# Patient Record
Sex: Female | Born: 1945 | Race: White | Hispanic: No | Smoking: Former smoker
Health system: Southern US, Community
[De-identification: ages and names within clinical notes are randomized; demographics above are authoritative.]

## PROBLEM LIST (undated history)

## (undated) DIAGNOSIS — I6783 Posterior reversible encephalopathy syndrome: Secondary | ICD-10-CM

## (undated) DIAGNOSIS — J449 Chronic obstructive pulmonary disease, unspecified: Secondary | ICD-10-CM

## (undated) DIAGNOSIS — I4891 Unspecified atrial fibrillation: Secondary | ICD-10-CM

## (undated) DIAGNOSIS — I10 Essential (primary) hypertension: Secondary | ICD-10-CM

## (undated) DIAGNOSIS — I639 Cerebral infarction, unspecified: Secondary | ICD-10-CM

## (undated) DIAGNOSIS — C801 Malignant (primary) neoplasm, unspecified: Secondary | ICD-10-CM

## (undated) DIAGNOSIS — J45909 Unspecified asthma, uncomplicated: Secondary | ICD-10-CM

## (undated) DIAGNOSIS — I499 Cardiac arrhythmia, unspecified: Secondary | ICD-10-CM

## (undated) HISTORY — PX: TONSILLECTOMY: SUR1361

## (undated) HISTORY — PX: OTHER SURGICAL HISTORY: SHX169

---

## 2000-02-03 ENCOUNTER — Emergency Department (HOSPITAL_COMMUNITY): Admission: EM | Admit: 2000-02-03 | Discharge: 2000-02-03 | Payer: Self-pay | Admitting: Emergency Medicine

## 2000-02-03 ENCOUNTER — Encounter: Payer: Self-pay | Admitting: Emergency Medicine

## 2002-06-13 ENCOUNTER — Emergency Department (HOSPITAL_COMMUNITY): Admission: EM | Admit: 2002-06-13 | Discharge: 2002-06-14 | Payer: Self-pay | Admitting: Emergency Medicine

## 2002-06-14 ENCOUNTER — Encounter: Payer: Self-pay | Admitting: Emergency Medicine

## 2004-07-24 ENCOUNTER — Ambulatory Visit (HOSPITAL_COMMUNITY): Admission: RE | Admit: 2004-07-24 | Discharge: 2004-07-24 | Payer: Self-pay | Admitting: Gastroenterology

## 2006-06-18 ENCOUNTER — Other Ambulatory Visit: Admission: RE | Admit: 2006-06-18 | Discharge: 2006-06-18 | Payer: Self-pay | Admitting: Family Medicine

## 2007-07-08 ENCOUNTER — Other Ambulatory Visit: Admission: RE | Admit: 2007-07-08 | Discharge: 2007-07-08 | Payer: Self-pay | Admitting: Family Medicine

## 2010-05-30 ENCOUNTER — Emergency Department (HOSPITAL_BASED_OUTPATIENT_CLINIC_OR_DEPARTMENT_OTHER)
Admission: EM | Admit: 2010-05-30 | Discharge: 2010-05-30 | Disposition: A | Discharge status: Home / Self Care | Payer: BC Managed Care – PPO | Attending: Emergency Medicine | Admitting: Emergency Medicine

## 2010-05-30 ENCOUNTER — Emergency Department (INDEPENDENT_AMBULATORY_CARE_PROVIDER_SITE_OTHER): Payer: BC Managed Care – PPO

## 2010-05-30 DIAGNOSIS — R509 Fever, unspecified: Secondary | ICD-10-CM

## 2010-05-30 DIAGNOSIS — R0609 Other forms of dyspnea: Secondary | ICD-10-CM | POA: Insufficient documentation

## 2010-05-30 DIAGNOSIS — I10 Essential (primary) hypertension: Secondary | ICD-10-CM | POA: Insufficient documentation

## 2010-05-30 DIAGNOSIS — R0989 Other specified symptoms and signs involving the circulatory and respiratory systems: Secondary | ICD-10-CM | POA: Insufficient documentation

## 2010-05-30 DIAGNOSIS — Z79899 Other long term (current) drug therapy: Secondary | ICD-10-CM | POA: Insufficient documentation

## 2010-05-30 DIAGNOSIS — R0602 Shortness of breath: Secondary | ICD-10-CM

## 2010-05-30 DIAGNOSIS — J069 Acute upper respiratory infection, unspecified: Secondary | ICD-10-CM | POA: Insufficient documentation

## 2010-05-30 DIAGNOSIS — R059 Cough, unspecified: Secondary | ICD-10-CM

## 2010-05-30 DIAGNOSIS — J45901 Unspecified asthma with (acute) exacerbation: Secondary | ICD-10-CM | POA: Insufficient documentation

## 2010-05-30 DIAGNOSIS — I4891 Unspecified atrial fibrillation: Secondary | ICD-10-CM | POA: Insufficient documentation

## 2010-05-30 DIAGNOSIS — R05 Cough: Secondary | ICD-10-CM

## 2010-05-30 DIAGNOSIS — J40 Bronchitis, not specified as acute or chronic: Secondary | ICD-10-CM | POA: Insufficient documentation

## 2010-08-17 NOTE — Op Note (Signed)
NAMELAIKEN, SANDY              ACCOUNT NO.:  [id number]   MEDICAL RECORD NO.:  [id number]          PATIENT TYPE:  AMB   LOCATION:  ENDO                         FACILITY:  [facility]   PHYSICIAN:  John C. Madilyn Fireman, M.D.    DATE OF BIRTH:  1945/11/01   DATE OF PROCEDURE:  07/24/2004  DATE OF DISCHARGE:                                 OPERATIVE REPORT   INDICATIONS FOR PROCEDURE:  Average risk colon cancer screening.   PROCEDURE:  The patient was placed in the left lateral decubitus position  and placed on pulse monitor with continuous low-flow oxygen delivered by  nasal cannula. She was sedated with 62.5 mcg IV fentanyl and 7 milligrams IV  Versed. Olympus video colonoscope was inserted into the rectum and advanced  to cecum, confirmed by transillumination of McBurney's point and  visualization of ileocecal valve and appendiceal orifice. Prep was  excellent.  The cecum, ascending, transverse, descending, sigmoid and rectum  all appeared normal with no masses, polyps, diverticula or other mucosal  abnormalities. Scope was then withdrawn and the patient returned to the  recovery room in stable condition. She tolerated the procedure well. There  were no immediate complications.   IMPRESSION:  Normal colonoscopy.   PLAN:  Next colonoscopy within 10 years. Consider flexible sigmoidoscopy in  five years.      JCH/MEDQ  D:  07/24/2004  T:  07/24/2004  Job:  [id number]   cc:   Molly Maduro L. Foy [place], M.D.  [address] [place]  [place] [address]  Fax: [phone]

## 2013-06-02 ENCOUNTER — Other Ambulatory Visit: Payer: Self-pay | Admitting: Family Medicine

## 2013-06-02 DIAGNOSIS — J329 Chronic sinusitis, unspecified: Secondary | ICD-10-CM

## 2013-06-07 ENCOUNTER — Ambulatory Visit
Admission: RE | Admit: 2013-06-07 | Discharge: 2013-06-07 | Disposition: A | Discharge status: Home / Self Care | Payer: BC Managed Care – PPO | Source: Ambulatory Visit | Attending: Family Medicine | Admitting: Family Medicine

## 2013-06-07 DIAGNOSIS — J329 Chronic sinusitis, unspecified: Secondary | ICD-10-CM

## 2013-08-19 ENCOUNTER — Inpatient Hospital Stay (HOSPITAL_COMMUNITY)
Admission: EM | Admit: 2013-08-19 | Discharge: 2013-08-20 | DRG: 192 | Disposition: A | Discharge status: Home / Self Care | Payer: BC Managed Care – PPO | Attending: Internal Medicine | Admitting: Internal Medicine

## 2013-08-19 ENCOUNTER — Encounter (HOSPITAL_COMMUNITY): Payer: Self-pay | Admitting: Emergency Medicine

## 2013-08-19 ENCOUNTER — Emergency Department (HOSPITAL_COMMUNITY): Payer: BC Managed Care – PPO

## 2013-08-19 DIAGNOSIS — IMO0002 Reserved for concepts with insufficient information to code with codable children: Secondary | ICD-10-CM | POA: Diagnosis not present

## 2013-08-19 DIAGNOSIS — J441 Chronic obstructive pulmonary disease with (acute) exacerbation: Secondary | ICD-10-CM | POA: Diagnosis present

## 2013-08-19 DIAGNOSIS — J40 Bronchitis, not specified as acute or chronic: Secondary | ICD-10-CM

## 2013-08-19 DIAGNOSIS — Z87891 Personal history of nicotine dependence: Secondary | ICD-10-CM

## 2013-08-19 DIAGNOSIS — R9431 Abnormal electrocardiogram [ECG] [EKG]: Secondary | ICD-10-CM | POA: Diagnosis present

## 2013-08-19 DIAGNOSIS — R0602 Shortness of breath: Secondary | ICD-10-CM | POA: Diagnosis present

## 2013-08-19 DIAGNOSIS — R509 Fever, unspecified: Secondary | ICD-10-CM | POA: Diagnosis present

## 2013-08-19 DIAGNOSIS — Z79899 Other long term (current) drug therapy: Secondary | ICD-10-CM | POA: Diagnosis not present

## 2013-08-19 DIAGNOSIS — J45901 Unspecified asthma with (acute) exacerbation: Principal | ICD-10-CM | POA: Diagnosis present

## 2013-08-19 DIAGNOSIS — J449 Chronic obstructive pulmonary disease, unspecified: Secondary | ICD-10-CM | POA: Diagnosis present

## 2013-08-19 HISTORY — DX: Chronic obstructive pulmonary disease, unspecified: J44.9

## 2013-08-19 HISTORY — DX: Unspecified asthma, uncomplicated: J45.909

## 2013-08-19 HISTORY — DX: Cardiac arrhythmia, unspecified: I49.9

## 2013-08-19 LAB — COMPREHENSIVE METABOLIC PANEL
ALT: 28 U/L (ref 0–35)
AST: 27 U/L (ref 0–37)
Albumin: 4.4 g/dL (ref 3.5–5.2)
Alkaline Phosphatase: 69 U/L (ref 39–117)
BUN: 12 mg/dL (ref 6–23)
CO2: 25 mEq/L (ref 19–32)
Calcium: 9.9 mg/dL (ref 8.4–10.5)
Chloride: 98 mEq/L (ref 96–112)
Creatinine, Ser: 0.79 mg/dL (ref 0.50–1.10)
GFR calc Af Amer: >90 mL/min (ref >90)
GFR calc non Af Amer: 86 mL/min — ABNORMAL LOW (ref >90)
Glucose, Bld: 117 mg/dL — ABNORMAL HIGH (ref 70–99)
Potassium: 4.4 mEq/L (ref 3.7–5.3)
Sodium: 139 mEq/L (ref 137–147)
Total Bilirubin: 0.6 mg/dL (ref 0.3–1.2)
Total Protein: 7.4 g/dL (ref 6.0–8.3)

## 2013-08-19 LAB — CBC WITH DIFFERENTIAL/PLATELET
Basophils Absolute: 0 10*3/uL (ref 0.0–0.1)
Basophils Relative: 0 % (ref 0–1)
Eosinophils Absolute: 0 10*3/uL (ref 0.0–0.7)
Eosinophils Relative: 0 % (ref 0–5)
HCT: 40.2 % (ref 36.0–46.0)
Hemoglobin: 14.3 g/dL (ref 12.0–15.0)
Lymphocytes Relative: 5 % — ABNORMAL LOW (ref 12–46)
Lymphs Abs: 0.7 10*3/uL (ref 0.7–4.0)
MCH: 32.8 pg (ref 26.0–34.0)
MCHC: 35.6 g/dL (ref 30.0–36.0)
MCV: 92.2 fL (ref 78.0–100.0)
Monocytes Absolute: 0.6 10*3/uL (ref 0.1–1.0)
Monocytes Relative: 5 % (ref 3–12)
Neutro Abs: 11.9 10*3/uL — ABNORMAL HIGH (ref 1.7–7.7)
Neutrophils Relative %: 90 % — ABNORMAL HIGH (ref 43–77)
Platelets: 275 10*3/uL (ref 150–400)
RBC: 4.36 MIL/uL (ref 3.87–5.11)
RDW: 11.9 % (ref 11.5–15.5)
WBC: 13.3 10*3/uL — ABNORMAL HIGH (ref 4.0–10.5)

## 2013-08-19 LAB — TROPONIN I: Troponin I: <0.3 ng/mL (ref <0.30)

## 2013-08-19 LAB — D-DIMER, QUANTITATIVE (NOT AT ARMC): D-Dimer, Quant: <0.27 ug/mL-FEU (ref 0.00–0.48)

## 2013-08-19 LAB — I-STAT TROPONIN, ED: Troponin i, poc: 0 ng/mL (ref 0.00–0.08)

## 2013-08-19 LAB — PRO B NATRIURETIC PEPTIDE: Pro B Natriuretic peptide (BNP): 153.2 pg/mL — ABNORMAL HIGH (ref 0–125)

## 2013-08-19 MED ORDER — LOSARTAN POTASSIUM 50 MG PO TABS
50.0000 mg | ORAL_TABLET | Freq: Every day | ORAL | Status: DC
  Administered 2013-08-19 – 2013-08-20 (×2): 50 mg via ORAL
  Filled 2013-08-19 (×2): qty 1

## 2013-08-19 MED ORDER — ACETAMINOPHEN 325 MG PO TABS: 650.0000 mg | ORAL_TABLET | Freq: Four times a day (QID) | ORAL | Status: DC | PRN

## 2013-08-19 MED ORDER — ENOXAPARIN SODIUM 40 MG/0.4ML ~~LOC~~ SOLN
40.0000 mg | SUBCUTANEOUS | Status: DC
  Administered 2013-08-19: 40 mg via SUBCUTANEOUS
  Filled 2013-08-19 (×2): qty 0.4

## 2013-08-19 MED ORDER — ACETAMINOPHEN 650 MG RE SUPP: 650.0000 mg | Freq: Four times a day (QID) | RECTAL | Status: DC | PRN

## 2013-08-19 MED ORDER — ONDANSETRON HCL 4 MG/2ML IJ SOLN: 4.0000 mg | Freq: Four times a day (QID) | INTRAMUSCULAR | Status: DC | PRN

## 2013-08-19 MED ORDER — LOSARTAN POTASSIUM-HCTZ 50-12.5 MG PO TABS: 1.0000 | ORAL_TABLET | Freq: Every day | ORAL | Status: DC

## 2013-08-19 MED ORDER — HYDROCHLOROTHIAZIDE 12.5 MG PO CAPS
12.5000 mg | ORAL_CAPSULE | Freq: Every day | ORAL | Status: DC
Start: 2013-08-19 — End: 2013-08-20
  Administered 2013-08-19 – 2013-08-20 (×2): 12.5 mg via ORAL
  Filled 2013-08-19 (×2): qty 1

## 2013-08-19 MED ORDER — SODIUM CHLORIDE 0.9 % IV SOLN
INTRAVENOUS | Status: DC
  Administered 2013-08-19 – 2013-08-20 (×2): via INTRAVENOUS

## 2013-08-19 MED ORDER — OSELTAMIVIR PHOSPHATE 75 MG PO CAPS
75.0000 mg | ORAL_CAPSULE | Freq: Two times a day (BID) | ORAL | Status: DC
  Administered 2013-08-19 – 2013-08-20 (×2): 75 mg via ORAL
  Filled 2013-08-19 (×3): qty 1

## 2013-08-19 MED ORDER — PREDNISONE 50 MG PO TABS
60.0000 mg | ORAL_TABLET | Freq: Every day | ORAL | Status: DC
  Administered 2013-08-19: 60 mg via ORAL
  Filled 2013-08-19: qty 3
  Filled 2013-08-19: qty 1

## 2013-08-19 MED ORDER — ACETAMINOPHEN 325 MG PO TABS
650.0000 mg | ORAL_TABLET | Freq: Once | ORAL | Status: AC
  Administered 2013-08-19: 650 mg via ORAL
  Filled 2013-08-19: qty 2

## 2013-08-19 MED ORDER — SODIUM CHLORIDE 0.9 % IV BOLUS (SEPSIS)
500.0000 mL | Freq: Once | INTRAVENOUS | Status: AC
  Administered 2013-08-19: 500 mL via INTRAVENOUS

## 2013-08-19 MED ORDER — OXYCODONE HCL 5 MG PO TABS: 5.0000 mg | ORAL_TABLET | ORAL | Status: DC | PRN

## 2013-08-19 MED ORDER — ONDANSETRON HCL 4 MG PO TABS: 4.0000 mg | ORAL_TABLET | Freq: Four times a day (QID) | ORAL | Status: DC | PRN

## 2013-08-19 MED ORDER — ADULT MULTIVITAMIN W/MINERALS CH
1.0000 | ORAL_TABLET | Freq: Every day | ORAL | Status: DC
  Administered 2013-08-19 – 2013-08-20 (×2): 1 via ORAL
  Filled 2013-08-19 (×2): qty 1

## 2013-08-19 MED ORDER — OSELTAMIVIR PHOSPHATE 75 MG PO CAPS
75.0000 mg | ORAL_CAPSULE | Freq: Once | ORAL | Status: AC
  Administered 2013-08-19: 75 mg via ORAL
  Filled 2013-08-19: qty 1

## 2013-08-19 MED ORDER — SENNOSIDES-DOCUSATE SODIUM 8.6-50 MG PO TABS: 1.0000 | ORAL_TABLET | Freq: Every evening | ORAL | Status: DC | PRN

## 2013-08-19 MED ORDER — SODIUM CHLORIDE 0.9 % IJ SOLN: 3.0000 mL | Freq: Two times a day (BID) | INTRAMUSCULAR | Status: DC

## 2013-08-19 MED ORDER — LEVOFLOXACIN 500 MG PO TABS
500.0000 mg | ORAL_TABLET | Freq: Once | ORAL | Status: AC
  Administered 2013-08-19: 500 mg via ORAL
  Filled 2013-08-19: qty 1

## 2013-08-19 MED ORDER — MOMETASONE FURO-FORMOTEROL FUM 100-5 MCG/ACT IN AERO
2.0000 | INHALATION_SPRAY | Freq: Two times a day (BID) | RESPIRATORY_TRACT | Status: DC
  Administered 2013-08-19 – 2013-08-20 (×2): 2 via RESPIRATORY_TRACT
  Filled 2013-08-19: qty 8.8

## 2013-08-19 MED ORDER — ZOLPIDEM TARTRATE 5 MG PO TABS
5.0000 mg | ORAL_TABLET | Freq: Once | ORAL | Status: AC
  Administered 2013-08-19: 5 mg via ORAL
  Filled 2013-08-19: qty 1

## 2013-08-19 MED ORDER — ALBUTEROL SULFATE (2.5 MG/3ML) 0.083% IN NEBU
5.0000 mg | INHALATION_SOLUTION | Freq: Once | RESPIRATORY_TRACT | Status: AC
  Administered 2013-08-19: 5 mg via RESPIRATORY_TRACT
  Filled 2013-08-19: qty 6

## 2013-08-19 MED ORDER — PRAMIPEXOLE DIHYDROCHLORIDE 0.25 MG PO TABS
0.2500 mg | ORAL_TABLET | Freq: Every day | ORAL | Status: DC
  Administered 2013-08-19 – 2013-08-20 (×2): 0.25 mg via ORAL
  Filled 2013-08-19 (×2): qty 1

## 2013-08-19 MED ORDER — VITAMIN C 500 MG PO TABS
500.0000 mg | ORAL_TABLET | Freq: Every day | ORAL | Status: DC
  Administered 2013-08-19 – 2013-08-20 (×2): 500 mg via ORAL
  Filled 2013-08-19 (×2): qty 1

## 2013-08-19 MED ORDER — PREDNISONE 50 MG PO TABS
60.0000 mg | ORAL_TABLET | Freq: Every day | ORAL | Status: DC
  Administered 2013-08-20: 60 mg via ORAL
  Filled 2013-08-19 (×2): qty 1

## 2013-08-19 MED ORDER — DILTIAZEM HCL ER COATED BEADS 300 MG PO CP24
300.0000 mg | ORAL_CAPSULE | Freq: Every day | ORAL | Status: DC
  Administered 2013-08-19 – 2013-08-20 (×2): 300 mg via ORAL
  Filled 2013-08-19 (×2): qty 1

## 2013-08-19 NOTE — Progress Notes (Signed)
  CARE MANAGEMENT ED NOTE 08/19/2013  Patient:  Kim Blankenship, Kim Blankenship   Account Number:  [id number]  Date Initiated:  08/19/2013  Documentation initiated by:  Livia Snellen  Subjective/Objective Assessment:   Patient presents to Ed with shortness of breath, chest pain     Subjective/Objective Assessment Detail:   Patient with pmh xof COPD.     Action/Plan:   Action/Plan Detail:   Anticipated DC Date:       Status Recommendation to Physician:   Result of Recommendation:    Other ED [place]  Other  PCP issues    Choice offered to / List presented to:            Status of service:  Completed, signed off  ED Comments:   ED Comments Detail:  EDCM spoke to patient at bedside.  Patient confirms her pcp is Dr. Briscoe Deutscher of [place] in [place]. Patient reports she does not wear oxygen at home.  System updated.

## 2013-08-19 NOTE — ED Notes (Signed)
Secretary on 4E states nurse unavailable to take report at this time

## 2013-08-19 NOTE — H&P (Signed)
[facility]          History and Physical    PCP:   FRIED, Jaymes Graff, MD   Chief Complaint:  Shortness of breath, fever, chills, body aches  HPI: Patient is a 68 year old woman with past medical history only significant for mild COPD. She works at [employer]. Today while at work, she experienced onset of shortness of breath, cough with productive sputum, bodyaches, chills as well as a temperature of 100.6. Also had some fleeting chest pain that she associates with shortness of breath. Chest x-ray done emergency department was unremarkable. We have been asked to admit her for further evaluation and management.  Allergies:   Allergies  Allergen Reactions  . No Known Allergies       Past Medical History  Diagnosis Date  . Asthma   . COPD (chronic obstructive pulmonary disease)   . Irregular heart rate     Past Surgical History  Procedure Laterality Date  . Left foot reconstruction    . Tonsillectomy    . Cesarean section      Prior to Admission medications   Medication Sig Start Date End Date Taking? Authorizing Provider  diltiazem (CARDIZEM CD) 300 MG 24 hr capsule Take 300 mg by mouth daily.   Yes Historical Provider, MD  losartan-hydrochlorothiazide (HYZAAR) 50-12.5 MG per tablet Take 1 tablet by mouth daily.   Yes Historical Provider, MD  mometasone-formoterol (DULERA) 100-5 MCG/ACT AERO Inhale 2 puffs into the lungs 2 (two) times daily.   Yes Historical Provider, MD  Multiple Vitamin (MULTIVITAMIN) tablet Take 1 tablet by mouth daily.   Yes Historical Provider, MD  pramipexole (MIRAPEX) 0.25 MG tablet Take 0.25 mg by mouth daily with supper.   Yes Historical Provider, MD  vitamin C (ASCORBIC ACID) 500 MG tablet Take 500 mg by mouth daily.   Yes Historical Provider, MD    Social History:  reports that she has quit smoking. Her smoking use included Cigarettes. She smoked 0.00 packs per day. She has never used smokeless tobacco. She reports that she  does not drink alcohol or use illicit drugs.  History reviewed. No pertinent family history.  Review of Systems:  Constitutional: Denies appetite change and fatigue.  HEENT: Denies photophobia, eye pain, redness, hearing loss, ear pain, congestion, sore throat, rhinorrhea, sneezing, mouth sores, trouble swallowing, neck pain, neck stiffness and tinnitus.   Respiratory: Positive for SOB, DOE, cough, chest tightness,  and wheezing.   Cardiovascular: Denies chest pain, palpitations and leg swelling.  Gastrointestinal: Denies nausea, vomiting, abdominal pain, diarrhea, constipation, blood in stool and abdominal distention.  Genitourinary: Denies dysuria, urgency, frequency, hematuria, flank pain and difficulty urinating.  Endocrine: Denies: hot or cold intolerance, sweats, changes in hair or nails, polyuria, polydipsia. Musculoskeletal: Denies myalgias, back pain, joint swelling, arthralgias and gait problem.  Skin: Denies pallor, rash and wound.  Neurological: Denies dizziness, seizures, syncope, weakness, light-headedness, numbness and headaches.  Hematological: Denies adenopathy. Easy bruising, personal or family bleeding history  Psychiatric/Behavioral: Denies suicidal ideation, mood changes, confusion, nervousness, sleep disturbance and agitation   Physical Exam: Blood pressure 131/58, pulse 88, temperature 98.3 F (36.8 C), temperature source Oral, resp. rate 18, height _0  (1.651 m), weight 75 kg (165 lb 5.5 oz), SpO2 97.00%. General: Alert, awake, oriented x3. HEENT: Normocephalic, atraumatic, pupils equal round reactive to light, she is intact, moist membranes. Neck: Supple, no JVD, no lymphadenopathy, no bruits, no goiter.  Cardiovascular: Regular rate and rhythm, no murmurs, rubs or gallops auscultated. Lungs: Clear to auscultation bilaterally, no wheezing. Abdomen: Soft, nontender, nondistended, positive bowel sounds, no masses organomegaly noted. Extremities: No clubbing,  cyanosis or edema, positive pedal pulses. Neurologic: Grossly intact and nonfocal.  Labs on Admission:  Results for orders placed during the hospital encounter of 08/19/13 (from the past 48 hour(s))  CBC WITH DIFFERENTIAL     Status: Abnormal   Collection Time    08/19/13 12:25 PM      Result Value Ref Range   WBC 13.3 (*) 4.0 - 10.5 K/uL   RBC 4.36  3.87 - 5.11 MIL/uL   Hemoglobin 14.3  12.0 - 15.0 g/dL   HCT 40.2  36.0 - 46.0 %   MCV 92.2  78.0 - 100.0 fL   MCH 32.8  26.0 - 34.0 pg   MCHC 35.6  30.0 - 36.0 g/dL   RDW 11.9  11.5 - 15.5 %   Platelets 275  150 - 400 K/uL   Neutrophils Relative % 90 (*) 43 - 77 %   Neutro Abs 11.9 (*) 1.7 - 7.7 K/uL   Lymphocytes Relative 5 (*) 12 - 46 %   Lymphs Abs 0.7  0.7 - 4.0 K/uL   Monocytes Relative 5  3 - 12 %   Monocytes Absolute 0.6  0.1 - 1.0 K/uL   Eosinophils Relative 0  0 - 5 %   Eosinophils Absolute 0.0  0.0 - 0.7 K/uL   Basophils Relative 0  0 - 1 %   Basophils Absolute 0.0  0.0 - 0.1 K/uL  COMPREHENSIVE METABOLIC PANEL     Status: Abnormal   Collection Time    08/19/13 12:25 PM      Result Value Ref Range   Sodium 139  137 - 147 mEq/L   Potassium 4.4  3.7 - 5.3 mEq/L   Chloride 98  96 - 112 mEq/L   CO2 25  19 - 32 mEq/L   Glucose, Bld 117 (*) 70 - 99 mg/dL   BUN 12  6 - 23 mg/dL   Creatinine, Ser 0.79  0.50 - 1.10 mg/dL   Calcium 9.9  8.4 - 10.5 mg/dL   Total Protein 7.4  6.0 - 8.3 g/dL   Albumin 4.4  3.5 - 5.2 g/dL   AST 27  0 - 37 U/L   ALT 28  0 - 35 U/L   Alkaline Phosphatase 69  39 - 117 U/L   Total Bilirubin 0.6  0.3 - 1.2 mg/dL   GFR calc non Af Amer 86 (*) >90 mL/min   GFR calc Af Amer >90  >90 mL/min   Comment: (NOTE)     The eGFR has been calculated using the CKD EPI equation.     This calculation has not been validated in all clinical situations.     eGFR's persistently <90 mL/min signify possible Chronic Kidney     Disease.  D-DIMER, QUANTITATIVE     Status: None   Collection Time    08/19/13 12:25 PM       Result Value Ref Range   D-Dimer, Quant <0.27  0.00 - 0.48 ug/mL-FEU   Comment:            AT THE INHOUSE ESTABLISHED CUTOFF     VALUE OF 0.48 ug/mL FEU,     THIS ASSAY HAS BEEN DOCUMENTED     IN THE LITERATURE TO HAVE     A SENSITIVITY AND NEGATIVE  PREDICTIVE VALUE OF AT LEAST     98 TO 99%.  THE TEST RESULT     SHOULD BE CORRELATED WITH     AN ASSESSMENT OF THE CLINICAL     PROBABILITY OF DVT / VTE.  PRO B NATRIURETIC PEPTIDE     Status: Abnormal   Collection Time    08/19/13 12:25 PM      Result Value Ref Range   Pro B Natriuretic peptide (BNP) 153.2 (*) 0 - 125 pg/mL  I-STAT TROPOININ, ED     Status: None   Collection Time    08/19/13 12:35 PM      Result Value Ref Range   Troponin i, poc 0.00  0.00 - 0.08 ng/mL   Comment 3            Comment: Due to the release kinetics of cTnI,     a negative result within the first hours     of the onset of symptoms does not rule out     myocardial infarction with certainty.     If myocardial infarction is still suspected,     repeat the test at appropriate intervals.    Radiological Exams on Admission: Dg Chest 2 View  08/19/2013   CLINICAL DATA:  Chest pain and shortness of breath.  EXAM: CHEST  2 VIEW  COMPARISON:  02/27/2011  FINDINGS: The cardiac silhouette, mediastinal and hilar contours are within normal limits and stable. There is mild tortuosity of the thoracic aorta. Low lung volumes with vascular crowding and streaky basilar atelectasis. No infiltrates or effusions. The bony thorax is intact.  IMPRESSION: Slightly low lung volumes with vascular crowding and basilar atelectasis. No definite infiltrates.   Electronically Signed   By: Kalman Jewels M.D.   On: 08/19/2013 13:29    Assessment/Plan Principal Problem:   COPD exacerbation Active Problems:   COPD (chronic obstructive pulmonary disease)   Abnormal EKG   Fever, unspecified   COPD with acute exacerbation -Already markedly improved the treatment given in  the emergency department. -Will continue Dulera, nebs, oxygen support as needed, prednisone taper. -Chest x-ray is negative for pneumonia, was given a dose of Levaquin in the emergency department, do not plan on continuing antibiotics at this time. -Flu PCR ordered, will empirically treat with Tamiflu.  Abnormal EKG -ST depressions noted in the inferior lateral leads. -Patient only had fleeting chest pain that was present when she was extremely short of breath this afternoon. -Will rule out for acute coronary syndrome. -She has no risk factors for cardiac disease. -Will recheck EKG tomorrow morning, if abnormalities resolved with improvement in respiratory status and troponins are negative, would not recommend further cardiac followup at this point.  DVT prophylaxis -Lovenox  CODE STATUS -Full code   Time Spent on Admission: 70 minutes  [facility] Pager: [phone] 08/19/2013, 6:27 PM

## 2013-08-19 NOTE — ED Provider Notes (Signed)
CSN: [id number]     Arrival date & time 08/19/13  1141 History   First MD Initiated Contact with Patient 08/19/13 1158     Chief Complaint  Patient presents with  . Shortness of Breath  . Chest Pain  . Generalized Body Aches     (Consider location/radiation/quality/duration/timing/severity/associated sxs/prior Treatment) HPI 45 ;y.o. Female with history of copd complains of increasing dyspnea for several days.  She had sudden worsening today at work and felt myalgias and noted increased cough  She is unable to say if there is change is color of sputum.  She has felt like she had a fever today with chills.  SHe had an episode of thirty minutes of chest pain midsternum today.   Past Medical History  Diagnosis Date  . Asthma   . COPD (chronic obstructive pulmonary disease)   . Irregular heart rate    History reviewed. No pertinent past surgical history. History reviewed. No pertinent family history. History  Substance Use Topics  . Smoking status: Former Research scientist (life sciences)  . Smokeless tobacco: Not on file  . Alcohol Use: No   OB History   Grav Para Term Preterm Abortions TAB SAB Ect Mult Living                 Review of Systems  All other systems reviewed and are negative.     Allergies  No known allergies  Home Medications   Prior to Admission medications   Not on File   BP 130/75  Pulse 101  Temp(Src) 99.4 F (37.4 C) (Oral)  Resp 25  SpO2 91% Physical Exam  Nursing note and vitals reviewed. Constitutional: She is oriented to person, place, and time. She appears well-developed and well-nourished.  HENT:  Head: Normocephalic and atraumatic.  Right Ear: External ear normal.  Left Ear: External ear normal.  Eyes: Conjunctivae and EOM are normal. Pupils are equal, round, and reactive to light.  Neck: Normal range of motion. Neck supple.  Cardiovascular: Tachycardia present.   Pulmonary/Chest: Effort normal and breath sounds normal.  Abdominal: Soft. Bowel sounds are  normal.  Musculoskeletal: Normal range of motion.  Neurological: She is alert and oriented to person, place, and time. She has normal reflexes.  Skin: Skin is warm and dry.  Psychiatric: She has a normal mood and affect. Her behavior is normal. Judgment and thought content normal.    ED Course  Procedures (including critical care time) Labs Review Labs Reviewed  CBC WITH DIFFERENTIAL  COMPREHENSIVE METABOLIC PANEL  D-DIMER, QUANTITATIVE  PRO B NATRIURETIC PEPTIDE  I-STAT TROPOININ, ED    Imaging Review No results found.   EKG Interpretation   Date/Time:  Thursday Aug 19 2013 11:52:58 EDT Ventricular Rate:  100 PR Interval:  123 QRS Duration: 99 QT Interval:  344 QTC Calculation: 444 R Axis:   87 Text Interpretation:  Sinus tachycardia Borderline right axis deviation  Non-specific ST-t changes Inferolateral leads Confirmed by Joseline Mccampbell MD,  Syniyah Bourne ([id number]) on 08/19/2013 11:58:07 AM      MDM   Final diagnoses:  COPD exacerbation  Bronchitis  Abnormal EKG    Patient improved here after neb, steroids, levaquin but has developed temp to 100.9 and remains mildly tachycardiac.  Plan fluid bolus, continue nebs, and admission for further treatment.   Discussed with Dr. Jerilee Hoh and will obtain pcr for influenza and treat emprically with tamiflu.     Shaune Pollack, MD 08/20/13 [phone]

## 2013-08-19 NOTE — ED Notes (Signed)
Pt reports sob for past several days since change in inhalers. States she had chest pain at 0900 this am that resolved after an hour. Pt 88% on RA, RR 30. Pt also reports generalized body aches and feeling "feverish." lungs clear, reports cough for several months.

## 2013-08-20 DIAGNOSIS — J441 Chronic obstructive pulmonary disease with (acute) exacerbation: Secondary | ICD-10-CM | POA: Diagnosis not present

## 2013-08-20 DIAGNOSIS — IMO0002 Reserved for concepts with insufficient information to code with codable children: Secondary | ICD-10-CM | POA: Diagnosis not present

## 2013-08-20 DIAGNOSIS — J45901 Unspecified asthma with (acute) exacerbation: Secondary | ICD-10-CM | POA: Diagnosis not present

## 2013-08-20 DIAGNOSIS — I369 Nonrheumatic tricuspid valve disorder, unspecified: Secondary | ICD-10-CM

## 2013-08-20 DIAGNOSIS — R9431 Abnormal electrocardiogram [ECG] [EKG]: Secondary | ICD-10-CM | POA: Diagnosis not present

## 2013-08-20 DIAGNOSIS — R509 Fever, unspecified: Secondary | ICD-10-CM

## 2013-08-20 DIAGNOSIS — J40 Bronchitis, not specified as acute or chronic: Secondary | ICD-10-CM

## 2013-08-20 LAB — INFLUENZA PANEL BY PCR (TYPE A & B)
H1N1 flu by pcr: NOT DETECTED
Influenza A By PCR: NEGATIVE
Influenza B By PCR: NEGATIVE

## 2013-08-20 LAB — BASIC METABOLIC PANEL
BUN: 13 mg/dL (ref 6–23)
CO2: 25 mEq/L (ref 19–32)
Calcium: 9.4 mg/dL (ref 8.4–10.5)
Chloride: 99 mEq/L (ref 96–112)
Creatinine, Ser: 0.73 mg/dL (ref 0.50–1.10)
GFR calc Af Amer: >90 mL/min (ref >90)
GFR calc non Af Amer: 88 mL/min — ABNORMAL LOW (ref >90)
Glucose, Bld: 172 mg/dL — ABNORMAL HIGH (ref 70–99)
Potassium: 4 mEq/L (ref 3.7–5.3)
Sodium: 137 mEq/L (ref 137–147)

## 2013-08-20 LAB — TROPONIN I
Troponin I: <0.3 ng/mL (ref <0.30)
Troponin I: <0.3 ng/mL (ref <0.30)

## 2013-08-20 LAB — CBC
HCT: 37.1 % (ref 36.0–46.0)
Hemoglobin: 13 g/dL (ref 12.0–15.0)
MCH: 32.3 pg (ref 26.0–34.0)
MCHC: 35 g/dL (ref 30.0–36.0)
MCV: 92.3 fL (ref 78.0–100.0)
Platelets: 270 10*3/uL (ref 150–400)
RBC: 4.02 MIL/uL (ref 3.87–5.11)
RDW: 11.9 % (ref 11.5–15.5)
WBC: 17.6 10*3/uL — ABNORMAL HIGH (ref 4.0–10.5)

## 2013-08-20 MED ORDER — LEVOFLOXACIN 500 MG PO TABS
500.0000 mg | ORAL_TABLET | Freq: Every day | ORAL | Status: DC
Start: 2013-08-20 — End: 2013-08-20
  Administered 2013-08-20: 500 mg via ORAL
  Filled 2013-08-20: qty 1

## 2013-08-20 MED ORDER — GUAIFENESIN ER 600 MG PO TB12: 600.0000 mg | ORAL_TABLET | Freq: Two times a day (BID) | ORAL | Status: DC | PRN

## 2013-08-20 MED ORDER — PREDNISONE 20 MG PO TABS: 10.0000 mg | ORAL_TABLET | Freq: Every day | ORAL | Status: DC

## 2013-08-20 MED ORDER — LEVOFLOXACIN 500 MG PO TABS: 500.0000 mg | ORAL_TABLET | Freq: Every day | ORAL | Status: DC

## 2013-08-20 MED ORDER — ALBUTEROL SULFATE HFA 108 (90 BASE) MCG/ACT IN AERS: 2.0000 | INHALATION_SPRAY | Freq: Four times a day (QID) | RESPIRATORY_TRACT | Status: DC | PRN

## 2013-08-20 NOTE — Discharge Summary (Signed)
Physician Discharge Summary  Kim Blankenship JKD:[id number] DOB: 06-30-1945 DOA: 08/19/2013  PCP: Abigail Miyamoto, MD  Admit date: 08/19/2013 Discharge date: 08/20/2013  Time spent: 55minutes  Recommendations for Outpatient Follow-up:  1. PCP in 1 week  Discharge Diagnoses:  Principal Problem:   COPD exacerbation Active Problems:   COPD (chronic obstructive pulmonary disease)   Abnormal EKG   Fever, unspecified   Discharge Condition: improved  Diet recommendation: regular  Filed Weights   08/19/13 1711  Weight: 75 kg (165 lb 5.5 oz)    History of present illness:  Patient is a 68 year old woman with past medical history only significant for mild COPD. She works at [employer]. Today while at work, she experienced onset of shortness of breath, cough with productive sputum, bodyaches, chills as well as a temperature of 100.6. Also had some fleeting chest pain that she associates with shortness of breath. Chest x-ray done emergency department was unremarkable. We have been asked to admit her for further evaluation and management.    Hospital Course:  COPD with acute exacerbation  -Improving with prednisone, nebs, levaquin -quick prednisone taper at discharge and levaquin for 4 more days -CXR clear -continue dulera -d/w PCP regarding Spiriva  Abnormal EKG  -ST depressions noted in the inferior lateral leads yesterday which resolved -cardiac enzymes negative -2D ECHo done and normal with regards to EF and wall motion  Rest of chronic medical problems were stable  Procedures: Left ventricle: The cavity size was normal. Wall thickness was normal. Systolic function was normal. The estimated ejection fraction was in the range of 55% to 60%. Wall motion was normal; there were no regional wall motion abnormalities. Doppler parameters are consistent with abnormal left ventricular relaxation (grade 1 diastolic dysfunction).   Discharge Exam: Filed Vitals:   08/20/13 1306  BP:  148/73  Pulse: 81  Temp: 98.2 F (36.8 C)  Resp: 18    General: AAOx3 Cardiovascular: S1S2/RRR Respiratory: poor air movement B/l  Discharge Instructions You were cared for by a hospitalist during your hospital stay. If you have any questions about your discharge medications or the care you received while you were in the hospital after you are discharged, you can call the unit and asked to speak with the hospitalist on call if the hospitalist that took care of you is not available. Once you are discharged, your primary care physician will handle any further medical issues. Please note that NO REFILLS for any discharge medications will be authorized once you are discharged, as it is imperative that you return to your primary care physician (or establish a relationship with a primary care physician if you do not have one) for your aftercare needs so that they can reassess your need for medications and monitor your lab values.  Discharge Instructions   Diet - low sodium heart healthy    Complete by:  As directed      Increase activity slowly    Complete by:  As directed             Medication List         CARDIZEM CD 300 MG 24 hr capsule  Generic drug:  diltiazem  Take 300 mg by mouth daily.     guaiFENesin 600 MG 12 hr tablet  Commonly known as:  MUCINEX  Take 1 tablet (600 mg total) by mouth 2 (two) times daily as needed.     levofloxacin 500 MG tablet  Commonly known as:  LEVAQUIN  Take 1 tablet (500  mg total) by mouth daily. For 5days     losartan-hydrochlorothiazide 50-12.5 MG per tablet  Commonly known as:  HYZAAR  Take 1 tablet by mouth daily.     mometasone-formoterol 100-5 MCG/ACT Aero  Commonly known as:  DULERA  Inhale 2 puffs into the lungs 2 (two) times daily.     multivitamin tablet  Take 1 tablet by mouth daily.     pramipexole 0.25 MG tablet  Commonly known as:  MIRAPEX  Take 0.25 mg by mouth daily with supper.     predniSONE 20 MG tablet  Commonly  known as:  DELTASONE  Take 0.5-2 tablets (10-40 mg total) by mouth daily with breakfast. Take 40mg  for 2 days then 30mg  for 2 days then 20mg  for 2days then STOP     vitamin C 500 MG tablet  Commonly known as:  ASCORBIC ACID  Take 500 mg by mouth daily.       Allergies  Allergen Reactions  . No Known Allergies        Follow-up Information   Follow up with FRIED, ROBERT L, MD. Schedule an appointment as soon as possible for a visit in 1 week.   Specialty:  Family Medicine   Contact information:   [place] [address] [phone]        The results of significant diagnostics from this hospitalization (including imaging, microbiology, ancillary and laboratory) are listed below for reference.    Significant Diagnostic Studies: Dg Chest 2 View  08/19/2013   CLINICAL DATA:  Chest pain and shortness of breath.  EXAM: CHEST  2 VIEW  COMPARISON:  02/27/2011  FINDINGS: The cardiac silhouette, mediastinal and hilar contours are within normal limits and stable. There is mild tortuosity of the thoracic aorta. Low lung volumes with vascular crowding and streaky basilar atelectasis. No infiltrates or effusions. The bony thorax is intact.  IMPRESSION: Slightly low lung volumes with vascular crowding and basilar atelectasis. No definite infiltrates.   Electronically Signed   By: Kalman Jewels M.D.   On: 08/19/2013 13:29    Microbiology: No results found for this or any previous visit (from the past 240 hour(s)).   Labs: Basic Metabolic Panel:  Recent Labs Lab 08/19/13 1225 08/20/13 0104  NA 139 137  K 4.4 4.0  CL 98 99  CO2 25 25  GLUCOSE 117* 172*  BUN 12 13  CREATININE 0.79 0.73  CALCIUM 9.9 9.4   Liver Function Tests:  Recent Labs Lab 08/19/13 1225  AST 27  ALT 28  ALKPHOS 69  BILITOT 0.6  PROT 7.4  ALBUMIN 4.4   No results found for this basename: LIPASE, AMYLASE,  in the last 168 hours No results found for this basename: AMMONIA,  in the last 168  hours CBC:  Recent Labs Lab 08/19/13 1225 08/20/13 0104  WBC 13.3* 17.6*  NEUTROABS 11.9*  --   HGB 14.3 13.0  HCT 40.2 37.1  MCV 92.2 92.3  PLT 275 270   Cardiac Enzymes:  Recent Labs Lab 08/19/13 1904 08/20/13 0104 08/20/13 0750  TROPONINI <0.30 <0.30 <0.30   BNP: BNP (last 3 results)  Recent Labs  08/19/13 1225  PROBNP 153.2*   CBG: No results found for this basename: GLUCAP,  in the last 168 hours     Signed:  [facility] 08/20/2013, 3:39 PM

## 2013-08-20 NOTE — Progress Notes (Signed)
Patient oxygen saturation at rest 96%-RA with ambulation 93-94%-RA, post activity 94%-RA. Patient denies any distress.

## 2013-08-20 NOTE — Care Management Note (Signed)
    Page 1 of 1   08/20/2013     12:58:51 PM CARE MANAGEMENT NOTE 08/20/2013  Patient:  Kim Blankenship, Kim Blankenship   Account Number:  [id number]  Date Initiated:  08/20/2013  Documentation initiated by:  Dessa Phi  Subjective/Objective Assessment:   68 Y/O F ADMITTED W/SOB.COPD.     Action/Plan:   FROM HOME.HAS PCP,PHARMACY.   Anticipated DC Date:  08/23/2013   Anticipated DC Plan:  [place]  CM consult      Choice offered to / List presented to:             Status of service:  In process, will continue to follow Medicare Important Message given?   (If response is "NO", the following Medicare IM given date fields will be blank) Date Medicare IM given:   Date Additional Medicare IM given:    Discharge Disposition:    Per UR Regulation:  Reviewed for med. necessity/level of care/duration of stay  If discussed at [place] of Stay Meetings, dates discussed:    Comments:  08/20/13 [facility] RN,BSN [phone]

## 2013-08-20 NOTE — Progress Notes (Signed)
Patient discharged home, discharge instructions given and explained to patient and she verbalized understanding, denies any pain/distress. No wound noted, skin intact. Transported to the car by staff via wheelchair.

## 2013-08-20 NOTE — Progress Notes (Signed)
Echocardiogram 2D Echocardiogram has been performed.  Kim Blankenship Kim Blankenship 08/20/2013, 3:11 PM

## 2014-01-19 ENCOUNTER — Emergency Department (HOSPITAL_COMMUNITY)
Admission: EM | Admit: 2014-01-19 | Discharge: 2014-01-19 | Disposition: A | Discharge status: Home / Self Care | Payer: BC Managed Care – PPO | Attending: Emergency Medicine | Admitting: Emergency Medicine

## 2014-01-19 ENCOUNTER — Encounter (HOSPITAL_COMMUNITY): Payer: Self-pay | Admitting: Emergency Medicine

## 2014-01-19 ENCOUNTER — Emergency Department (HOSPITAL_COMMUNITY): Payer: BC Managed Care – PPO

## 2014-01-19 DIAGNOSIS — Z79899 Other long term (current) drug therapy: Secondary | ICD-10-CM | POA: Diagnosis not present

## 2014-01-19 DIAGNOSIS — Y9289 Other specified places as the place of occurrence of the external cause: Secondary | ICD-10-CM | POA: Diagnosis not present

## 2014-01-19 DIAGNOSIS — I4891 Unspecified atrial fibrillation: Secondary | ICD-10-CM | POA: Insufficient documentation

## 2014-01-19 DIAGNOSIS — J449 Chronic obstructive pulmonary disease, unspecified: Secondary | ICD-10-CM | POA: Insufficient documentation

## 2014-01-19 DIAGNOSIS — S8992XA Unspecified injury of left lower leg, initial encounter: Secondary | ICD-10-CM | POA: Diagnosis present

## 2014-01-19 DIAGNOSIS — Z87891 Personal history of nicotine dependence: Secondary | ICD-10-CM | POA: Diagnosis not present

## 2014-01-19 DIAGNOSIS — Y9389 Activity, other specified: Secondary | ICD-10-CM | POA: Diagnosis not present

## 2014-01-19 DIAGNOSIS — Z7951 Long term (current) use of inhaled steroids: Secondary | ICD-10-CM | POA: Diagnosis not present

## 2014-01-19 DIAGNOSIS — I10 Essential (primary) hypertension: Secondary | ICD-10-CM | POA: Insufficient documentation

## 2014-01-19 DIAGNOSIS — S60211A Contusion of right wrist, initial encounter: Secondary | ICD-10-CM | POA: Diagnosis not present

## 2014-01-19 DIAGNOSIS — S81812A Laceration without foreign body, left lower leg, initial encounter: Secondary | ICD-10-CM | POA: Diagnosis not present

## 2014-01-19 DIAGNOSIS — W19XXXA Unspecified fall, initial encounter: Secondary | ICD-10-CM

## 2014-01-19 DIAGNOSIS — W06XXXA Fall from bed, initial encounter: Secondary | ICD-10-CM | POA: Diagnosis not present

## 2014-01-19 DIAGNOSIS — S7011XA Contusion of right thigh, initial encounter: Secondary | ICD-10-CM | POA: Insufficient documentation

## 2014-01-19 HISTORY — DX: Essential (primary) hypertension: I10

## 2014-01-19 HISTORY — DX: Unspecified atrial fibrillation: I48.91

## 2014-01-19 MED ORDER — OXYCODONE-ACETAMINOPHEN 5-325 MG PO TABS: 1.0000 | ORAL_TABLET | Freq: Four times a day (QID) | ORAL | Status: DC | PRN

## 2014-01-19 NOTE — Discharge Instructions (Signed)
Contusion A contusion is a deep bruise. Contusions are the result of an injury that caused bleeding under the skin. The contusion may turn blue, purple, or yellow. Minor injuries will give you a painless contusion, but more severe contusions may stay painful and swollen for a few weeks.  CAUSES  A contusion is usually caused by a blow, trauma, or direct force to an area of the body. SYMPTOMS   Swelling and redness of the injured area.  Bruising of the injured area.  Tenderness and soreness of the injured area.  Pain. DIAGNOSIS  The diagnosis can be made by taking a history and physical exam. An X-ray, CT scan, or MRI may be needed to determine if there were any associated injuries, such as fractures. TREATMENT  Specific treatment will depend on what area of the body was injured. In general, the best treatment for a contusion is resting, icing, elevating, and applying cold compresses to the injured area. Over-the-counter medicines may also be recommended for pain control. Ask your caregiver what the best treatment is for your contusion. HOME CARE INSTRUCTIONS   Put ice on the injured area.  Put ice in a plastic bag.  Place a towel between your skin and the bag.  Leave the ice on for 15-20 minutes, 3-4 times a day, or as directed by your health care provider.  Only take over-the-counter or prescription medicines for pain, discomfort, or fever as directed by your caregiver. Your caregiver may recommend avoiding anti-inflammatory medicines (aspirin, ibuprofen, and naproxen) for 48 hours because these medicines may increase bruising.  Rest the injured area.  If possible, elevate the injured area to reduce swelling. SEEK IMMEDIATE MEDICAL CARE IF:   You have increased bruising or swelling.  You have pain that is getting worse.  Your swelling or pain is not relieved with medicines. MAKE SURE YOU:   Understand these instructions.  Will watch your condition.  Will get help right  away if you are not doing well or get worse. Document Released: 12/26/2004 Document Revised: 03/23/2013 Document Reviewed: 01/21/2011 [facility] Patient Information 2015 [place], [place]. This information is not intended to replace advice given to you by your health care provider. Make sure you discuss any questions you have with your health care provider.  Stitches, Staples, or Skin Adhesive Strips  Stitches (sutures), staples, and skin adhesive strips hold the skin together as it heals. They will usually be in place for 7 days or less. HOME CARE  Wash your hands with soap and water before and after you touch your wound.  Only take medicine as told by your doctor.  Cover your wound only if your doctor told you to. Otherwise, leave it open to air.  Do not get your stitches wet or dirty. If they get dirty, dab them gently with a clean washcloth. Wet the washcloth with soapy water. Do not rub. Pat them dry gently.  Do not put medicine or medicated cream on your stitches unless your doctor told you to.  Do not take out your own stitches or staples. Skin adhesive strips will fall off by themselves.  Do not pick at the wound. Picking can cause an infection.  Do not miss your follow-up appointment.  If you have problems or questions, call your doctor. GET HELP RIGHT AWAY IF:   You have a temperature by mouth above 102 F (38.9 C), not controlled by medicine.  You have chills.  You have redness or pain around your stitches.  There is puffiness (  swelling) around your stitches.  You notice fluid (drainage) from your stitches.  There is a bad smell coming from your wound. MAKE SURE YOU:  Understand these instructions.  Will watch your condition.  Will get help if you are not doing well or get worse. Document Released: 01/13/2009 Document Revised: 06/10/2011 Document Reviewed: 01/13/2009 [facility] Patient Information 2015 [place], [place]. This information is not intended to replace  advice given to you by your health care provider. Make sure you discuss any questions you have with your health care provider.

## 2014-01-19 NOTE — ED Notes (Signed)
Bed: WA08 Expected date:  Expected time:  Means of arrival:  Comments: Fall, 4 inch lac to leg

## 2014-01-19 NOTE — ED Notes (Signed)
MD at bedside. EDP PICKERING AT BS 

## 2014-01-19 NOTE — ED Notes (Signed)
Pt to xray

## 2014-01-19 NOTE — ED Notes (Signed)
Per [facility]- Pt reports tripped and fell forward landing on both wrist only c/o of rt wrist. No deformity present. No LOC- Denies neck or back pain [facility] cleared. Presents with 4 inch laceration to left shin/ bleeding controlled and dressing applied by EMS. Good CMS. Contusion to anterior rt thigh. GCS 15. Denies N/V

## 2014-01-19 NOTE — ED Provider Notes (Signed)
CSN: [id number]     Arrival date & time 01/19/14  1337 History   First MD Initiated Contact with Patient 01/19/14 1500     Chief Complaint  Patient presents with  . Fall  . Extremity Laceration  . Knee Pain  . Knee Injury  . Wrist Pain     (Consider location/radiation/quality/duration/timing/severity/associated sxs/prior Treatment) Patient is a 68 y.o. female presenting with fall, knee pain, and wrist pain. The history is provided by the patient.  Fall This is a new problem. Pertinent negatives include no chest pain, no abdominal pain, no headaches and no shortness of breath.  Knee Pain Associated symptoms: no back pain   Wrist Pain Pertinent negatives include no chest pain, no abdominal pain, no headaches and no shortness of breath.   patient states that she slipped while getting out of bed to and has pain in her right wrist and cut on her left leg. She has some pain in her right thigh to the it is not broken. She denies anticoagulation. She she did not hit her head or pass out. No head or neck pain. No chest pain. Abdominal pain. No numbness or weakness. She denies pain in her knees elbows or shoulders.  Past Medical History  Diagnosis Date  . Asthma   . COPD (chronic obstructive pulmonary disease)   . Irregular heart rate   . Atrial fibrillation   . Hypertension    Past Surgical History  Procedure Laterality Date  . Left foot reconstruction    . Tonsillectomy    . Cesarean section     No family history on file. History  Substance Use Topics  . Smoking status: Former Smoker    Types: Cigarettes  . Smokeless tobacco: Never Used  . Alcohol Use: No   OB History   Grav Para Term Preterm Abortions TAB SAB Ect Mult Living                 Review of Systems  Constitutional: Negative for activity change and appetite change.  Eyes: Negative for pain.  Respiratory: Negative for chest tightness and shortness of breath.   Cardiovascular: Negative for chest pain and leg  swelling.  Gastrointestinal: Negative for nausea, vomiting, abdominal pain and diarrhea.  Genitourinary: Negative for flank pain.  Musculoskeletal: Negative for back pain and neck stiffness.       Right wrist pain  Skin: Positive for wound. Negative for rash.  Neurological: Negative for weakness, numbness and headaches.  Psychiatric/Behavioral: Negative for behavioral problems.      Allergies  Review of patient's allergies indicates no known allergies.  Home Medications   Prior to Admission medications   Medication Sig Start Date End Date Taking? Authorizing Provider  albuterol (PROVENTIL HFA;VENTOLIN HFA) 108 (90 BASE) MCG/ACT inhaler Inhale 2 puffs into the lungs every 4 (four) hours as needed for wheezing or shortness of breath.   Yes Historical Provider, MD  budesonide-formoterol (SYMBICORT) 160-4.5 MCG/ACT inhaler Inhale 2 puffs into the lungs 2 (two) times daily.   Yes Historical Provider, MD  diltiazem (CARDIZEM CD) 300 MG 24 hr capsule Take 300 mg by mouth daily.   Yes Historical Provider, MD  DM-Doxylamine-Acetaminophen (VICKS NYQUIL COLD & FLU) 15-6.25-325 MG/15ML LIQD Take 30 mLs by mouth at bedtime as needed (for cough).   Yes Historical Provider, MD  losartan-hydrochlorothiazide (HYZAAR) 50-12.5 MG per tablet Take 1 tablet by mouth daily.   Yes Historical Provider, MD  Multiple Vitamin (MULTIVITAMIN) tablet Take 1 tablet by mouth daily.  Yes Historical Provider, MD  pramipexole (MIRAPEX) 0.25 MG tablet Take 0.25 mg by mouth daily with supper.   Yes Historical Provider, MD   BP 120/58  Pulse 71  Temp(Src) 98 F (36.7 C) (Oral)  Resp 21  SpO2 94% Physical Exam  Constitutional: She appears well-developed and well-nourished.  HENT:  Head: Normocephalic and atraumatic.  Eyes: Pupils are equal, round, and reactive to light.  Neck: Neck supple.  Cardiovascular: Normal rate.   Pulmonary/Chest: Effort normal.  Abdominal: Soft.  Musculoskeletal: She exhibits tenderness.   Tenderness to dorsum of right wrist laterally. Sensation grossly intact over hand. was intact. A. mild tenderness over right thigh with mild ecchymosis. bone appears intact. There is approximately 36-year-old laceration was rather superficial over left anterior tibia.   Neurological: She is alert.  Skin: Skin is warm.    ED Course  Procedures (including critical care time) Labs Review Labs Reviewed - No data to display  Imaging Review No results found.   EKG Interpretation None      MDM   Final diagnoses:  None    Patient with fall. Laceration repaired with Dermabond. Right wrist tenderness but negative x-ray. Will followup with PCP as needed. No other apparent injury, but does have some pain over the thigh. Could be small hematoma in it. She is not on anticoagulation, but she was informed to watch out for complications.  LACERATION REPAIR Performed by: Mackie Pai Authorized by: Mackie Pai Consent: Verbal consent obtained. Risks and benefits: risks, benefits and alternatives were discussed Consent given by: patient Patient identity confirmed: provided demographic data Prepped and Draped in normal sterile fashion Wound explored  Laceration Location: Left lower leg  Laceration Length: 8 cm  No Foreign Bodies seen or palpated  Irrigation method: Scrub  Amount of cleaning: standard  Skin closure: Dermabond   Patient tolerance: Patient tolerated the procedure well with no immediate complications.   Jasper Riling. Alvino Chapel, MD 01/19/14 [phone]

## 2015-03-08 ENCOUNTER — Other Ambulatory Visit: Payer: Self-pay | Admitting: Orthopedic Surgery

## 2015-03-09 ENCOUNTER — Encounter (HOSPITAL_BASED_OUTPATIENT_CLINIC_OR_DEPARTMENT_OTHER): Payer: Self-pay | Admitting: *Deleted

## 2015-03-09 NOTE — Progress Notes (Signed)
Will repeat EKG tomorrow if needed.  Labs and EKG 11/15 received from Dr. Maceo Pro at [facility].  Reviewed with Dr. Imogene Burn, ok for surgery tomorrow at [facility].

## 2015-03-10 ENCOUNTER — Ambulatory Visit (HOSPITAL_BASED_OUTPATIENT_CLINIC_OR_DEPARTMENT_OTHER): Payer: BLUE CROSS/BLUE SHIELD | Admitting: Anesthesiology

## 2015-03-10 ENCOUNTER — Ambulatory Visit (HOSPITAL_BASED_OUTPATIENT_CLINIC_OR_DEPARTMENT_OTHER)
Admission: RE | Admit: 2015-03-10 | Discharge: 2015-03-10 | Disposition: A | Discharge status: Home / Self Care | Payer: BLUE CROSS/BLUE SHIELD | Source: Ambulatory Visit | Attending: Orthopedic Surgery | Admitting: Orthopedic Surgery

## 2015-03-10 ENCOUNTER — Encounter (HOSPITAL_BASED_OUTPATIENT_CLINIC_OR_DEPARTMENT_OTHER): Payer: Self-pay | Admitting: Anesthesiology

## 2015-03-10 ENCOUNTER — Encounter (HOSPITAL_BASED_OUTPATIENT_CLINIC_OR_DEPARTMENT_OTHER)
Admission: RE | Disposition: A | Discharge status: Home / Self Care | Payer: Self-pay | Source: Ambulatory Visit | Attending: Orthopedic Surgery

## 2015-03-10 DIAGNOSIS — J45909 Unspecified asthma, uncomplicated: Secondary | ICD-10-CM | POA: Insufficient documentation

## 2015-03-10 DIAGNOSIS — S62302A Unspecified fracture of third metacarpal bone, right hand, initial encounter for closed fracture: Secondary | ICD-10-CM | POA: Insufficient documentation

## 2015-03-10 DIAGNOSIS — I10 Essential (primary) hypertension: Secondary | ICD-10-CM | POA: Diagnosis not present

## 2015-03-10 DIAGNOSIS — Z87891 Personal history of nicotine dependence: Secondary | ICD-10-CM | POA: Diagnosis not present

## 2015-03-10 DIAGNOSIS — W19XXXA Unspecified fall, initial encounter: Secondary | ICD-10-CM | POA: Diagnosis not present

## 2015-03-10 DIAGNOSIS — J449 Chronic obstructive pulmonary disease, unspecified: Secondary | ICD-10-CM | POA: Diagnosis not present

## 2015-03-10 HISTORY — PX: OPEN REDUCTION INTERNAL FIXATION (ORIF) METACARPAL: SHX6234

## 2015-03-10 SURGERY — OPEN REDUCTION INTERNAL FIXATION (ORIF) METACARPAL
Anesthesia: General | Site: Hand | Laterality: Right

## 2015-03-10 MED ORDER — FENTANYL CITRATE (PF) 100 MCG/2ML IJ SOLN
INTRAMUSCULAR | Status: AC
  Filled 2015-03-10: qty 2

## 2015-03-10 MED ORDER — LACTATED RINGERS IV SOLN
INTRAVENOUS | Status: DC
  Administered 2015-03-10 (×3): via INTRAVENOUS

## 2015-03-10 MED ORDER — SCOPOLAMINE 1 MG/3DAYS TD PT72: 1.0000 | MEDICATED_PATCH | Freq: Once | TRANSDERMAL | Status: DC | PRN

## 2015-03-10 MED ORDER — ONDANSETRON HCL 4 MG/2ML IJ SOLN
INTRAMUSCULAR | Status: DC | PRN
  Administered 2015-03-10: 4 mg via INTRAVENOUS

## 2015-03-10 MED ORDER — MIDAZOLAM HCL 2 MG/2ML IJ SOLN
INTRAMUSCULAR | Status: AC
  Filled 2015-03-10: qty 2

## 2015-03-10 MED ORDER — LIDOCAINE HCL (CARDIAC) 20 MG/ML IV SOLN
INTRAVENOUS | Status: DC | PRN
  Administered 2015-03-10: 50 mg via INTRAVENOUS

## 2015-03-10 MED ORDER — ONDANSETRON HCL 4 MG/2ML IJ SOLN: 4.0000 mg | Freq: Once | INTRAMUSCULAR | Status: DC | PRN

## 2015-03-10 MED ORDER — CEFAZOLIN SODIUM-DEXTROSE 2-3 GM-% IV SOLR
INTRAVENOUS | Status: AC
  Filled 2015-03-10: qty 50

## 2015-03-10 MED ORDER — OXYCODONE HCL 5 MG/5ML PO SOLN: 5.0000 mg | Freq: Once | ORAL | Status: AC | PRN

## 2015-03-10 MED ORDER — PROPOFOL 10 MG/ML IV BOLUS
INTRAVENOUS | Status: DC | PRN
  Administered 2015-03-10: 200 mg via INTRAVENOUS

## 2015-03-10 MED ORDER — FENTANYL CITRATE (PF) 100 MCG/2ML IJ SOLN
50.0000 ug | INTRAMUSCULAR | Status: DC | PRN
  Administered 2015-03-10: 50 ug via INTRAVENOUS

## 2015-03-10 MED ORDER — FENTANYL CITRATE (PF) 100 MCG/2ML IJ SOLN
25.0000 ug | INTRAMUSCULAR | Status: DC | PRN
  Administered 2015-03-10: 50 ug via INTRAVENOUS
  Administered 2015-03-10 (×2): 25 ug via INTRAVENOUS

## 2015-03-10 MED ORDER — 0.9 % SODIUM CHLORIDE (POUR BTL) OPTIME
TOPICAL | Status: DC | PRN
  Administered 2015-03-10: 400 mL

## 2015-03-10 MED ORDER — OXYCODONE-ACETAMINOPHEN 5-325 MG PO TABS: ORAL_TABLET | ORAL | Status: DC

## 2015-03-10 MED ORDER — OXYCODONE HCL 5 MG PO TABS
ORAL_TABLET | ORAL | Status: AC
  Filled 2015-03-10: qty 1

## 2015-03-10 MED ORDER — CHLORHEXIDINE GLUCONATE 4 % EX LIQD: 60.0000 mL | Freq: Once | CUTANEOUS | Status: DC

## 2015-03-10 MED ORDER — MIDAZOLAM HCL 2 MG/2ML IJ SOLN
1.0000 mg | INTRAMUSCULAR | Status: DC | PRN
  Administered 2015-03-10: 1 mg via INTRAVENOUS

## 2015-03-10 MED ORDER — DEXAMETHASONE SODIUM PHOSPHATE 4 MG/ML IJ SOLN
INTRAMUSCULAR | Status: DC | PRN
  Administered 2015-03-10: 10 mg via INTRAVENOUS

## 2015-03-10 MED ORDER — SUCCINYLCHOLINE CHLORIDE 20 MG/ML IJ SOLN
INTRAMUSCULAR | Status: DC | PRN
  Administered 2015-03-10: 100 mg via INTRAVENOUS

## 2015-03-10 MED ORDER — CEFAZOLIN SODIUM-DEXTROSE 2-3 GM-% IV SOLR: 2.0000 g | INTRAVENOUS | Status: DC

## 2015-03-10 MED ORDER — GLYCOPYRROLATE 0.2 MG/ML IJ SOLN: 0.2000 mg | Freq: Once | INTRAMUSCULAR | Status: DC | PRN

## 2015-03-10 MED ORDER — BUPIVACAINE HCL (PF) 0.5 % IJ SOLN
INTRAMUSCULAR | Status: DC | PRN
  Administered 2015-03-10: 20 mL via PERINEURAL

## 2015-03-10 MED ORDER — DEXAMETHASONE SODIUM PHOSPHATE 10 MG/ML IJ SOLN
INTRAMUSCULAR | Status: AC
  Filled 2015-03-10: qty 1

## 2015-03-10 MED ORDER — OXYCODONE HCL 5 MG PO TABS
5.0000 mg | ORAL_TABLET | Freq: Once | ORAL | Status: AC | PRN
  Administered 2015-03-10: 5 mg via ORAL

## 2015-03-10 MED ORDER — ONDANSETRON HCL 4 MG/2ML IJ SOLN
INTRAMUSCULAR | Status: AC
  Filled 2015-03-10: qty 2

## 2015-03-10 SURGICAL SUPPLY — 71 items
BANDAGE ELASTIC 3 VELCRO ST LF (GAUZE/BANDAGES/DRESSINGS) ×2 IMPLANT
BIT DRILL 1.1 (BIT) ×1
BIT DRILL 60X20X1.1XQC TMX (BIT) ×1 IMPLANT
BIT DRL 60X20X1.1XQC TMX (BIT) ×1
BLADE MINI RND TIP GREEN BEAV (BLADE) IMPLANT
BLADE SURG 15 STRL LF DISP TIS (BLADE) ×2 IMPLANT
BLADE SURG 15 STRL SS (BLADE) ×2
BNDG ESMARK 4X9 LF (GAUZE/BANDAGES/DRESSINGS) ×2 IMPLANT
BNDG GAUZE ELAST 4 BULKY (GAUZE/BANDAGES/DRESSINGS) ×2 IMPLANT
CHLORAPREP W/TINT 26ML (MISCELLANEOUS) ×2 IMPLANT
CORDS BIPOLAR (ELECTRODE) ×2 IMPLANT
COVER BACK TABLE 60X90IN (DRAPES) ×2 IMPLANT
COVER MAYO STAND STRL (DRAPES) ×2 IMPLANT
CUFF TOURNIQUET SINGLE 18IN (TOURNIQUET CUFF) ×2 IMPLANT
DRAPE EXTREMITY T 121X128X90 (DRAPE) ×2 IMPLANT
DRAPE OEC MINIVIEW 54X84 (DRAPES) ×2 IMPLANT
DRAPE SURG 17X23 STRL (DRAPES) ×2 IMPLANT
DRIVER BIT 1.5 (TRAUMA) ×4 IMPLANT
GAUZE SPONGE 4X4 12PLY STRL (GAUZE/BANDAGES/DRESSINGS) ×2 IMPLANT
GAUZE XEROFORM 1X8 LF (GAUZE/BANDAGES/DRESSINGS) ×2 IMPLANT
GLOVE BIO SURGEON STRL SZ7.5 (GLOVE) ×2 IMPLANT
GLOVE BIOGEL M STRL SZ7.5 (GLOVE) ×4 IMPLANT
GLOVE BIOGEL PI IND STRL 8 (GLOVE) ×2 IMPLANT
GLOVE BIOGEL PI INDICATOR 8 (GLOVE) ×2
GOWN STRL REUS W/ TWL LRG LVL3 (GOWN DISPOSABLE) ×1 IMPLANT
GOWN STRL REUS W/ TWL XL LVL3 (GOWN DISPOSABLE) ×1 IMPLANT
GOWN STRL REUS W/TWL LRG LVL3 (GOWN DISPOSABLE) ×1
GOWN STRL REUS W/TWL XL LVL3 (GOWN DISPOSABLE) ×1
K-WIRE 6 .028 (WIRE) ×4
K-WIRE DBL TRONS .035X6 (WIRE) ×2
KWIRE 6 .028 (WIRE) ×2 IMPLANT
KWIRE DBL TRONS .035X6 (WIRE) ×1 IMPLANT
NEEDLE HYPO 22GX1.5 SAFETY (NEEDLE) IMPLANT
NEEDLE HYPO 25X1 1.5 SAFETY (NEEDLE) IMPLANT
NON LOCK SCREW 1.5X20MM (Screw) ×2 IMPLANT
NS IRRIG 1000ML POUR BTL (IV SOLUTION) ×2 IMPLANT
PACK BASIN DAY SURGERY FS (CUSTOM PROCEDURE TRAY) ×2 IMPLANT
PAD CAST 3X4 CTTN HI CHSV (CAST SUPPLIES) IMPLANT
PAD CAST 4YDX4 CTTN HI CHSV (CAST SUPPLIES) ×1 IMPLANT
PADDING CAST ABS 4INX4YD NS (CAST SUPPLIES)
PADDING CAST ABS COTTON 4X4 ST (CAST SUPPLIES) IMPLANT
PADDING CAST COTTON 3X4 STRL (CAST SUPPLIES)
PADDING CAST COTTON 4X4 STRL (CAST SUPPLIES) ×1
PLATE LOCKING 1.5 Y SHAPE (Plate) ×2 IMPLANT
SCREW 1.5X15MM (Screw) ×2 IMPLANT
SCREW 1.5X18MM (Screw) ×4 IMPLANT
SCREW BN 18X1.5XST NONLOCK (Screw) ×2 IMPLANT
SCREW NL 1.5X11 WRIST (Screw) ×4 IMPLANT
SCREW NL 1.5X12 (Screw) ×4 IMPLANT
SCREW NL 1.5X13 (Screw) ×4 IMPLANT
SCREW NON LOCK 1.5X20MM (Screw) ×1 IMPLANT
SCREW NONIOC 1.5 14M (Screw) ×4 IMPLANT
SCREW NONIOC 1.5 16M (Screw) ×4 IMPLANT
SLEEVE SCD COMPRESS KNEE MED (MISCELLANEOUS) ×2 IMPLANT
SLING ARM FOAM STRAP LRG (SOFTGOODS) ×2 IMPLANT
SPLINT PLASTER CAST XFAST 3X15 (CAST SUPPLIES) IMPLANT
SPLINT PLASTER CAST XFAST 4X15 (CAST SUPPLIES) IMPLANT
SPLINT PLASTER XTRA FAST SET 4 (CAST SUPPLIES)
SPLINT PLASTER XTRA FASTSET 3X (CAST SUPPLIES)
STOCKINETTE 4X48 STRL (DRAPES) ×2 IMPLANT
SUT CHROMIC 4 0 P 3 18 (SUTURE) ×2 IMPLANT
SUT ETHILON 3 0 PS 1 (SUTURE) IMPLANT
SUT ETHILON 4 0 PS 2 18 (SUTURE) ×2 IMPLANT
SUT MERSILENE 4 0 P 3 (SUTURE) ×2 IMPLANT
SUT VIC AB 3-0 PS1 18 (SUTURE)
SUT VIC AB 3-0 PS1 18XBRD (SUTURE) IMPLANT
SUT VICRYL 4-0 PS2 18IN ABS (SUTURE) ×2 IMPLANT
SYR BULB 3OZ (MISCELLANEOUS) ×2 IMPLANT
SYR CONTROL 10ML LL (SYRINGE) IMPLANT
TOWEL OR 17X24 6PK STRL BLUE (TOWEL DISPOSABLE) ×4 IMPLANT
UNDERPAD 30X30 (UNDERPADS AND DIAPERS) ×2 IMPLANT

## 2015-03-10 NOTE — Anesthesia Procedure Notes (Addendum)
Anesthesia Regional Block:  Axillary brachial plexus block  Pre-Anesthetic Checklist: ,, timeout performed, Correct Patient, Correct Site, Correct Laterality, Correct Procedure, Correct Position, site marked, Risks and benefits discussed,  Surgical consent,  Pre-op evaluation,  At surgeon's request and post-op pain management  Laterality: Right  Prep: chloraprep       Needles:  Injection technique: Single-shot  Needle Type: Stimiplex     Needle Length: 5cm 5 cm Needle Gauge: 22 and 22 G    Additional Needles:  Procedures: nerve stimulator Axillary brachial plexus block  Nerve Stimulator or Paresthesia:  Response: 0.4 mA,   Additional Responses:   Narrative:  Injection made incrementally with aspirations every 5 mL.  Performed by: Personally   Additional Notes: Patient tolerated the procedure well without complications   Procedure Name: Intubation Date/Time: 03/10/2015 2:27 PM Performed by: Melynda Ripple D Pre-anesthesia Checklist: Patient identified, Emergency Drugs available, Suction available and Patient being monitored Patient Re-evaluated:Patient Re-evaluated prior to inductionOxygen Delivery Method: Circle System Utilized Preoxygenation: Pre-oxygenation with 100% oxygen Intubation Type: IV induction Ventilation: Mask ventilation without difficulty Laryngoscope Size: Mac and 3 Grade View: Grade I Tube type: Oral Number of attempts: 1 Airway Equipment and Method: Stylet and Oral airway Placement Confirmation: ETT inserted through vocal cords under direct vision,  positive ETCO2 and breath sounds checked- equal and bilateral Secured at: 22 cm Tube secured with: Tape Dental Injury: Teeth and Oropharynx as per pre-operative assessment

## 2015-03-10 NOTE — Op Note (Signed)
[id number] 

## 2015-03-10 NOTE — H&P (Signed)
  Kim Blankenship is an 69 y.o. female.   Chief Complaint: right hand metacarpal fracture HPI: [name] is a 69 year old right hand dominant female states she fell from a standing height, injuring her right hand. She was seen at the [facility] where radiographs were taken revealing a metacarpal fracture. She is not splinted and referred here for further followup. She reports no previous injuries to the hand and no other injuries at this time with the exception of some bruising or face and scrapes. She has noticed some pain when trying to sleep.  She wishes to have surgical fixation of her fracture.   Past Medical History  Diagnosis Date  . Asthma   . COPD (chronic obstructive pulmonary disease) ([place])   . Irregular heart rate   . Atrial fibrillation ([place])   . Hypertension     Past Surgical History  Procedure Laterality Date  . Left foot reconstruction    . Tonsillectomy    . Cesarean section      History reviewed. No pertinent family history. Social History:  reports that she has quit smoking. Her smoking use included Cigarettes. She has never used smokeless tobacco. She reports that she does not drink alcohol or use illicit drugs.  Allergies: No Known Allergies  Medications Prior to Admission  Medication Sig Dispense Refill  . albuterol (PROVENTIL HFA;VENTOLIN HFA) 108 (90 BASE) MCG/ACT inhaler Inhale 2 puffs into the lungs every 4 (four) hours as needed for wheezing or shortness of breath.    . budesonide-formoterol (SYMBICORT) 160-4.5 MCG/ACT inhaler Inhale 2 puffs into the lungs 2 (two) times daily.    Marland Kitchen diltiazem (CARDIZEM CD) 300 MG 24 hr capsule Take 300 mg by mouth daily.    Marland Kitchen HYDROcodone-acetaminophen (NORCO/VICODIN) 5-325 MG tablet Take 1 tablet by mouth every 6 (six) hours as needed for moderate pain.    Marland Kitchen losartan-hydrochlorothiazide (HYZAAR) 50-12.5 MG per tablet Take 1 tablet by mouth daily.    . Multiple Vitamin (MULTIVITAMIN) tablet Take 1 tablet by mouth daily.    .  pramipexole (MIRAPEX) 0.25 MG tablet Take 0.25 mg by mouth 3 (three) times daily.    Marland Kitchen DM-Doxylamine-Acetaminophen (VICKS NYQUIL COLD & FLU) 15-6.25-325 MG/15ML LIQD Take 30 mLs by mouth at bedtime as needed (for cough).      No results found for this or any previous visit (from the past 48 hour(s)).  No results found.   A comprehensive review of systems was negative.  Blood pressure 114/57, pulse 71, temperature 98.2 F (36.8 C), temperature source Oral, resp. rate 14, height 5\' 5"  (1.651 m), weight 72.576 kg (160 lb), SpO2 98 %.  General appearance: alert, cooperative and appears stated age Head: Normocephalic, without obvious abnormality, atraumatic Neck: supple, symmetrical, trachea midline Resp: clear to auscultation bilaterally Cardio: regular rate and rhythm GI: non tender Extremities: intact sensation and capillary refill all digits.  +epl/fpl/io.  no wounds. Pulses: 2+ and symmetric Skin: Skin color, texture, turgor normal. No rashes or lesions Neurologic: Grossly normal Incision/Wound: none  Assessment/Plan Right long finger metacarpal fracture.  Non operative and operative treatment options were discussed with the patient and patient wishes to proceed with operative treatment. Risks, benefits, and alternatives of surgery were discussed and the patient agrees with the plan of care.   Salley Boxley R 03/10/2015, 2:12 PM

## 2015-03-10 NOTE — Anesthesia Preprocedure Evaluation (Signed)
Anesthesia Evaluation    Airway Mallampati: II  TM Distance: >3 FB Neck ROM: Full    Dental no notable dental hx.    Pulmonary asthma , COPD,  COPD inhaler, former smoker,    Pulmonary exam normal breath sounds clear to auscultation       Cardiovascular hypertension, Normal cardiovascular exam Rhythm:Regular Rate:Normal     Neuro/Psych    GI/Hepatic   Endo/Other    Renal/GU      Musculoskeletal   Abdominal   Peds  Hematology   Anesthesia Other Findings   Reproductive/Obstetrics                             Anesthesia Physical Anesthesia Plan  ASA: III  Anesthesia Plan: General   Post-op Pain Management: GA combined w/ Regional for post-op pain   Induction: Intravenous  Airway Management Planned: LMA and Oral ETT  Additional Equipment:   Intra-op Plan:   Post-operative Plan: Extubation in OR  Informed Consent: I have reviewed the patients History and Physical, chart, labs and discussed the procedure including the risks, benefits and alternatives for the proposed anesthesia with the patient or authorized representative who has indicated his/her understanding and acceptance.   Dental advisory given  Plan Discussed with: CRNA and Surgeon  Anesthesia Plan Comments:         Anesthesia Quick Evaluation

## 2015-03-10 NOTE — Discharge Instructions (Addendum)

## 2015-03-10 NOTE — Brief Op Note (Signed)
03/10/2015  4:42 PM  PATIENT:  Kim Blankenship  69 y.o. female  PRE-OPERATIVE DIAGNOSIS:  RIGHT LONG METACARPAL FRACTURE  POST-OPERATIVE DIAGNOSIS:  RIGHT LONG METACARPAL FRACTURE  PROCEDURE:  Procedure(s): OPEN REDUCTION INTERNAL FIXATION (ORIF) RIGHT LONG  METACARPAL FRACTURE (Right)  SURGEON:  Surgeon(s) and Role:    * Leanora Cover, MD - Primary  PHYSICIAN ASSISTANT:   ASSISTANTS: none   ANESTHESIA:   regional and general  EBL:  Total I/O In: 1750 [I.V.:1750] Out: -   BLOOD ADMINISTERED:none  DRAINS: none   LOCAL MEDICATIONS USED:  NONE  SPECIMEN:  No Specimen  DISPOSITION OF SPECIMEN:  N/A  COUNTS:  YES  TOURNIQUET:   Total Tourniquet Time Documented: Upper Arm (Right) - 113 minutes Total: Upper Arm (Right) - 113 minutes   DICTATION: .Other Dictation: Dictation Number [id number]  PLAN OF CARE: Discharge to home after PACU  PATIENT DISPOSITION:  PACU - hemodynamically stable.

## 2015-03-10 NOTE — Op Note (Signed)
Intra-operative fluoroscopic images in the AP, lateral, and oblique views were taken and evaluated by myself.  Reduction and hardware placement were confirmed.  There was no intraarticular penetration of permanent hardware.  

## 2015-03-10 NOTE — Transfer of Care (Signed)
Immediate Anesthesia Transfer of Care Note  Patient: Kim Blankenship  Procedure(s) Performed: Procedure(s): OPEN REDUCTION INTERNAL FIXATION (ORIF) RIGHT LONG  METACARPAL FRACTURE (Right)  Patient Location: PACU  Anesthesia Type:GA combined with regional for post-op pain  Level of Consciousness: awake, alert  and oriented  Airway & Oxygen Therapy: Patient Spontanous Breathing and Patient connected to face mask oxygen  Post-op Assessment: Report given to RN and Post -op Vital signs reviewed and stable  Post vital signs: Reviewed and stable  Last Vitals:  Filed Vitals:   03/10/15 1305 03/10/15 1310  BP:  114/57  Pulse: 67 71  Temp:    Resp: 12 14    Complications: No apparent anesthesia complications

## 2015-03-10 NOTE — Progress Notes (Signed)
Assisted Dr. Rose with right, axillary block. Side rails up, monitors on throughout procedure. See vital signs in flow sheet. Tolerated Procedure well. 

## 2015-03-11 NOTE — Anesthesia Postprocedure Evaluation (Signed)
Anesthesia Post Note  Patient: Maianh Malonson  Procedure(s) Performed: Procedure(s) (LRB): OPEN REDUCTION INTERNAL FIXATION (ORIF) RIGHT LONG  METACARPAL FRACTURE (Right)  Patient location during evaluation: PACU Anesthesia Type: General Level of consciousness: awake and alert Pain management: pain level controlled Vital Signs Assessment: post-procedure vital signs reviewed and stable Respiratory status: spontaneous breathing and respiratory function stable Cardiovascular status: blood pressure returned to baseline and stable Postop Assessment: no signs of nausea or vomiting Anesthetic complications: no    Last Vitals:  Filed Vitals:   03/10/15 1745 03/10/15 1753  BP: 148/72   Pulse: 80 79  Temp:    Resp: 18 19    Last Pain:  Filed Vitals:   03/10/15 1755  PainSc: 10-Worst pain ever                 Stiles Maxcy S

## 2015-03-13 ENCOUNTER — Encounter (HOSPITAL_BASED_OUTPATIENT_CLINIC_OR_DEPARTMENT_OTHER): Payer: Self-pay | Admitting: Orthopedic Surgery

## 2015-03-13 NOTE — Op Note (Addendum)
Kim Blankenship, AGA NO.:  [id number]  MEDICAL RECORD NO.:  [id number]  LOCATION:                               FACILITY:  [place]  PHYSICIAN:  Leanora Cover, MD        DATE OF BIRTH:  06-03-1945  DATE OF PROCEDURE:  03/10/2015 DATE OF DISCHARGE:  03/10/2015                              OPERATIVE REPORT   PREOPERATIVE DIAGNOSIS:  Right long finger metacarpal shaft fracture.  POSTOPERATIVE DIAGNOSIS:  Right long finger comminuted metacarpal shaft fracture.  PROCEDURE:  Open reduction and internal fixation of right long finger metacarpal fracture.  SURGEON:  Leanora Cover, MD.  ASSISTANT:  None.  ANESTHESIA:  General with regional.  IV FLUIDS:  Per anesthesia flow sheet.  ESTIMATED BLOOD LOSS:  Minimal.  COMPLICATIONS:  None.  SPECIMENS:  None.  TOURNIQUET TIME:  113 minutes.  DISPOSITION:  Stable to PACU.  INDICATIONS:  [name] is a 69 year old female who approximately a week ago fell injuring her right hand.  She was seen in an Urgent Care, where radiographs were taken revealing a metacarpal fracture.  She was splinted and follow up in the office.  We discussed the nonoperative and operative treatment options.  She wished to proceed with operative fixation.  Risks, benefits, and alternatives of surgery were discussed including the risk of blood loss; infection; damage to nerves, vessels, tendons, ligaments, bone; failure of surgery; need for additional surgery; complications with wound healing; continued pain; nonunion; malunion; stiffness.  She voiced understanding of these risks and elected to proceed.  OPERATIVE COURSE:  After being identified preoperatively by myself, the patient and I agreed upon the procedure and site of procedure.  Surgical site was marked.  The risks, benefits, and alternatives of surgery were reviewed and she wished to proceed.  Surgical consent had been signed. She was given IV Ancef as preoperative antibiotic  prophylaxis.  Regional block was performed by Anesthesia in preoperative holding.  She was transferred to the operating room and placed on the operating room table in supine position with the right upper extremity on an armboard. General anesthesia was induced by the anesthesiologist.  The right upper extremity was prepped and draped in normal sterile orthopedic fashion. Surgical pause was performed between surgeons, anesthesia, and operating room staff, and all were in agreement as to the patient, procedure, and site of procedure.  Tourniquet at the proximal aspect of the extremity was inflated to 250 mmHg after exsanguination of the limb with an Esmarch bandage.  Incision was made in dorsum of the hand and carried into the subcutaneous tissues by spreading technique.  Bipolar electrocautery was used to obtain hemostasis.  The interval between the long and ring finger extensor tendons was performed.  A juncture was released which was later repaired.  The periosteum was sharply incised and elevated with a Soil scientist.  The fracture site was easily identified.  There was significant comminution.  There were 5 fragments. Clot was cleared from the fracture site as well as soft tissue interposition using the curette.  The fracture was reduced under direct visualization.  It was secured with bone clamps.  A Y-shaped plate from the ALPS set was  selected.  It was secured to the bone with the guide pins.  Standard AO drilling and measuring technique was used throughout the case.  All screw holes were filled.  The plate had to be adjusted on one occasion for appropriate fit.  It was bent to fit as appropriate. The C-arm was used in AP, lateral, and oblique projections throughout the case to aid in reduction and positioning of hardware.  Good reduction was obtained.  The wrist was placed through tenodesis and there was no scissoring of the long finger.  The ring and small fingers turned in more  than anticipated, but no fractures are noted of the ring or small finger metacarpals or phalanges on radiographs.  The wound was copiously irrigated with sterile saline.  The periosteum was repaired back over top of the plate using 4-0 chromic suture.  The juncture was repaired using a 4-0 Mersilene suture and the skin was closed with 4-0 nylon in a horizontal mattress fashion.  The wound was dressed with sterile 4x4s and wrapped with Kerlix bandage.  Volar and dorsal slab splints were placed with the MPs flexed and the IPs extended.  This was wrapped with Kerlix and Ace bandage.  Tourniquet was deflated at 113 minutes.  Fingertips were pink with brisk capillary refill after deflation of tourniquet.  The operative drapes were broken down.  The patient was awakened from anesthesia safely.  She was transferred back to stretcher and taken to PACU in stable condition.  I will see her back in the office in 1 week for postoperative followup.  I will give her Percocet 5/325, 1-2 p.o. q.6 hours p.r.n. pain, dispensed #40.     Leanora Cover, MD     KK/MEDQ  D:  03/10/2015  T:  03/11/2015  Job:  [id number]  Addendum (03/17/15): Diagnosis edited for clarity of fracture type.

## 2015-08-17 ENCOUNTER — Emergency Department (HOSPITAL_BASED_OUTPATIENT_CLINIC_OR_DEPARTMENT_OTHER): Payer: BLUE CROSS/BLUE SHIELD

## 2015-08-17 ENCOUNTER — Encounter (HOSPITAL_BASED_OUTPATIENT_CLINIC_OR_DEPARTMENT_OTHER): Payer: Self-pay | Admitting: *Deleted

## 2015-08-17 ENCOUNTER — Emergency Department (HOSPITAL_BASED_OUTPATIENT_CLINIC_OR_DEPARTMENT_OTHER)
Admission: EM | Admit: 2015-08-17 | Discharge: 2015-08-17 | Disposition: A | Discharge status: Home / Self Care | Payer: BLUE CROSS/BLUE SHIELD | Attending: Emergency Medicine | Admitting: Emergency Medicine

## 2015-08-17 DIAGNOSIS — J45909 Unspecified asthma, uncomplicated: Secondary | ICD-10-CM | POA: Diagnosis not present

## 2015-08-17 DIAGNOSIS — S59912A Unspecified injury of left forearm, initial encounter: Secondary | ICD-10-CM | POA: Diagnosis present

## 2015-08-17 DIAGNOSIS — Y939 Activity, unspecified: Secondary | ICD-10-CM | POA: Diagnosis not present

## 2015-08-17 DIAGNOSIS — Z87891 Personal history of nicotine dependence: Secondary | ICD-10-CM | POA: Diagnosis not present

## 2015-08-17 DIAGNOSIS — Y92003 Bedroom of unspecified non-institutional (private) residence as the place of occurrence of the external cause: Secondary | ICD-10-CM | POA: Diagnosis not present

## 2015-08-17 DIAGNOSIS — W2209XA Striking against other stationary object, initial encounter: Secondary | ICD-10-CM | POA: Insufficient documentation

## 2015-08-17 DIAGNOSIS — Z79899 Other long term (current) drug therapy: Secondary | ICD-10-CM | POA: Diagnosis not present

## 2015-08-17 DIAGNOSIS — Y999 Unspecified external cause status: Secondary | ICD-10-CM | POA: Diagnosis not present

## 2015-08-17 DIAGNOSIS — J449 Chronic obstructive pulmonary disease, unspecified: Secondary | ICD-10-CM | POA: Insufficient documentation

## 2015-08-17 DIAGNOSIS — S5012XA Contusion of left forearm, initial encounter: Secondary | ICD-10-CM | POA: Insufficient documentation

## 2015-08-17 DIAGNOSIS — T148XXA Other injury of unspecified body region, initial encounter: Secondary | ICD-10-CM

## 2015-08-17 DIAGNOSIS — I10 Essential (primary) hypertension: Secondary | ICD-10-CM | POA: Insufficient documentation

## 2015-08-17 MED ORDER — OXYCODONE-ACETAMINOPHEN 5-325 MG PO TABS: ORAL_TABLET | ORAL | Status: DC

## 2015-08-17 MED FILL — OXYCODONE/APAP 5-325: 5-325 | 3 days supply | Qty: 20 | Fill #0

## 2015-08-17 NOTE — ED Notes (Signed)
She hit her arm on the door frame this am. Large hematoma to her left forearm.

## 2015-08-17 NOTE — ED Provider Notes (Signed)
CSN: [id number]     Arrival date & time 08/17/15  1618 History   First MD Initiated Contact with Patient 08/17/15 1638     Chief Complaint  Patient presents with  . Arm Injury     (Consider location/radiation/quality/duration/timing/severity/associated sxs/prior Treatment) HPI This is a 70 year old female who presents emergency Department with chief complaint of a hematoma of the left arm. Patient states she was walking out of her bedroom this morning when she hit her arm against the molding on her door. She had immediate bruising and swelling. Over the past several hours. She has had increasingly worsening swelling and bruising of the left upper forearm. She denies any numbness or tingling in her hands. She does not take any blood thinners. She has been applying heat and ice alternative alternating over 20 minutes without relief of her symptoms. She complains of 8 out of 10 pain. Past Medical History  Diagnosis Date  . Asthma   . COPD (chronic obstructive pulmonary disease) ([place])   . Irregular heart rate   . Atrial fibrillation ([place])   . Hypertension    Past Surgical History  Procedure Laterality Date  . Left foot reconstruction    . Tonsillectomy    . Cesarean section    . Open reduction internal fixation (orif) metacarpal Right 03/10/2015    Procedure: OPEN REDUCTION INTERNAL FIXATION (ORIF) RIGHT LONG  METACARPAL FRACTURE;  Surgeon: Leanora Cover, MD;  Location: [place];  Service: Orthopedics;  Laterality: Right;   No family history on file. Social History  Substance Use Topics  . Smoking status: Former Smoker    Types: Cigarettes  . Smokeless tobacco: Never Used  . Alcohol Use: No   OB History    No data available     Review of Systems  Ten systems reviewed and are negative for acute change, except as noted in the HPI.    Allergies  Review of patient's allergies indicates no known allergies.  Home Medications   Prior to Admission medications    Medication Sig Start Date End Date Taking? Authorizing Provider  albuterol (PROVENTIL HFA;VENTOLIN HFA) 108 (90 BASE) MCG/ACT inhaler Inhale 2 puffs into the lungs every 4 (four) hours as needed for wheezing or shortness of breath.    Historical Provider, MD  budesonide-formoterol (SYMBICORT) 160-4.5 MCG/ACT inhaler Inhale 2 puffs into the lungs 2 (two) times daily.    Historical Provider, MD  diltiazem (CARDIZEM CD) 300 MG 24 hr capsule Take 300 mg by mouth daily.    Historical Provider, MD  DM-Doxylamine-Acetaminophen (VICKS NYQUIL COLD & FLU) 15-6.25-325 MG/15ML LIQD Take 30 mLs by mouth at bedtime as needed (for cough).    Historical Provider, MD  losartan-hydrochlorothiazide (HYZAAR) 50-12.5 MG per tablet Take 1 tablet by mouth daily.    Historical Provider, MD  Multiple Vitamin (MULTIVITAMIN) tablet Take 1 tablet by mouth daily.    Historical Provider, MD  oxyCODONE-acetaminophen (PERCOCET) 5-325 MG tablet 1-2 tabs po q6 hours prn pain 08/17/15   Margarita Mail, PA-C  pramipexole (MIRAPEX) 0.25 MG tablet Take 0.25 mg by mouth 3 (three) times daily.    Historical Provider, MD   BP 158/94 mmHg  Pulse 64  Temp(Src) 98.6 F (37 C) (Oral)  Resp 18  Ht 5\' 5"  (1.651 m)  Wt 67.132 kg  BMI 24.63 kg/m2  SpO2 95% Physical Exam  Constitutional: She is oriented to person, place, and time. She appears well-developed and well-nourished. No distress.  HENT:  Head: Normocephalic and atraumatic.  Eyes: Conjunctivae are normal. No scleral icterus.  Neck: Normal range of motion.  Cardiovascular: Normal rate, regular rhythm and normal heart sounds.  Exam reveals no gallop and no friction rub.   No murmur heard. Pulmonary/Chest: Effort normal and breath sounds normal. No respiratory distress.  Abdominal: Soft. Bowel sounds are normal. She exhibits no distension and no mass. There is no tenderness. There is no guarding.  Musculoskeletal:  Left forearm with tense swelling, hematoma measuring about 10 cm  in length and approximately 6 cm in height. It is tender to palpation, full range of motion of the left hand. Strong grip strength. Neurovascularly intact  Neurological: She is alert and oriented to person, place, and time.  Skin: Skin is warm and dry. She is not diaphoretic.    ED Course  Procedures (including critical care time) Labs Review Labs Reviewed - No data to display  Imaging Review Dg Forearm Left  08/17/2015  CLINICAL DATA:  Left forearm pain and swelling. EXAM: LEFT FOREARM - 2 VIEW COMPARISON:  None. FINDINGS: There is no evidence of fracture or other focal bone lesions. There is moderate to severe dorsal soft tissue swelling of the proximal forearm. IMPRESSION: No evidence of fracture or subluxation. Significant dorsal soft tissue swelling of the forearm. Electronically Signed   By: Fidela Salisbury M.D.   On: 08/17/2015 16:38   I have personally reviewed and evaluated these images and lab results as part of my medical decision-making.   EKG Interpretation None      MDM   Final diagnoses:  Hematoma  Essential hypertension    Patient seen in shared visit with attending physician, Dr. Laneta Simmers with shared plan of care. Patient given ice pack and pressure dressing. She is also given a sling. Patient appears safe for discharge. Discussed reasons to seek immediate medical care, follow-up with her   primary care physician regarding hypertension and worsening symptoms. The forearm.  Safe for discharge at this time  Margarita Mail, PA-C 08/17/15 [place], MD 08/18/15 [phone]

## 2015-08-17 NOTE — Discharge Instructions (Signed)
Hematoma  A hematoma is a collection of blood under the skin, in an organ, in a body space, in a joint space, or in other tissue. The blood can clot to form a lump that you can see and feel. The lump is often firm and may sometimes become sore and tender. Most hematomas get better in a few days to weeks. However, some hematomas may be serious and require medical care. Hematomas can range in size from very small to very large.  CAUSES   A hematoma can be caused by a blunt or penetrating injury. It can also be caused by spontaneous leakage from a blood vessel under the skin. Spontaneous leakage from a blood vessel is more likely to occur in older people, especially those taking blood thinners. Sometimes, a hematoma can develop after certain medical procedures.  SIGNS AND SYMPTOMS   · A firm lump on the body.  · Possible pain and tenderness in the area.  · Bruising. Blue, dark blue, purple-red, or yellowish skin may appear at the site of the hematoma if the hematoma is close to the surface of the skin.  For hematomas in deeper tissues or body spaces, the signs and symptoms may be subtle. For example, an intra-abdominal hematoma may cause abdominal pain, weakness, fainting, and shortness of breath. An intracranial hematoma may cause a headache or symptoms such as weakness, trouble speaking, or a change in consciousness.  DIAGNOSIS   A hematoma can usually be diagnosed based on your medical history and a physical exam. Imaging tests may be needed if your health care provider suspects a hematoma in deeper tissues or body spaces, such as the abdomen, head, or chest. These tests may include ultrasonography or a CT scan.   TREATMENT   Hematomas usually go away on their own over time. Rarely does the blood need to be drained out of the body. Large hematomas or those that may affect vital organs will sometimes need surgical drainage or monitoring.  HOME CARE INSTRUCTIONS   · Apply ice to the injured area:      Put ice in a  plastic bag.      Place a towel between your skin and the bag.      Leave the ice on for 20 minutes, 2-3 times a day for the first 1 to 2 days.    · After the first 2 days, switch to using warm compresses on the hematoma.    · Elevate the injured area to help decrease pain and swelling. Wrapping the area with an elastic bandage may also be helpful. Compression helps to reduce swelling and promotes shrinking of the hematoma. Make sure the bandage is not wrapped too tight.    · If your hematoma is on a lower extremity and is painful, crutches may be helpful for a couple days.    · Only take over-the-counter or prescription medicines as directed by your health care provider.  SEEK IMMEDIATE MEDICAL CARE IF:   · You have increasing pain, or your pain is not controlled with medicine.    · You have a fever.    · You have worsening swelling or discoloration.    · Your skin over the hematoma breaks or starts bleeding.    · Your hematoma is in your chest or abdomen and you have weakness, shortness of breath, or a change in consciousness.  · Your hematoma is on your scalp (caused by a fall or injury) and you have a worsening headache or a change in alertness or consciousness.  MAKE SURE YOU:   ·   Understand these instructions.  · Will watch your condition.  · Will get help right away if you are not doing well or get worse.     This information is not intended to replace advice given to you by your health care provider. Make sure you discuss any questions you have with your health care provider.     Document Released: 10/31/2003 Document Revised: 11/18/2012 Document Reviewed: 08/26/2012  Elsevier Interactive Patient Education ©2016 Elsevier Inc[employer]

## 2015-08-17 NOTE — ED Notes (Addendum)
Pt made aware to return if symptoms worsen or if any life threatening symptoms occur.  Sling applied to pt arm, pt c/o bandage being too tight, nurse loosened wrap.  Pt cap refill intact and pt has appropriate sensation.  Pt was taught how to check cap refill, verbalizes understanding.

## 2015-08-17 NOTE — ED Notes (Signed)
Ace wrap and coban applied to pt left arm.

## 2015-08-23 ENCOUNTER — Emergency Department (HOSPITAL_BASED_OUTPATIENT_CLINIC_OR_DEPARTMENT_OTHER): Payer: BLUE CROSS/BLUE SHIELD

## 2015-08-23 ENCOUNTER — Encounter (HOSPITAL_BASED_OUTPATIENT_CLINIC_OR_DEPARTMENT_OTHER): Payer: Self-pay

## 2015-08-23 ENCOUNTER — Emergency Department (HOSPITAL_BASED_OUTPATIENT_CLINIC_OR_DEPARTMENT_OTHER)
Admission: EM | Admit: 2015-08-23 | Discharge: 2015-08-23 | Disposition: A | Discharge status: Home / Self Care | Payer: BLUE CROSS/BLUE SHIELD | Attending: Emergency Medicine | Admitting: Emergency Medicine

## 2015-08-23 DIAGNOSIS — I4891 Unspecified atrial fibrillation: Secondary | ICD-10-CM | POA: Diagnosis not present

## 2015-08-23 DIAGNOSIS — S93601A Unspecified sprain of right foot, initial encounter: Secondary | ICD-10-CM | POA: Diagnosis not present

## 2015-08-23 DIAGNOSIS — I10 Essential (primary) hypertension: Secondary | ICD-10-CM | POA: Diagnosis not present

## 2015-08-23 DIAGNOSIS — Y9389 Activity, other specified: Secondary | ICD-10-CM | POA: Insufficient documentation

## 2015-08-23 DIAGNOSIS — J45909 Unspecified asthma, uncomplicated: Secondary | ICD-10-CM | POA: Insufficient documentation

## 2015-08-23 DIAGNOSIS — S99921A Unspecified injury of right foot, initial encounter: Secondary | ICD-10-CM | POA: Diagnosis present

## 2015-08-23 DIAGNOSIS — Y999 Unspecified external cause status: Secondary | ICD-10-CM | POA: Diagnosis not present

## 2015-08-23 DIAGNOSIS — Z87891 Personal history of nicotine dependence: Secondary | ICD-10-CM | POA: Insufficient documentation

## 2015-08-23 DIAGNOSIS — X501XXA Overexertion from prolonged static or awkward postures, initial encounter: Secondary | ICD-10-CM | POA: Diagnosis not present

## 2015-08-23 DIAGNOSIS — Y9289 Other specified places as the place of occurrence of the external cause: Secondary | ICD-10-CM | POA: Insufficient documentation

## 2015-08-23 DIAGNOSIS — J449 Chronic obstructive pulmonary disease, unspecified: Secondary | ICD-10-CM | POA: Insufficient documentation

## 2015-08-23 NOTE — ED Provider Notes (Signed)
CSN: [id number]     Arrival date & time 08/23/15  1302 History   First MD Initiated Contact with Patient 08/23/15 1320     Chief Complaint  Patient presents with  . Foot Injury     (Consider location/radiation/quality/duration/timing/severity/associated sxs/prior Treatment) HPI Comments: Patient is 70 year old female who presents with complaints of right foot pain. She states she was stepping down from the curb [employer] yesterday when her foot turned and she heard a crack. She is complaining of pain on the lateral aspect of the foot the toes.  Patient is a 70 y.o. female presenting with foot injury. The history is provided by the patient.  Foot Injury Location:  Foot Time since incident:  1 day Injury: yes   Foot location:  R foot Pain details:    Radiates to:  Does not radiate   Severity:  Moderate   Onset quality:  Sudden Chronicity:  New Prior injury to area:  No Relieved by:  Nothing Worsened by:  Bearing weight and activity   Past Medical History  Diagnosis Date  . Asthma   . COPD (chronic obstructive pulmonary disease) ([place])   . Irregular heart rate   . Atrial fibrillation ([place])   . Hypertension    Past Surgical History  Procedure Laterality Date  . Left foot reconstruction    . Tonsillectomy    . Cesarean section    . Open reduction internal fixation (orif) metacarpal Right 03/10/2015    Procedure: OPEN REDUCTION INTERNAL FIXATION (ORIF) RIGHT LONG  METACARPAL FRACTURE;  Surgeon: Leanora Cover, MD;  Location: [place];  Service: Orthopedics;  Laterality: Right;   No family history on file. Social History  Substance Use Topics  . Smoking status: Former Smoker    Types: Cigarettes  . Smokeless tobacco: Never Used  . Alcohol Use: Yes     Comment: wine daily   OB History    No data available     Review of Systems  All other systems reviewed and are negative.     Allergies  Review of patient's allergies indicates no known  allergies.  Home Medications   Prior to Admission medications   Medication Sig Start Date End Date Taking? Authorizing Provider  albuterol (PROVENTIL HFA;VENTOLIN HFA) 108 (90 BASE) MCG/ACT inhaler Inhale 2 puffs into the lungs every 4 (four) hours as needed for wheezing or shortness of breath.    Historical Provider, MD  budesonide-formoterol (SYMBICORT) 160-4.5 MCG/ACT inhaler Inhale 2 puffs into the lungs 2 (two) times daily.    Historical Provider, MD  diltiazem (CARDIZEM CD) 300 MG 24 hr capsule Take 300 mg by mouth daily.    Historical Provider, MD  DM-Doxylamine-Acetaminophen (VICKS NYQUIL COLD & FLU) 15-6.25-325 MG/15ML LIQD Take 30 mLs by mouth at bedtime as needed (for cough).    Historical Provider, MD  losartan-hydrochlorothiazide (HYZAAR) 50-12.5 MG per tablet Take 1 tablet by mouth daily.    Historical Provider, MD  Multiple Vitamin (MULTIVITAMIN) tablet Take 1 tablet by mouth daily.    Historical Provider, MD  oxyCODONE-acetaminophen (PERCOCET) 5-325 MG tablet 1-2 tabs po q6 hours prn pain 08/17/15   Margarita Mail, PA-C  pramipexole (MIRAPEX) 0.25 MG tablet Take 0.25 mg by mouth 3 (three) times daily.    Historical Provider, MD   BP 174/79 mmHg  Pulse 72  Temp(Src) 98.2 F (36.8 C) (Oral)  Resp 18  Ht 5\' 4"  (1.626 m)  Wt 151 lb (68.493 kg)  BMI 25.91 kg/m2  SpO2 97% Physical  Exam  Constitutional: She is oriented to person, place, and time. She appears well-developed and well-nourished. No distress.  HENT:  Head: Normocephalic and atraumatic.  Neck: Normal range of motion. Neck supple.  Musculoskeletal:  The right foot appears grossly normal. There is minimal swelling over the distal fifth metatarsal, however no deformity. Distal PMS is intact.  Neurological: She is alert and oriented to person, place, and time.  Skin: Skin is warm and dry. She is not diaphoretic.  Vitals reviewed.   ED Course  Procedures (including critical care time) Labs Review Labs Reviewed -  No data to display  Imaging Review No results found. I have personally reviewed and evaluated these images and lab results as part of my medical decision-making.    MDM   Final diagnoses:  None    X-rays of the foot are negative. This will be treated as a sprain. To follow-up with her primary Dr. if not improving in the next week.    Veryl Speak, MD 08/23/15 [phone]

## 2015-08-23 NOTE — ED Notes (Signed)
Right foot injury when stepped off curb at [place] yesterday-presents to triage in w/c

## 2015-08-23 NOTE — Discharge Instructions (Signed)
Rest. Weightbearing as tolerated.  Ibuprofen 600 mg every 6 hours as needed for pain.  Follow-up with your primary Dr. if not improving in the next week.   Foot Sprain A foot sprain is an injury to one of the strong bands of tissue (ligaments) that connect and support the many bones in your feet. The ligament can be stretched too much or it can tear. A tear can be either partial or complete. The severity of the sprain depends on how much of the ligament was damaged or torn. CAUSES A foot sprain is usually caused by suddenly twisting or pivoting your foot. RISK FACTORS This injury is more likely to occur in people who:  Play a sport, such as basketball or football.  Exercise or play a sport without warming up.  Start a new workout or sport.  Suddenly increase how long or hard they exercise or play a sport. SYMPTOMS Symptoms of this condition start soon after an injury and include:  Pain, especially in the arch of the foot.  Bruising.  Swelling.  Inability to walk or use the foot to support body weight. DIAGNOSIS This condition is diagnosed with a medical history and physical exam. You may also have imaging tests, such as:  X-rays to make sure there are no broken bones (fractures).  MRI to see if the ligament has torn. TREATMENT Treatment varies depending on the severity of your sprain. Mild sprains can be treated with rest, ice, compression, and elevation (RICE). If your ligament is overstretched or partially torn, treatment usually involves keeping your foot in a fixed position (immobilization) for a period of time. To help you do this, your health care provider will apply a bandage, splint, or walking boot to keep your foot from moving until it heals. You may also be advised to use crutches or a scooter for a few weeks to avoid bearing weight on your foot while it is healing. If your ligament is fully torn, you may need surgery to reconnect the ligament to the bone. After  surgery, a cast or splint will be applied and will need to stay on your foot while it heals. Your health care provider may also suggest exercises or physical therapy to strengthen your foot. HOME CARE INSTRUCTIONS If You Have a Bandage, Splint, or Walking Boot:  Wear it as directed by your health care provider. Remove it only as directed by your health care provider.  Loosen the bandage, splint, or walking boot if your toes become numb and tingle, or if they turn cold and blue. Bathing  If your health care provider approves bathing and showering, cover the bandage or splint with a watertight plastic bag to protect it from water. Do not let the bandage or splint get wet. Managing Pain, Stiffness, and Swelling   If directed, apply ice to the injured area:  Put ice in a plastic bag.  Place a towel between your skin and the bag.  Leave the ice on for 20 minutes, 2-3 times per day.  Move your toes often to avoid stiffness and to lessen swelling.  Raise (elevate) the injured area above the level of your heart while you are sitting or lying down. Driving  Do not drive or operate heavy machinery while taking pain medicine.  Do not drive while wearing a bandage, splint, or walking boot on a foot that you use for driving. Activity  Rest as directed by your health care provider.  Do not use the injured foot to support  your body weight until your health care provider says that you can. Use crutches or other supportive devices as directed by your health care provider.  Ask your health care provider what activities are safe for you. Gradually increase how much and how far you walk until your health care provider says it is safe to return to full activity.  Do any exercise or physical therapy as directed by your health care provider. General Instructions  If a splint was applied, do not put pressure on any part of it until it is fully hardened. This may take several hours.  Take medicines  only as directed by your health care provider. These include over-the-counter medicines and prescription medicines.  Keep all follow-up visits as directed by your health care provider. This is important.  When you can walk without pain, wear supportive shoes that have stiff soles. Do not wear flip-flops, and do not walk barefoot. SEEK MEDICAL CARE IF:  Your pain is not controlled with medicine.  Your bruising or swelling gets worse or does not get better with treatment.  Your splint or walking boot is damaged. SEEK IMMEDIATE MEDICAL CARE IF:  Your foot is numb or blue.  Your foot feels colder than normal.   This information is not intended to replace advice given to you by your health care provider. Make sure you discuss any questions you have with your health care provider.   Document Released: 09/07/2001 Document Revised: 08/02/2014 Document Reviewed: 01/19/2014 Elsevier Interactive Patient Education [employer].

## 2015-09-21 ENCOUNTER — Emergency Department (HOSPITAL_BASED_OUTPATIENT_CLINIC_OR_DEPARTMENT_OTHER)
Admission: EM | Admit: 2015-09-21 | Discharge: 2015-09-21 | Disposition: A | Discharge status: Home / Self Care | Payer: BLUE CROSS/BLUE SHIELD | Attending: Otolaryngology | Admitting: Otolaryngology

## 2015-09-21 ENCOUNTER — Encounter (HOSPITAL_BASED_OUTPATIENT_CLINIC_OR_DEPARTMENT_OTHER): Payer: Self-pay

## 2015-09-21 ENCOUNTER — Emergency Department (HOSPITAL_BASED_OUTPATIENT_CLINIC_OR_DEPARTMENT_OTHER): Payer: BLUE CROSS/BLUE SHIELD

## 2015-09-21 DIAGNOSIS — R221 Localized swelling, mass and lump, neck: Secondary | ICD-10-CM | POA: Insufficient documentation

## 2015-09-21 DIAGNOSIS — Z87891 Personal history of nicotine dependence: Secondary | ICD-10-CM | POA: Diagnosis not present

## 2015-09-21 DIAGNOSIS — J45909 Unspecified asthma, uncomplicated: Secondary | ICD-10-CM | POA: Insufficient documentation

## 2015-09-21 DIAGNOSIS — J449 Chronic obstructive pulmonary disease, unspecified: Secondary | ICD-10-CM | POA: Insufficient documentation

## 2015-09-21 DIAGNOSIS — I10 Essential (primary) hypertension: Secondary | ICD-10-CM | POA: Insufficient documentation

## 2015-09-21 DIAGNOSIS — Z79899 Other long term (current) drug therapy: Secondary | ICD-10-CM | POA: Insufficient documentation

## 2015-09-21 DIAGNOSIS — L0211 Cutaneous abscess of neck: Secondary | ICD-10-CM

## 2015-09-21 LAB — CBC WITH DIFFERENTIAL/PLATELET
Basophils Absolute: 0 10*3/uL (ref 0.0–0.1)
Basophils Relative: 0 %
Eosinophils Absolute: 0.1 10*3/uL (ref 0.0–0.7)
Eosinophils Relative: 1 %
HCT: 34.7 % — ABNORMAL LOW (ref 36.0–46.0)
Hemoglobin: 12.4 g/dL (ref 12.0–15.0)
Lymphocytes Relative: 20 %
Lymphs Abs: 2 10*3/uL (ref 0.7–4.0)
MCH: 32.1 pg (ref 26.0–34.0)
MCHC: 35.7 g/dL (ref 30.0–36.0)
MCV: 89.9 fL (ref 78.0–100.0)
Monocytes Absolute: 0.7 10*3/uL (ref 0.1–1.0)
Monocytes Relative: 7 %
Neutro Abs: 7.4 10*3/uL (ref 1.7–7.7)
Neutrophils Relative %: 72 %
Platelets: 421 10*3/uL — ABNORMAL HIGH (ref 150–400)
RBC: 3.86 MIL/uL — ABNORMAL LOW (ref 3.87–5.11)
RDW: 12.2 % (ref 11.5–15.5)
WBC: 10.1 10*3/uL (ref 4.0–10.5)

## 2015-09-21 LAB — BASIC METABOLIC PANEL
Anion gap: 10 (ref 5–15)
BUN: 17 mg/dL (ref 6–20)
CO2: 25 mmol/L (ref 22–32)
Calcium: 9.6 mg/dL (ref 8.9–10.3)
Chloride: 97 mmol/L — ABNORMAL LOW (ref 101–111)
Creatinine, Ser: 0.93 mg/dL (ref 0.44–1.00)
GFR calc Af Amer: >60 mL/min (ref >60)
GFR calc non Af Amer: >60 mL/min (ref >60)
Glucose, Bld: 100 mg/dL — ABNORMAL HIGH (ref 65–99)
Potassium: 3.3 mmol/L — ABNORMAL LOW (ref 3.5–5.1)
Sodium: 132 mmol/L — ABNORMAL LOW (ref 135–145)

## 2015-09-21 MED ORDER — CLINDAMYCIN HCL 300 MG PO CAPS: 300.0000 mg | ORAL_CAPSULE | Freq: Three times a day (TID) | ORAL | Status: DC

## 2015-09-21 MED ORDER — IOPAMIDOL (ISOVUE-300) INJECTION 61%
75.0000 mL | Freq: Once | INTRAVENOUS | Status: AC | PRN
  Administered 2015-09-21: 75 mL via INTRAVENOUS

## 2015-09-21 MED ORDER — OXYCODONE-ACETAMINOPHEN 5-325 MG PO TABS: 1.0000 | ORAL_TABLET | Freq: Four times a day (QID) | ORAL | Status: DC | PRN

## 2015-09-21 NOTE — ED Notes (Signed)
Dr. Bates at bedside 

## 2015-09-21 NOTE — ED Provider Notes (Signed)
CSN: [id number]     Arrival date & time 09/21/15  1624 History   First MD Initiated Contact with Patient 09/21/15 1634     Chief Complaint  Patient presents with  . Swelling      (Consider location/radiation/quality/duration/timing/severity/associated sxs/prior Treatment) The history is provided by the patient and medical records.   70 y.o. F with hx of AFIB, HTN, COPD, asthma, presenting to the ED for swelling above her right clavicle.  She states this has been present for the past week but has been progressively increasing in size.  She states area is largest in the morning and decreases some during the day but pain is worse at night time.States pain radiates deep into her neck, worse when turning her head to the right.  no numbness/weakness of right arm.  She denies any fever or chills. No known injury or trauma to her right clavicle or shoulder. She states she has continued working-- she works at [employer]. She denies any difficulty swallowing, sensation of throat swelling, or shortness of breath. No chest pain. Patient is not currently on any type of anticoagulation. She has no history of cancer.  VSS.  Past Medical History  Diagnosis Date  . Asthma   . COPD (chronic obstructive pulmonary disease) ([place])   . Irregular heart rate   . Atrial fibrillation ([place])   . Hypertension    Past Surgical History  Procedure Laterality Date  . Left foot reconstruction    . Tonsillectomy    . Cesarean section    . Open reduction internal fixation (orif) metacarpal Right 03/10/2015    Procedure: OPEN REDUCTION INTERNAL FIXATION (ORIF) RIGHT LONG  METACARPAL FRACTURE;  Surgeon: Leanora Cover, MD;  Location: [place];  Service: Orthopedics;  Laterality: Right;   No family history on file. Social History  Substance Use Topics  . Smoking status: Former Smoker    Types: Cigarettes  . Smokeless tobacco: Never Used  . Alcohol Use: Yes     Comment: wine daily   OB History     No data available     Review of Systems  Musculoskeletal: Positive for neck pain.  All other systems reviewed and are negative.     Allergies  Review of patient's allergies indicates no known allergies.  Home Medications   Prior to Admission medications   Medication Sig Start Date End Date Taking? Authorizing Provider  albuterol (PROVENTIL HFA;VENTOLIN HFA) 108 (90 BASE) MCG/ACT inhaler Inhale 2 puffs into the lungs every 4 (four) hours as needed for wheezing or shortness of breath.    Historical Provider, MD  budesonide-formoterol (SYMBICORT) 160-4.5 MCG/ACT inhaler Inhale 2 puffs into the lungs 2 (two) times daily.    Historical Provider, MD  diltiazem (CARDIZEM CD) 300 MG 24 hr capsule Take 300 mg by mouth daily.    Historical Provider, MD  losartan-hydrochlorothiazide (HYZAAR) 50-12.5 MG per tablet Take 1 tablet by mouth daily.    Historical Provider, MD  Multiple Vitamin (MULTIVITAMIN) tablet Take 1 tablet by mouth daily.    Historical Provider, MD   BP 141/76 mmHg  Pulse 72  Temp(Src) 98.2 F (36.8 C) (Oral)  Resp 18  Ht 5\' 4"  (1.626 m)  Wt 67.132 kg  BMI 25.39 kg/m2  SpO2 95%   Physical Exam  Constitutional: She is oriented to person, place, and time. She appears well-developed and well-nourished.  HENT:  Head: Normocephalic and atraumatic.  Mouth/Throat: Oropharynx is clear and moist.  Eyes: Conjunctivae and EOM are  normal. Pupils are equal, round, and reactive to light.  Neck: Normal range of motion.    full ROM of neck and right shoulder; no deformities of the clavicle No neck swelling, normal phonation without stridor, no tracheal deviation, handling secretions well, airway patent  Cardiovascular: Normal rate, regular rhythm and normal heart sounds.   Pulmonary/Chest: Effort normal and breath sounds normal.  Abdominal: Soft. Bowel sounds are normal.  Musculoskeletal: Normal range of motion.  Neurological: She is alert and oriented to person, place, and  time.  Skin: Skin is warm and dry.  Psychiatric: She has a normal mood and affect.  Nursing note and vitals reviewed.   ED Course  Procedures (including critical care time) Labs Review Labs Reviewed  CBC WITH DIFFERENTIAL/PLATELET - Abnormal; Notable for the following:    RBC 3.86 (*)    HCT 34.7 (*)    Platelets 421 (*)    All other components within normal limits  BASIC METABOLIC PANEL - Abnormal; Notable for the following:    Sodium 132 (*)    Potassium 3.3 (*)    Chloride 97 (*)    Glucose, Bld 100 (*)    All other components within normal limits    Imaging Review Ct Soft Tissue Neck W Contrast  09/21/2015  CLINICAL DATA:  70 year old hypertensive female presenting with right clavicle mass for the past week. This has become larger, red and warm to touch. Initial encounter. EXAM: CT NECK WITH CONTRAST TECHNIQUE: Multidetector CT imaging of the neck was performed using the standard protocol following the bolus administration of intravenous contrast. CONTRAST:  15mL ISOVUE-300 IOPAMIDOL (ISOVUE-300) INJECTION 61% COMPARISON:  None. FINDINGS: 5.6 x 3.6 x 3.8 cm necrotic appearing mass posterior superior aspect of the right clavicle (superior to the subclavian vessels and bordering the posterior lateral aspect of the right sternocleidomastoid muscle). This has an appearance which in the present clinical setting is most suspicious for an abscess with infiltration of surrounding fat planes and displacement surrounding vessels. Mass or hemorrhage felt to be less likely considerations. No obvious osteomyelitis of the clavicle although the patient is at risk for development of such. Pharynx and larynx: No worrisome laryngeal mass or inflammation. Salivary glands: No primary salivary gland mass or inflammation. Thyroid: Negative. Lymph nodes: Scattered normal size lymph nodes without adenopathy. Top-normal size left axillary lymph nodes. Vascular: Bilateral carotid bifurcation calcifications with  narrowing greater on the right (less than 70%) Limited intracranial: Negative. Visualized orbits: Negative. Mastoids and visualized paranasal sinuses: Minimal mucosal thickening left maxillary sinus. 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 Cervical kyphosis. Upper chest: No worrisome lung apical lesion. IMPRESSION: 5.6 x 3.6 x 3.8 cm necrotic appearing mass posterior superior aspect of the right clavicle has an appearance which in the present clinical setting is most suspicious for an abscess with infiltration of surrounding fat planes and displacement surrounding vessels. Mass or hemorrhage felt to be less likely considerations. Carotid bifurcation calcifications greater on the right with narrowing less than 70%. 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 Electronically Signed   By: Genia Del M.D.   On: 09/21/2015 18:34   I have personally reviewed and evaluated these images and lab results as part of my medical decision-making.   EKG Interpretation None      MDM   Final diagnoses:  Abscess of neck   70 year old female here with mass to her right mid clavicle  for the past week. She reports his been progressively increasing in size. She has no difficulty swallowing or shortness of breath. She is afebrile, nontoxic. Her airway is fully intact. She has normal phonation without stridor. She does have a masslike structure to the right mid clavicle which is firm and tender to palpation. There is no fluctuance or overlying skin changes. Her lab work is reassuring-- normal WBC count. CT scan of the neck suspicious for necrotic appearing mass that is felt to represent an abscess with infiltration of the surrounding fat planes and displacement of the underlying vessels. Mass or hematoma felt to be less likely. Case was discussed with on call ENT, Dr. Redmond Baseman--  recommends transfer to the [facility] ED for evaluation. Dr. Venora Maples and charge nurse Lenna Sciara in [facility] ED made aware of transfer.  Patient would prefer to go by private vehicle. As she has no apparent airway compromise at this time and VS are stable, I feel this is reasonable. Her daughter will drive her there. She is to remain NPO until eval by ENT.  Discussed plan with patient, she acknowledged understanding and agreed with plan of care.   Larene Pickett, PA-C 09/21/15 [place], DO 09/21/15 1926

## 2015-09-21 NOTE — ED Notes (Signed)
Dr.Bates made aware of pt's arrival

## 2015-09-21 NOTE — ED Notes (Signed)
Report given to [facility] ED

## 2015-09-21 NOTE — ED Notes (Signed)
C/o swollen area to right clavicle/neck area x 1 week-NAD-steady gait

## 2015-09-21 NOTE — Consult Note (Signed)
Reason for Consult:Neck mass Referring Physician: ER  Kim Blankenship is an 70 y.o. female.  HPI: 70 year old female started with right neck pain about two weeks ago.  For the past week, she has noticed a growing mass in the right supraclavicular area that is larger and more painful in the mornings.  She has had no treatment.  She presented to the ER where a neck CT scan was performed suggesting an abscess.  She has had no fevers and does not feel sick.  WBC is normal.  Past Medical History  Diagnosis Date  . Asthma   . COPD (chronic obstructive pulmonary disease) ([place])   . Irregular heart rate   . Atrial fibrillation ([place])   . Hypertension     Past Surgical History  Procedure Laterality Date  . Left foot reconstruction    . Tonsillectomy    . Cesarean section    . Open reduction internal fixation (orif) metacarpal Right 03/10/2015    Procedure: OPEN REDUCTION INTERNAL FIXATION (ORIF) RIGHT LONG  METACARPAL FRACTURE;  Surgeon: Leanora Cover, MD;  Location: [place];  Service: Orthopedics;  Laterality: Right;    No family history on file.  Social History:  reports that she has quit smoking. Her smoking use included Cigarettes. She has never used smokeless tobacco. She reports that she drinks alcohol. She reports that she does not use illicit drugs.  Allergies: No Known Allergies  Medications: I have reviewed the patient's current medications.  Results for orders placed or performed during the hospital encounter of 09/21/15 (from the past 48 hour(s))  CBC with Differential     Status: Abnormal   Collection Time: 09/21/15  4:48 PM  Result Value Ref Range   WBC 10.1 4.0 - 10.5 K/uL   RBC 3.86 (L) 3.87 - 5.11 MIL/uL   Hemoglobin 12.4 12.0 - 15.0 g/dL   HCT 34.7 (L) 36.0 - 46.0 %   MCV 89.9 78.0 - 100.0 fL   MCH 32.1 26.0 - 34.0 pg   MCHC 35.7 30.0 - 36.0 g/dL   RDW 12.2 11.5 - 15.5 %   Platelets 421 (H) 150 - 400 K/uL   Neutrophils Relative % 72 %   Neutro Abs  7.4 1.7 - 7.7 K/uL   Lymphocytes Relative 20 %   Lymphs Abs 2.0 0.7 - 4.0 K/uL   Monocytes Relative 7 %   Monocytes Absolute 0.7 0.1 - 1.0 K/uL   Eosinophils Relative 1 %   Eosinophils Absolute 0.1 0.0 - 0.7 K/uL   Basophils Relative 0 %   Basophils Absolute 0.0 0.0 - 0.1 K/uL  Basic metabolic panel     Status: Abnormal   Collection Time: 09/21/15  4:48 PM  Result Value Ref Range   Sodium 132 (L) 135 - 145 mmol/L   Potassium 3.3 (L) 3.5 - 5.1 mmol/L   Chloride 97 (L) 101 - 111 mmol/L   CO2 25 22 - 32 mmol/L   Glucose, Bld 100 (H) 65 - 99 mg/dL   BUN 17 6 - 20 mg/dL   Creatinine, Ser 0.93 0.44 - 1.00 mg/dL   Calcium 9.6 8.9 - 10.3 mg/dL   GFR calc non Af Amer >60 >60 mL/min   GFR calc Af Amer >60 >60 mL/min    Comment: (NOTE) The eGFR has been calculated using the CKD EPI equation. This calculation has not been validated in all clinical situations. eGFR's persistently <60 mL/min signify possible Chronic Kidney Disease.    Anion gap 10  5 - 15    Ct Soft Tissue Neck W Contrast  09/21/2015  CLINICAL DATA:  70 year old hypertensive female presenting with right clavicle mass for the past week. This has become larger, red and warm to touch. Initial encounter. EXAM: CT NECK WITH CONTRAST TECHNIQUE: Multidetector CT imaging of the neck was performed using the standard protocol following the bolus administration of intravenous contrast. CONTRAST:  86m ISOVUE-300 IOPAMIDOL (ISOVUE-300) INJECTION 61% COMPARISON:  None. FINDINGS: 5.6 x 3.6 x 3.8 cm necrotic appearing mass posterior superior aspect of the right clavicle (superior to the subclavian vessels and bordering the posterior lateral aspect of the right sternocleidomastoid muscle). This has an appearance which in the present clinical setting is most suspicious for an abscess with infiltration of surrounding fat planes and displacement surrounding vessels. Mass or hemorrhage felt to be less likely considerations. No obvious osteomyelitis of  the clavicle although the patient is at risk for development of such. Pharynx and larynx: No worrisome laryngeal mass or inflammation. Salivary glands: No primary salivary gland mass or inflammation. Thyroid: Negative. Lymph nodes: Scattered normal size lymph nodes without adenopathy. Top-normal size left axillary lymph nodes. Vascular: Bilateral carotid bifurcation calcifications with narrowing greater on the right (less than 70%) Limited intracranial: Negative. Visualized orbits: Negative. Mastoids and visualized paranasal sinuses: Minimal mucosal thickening left maxillary sinus. 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 Cervical kyphosis. Upper chest: No worrisome lung apical lesion. IMPRESSION: 5.6 x 3.6 x 3.8 cm necrotic appearing mass posterior superior aspect of the right clavicle has an appearance which in the present clinical setting is most suspicious for an abscess with infiltration of surrounding fat planes and displacement surrounding vessels. Mass or hemorrhage felt to be less likely considerations. Carotid bifurcation calcifications greater on the right with narrowing less than 70%. 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 Electronically Signed   By: SGenia DelM.D.   On: 09/21/2015 18:34    Review of Systems  Musculoskeletal: Positive for neck pain.  All other systems reviewed and are negative.  Blood pressure 146/62, pulse 69, temperature 97.5 F (36.4 C), temperature source Oral, resp. rate 16, height 5' 4"  (1.626 m), weight 67.132 kg (148 lb), SpO2 100 %. Physical Exam  Constitutional: She is oriented to person, place, and time. She appears well-developed and well-nourished. No distress.  HENT:  Head: Normocephalic and atraumatic.  Right Ear: External ear normal.  Left Ear: External ear normal.  Nose: Nose normal.  Mouth/Throat:  Oropharynx is clear and moist.  Eyes: Conjunctivae and EOM are normal. Pupils are equal, round, and reactive to light.  Neck: Normal range of motion.  Right supraclavicular moderately firm mass measures about 5 cm, moderately tender, no overlying redness or edema, no fluctuance.  Cardiovascular: Normal rate.   Respiratory: Effort normal.  Musculoskeletal: Normal range of motion.  Neurological: She is alert and oriented to person, place, and time. No cranial nerve deficit.  Skin: Skin is warm and dry.  Psychiatric: She has a normal mood and affect. Her behavior is normal. Judgment and thought content normal.    Assessment/Plan: Right supraclavicular neck mass I personally reviewed the neck CT scan.  In light of her lack of fever, normal WBC, and physical exam, I am inclined to feel that the mass more likely represents neoplasm.  I recommended fine needle aspiration rather than incision and drainage.  FNA cannot be performed in the ER so  she will follow-up in my office tomorrow.  I will prescribe clindamycin and pain medication in the meantime.  Federica Allport 09/21/2015, 9:23 PM

## 2015-09-21 NOTE — Discharge Instructions (Signed)
Go straight to the [facility] ED. Do not eat or drink on the way there. Dr. Redmond Baseman (ENT) will see you once you get there.

## 2015-11-06 ENCOUNTER — Emergency Department (HOSPITAL_COMMUNITY): Payer: BLUE CROSS/BLUE SHIELD

## 2015-11-06 ENCOUNTER — Observation Stay (HOSPITAL_COMMUNITY)
Admission: EM | Admit: 2015-11-06 | Discharge: 2015-11-07 | Disposition: A | Discharge status: Home / Self Care | Payer: BLUE CROSS/BLUE SHIELD | Attending: Internal Medicine | Admitting: Internal Medicine

## 2015-11-06 ENCOUNTER — Encounter (HOSPITAL_COMMUNITY): Payer: Self-pay | Admitting: Emergency Medicine

## 2015-11-06 DIAGNOSIS — Z7951 Long term (current) use of inhaled steroids: Secondary | ICD-10-CM | POA: Diagnosis not present

## 2015-11-06 DIAGNOSIS — I4891 Unspecified atrial fibrillation: Secondary | ICD-10-CM | POA: Diagnosis not present

## 2015-11-06 DIAGNOSIS — R079 Chest pain, unspecified: Secondary | ICD-10-CM | POA: Diagnosis present

## 2015-11-06 DIAGNOSIS — Z87891 Personal history of nicotine dependence: Secondary | ICD-10-CM | POA: Insufficient documentation

## 2015-11-06 DIAGNOSIS — Z79899 Other long term (current) drug therapy: Secondary | ICD-10-CM | POA: Diagnosis not present

## 2015-11-06 DIAGNOSIS — J449 Chronic obstructive pulmonary disease, unspecified: Secondary | ICD-10-CM | POA: Diagnosis not present

## 2015-11-06 DIAGNOSIS — I10 Essential (primary) hypertension: Secondary | ICD-10-CM | POA: Insufficient documentation

## 2015-11-06 DIAGNOSIS — Z23 Encounter for immunization: Secondary | ICD-10-CM | POA: Diagnosis not present

## 2015-11-06 DIAGNOSIS — Z792 Long term (current) use of antibiotics: Secondary | ICD-10-CM | POA: Diagnosis not present

## 2015-11-06 LAB — BASIC METABOLIC PANEL
Anion gap: 8 (ref 5–15)
BUN: 10 mg/dL (ref 6–20)
CO2: 24 mmol/L (ref 22–32)
Calcium: 8.6 mg/dL — ABNORMAL LOW (ref 8.9–10.3)
Chloride: 104 mmol/L (ref 101–111)
Creatinine, Ser: 0.95 mg/dL (ref 0.44–1.00)
GFR calc Af Amer: >60 mL/min (ref >60)
GFR calc non Af Amer: >60 mL/min (ref >60)
Glucose, Bld: 158 mg/dL — ABNORMAL HIGH (ref 65–99)
Potassium: 3.3 mmol/L — ABNORMAL LOW (ref 3.5–5.1)
Sodium: 136 mmol/L (ref 135–145)

## 2015-11-06 LAB — CBC WITH DIFFERENTIAL/PLATELET
Basophils Absolute: 0 10*3/uL (ref 0.0–0.1)
Basophils Relative: 0 %
Eosinophils Absolute: 0.1 10*3/uL (ref 0.0–0.7)
Eosinophils Relative: 1 %
HCT: 36.8 % (ref 36.0–46.0)
Hemoglobin: 12.3 g/dL (ref 12.0–15.0)
Lymphocytes Relative: 12 %
Lymphs Abs: 1.3 10*3/uL (ref 0.7–4.0)
MCH: 31.3 pg (ref 26.0–34.0)
MCHC: 33.4 g/dL (ref 30.0–36.0)
MCV: 93.6 fL (ref 78.0–100.0)
Monocytes Absolute: 0.7 10*3/uL (ref 0.1–1.0)
Monocytes Relative: 7 %
Neutro Abs: 8.5 10*3/uL — ABNORMAL HIGH (ref 1.7–7.7)
Neutrophils Relative %: 80 %
Platelets: 294 10*3/uL (ref 150–400)
RBC: 3.93 MIL/uL (ref 3.87–5.11)
RDW: 12.8 % (ref 11.5–15.5)
WBC: 10.6 10*3/uL — ABNORMAL HIGH (ref 4.0–10.5)

## 2015-11-06 LAB — I-STAT TROPONIN, ED: Troponin i, poc: 0 ng/mL (ref 0.00–0.08)

## 2015-11-06 MED ORDER — PRAMIPEXOLE DIHYDROCHLORIDE 0.25 MG PO TABS
0.2500 mg | ORAL_TABLET | Freq: Once | ORAL | Status: AC
  Administered 2015-11-07: 0.25 mg via ORAL
  Filled 2015-11-06: qty 1

## 2015-11-06 MED ORDER — PRAMIPEXOLE DIHYDROCHLORIDE 0.25 MG PO TABS
0.2500 mg | ORAL_TABLET | Freq: Every evening | ORAL | Status: DC | PRN
Start: 2015-11-06 — End: 2015-11-06

## 2015-11-06 NOTE — ED Provider Notes (Signed)
[facility] DEPT Provider Note   CSN: [id number] Arrival date & time: 11/06/15  2142  First Provider Contact:  First MD Initiated Contact with Patient 11/06/15 2005        History   Chief Complaint Chief Complaint  Patient presents with  . Chest Pain    HPI Kim Blankenship is a 70 y.o. female.  HPI  This is a patient with HTN, tobacco use, COPD who presents for Chest pain.  This woke her up from sleep about an hour PTA.  It was central chest, non-radiating, 10/10, crushing in nature, associated with: nausea, SOB, diaphoresis.  EMS gave ASA and NTG, and pain improved.  She denies history of heart attacks or CVA.    Past Medical History:  Diagnosis Date  . Asthma   . Atrial fibrillation ([place])   . COPD (chronic obstructive pulmonary disease) ([place])   . Hypertension   . Irregular heart rate     Patient Active Problem List   Diagnosis Date Noted  . COPD (chronic obstructive pulmonary disease) ([place]) 08/19/2013  . COPD exacerbation ([place]) 08/19/2013  . Abnormal EKG 08/19/2013  . Fever, unspecified 08/19/2013    Past Surgical History:  Procedure Laterality Date  . CESAREAN SECTION    . Left Foot Reconstruction    . OPEN REDUCTION INTERNAL FIXATION (ORIF) METACARPAL Right 03/10/2015   Procedure: OPEN REDUCTION INTERNAL FIXATION (ORIF) RIGHT LONG  METACARPAL FRACTURE;  Surgeon: Leanora Cover, MD;  Location: [facility];  Service: Orthopedics;  Laterality: Right;  . TONSILLECTOMY      OB History    No data available       Home Medications    Prior to Admission medications   Medication Sig Start Date End Date Taking? Authorizing Provider  albuterol (PROVENTIL HFA;VENTOLIN HFA) 108 (90 BASE) MCG/ACT inhaler Inhale 2 puffs into the lungs every 4 (four) hours as needed for wheezing or shortness of breath.    Historical Provider, MD  budesonide-formoterol (SYMBICORT) 160-4.5 MCG/ACT inhaler Inhale 2 puffs into the lungs 2 (two) times daily.    Historical  Provider, MD  clindamycin (CLEOCIN) 300 MG capsule Take 1 capsule (300 mg total) by mouth 3 (three) times daily. 09/21/15   Melida Quitter, MD  diltiazem (CARDIZEM CD) 300 MG 24 hr capsule Take 300 mg by mouth daily.    Historical Provider, MD  losartan-hydrochlorothiazide (HYZAAR) 50-12.5 MG per tablet Take 1 tablet by mouth daily.    Historical Provider, MD  Multiple Vitamin (MULTIVITAMIN) tablet Take 1 tablet by mouth daily.    Historical Provider, MD  oxyCODONE-acetaminophen (PERCOCET/ROXICET) 5-325 MG tablet Take 1-2 tablets by mouth every 6 (six) hours as needed for severe pain. 09/21/15   Melida Quitter, MD    Family History No family history on file.  Social History Social History  Substance Use Topics  . Smoking status: Former Smoker    Types: Cigarettes  . Smokeless tobacco: Never Used  . Alcohol use Yes     Comment: wine daily     Allergies   Review of patient's allergies indicates no known allergies.   Review of Systems Review of Systems  Constitutional: Positive for diaphoresis. Negative for chills and fever.  HENT: Negative for ear pain and sore throat.   Eyes: Negative for pain and visual disturbance.  Respiratory: Positive for shortness of breath. Negative for cough.   Cardiovascular: Positive for chest pain. Negative for palpitations.  Gastrointestinal: Positive for nausea. Negative for abdominal pain and vomiting.  Genitourinary: Negative for  dysuria and hematuria.  Musculoskeletal: Negative for arthralgias and back pain.  Skin: Negative for color change and rash.  Neurological: Negative for seizures and syncope.  All other systems reviewed and are negative.    Physical Exam Updated Vital Signs BP 124/62   Pulse 70   Temp 97.8 F (36.6 C) (Oral)   Resp 22   SpO2 95%   Physical Exam  Constitutional: She appears well-developed and well-nourished. No distress.  HENT:  Head: Normocephalic and atraumatic.  Eyes: Conjunctivae are normal.  Neck: Neck  supple.  Cardiovascular: Normal rate.  An irregularly irregular rhythm present.  No murmur heard. Pulmonary/Chest: Effort normal and breath sounds normal. No respiratory distress.  Abdominal: Soft. There is no tenderness.  Musculoskeletal: She exhibits no edema.  Neurological: She is alert.  Skin: Skin is warm and dry.  Psychiatric: She has a normal mood and affect.  Nursing note and vitals reviewed.    ED Treatments / Results  Labs (all labs ordered are listed, but only abnormal results are displayed) Labs Reviewed  CBC WITH DIFFERENTIAL/PLATELET  BASIC METABOLIC PANEL  [place], ED    EKG  EKG Interpretation None       Radiology No results found.  Procedures Procedures (including critical care time)  Medications Ordered in ED Medications - No data to display   Initial Impression / Assessment and Plan / ED Course  I have reviewed the triage vital signs and the nursing notes.  Pertinent labs & imaging results that were available during my care of the patient were reviewed by me and considered in my medical decision making (see chart for details).  Clinical Course    Patient presents for evaluation of chest pain.  HEART Pathway evaluation is high risk, 5.  Troponin negative x1.  EKG is NSR without ischemic changes.  Feel like PNA unlikely given CXR without infiltrates or effusion, no leukocytosis, no fever.  No mediastinal widening on CXR.  Doubt emergent intraabdominal pathology given benign abdominal exam and no abdominal symptoms.  Low risk [place].  Patient requires admission for ACS ruleout.  Final Clinical Impressions(s) / ED Diagnoses   Final diagnoses:  None    New Prescriptions New Prescriptions   No medications on file     Levada Schilling, MD 11/07/15 0007    Sharlett Iles, MD 11/10/15 1704

## 2015-11-06 NOTE — ED Notes (Signed)
Patient transported to X-ray 

## 2015-11-06 NOTE — ED Triage Notes (Signed)
Per EMS, pt from home with c/o central chest pain. Pain 10/10. Denies back pain, dizziness or shortness of breath. Pt c/o nausea given 4mg  zofran PTA. 1 nitro given with some relief. Chest pain 4/10 at time of arrival. Pt given 500cc fluid en route. BP-138/60, HR-64, SpO2-99% 2L

## 2015-11-07 ENCOUNTER — Observation Stay (HOSPITAL_BASED_OUTPATIENT_CLINIC_OR_DEPARTMENT_OTHER): Payer: BLUE CROSS/BLUE SHIELD

## 2015-11-07 ENCOUNTER — Observation Stay (HOSPITAL_COMMUNITY): Payer: BLUE CROSS/BLUE SHIELD

## 2015-11-07 ENCOUNTER — Encounter (HOSPITAL_COMMUNITY): Payer: Self-pay | Admitting: Internal Medicine

## 2015-11-07 DIAGNOSIS — R079 Chest pain, unspecified: Principal | ICD-10-CM

## 2015-11-07 LAB — NM MYOCAR MULTI W/SPECT W/WALL MOTION / EF
Estimated workload: 1 METS
Exercise duration (min): 5 min
Exercise duration (sec): 1 s
Peak HR: 76 {beats}/min
Rest HR: 61 {beats}/min

## 2015-11-07 LAB — TROPONIN I
Troponin I: <0.03 ng/mL (ref <0.03)
Troponin I: <0.03 ng/mL (ref <0.03)
Troponin I: <0.03 ng/mL (ref <0.03)

## 2015-11-07 MED ORDER — HYDROCHLOROTHIAZIDE 12.5 MG PO CAPS
12.5000 mg | ORAL_CAPSULE | Freq: Every day | ORAL | Status: DC
  Administered 2015-11-07: 12.5 mg via ORAL
  Filled 2015-11-07: qty 1

## 2015-11-07 MED ORDER — ENSURE ENLIVE PO LIQD: 237.0000 mL | Freq: Two times a day (BID) | ORAL | Status: DC

## 2015-11-07 MED ORDER — ONDANSETRON HCL 4 MG/2ML IJ SOLN: 4.0000 mg | Freq: Four times a day (QID) | INTRAMUSCULAR | Status: DC | PRN

## 2015-11-07 MED ORDER — ZOLPIDEM TARTRATE 5 MG PO TABS
5.0000 mg | ORAL_TABLET | Freq: Every evening | ORAL | Status: DC | PRN
  Administered 2015-11-07: 5 mg via ORAL
  Filled 2015-11-07: qty 1

## 2015-11-07 MED ORDER — REGADENOSON 0.4 MG/5ML IV SOLN
INTRAVENOUS | Status: AC
  Filled 2015-11-07: qty 5

## 2015-11-07 MED ORDER — MOMETASONE FURO-FORMOTEROL FUM 200-5 MCG/ACT IN AERO
2.0000 | INHALATION_SPRAY | Freq: Two times a day (BID) | RESPIRATORY_TRACT | Status: DC
  Administered 2015-11-07: 2 via RESPIRATORY_TRACT
  Filled 2015-11-07: qty 8.8

## 2015-11-07 MED ORDER — ENOXAPARIN SODIUM 40 MG/0.4ML ~~LOC~~ SOLN
40.0000 mg | SUBCUTANEOUS | Status: DC
  Administered 2015-11-07: 40 mg via SUBCUTANEOUS
  Filled 2015-11-07: qty 0.4

## 2015-11-07 MED ORDER — TECHNETIUM TC 99M TETROFOSMIN IV KIT
30.0000 | PACK | Freq: Once | INTRAVENOUS | Status: AC | PRN
  Administered 2015-11-07: 30 via INTRAVENOUS

## 2015-11-07 MED ORDER — DILTIAZEM HCL ER COATED BEADS 300 MG PO CP24
300.0000 mg | ORAL_CAPSULE | Freq: Every day | ORAL | Status: DC
  Administered 2015-11-07: 300 mg via ORAL
  Filled 2015-11-07: qty 1

## 2015-11-07 MED ORDER — LOSARTAN POTASSIUM-HCTZ 50-12.5 MG PO TABS: 1.0000 | ORAL_TABLET | Freq: Every day | ORAL | Status: DC

## 2015-11-07 MED ORDER — ALBUTEROL SULFATE (2.5 MG/3ML) 0.083% IN NEBU: 3.0000 mL | INHALATION_SOLUTION | RESPIRATORY_TRACT | Status: DC | PRN

## 2015-11-07 MED ORDER — PNEUMOCOCCAL VAC POLYVALENT 25 MCG/0.5ML IJ INJ
0.5000 mL | INJECTION | INTRAMUSCULAR | Status: AC
  Administered 2015-11-07: 0.5 mL via INTRAMUSCULAR

## 2015-11-07 MED ORDER — ACETAMINOPHEN 325 MG PO TABS
650.0000 mg | ORAL_TABLET | ORAL | Status: DC | PRN
Start: 2015-11-07 — End: 2015-11-08

## 2015-11-07 MED ORDER — TECHNETIUM TC 99M TETROFOSMIN IV KIT
10.0000 | PACK | Freq: Once | INTRAVENOUS | Status: AC | PRN
  Administered 2015-11-07: 10 via INTRAVENOUS

## 2015-11-07 MED ORDER — ENSURE ENLIVE PO LIQD: 237.0000 mL | Freq: Two times a day (BID) | ORAL | 12 refills | Status: DC

## 2015-11-07 MED ORDER — LOSARTAN POTASSIUM 50 MG PO TABS
50.0000 mg | ORAL_TABLET | Freq: Every day | ORAL | Status: DC
  Administered 2015-11-07: 50 mg via ORAL
  Filled 2015-11-07: qty 1

## 2015-11-07 MED ORDER — REGADENOSON 0.4 MG/5ML IV SOLN
0.4000 mg | Freq: Once | INTRAVENOUS | Status: AC
Start: 2015-11-07 — End: 2015-11-07
  Administered 2015-11-07: 0.4 mg via INTRAVENOUS
  Filled 2015-11-07: qty 5

## 2015-11-07 NOTE — Discharge Summary (Signed)
Kim Blankenship, is a 70 y.o. female  DOB [birth year]  MRN [id number].  Admission date:  11/06/2015  Admitting Physician  Etta Quill, DO  Discharge Date:  11/07/2015   Primary MD  Abigail Miyamoto, MD  Recommendations for primary care physician for things to follow:   Cp Stress test => negative for ischemia  Pafib (chads2vasc=2) Consider    Admission Diagnosis  Chest pain [R07.9] Chest pain, unspecified chest pain type [R07.9]   Discharge Diagnosis  Chest pain [R07.9] Chest pain, unspecified chest pain type [R07.9]    Active Problems:   COPD (chronic obstructive pulmonary disease) (HCC)   Chest pain      Past Medical History:  Diagnosis Date  . Asthma   . Atrial fibrillation ([place])   . COPD (chronic obstructive pulmonary disease) ([place])   . Hypertension   . Irregular heart rate     Past Surgical History:  Procedure Laterality Date  . CESAREAN SECTION    . Left Foot Reconstruction    . OPEN REDUCTION INTERNAL FIXATION (ORIF) METACARPAL Right 03/10/2015   Procedure: OPEN REDUCTION INTERNAL FIXATION (ORIF) RIGHT LONG  METACARPAL FRACTURE;  Surgeon: Leanora Cover, MD;  Location: [facility];  Service: Orthopedics;  Laterality: Right;  . TONSILLECTOMY         HPI  from the history and physical done on the day of admission:     70 y.o. female with medical history significant of COPD.  Patient presents to the ED for c/o chest pain.  Pain woke her up from sleep about 1 hr PTA in the ED.  Located in center of chest, non-radiating, 10/10 intensity, crushing in quality, associated with nausea, SOB, diaphoresis.  Got better with NTG.  ED Course: Trop negative, EKG unremarkable.    Hospital Course:     Pt has been chest pain free since arrival on the floor.  Trop negative x3.  Stress test negative for ischemia.        Follow UP  Follow-up Information    FRIED,  ROBERT L, MD Follow up in 1 week(s).   Specialty:  Family Medicine Contact information: [address] [place] [address] [phone]            Consults obtained - none  Discharge Condition: stable  Diet and Activity recommendation: See Discharge Instructions below  Discharge Instructions         Discharge Medications       Medication List    TAKE these medications   albuterol 108 (90 Base) MCG/ACT inhaler Commonly known as:  PROVENTIL HFA;VENTOLIN HFA Inhale 2 puffs into the lungs every 4 (four) hours as needed for wheezing or shortness of breath.   budesonide-formoterol 160-4.5 MCG/ACT inhaler Commonly known as:  SYMBICORT Inhale 2 puffs into the lungs 2 (two) times daily.   CARDIZEM CD 300 MG 24 hr capsule Generic drug:  diltiazem Take 300 mg by mouth daily.   losartan-hydrochlorothiazide 50-12.5 MG tablet Commonly known as:  HYZAAR Take 1 tablet by mouth daily.   multivitamin tablet Take 1 tablet by mouth daily.   pramipexole 0.25 MG tablet Commonly known as:  MIRAPEX Take 0.25 mg by mouth at bedtime as needed (restless legs).       Major procedures and Radiology Reports - PLEASE review detailed and final reports for all details, in brief -      Dg Chest 2 View  Result Date: 11/06/2015 CLINICAL DATA:  Central chest pain tonight. EXAM: CHEST  2 VIEW COMPARISON:  None. FINDINGS: The cardiomediastinal contours are normal. Linear atelectasis or scarring at the left lung base in the lingula. Pulmonary vasculature is normal. No consolidation, pleural effusion, or pneumothorax. No acute osseous abnormalities are seen. IMPRESSION: No active cardiopulmonary disease. Electronically Signed   By: Jeb Levering M.D.   On: 11/06/2015 22:44   Nm Myocar Multi W/spect W/wall Motion / Ef  Result Date: 11/07/2015 CLINICAL DATA:  Chest pain EXAM: MYOCARDIAL IMAGING WITH SPECT (REST AND PHARMACOLOGIC-STRESS) GATED LEFT VENTRICULAR WALL MOTION STUDY  LEFT VENTRICULAR EJECTION FRACTION TECHNIQUE: Standard myocardial SPECT imaging was performed after resting intravenous injection of 10 mCi Tc-73m tetrofosmin. Subsequently, intravenous infusion of Lexiscan was performed under the supervision of the Cardiology staff. At peak effect of the drug, 30 mCi Tc-68m tetrofosmin was injected intravenously and standard myocardial SPECT imaging was performed. Quantitative gated imaging was also performed to evaluate left ventricular wall motion, and estimate left ventricular ejection fraction. COMPARISON:  None. FINDINGS: Perfusion: Allowing for breast attenuation artifact, there are no perfusion defects. There is no evidence of stress-induced ischemia. Wall Motion: Normal left ventricular wall motion. No left ventricular dilation. Left Ventricular Ejection Fraction: 58 % End diastolic volume 92 ml End systolic volume 39 ml IMPRESSION: 1. Allowing for breast attenuation artifact, there are no perfusion defects and there is no evidence of stress-induced ischemia. 2. Normal left ventricular wall motion. 3. Left ventricular ejection fraction 58% 4. Non invasive risk stratification*: Low risk. *2012 Appropriate Use Criteria for Coronary Revascularization Focused Update: J Am Coll Cardiol. [id number]. http://content.airportbarriers.com.aspx?articleid=1201161 Electronically Signed   By: Marybelle Killings M.D.   On: 11/07/2015 16:13    Micro Results     No results found for this or any previous visit (from the past 240 hour(s)).     Today   Subjective    Leandra Barnwell today has no headache,no chest abdominal pain,no new weakness tingling or numbness, feels much better wants to go home today.    Objective   Blood pressure (!) 129/55, pulse (!) 56, temperature 97.9 F (36.6 C), temperature source Oral, resp. rate 16, height 5\' 5"  (1.651 m), weight 63.2 kg (139 lb 4.8 oz), SpO2 95 %.   Intake/Output Summary (Last 24 hours) at 11/07/15 1701 Last data  filed at 11/07/15 1300  Gross per 24 hour  Intake              240 ml  Output                0 ml  Net              240 ml    Exam Awake Alert, Oriented x 3, No new F.N deficits, Normal affect [place].AT,PERRAL Supple Neck,No JVD, No cervical lymphadenopathy appriciated.  Symmetrical Chest wall movement, Good air movement bilaterally, CTAB RRR,No Gallops,Rubs or new Murmurs, No Parasternal Heave +ve B.Sounds, Abd Soft, Non tender, No organomegaly appriciated, No rebound -guarding or rigidity. No Cyanosis, Clubbing or edema, No new Rash  or bruise   Data Review   CBC w Diff:  Lab Results  Component Value Date   WBC 10.6 (H) 11/06/2015   HGB 12.3 11/06/2015   HCT 36.8 11/06/2015   PLT 294 11/06/2015   LYMPHOPCT 12 11/06/2015   MONOPCT 7 11/06/2015   EOSPCT 1 11/06/2015   BASOPCT 0 11/06/2015    CMP:  Lab Results  Component Value Date   NA 136 11/06/2015   K 3.3 (L) 11/06/2015   CL 104 11/06/2015   CO2 24 11/06/2015   BUN 10 11/06/2015   CREATININE 0.95 11/06/2015   PROT 7.4 08/19/2013   ALBUMIN 4.4 08/19/2013   BILITOT 0.6 08/19/2013   ALKPHOS 69 08/19/2013   AST 27 08/19/2013   ALT 28 08/19/2013  .   Total Time in preparing paper work, data evaluation and todays exam - 43 minutes  Jani Gravel M.D on 11/07/2015 at 5:01 PM  [facility]   Office  [phone]

## 2015-11-07 NOTE — Progress Notes (Signed)
Stress test is low risk. No evidence of stress-induced ischemia. LV EF of 58%. Normal LV wall motion.

## 2015-11-07 NOTE — H&P (Signed)
History and Physical    Kim Blankenship [id number] DOB: April 19, 1945 DOA: 11/06/2015   PCP: Abigail Miyamoto, MD Chief Complaint:  Chief Complaint  Patient presents with  . Chest Pain    HPI: Kim Blankenship is a 70 y.o. female with medical history significant of COPD.  Patient presents to the ED for c/o chest pain.  Pain woke her up from sleep about 1 hr PTA in the ED.  Located in center of chest, non-radiating, 10/10 intensity, crushing in quality, associated with nausea, SOB, diaphoresis.  Got better with NTG.  ED Course: Trop negative, EKG unremarkable.  Review of Systems: As per HPI otherwise 10 point review of systems negative.    Past Medical History:  Diagnosis Date  . Asthma   . Atrial fibrillation ([place])   . COPD (chronic obstructive pulmonary disease) ([place])   . Hypertension   . Irregular heart rate     Past Surgical History:  Procedure Laterality Date  . CESAREAN SECTION    . Left Foot Reconstruction    . OPEN REDUCTION INTERNAL FIXATION (ORIF) METACARPAL Right 03/10/2015   Procedure: OPEN REDUCTION INTERNAL FIXATION (ORIF) RIGHT LONG  METACARPAL FRACTURE;  Surgeon: Leanora Cover, MD;  Location: [facility];  Service: Orthopedics;  Laterality: Right;  . TONSILLECTOMY       reports that she has quit smoking. Her smoking use included Cigarettes. She has never used smokeless tobacco. She reports that she drinks alcohol. She reports that she does not use drugs.  No Known Allergies  No family history on file. No early onset CAD   Prior to Admission medications   Medication Sig Start Date End Date Taking? Authorizing Provider  albuterol (PROVENTIL HFA;VENTOLIN HFA) 108 (90 BASE) MCG/ACT inhaler Inhale 2 puffs into the lungs every 4 (four) hours as needed for wheezing or shortness of breath.   Yes Historical Provider, MD  budesonide-formoterol (SYMBICORT) 160-4.5 MCG/ACT inhaler Inhale 2 puffs into the lungs 2 (two) times daily.   Yes Historical Provider, MD   diltiazem (CARDIZEM CD) 300 MG 24 hr capsule Take 300 mg by mouth daily.   Yes Historical Provider, MD  losartan-hydrochlorothiazide (HYZAAR) 50-12.5 MG per tablet Take 1 tablet by mouth daily.   Yes Historical Provider, MD  Multiple Vitamin (MULTIVITAMIN) tablet Take 1 tablet by mouth daily.   Yes Historical Provider, MD  pramipexole (MIRAPEX) 0.25 MG tablet Take 0.25 mg by mouth at bedtime as needed (restless legs).   Yes Historical Provider, MD    Physical Exam: Vitals:   11/06/15 2248 11/06/15 2315 11/06/15 2330 11/06/15 2345  BP:  131/62 131/61 123/60  Pulse:  69 75 70  Resp:  16 21 20   Temp:      TempSrc:      SpO2: 95% 94% 96% 97%      Constitutional: NAD, calm, comfortable Eyes: PERRL, lids and conjunctivae normal ENMT: Mucous membranes are moist. Posterior pharynx clear of any exudate or lesions.Normal dentition.  Neck: normal, supple, no masses, no thyromegaly Respiratory: clear to auscultation bilaterally, no wheezing, no crackles. Normal respiratory effort. No accessory muscle use.  Cardiovascular: Regular rate and rhythm, no murmurs / rubs / gallops. No extremity edema. 2+ pedal pulses. No carotid bruits.  Abdomen: no tenderness, no masses palpated. No hepatosplenomegaly. Bowel sounds positive.  Musculoskeletal: no clubbing / cyanosis. No joint deformity upper and lower extremities. Good ROM, no contractures. Normal muscle tone.  Skin: no rashes, lesions, ulcers. No induration Neurologic: CN 2-12 grossly intact. Sensation intact, DTR  normal. Strength 5/5 in all 4.  Psychiatric: Normal judgment and insight. Alert and oriented x 3. Normal mood.    Labs on Admission: I have personally reviewed following labs and imaging studies  CBC:  Recent Labs Lab 11/06/15 2251  WBC 10.6*  NEUTROABS 8.5*  HGB 12.3  HCT 36.8  MCV 93.6  PLT [id number]   Basic Metabolic Panel:  Recent Labs Lab 11/06/15 2251  NA 136  K 3.3*  CL 104  CO2 24  GLUCOSE 158*  BUN 10  CREATININE  0.95  CALCIUM 8.6*   GFR: CrCl cannot be calculated (Unknown ideal weight.). Liver Function Tests: No results for input(s): AST, ALT, ALKPHOS, BILITOT, PROT, ALBUMIN in the last 168 hours. No results for input(s): LIPASE, AMYLASE in the last 168 hours. No results for input(s): AMMONIA in the last 168 hours. Coagulation Profile: No results for input(s): INR, PROTIME in the last 168 hours. Cardiac Enzymes: No results for input(s): CKTOTAL, CKMB, CKMBINDEX, TROPONINI in the last 168 hours. BNP (last 3 results) No results for input(s): PROBNP in the last 8760 hours. HbA1C: No results for input(s): HGBA1C in the last 72 hours. CBG: No results for input(s): GLUCAP in the last 168 hours. Lipid Profile: No results for input(s): CHOL, HDL, LDLCALC, TRIG, CHOLHDL, LDLDIRECT in the last 72 hours. Thyroid Function Tests: No results for input(s): TSH, T4TOTAL, FREET4, T3FREE, THYROIDAB in the last 72 hours. Anemia Panel: No results for input(s): VITAMINB12, FOLATE, FERRITIN, TIBC, IRON, RETICCTPCT in the last 72 hours. Urine analysis: No results found for: COLORURINE, APPEARANCEUR, LABSPEC, PHURINE, GLUCOSEU, HGBUR, BILIRUBINUR, KETONESUR, PROTEINUR, UROBILINOGEN, NITRITE, LEUKOCYTESUR Sepsis Labs: @LABRCNTIP (procalcitonin:4,lacticidven:4) )No results found for this or any previous visit (from the past 240 hour(s)).   Radiological Exams on Admission: Dg Chest 2 View  Result Date: 11/06/2015 CLINICAL DATA:  Central chest pain tonight. EXAM: CHEST  2 VIEW COMPARISON:  None. FINDINGS: The cardiomediastinal contours are normal. Linear atelectasis or scarring at the left lung base in the lingula. Pulmonary vasculature is normal. No consolidation, pleural effusion, or pneumothorax. No acute osseous abnormalities are seen. IMPRESSION: No active cardiopulmonary disease. Electronically Signed   By: Jeb Levering M.D.   On: 11/06/2015 22:44    EKG: Independently reviewed.  Assessment/Plan Active  Problems:   Chest pain   CP -  CP obs pathway  Serial trops  NPO  Tele monitor  Stress test in AM ordered  HTN - continue home meds   DVT prophylaxis: Lovenox Code Status: Full Family Communication: No family in room Consults called: None Admission status: Admit to obs   [place], [facility] Pager [phone] from 7PM-7AM  If 7AM-7PM, please contact the day physician for the patient [email or web address] Password [email or web address]  11/07/2015, 12:16 AM

## 2015-11-07 NOTE — ED Notes (Signed)
Attempted report x1. 

## 2015-11-07 NOTE — Discharge Instructions (Signed)

## 2015-11-07 NOTE — Progress Notes (Signed)
Discharge instructions given and patient verbalized understanding. 

## 2015-11-07 NOTE — Progress Notes (Signed)
Patient presented for Lexiscan. Tolerated procedure well. Pending final stress imaging result.  

## 2015-11-07 NOTE — Progress Notes (Signed)
Pt. Admitted to 2w 14 from ER via stretcher and walked to bed; alert and oriented x4; instructed on call bell and valuables- hospital not responsible for any brought in; informed that she's NPO for stress test.

## 2015-11-07 NOTE — Care Management Obs Status (Signed)
[facility] NOTIFICATION   Patient Details  Name: Kim Blankenship MRN: [id number] Date of Birth: [birth year]   Medicare Observation Status Notification Given:  Yes    Dawayne Patricia, RN 11/07/2015, 12:42 PM

## 2015-11-07 NOTE — Plan of Care (Signed)
Problem: Education: Goal: Knowledge of [facility] Education information/materials will improve Outcome: Progressing Discuss hospital policies with pt.- fall prevention, hospital not responsible for valuables brought to hospital; pain control, NPO and explained why- for Stress test this am.  Problem: Pain Managment: Goal: General experience of comfort will improve Outcome: Progressing No c/o's pain.

## 2015-12-14 ENCOUNTER — Other Ambulatory Visit (HOSPITAL_COMMUNITY): Payer: Self-pay | Admitting: Family Medicine

## 2015-12-14 DIAGNOSIS — G459 Transient cerebral ischemic attack, unspecified: Secondary | ICD-10-CM

## 2015-12-14 DIAGNOSIS — R479 Unspecified speech disturbances: Secondary | ICD-10-CM

## 2015-12-14 DIAGNOSIS — R42 Dizziness and giddiness: Secondary | ICD-10-CM

## 2015-12-19 ENCOUNTER — Ambulatory Visit (HOSPITAL_COMMUNITY)
Admission: RE | Admit: 2015-12-19 | Discharge: 2015-12-19 | Disposition: A | Discharge status: Home / Self Care | Payer: BLUE CROSS/BLUE SHIELD | Source: Ambulatory Visit | Attending: Family Medicine | Admitting: Family Medicine

## 2015-12-19 ENCOUNTER — Ambulatory Visit (HOSPITAL_COMMUNITY): Admission: RE | Admit: 2015-12-19 | Payer: BLUE CROSS/BLUE SHIELD | Source: Ambulatory Visit

## 2015-12-19 DIAGNOSIS — I6782 Cerebral ischemia: Secondary | ICD-10-CM | POA: Diagnosis not present

## 2015-12-19 DIAGNOSIS — R479 Unspecified speech disturbances: Secondary | ICD-10-CM | POA: Insufficient documentation

## 2015-12-19 MED ORDER — GADOBENATE DIMEGLUMINE 529 MG/ML IV SOLN
13.0000 mL | Freq: Once | INTRAVENOUS | Status: AC | PRN
  Administered 2015-12-19: 13 mL via INTRAVENOUS

## 2015-12-25 ENCOUNTER — Ambulatory Visit (HOSPITAL_COMMUNITY): Admission: RE | Admit: 2015-12-25 | Payer: BLUE CROSS/BLUE SHIELD | Source: Ambulatory Visit

## 2016-01-01 ENCOUNTER — Ambulatory Visit (HOSPITAL_COMMUNITY): Admission: RE | Admit: 2016-01-01 | Payer: BLUE CROSS/BLUE SHIELD | Source: Ambulatory Visit

## 2016-01-09 ENCOUNTER — Ambulatory Visit (HOSPITAL_BASED_OUTPATIENT_CLINIC_OR_DEPARTMENT_OTHER)
Admission: RE | Admit: 2016-01-09 | Discharge: 2016-01-09 | Disposition: A | Discharge status: Home / Self Care | Payer: BLUE CROSS/BLUE SHIELD | Source: Ambulatory Visit | Attending: Family Medicine | Admitting: Family Medicine

## 2016-01-09 ENCOUNTER — Ambulatory Visit (HOSPITAL_COMMUNITY)
Admission: RE | Admit: 2016-01-09 | Discharge: 2016-01-09 | Disposition: A | Discharge status: Home / Self Care | Payer: BLUE CROSS/BLUE SHIELD | Source: Ambulatory Visit | Attending: Family Medicine | Admitting: Family Medicine

## 2016-01-09 DIAGNOSIS — R479 Unspecified speech disturbances: Secondary | ICD-10-CM | POA: Insufficient documentation

## 2016-01-09 DIAGNOSIS — I501 Left ventricular failure: Secondary | ICD-10-CM | POA: Diagnosis not present

## 2016-01-09 DIAGNOSIS — I351 Nonrheumatic aortic (valve) insufficiency: Secondary | ICD-10-CM | POA: Insufficient documentation

## 2016-01-09 DIAGNOSIS — G459 Transient cerebral ischemic attack, unspecified: Secondary | ICD-10-CM

## 2016-01-09 DIAGNOSIS — I6523 Occlusion and stenosis of bilateral carotid arteries: Secondary | ICD-10-CM | POA: Diagnosis not present

## 2016-01-09 DIAGNOSIS — R42 Dizziness and giddiness: Secondary | ICD-10-CM | POA: Diagnosis not present

## 2016-01-09 LAB — VAS US CAROTID
LEFT ECA DIAS: -9 cm/s
LEFT VERTEBRAL DIAS: -10 cm/s
Left CCA dist dias: -20 cm/s
Left CCA dist sys: -67 cm/s
Left CCA prox dias: 16 cm/s
Left CCA prox sys: 107 cm/s
Left ICA dist dias: -24 cm/s
Left ICA dist sys: -67 cm/s
Left ICA prox dias: -25 cm/s
Left ICA prox sys: -94 cm/s
RIGHT ECA DIAS: -6 cm/s
RIGHT VERTEBRAL DIAS: -12 cm/s
Right CCA prox dias: 12 cm/s
Right CCA prox sys: 84 cm/s
Right cca dist sys: -57 cm/s

## 2016-01-09 NOTE — Progress Notes (Signed)
VASCULAR LAB PRELIMINARY  PRELIMINARY  PRELIMINARY  PRELIMINARY  Carotid duplex completed.    Preliminary report:  Bilateral:  1-39% ICA stenosis.  Vertebral artery flow is antegrade.     Trey Bebee, RVS 01/09/2016, 1:50 PM

## 2016-01-09 NOTE — Progress Notes (Signed)
  Echocardiogram 2D Echocardiogram has been performed.  Kim Blankenship 01/09/2016, 3:12 PM

## 2016-02-09 ENCOUNTER — Encounter (HOSPITAL_COMMUNITY)
Admission: EM | Disposition: A | Discharge status: Home / Self Care | Payer: Self-pay | Source: Home / Self Care | Attending: Family Medicine

## 2016-02-09 ENCOUNTER — Inpatient Hospital Stay (HOSPITAL_COMMUNITY): Payer: BLUE CROSS/BLUE SHIELD | Admitting: Certified Registered Nurse Anesthetist

## 2016-02-09 ENCOUNTER — Emergency Department (HOSPITAL_COMMUNITY): Payer: BLUE CROSS/BLUE SHIELD

## 2016-02-09 ENCOUNTER — Inpatient Hospital Stay (HOSPITAL_COMMUNITY)
Admission: EM | Admit: 2016-02-09 | Discharge: 2016-02-11 | DRG: 470 | Disposition: A | Discharge status: Home / Self Care | Payer: BLUE CROSS/BLUE SHIELD | Attending: Nephrology | Admitting: Nephrology

## 2016-02-09 ENCOUNTER — Encounter (HOSPITAL_COMMUNITY): Payer: Self-pay | Admitting: Emergency Medicine

## 2016-02-09 ENCOUNTER — Inpatient Hospital Stay (HOSPITAL_COMMUNITY): Payer: BLUE CROSS/BLUE SHIELD

## 2016-02-09 DIAGNOSIS — Z79899 Other long term (current) drug therapy: Secondary | ICD-10-CM | POA: Diagnosis not present

## 2016-02-09 DIAGNOSIS — Z419 Encounter for procedure for purposes other than remedying health state, unspecified: Secondary | ICD-10-CM

## 2016-02-09 DIAGNOSIS — Z9889 Other specified postprocedural states: Secondary | ICD-10-CM

## 2016-02-09 DIAGNOSIS — I4891 Unspecified atrial fibrillation: Secondary | ICD-10-CM | POA: Diagnosis present

## 2016-02-09 DIAGNOSIS — I481 Persistent atrial fibrillation: Secondary | ICD-10-CM | POA: Diagnosis not present

## 2016-02-09 DIAGNOSIS — Z66 Do not resuscitate: Secondary | ICD-10-CM | POA: Diagnosis present

## 2016-02-09 DIAGNOSIS — J449 Chronic obstructive pulmonary disease, unspecified: Secondary | ICD-10-CM | POA: Diagnosis present

## 2016-02-09 DIAGNOSIS — Z8673 Personal history of transient ischemic attack (TIA), and cerebral infarction without residual deficits: Secondary | ICD-10-CM

## 2016-02-09 DIAGNOSIS — W19XXXA Unspecified fall, initial encounter: Secondary | ICD-10-CM | POA: Diagnosis present

## 2016-02-09 DIAGNOSIS — D72829 Elevated white blood cell count, unspecified: Secondary | ICD-10-CM | POA: Diagnosis present

## 2016-02-09 DIAGNOSIS — Z87891 Personal history of nicotine dependence: Secondary | ICD-10-CM | POA: Diagnosis not present

## 2016-02-09 DIAGNOSIS — J439 Emphysema, unspecified: Secondary | ICD-10-CM

## 2016-02-09 DIAGNOSIS — I10 Essential (primary) hypertension: Secondary | ICD-10-CM

## 2016-02-09 DIAGNOSIS — D62 Acute posthemorrhagic anemia: Secondary | ICD-10-CM | POA: Diagnosis not present

## 2016-02-09 DIAGNOSIS — S72001A Fracture of unspecified part of neck of right femur, initial encounter for closed fracture: Principal | ICD-10-CM | POA: Diagnosis present

## 2016-02-09 HISTORY — PX: TOTAL HIP ARTHROPLASTY: SHX124

## 2016-02-09 LAB — ABO/RH: ABO/RH(D): O NEG

## 2016-02-09 LAB — CBC WITH DIFFERENTIAL/PLATELET
Basophils Absolute: 0 10*3/uL (ref 0.0–0.1)
Basophils Relative: 1 %
Eosinophils Absolute: 0.2 10*3/uL (ref 0.0–0.7)
Eosinophils Relative: 2 %
HCT: 37.6 % (ref 36.0–46.0)
Hemoglobin: 13.6 g/dL (ref 12.0–15.0)
Lymphocytes Relative: 15 %
Lymphs Abs: 1.3 10*3/uL (ref 0.7–4.0)
MCH: 32.1 pg (ref 26.0–34.0)
MCHC: 36.2 g/dL — ABNORMAL HIGH (ref 30.0–36.0)
MCV: 88.7 fL (ref 78.0–100.0)
Monocytes Absolute: 0.7 10*3/uL (ref 0.1–1.0)
Monocytes Relative: 7 %
Neutro Abs: 6.6 10*3/uL (ref 1.7–7.7)
Neutrophils Relative %: 75 %
Platelets: 300 10*3/uL (ref 150–400)
RBC: 4.24 MIL/uL (ref 3.87–5.11)
RDW: 12.8 % (ref 11.5–15.5)
WBC: 8.8 10*3/uL (ref 4.0–10.5)

## 2016-02-09 LAB — COMPREHENSIVE METABOLIC PANEL
ALT: 28 U/L (ref 14–54)
AST: 30 U/L (ref 15–41)
Albumin: 4.3 g/dL (ref 3.5–5.0)
Alkaline Phosphatase: 68 U/L (ref 38–126)
Anion gap: 8 (ref 5–15)
BUN: 12 mg/dL (ref 6–20)
CO2: 27 mmol/L (ref 22–32)
Calcium: 9.1 mg/dL (ref 8.9–10.3)
Chloride: 99 mmol/L — ABNORMAL LOW (ref 101–111)
Creatinine, Ser: 0.82 mg/dL (ref 0.44–1.00)
GFR calc Af Amer: >60 mL/min (ref >60)
GFR calc non Af Amer: >60 mL/min (ref >60)
Glucose, Bld: 104 mg/dL — ABNORMAL HIGH (ref 65–99)
Potassium: 3.3 mmol/L — ABNORMAL LOW (ref 3.5–5.1)
Sodium: 134 mmol/L — ABNORMAL LOW (ref 135–145)
Total Bilirubin: 1 mg/dL (ref 0.3–1.2)
Total Protein: 7.1 g/dL (ref 6.5–8.1)

## 2016-02-09 LAB — URINALYSIS, ROUTINE W REFLEX MICROSCOPIC
Bilirubin Urine: NEGATIVE
Glucose, UA: NEGATIVE mg/dL
Hgb urine dipstick: NEGATIVE
Ketones, ur: NEGATIVE mg/dL
Nitrite: NEGATIVE
Protein, ur: NEGATIVE mg/dL
Specific Gravity, Urine: 1.014 (ref 1.005–1.030)
pH: 6 (ref 5.0–8.0)

## 2016-02-09 LAB — TYPE AND SCREEN
ABO/RH(D): O NEG
Antibody Screen: NEGATIVE

## 2016-02-09 LAB — URINE MICROSCOPIC-ADD ON

## 2016-02-09 LAB — PROTIME-INR
INR: 0.88
Prothrombin Time: 11.9 seconds (ref 11.4–15.2)

## 2016-02-09 SURGERY — INSERTION, INTRAMEDULLARY ROD, FEMUR
Anesthesia: General | Laterality: Right

## 2016-02-09 SURGERY — ARTHROPLASTY, HIP, TOTAL, ANTERIOR APPROACH
Anesthesia: Spinal | Laterality: Right

## 2016-02-09 MED ORDER — SODIUM CHLORIDE 0.9 % IJ SOLN
INTRAMUSCULAR | Status: AC
  Filled 2016-02-09: qty 50

## 2016-02-09 MED ORDER — SORBITOL 70 % SOLN: 30.0000 mL | Freq: Every day | Status: DC | PRN

## 2016-02-09 MED ORDER — MORPHINE SULFATE (PF) 2 MG/ML IV SOLN: 0.5000 mg | INTRAVENOUS | Status: DC | PRN

## 2016-02-09 MED ORDER — LOSARTAN POTASSIUM 50 MG PO TABS
100.0000 mg | ORAL_TABLET | Freq: Every day | ORAL | Status: DC
  Administered 2016-02-09 – 2016-02-10 (×2): 100 mg via ORAL
  Filled 2016-02-09 (×2): qty 2

## 2016-02-09 MED ORDER — ONDANSETRON HCL 4 MG PO TABS: 4.0000 mg | ORAL_TABLET | Freq: Four times a day (QID) | ORAL | Status: DC | PRN

## 2016-02-09 MED ORDER — SODIUM CHLORIDE 0.9 % IV SOLN: Freq: Once | INTRAVENOUS | Status: DC

## 2016-02-09 MED ORDER — HYDROMORPHONE HCL 1 MG/ML IJ SOLN: 1.0000 mg | INTRAMUSCULAR | Status: DC | PRN

## 2016-02-09 MED ORDER — DOCUSATE SODIUM 100 MG PO CAPS
100.0000 mg | ORAL_CAPSULE | Freq: Two times a day (BID) | ORAL | Status: DC
  Administered 2016-02-09 – 2016-02-11 (×4): 100 mg via ORAL
  Filled 2016-02-09 (×4): qty 1

## 2016-02-09 MED ORDER — DEXAMETHASONE SODIUM PHOSPHATE 10 MG/ML IJ SOLN
8.0000 mg | Freq: Once | INTRAMUSCULAR | Status: AC
  Administered 2016-02-09: 8 mg via INTRAVENOUS

## 2016-02-09 MED ORDER — DIPHENHYDRAMINE HCL 12.5 MG/5ML PO ELIX: 12.5000 mg | ORAL_SOLUTION | ORAL | Status: DC | PRN

## 2016-02-09 MED ORDER — OXYCODONE HCL 5 MG PO TABS: 5.0000 mg | ORAL_TABLET | ORAL | Status: DC | PRN

## 2016-02-09 MED ORDER — ACETAMINOPHEN 650 MG RE SUPP: 650.0000 mg | Freq: Four times a day (QID) | RECTAL | Status: DC | PRN

## 2016-02-09 MED ORDER — METOCLOPRAMIDE HCL 5 MG PO TABS: 5.0000 mg | ORAL_TABLET | Freq: Three times a day (TID) | ORAL | Status: DC | PRN

## 2016-02-09 MED ORDER — PROPOFOL 500 MG/50ML IV EMUL
INTRAVENOUS | Status: DC | PRN
  Administered 2016-02-09: 25 ug/kg/min via INTRAVENOUS

## 2016-02-09 MED ORDER — TRANEXAMIC ACID 1000 MG/10ML IV SOLN
INTRAVENOUS | Status: DC | PRN
  Administered 2016-02-09: 2000 mg via TOPICAL

## 2016-02-09 MED ORDER — TRANEXAMIC ACID 1000 MG/10ML IV SOLN
2000.0000 mg | INTRAVENOUS | Status: DC
  Filled 2016-02-09: qty 20

## 2016-02-09 MED ORDER — METHOCARBAMOL 500 MG PO TABS
500.0000 mg | ORAL_TABLET | Freq: Four times a day (QID) | ORAL | Status: DC | PRN
  Administered 2016-02-09: 500 mg via ORAL
  Filled 2016-02-09: qty 1

## 2016-02-09 MED ORDER — BUPIVACAINE IN DEXTROSE 0.75-8.25 % IT SOLN
INTRATHECAL | Status: DC | PRN
Start: 2016-02-09 — End: 2016-02-09
  Administered 2016-02-09: 1.8 mL via INTRATHECAL

## 2016-02-09 MED ORDER — FLEET ENEMA 7-19 GM/118ML RE ENEM: 1.0000 | ENEMA | Freq: Once | RECTAL | Status: DC | PRN

## 2016-02-09 MED ORDER — DEXAMETHASONE SODIUM PHOSPHATE 10 MG/ML IJ SOLN
INTRAMUSCULAR | Status: AC
  Filled 2016-02-09: qty 1

## 2016-02-09 MED ORDER — PROMETHAZINE HCL 25 MG/ML IJ SOLN: 6.2500 mg | INTRAMUSCULAR | Status: DC | PRN

## 2016-02-09 MED ORDER — CEFAZOLIN SODIUM-DEXTROSE 2-4 GM/100ML-% IV SOLN
INTRAVENOUS | Status: AC
  Filled 2016-02-09: qty 100

## 2016-02-09 MED ORDER — HYDROCHLOROTHIAZIDE 25 MG PO TABS
25.0000 mg | ORAL_TABLET | Freq: Every day | ORAL | Status: DC
  Administered 2016-02-09 – 2016-02-10 (×2): 25 mg via ORAL
  Filled 2016-02-09 (×2): qty 1

## 2016-02-09 MED ORDER — GABAPENTIN 300 MG PO CAPS
300.0000 mg | ORAL_CAPSULE | Freq: Once | ORAL | Status: DC
  Filled 2016-02-09: qty 1

## 2016-02-09 MED ORDER — BUPIVACAINE HCL (PF) 0.5 % IJ SOLN
INTRAMUSCULAR | Status: AC
  Filled 2016-02-09: qty 30

## 2016-02-09 MED ORDER — BISACODYL 5 MG PO TBEC: 5.0000 mg | DELAYED_RELEASE_TABLET | Freq: Every day | ORAL | Status: DC | PRN

## 2016-02-09 MED ORDER — MIDAZOLAM HCL 2 MG/2ML IJ SOLN
INTRAMUSCULAR | Status: AC
  Filled 2016-02-09: qty 2

## 2016-02-09 MED ORDER — METHOCARBAMOL 1000 MG/10ML IJ SOLN: 500.0000 mg | Freq: Four times a day (QID) | INTRAVENOUS | Status: DC | PRN

## 2016-02-09 MED ORDER — ONDANSETRON HCL 4 MG/2ML IJ SOLN
INTRAMUSCULAR | Status: DC | PRN
  Administered 2016-02-09: 4 mg via INTRAVENOUS

## 2016-02-09 MED ORDER — CELECOXIB 200 MG PO CAPS
200.0000 mg | ORAL_CAPSULE | Freq: Two times a day (BID) | ORAL | Status: DC
Start: 2016-02-09 — End: 2016-02-11
  Administered 2016-02-09 – 2016-02-11 (×4): 200 mg via ORAL
  Filled 2016-02-09 (×4): qty 1

## 2016-02-09 MED ORDER — LOSARTAN POTASSIUM-HCTZ 100-25 MG PO TABS: 1.0000 | ORAL_TABLET | Freq: Every day | ORAL | Status: DC

## 2016-02-09 MED ORDER — DEXAMETHASONE SODIUM PHOSPHATE 10 MG/ML IJ SOLN
10.0000 mg | Freq: Once | INTRAMUSCULAR | Status: AC
  Administered 2016-02-10: 10 mg via INTRAVENOUS
  Filled 2016-02-09: qty 1

## 2016-02-09 MED ORDER — METOCLOPRAMIDE HCL 5 MG/ML IJ SOLN: 5.0000 mg | Freq: Three times a day (TID) | INTRAMUSCULAR | Status: DC | PRN

## 2016-02-09 MED ORDER — LACTATED RINGERS IV SOLN
INTRAVENOUS | Status: DC
  Administered 2016-02-09 (×3): via INTRAVENOUS

## 2016-02-09 MED ORDER — METHOCARBAMOL 500 MG PO TABS: 500.0000 mg | ORAL_TABLET | Freq: Two times a day (BID) | ORAL | 0 refills | Status: DC

## 2016-02-09 MED ORDER — ALUM & MAG HYDROXIDE-SIMETH 200-200-20 MG/5ML PO SUSP: 30.0000 mL | ORAL | Status: DC | PRN

## 2016-02-09 MED ORDER — GABAPENTIN 300 MG PO CAPS
300.0000 mg | ORAL_CAPSULE | Freq: Three times a day (TID) | ORAL | Status: DC
  Administered 2016-02-09 – 2016-02-11 (×5): 300 mg via ORAL
  Filled 2016-02-09 (×5): qty 1

## 2016-02-09 MED ORDER — SENNOSIDES-DOCUSATE SODIUM 8.6-50 MG PO TABS: 1.0000 | ORAL_TABLET | Freq: Every evening | ORAL | Status: DC | PRN

## 2016-02-09 MED ORDER — CEFAZOLIN SODIUM-DEXTROSE 2-4 GM/100ML-% IV SOLN
2.0000 g | INTRAVENOUS | Status: AC
  Administered 2016-02-09: 2 g via INTRAVENOUS
  Filled 2016-02-09: qty 100

## 2016-02-09 MED ORDER — MOMETASONE FURO-FORMOTEROL FUM 200-5 MCG/ACT IN AERO
2.0000 | INHALATION_SPRAY | Freq: Two times a day (BID) | RESPIRATORY_TRACT | Status: DC
  Administered 2016-02-09 – 2016-02-11 (×4): 2 via RESPIRATORY_TRACT
  Filled 2016-02-09: qty 8.8

## 2016-02-09 MED ORDER — DILTIAZEM HCL ER BEADS 300 MG PO CP24
300.0000 mg | ORAL_CAPSULE | Freq: Every day | ORAL | Status: DC
  Administered 2016-02-10 – 2016-02-11 (×2): 300 mg via ORAL
  Filled 2016-02-09 (×2): qty 1

## 2016-02-09 MED ORDER — KCL IN DEXTROSE-NACL 20-5-0.45 MEQ/L-%-% IV SOLN
INTRAVENOUS | Status: DC
  Administered 2016-02-09 – 2016-02-10 (×2): via INTRAVENOUS
  Filled 2016-02-09 (×3): qty 1000

## 2016-02-09 MED ORDER — PHENOL 1.4 % MT LIQD
1.0000 | OROMUCOSAL | Status: DC | PRN
Start: 2016-02-09 — End: 2016-02-11

## 2016-02-09 MED ORDER — BUPIVACAINE LIPOSOME 1.3 % IJ SUSP
20.0000 mL | Freq: Once | INTRAMUSCULAR | Status: AC
  Administered 2016-02-09: 20 mL
  Filled 2016-02-09: qty 20

## 2016-02-09 MED ORDER — ALBUTEROL SULFATE (2.5 MG/3ML) 0.083% IN NEBU: 3.0000 mL | INHALATION_SOLUTION | RESPIRATORY_TRACT | Status: DC | PRN

## 2016-02-09 MED ORDER — FENTANYL CITRATE (PF) 100 MCG/2ML IJ SOLN
INTRAMUSCULAR | Status: DC | PRN
  Administered 2016-02-09 (×2): 50 ug via INTRAVENOUS

## 2016-02-09 MED ORDER — ASPIRIN EC 325 MG PO TBEC: 325.0000 mg | DELAYED_RELEASE_TABLET | Freq: Two times a day (BID) | ORAL | 0 refills | Status: DC

## 2016-02-09 MED ORDER — OXYCODONE-ACETAMINOPHEN 5-325 MG PO TABS: 1.0000 | ORAL_TABLET | ORAL | 0 refills | Status: DC | PRN

## 2016-02-09 MED ORDER — MENTHOL 3 MG MT LOZG: 1.0000 | LOZENGE | OROMUCOSAL | Status: DC | PRN

## 2016-02-09 MED ORDER — MECLIZINE HCL 25 MG PO TABS: 25.0000 mg | ORAL_TABLET | Freq: Every day | ORAL | Status: DC | PRN

## 2016-02-09 MED ORDER — BUPIVACAINE HCL (PF) 0.5 % IJ SOLN
INTRAMUSCULAR | Status: DC | PRN
  Administered 2016-02-09: 30 mL

## 2016-02-09 MED ORDER — FENTANYL CITRATE (PF) 100 MCG/2ML IJ SOLN
INTRAMUSCULAR | Status: AC
  Filled 2016-02-09: qty 2

## 2016-02-09 MED ORDER — ASPIRIN EC 325 MG PO TBEC
325.0000 mg | DELAYED_RELEASE_TABLET | Freq: Every day | ORAL | Status: DC
  Administered 2016-02-10 – 2016-02-11 (×2): 325 mg via ORAL
  Filled 2016-02-09 (×2): qty 1

## 2016-02-09 MED ORDER — ONDANSETRON HCL 4 MG/2ML IJ SOLN
INTRAMUSCULAR | Status: AC
  Filled 2016-02-09: qty 2

## 2016-02-09 MED ORDER — HYDROCODONE-ACETAMINOPHEN 5-325 MG PO TABS
1.0000 | ORAL_TABLET | Freq: Four times a day (QID) | ORAL | Status: DC | PRN
  Administered 2016-02-09 – 2016-02-10 (×2): 2 via ORAL
  Administered 2016-02-10: 1 via ORAL
  Filled 2016-02-09 (×2): qty 2
  Filled 2016-02-09: qty 1
  Filled 2016-02-09: qty 2

## 2016-02-09 MED ORDER — PRAMIPEXOLE DIHYDROCHLORIDE 0.25 MG PO TABS: 0.2500 mg | ORAL_TABLET | Freq: Every evening | ORAL | Status: DC | PRN

## 2016-02-09 MED ORDER — ACETAMINOPHEN 325 MG PO TABS: 650.0000 mg | ORAL_TABLET | Freq: Four times a day (QID) | ORAL | Status: DC | PRN

## 2016-02-09 MED ORDER — PROPOFOL 10 MG/ML IV BOLUS
INTRAVENOUS | Status: AC
  Filled 2016-02-09: qty 40

## 2016-02-09 MED ORDER — ONDANSETRON HCL 4 MG/2ML IJ SOLN: 4.0000 mg | Freq: Four times a day (QID) | INTRAMUSCULAR | Status: DC | PRN

## 2016-02-09 MED ORDER — MIDAZOLAM HCL 5 MG/5ML IJ SOLN
INTRAMUSCULAR | Status: DC | PRN
  Administered 2016-02-09: 1 mg via INTRAVENOUS

## 2016-02-09 MED ORDER — ENOXAPARIN SODIUM 30 MG/0.3ML ~~LOC~~ SOLN
30.0000 mg | SUBCUTANEOUS | Status: DC
  Administered 2016-02-10 – 2016-02-11 (×2): 30 mg via SUBCUTANEOUS
  Filled 2016-02-09 (×2): qty 0.3

## 2016-02-09 MED ORDER — HYDROCODONE-ACETAMINOPHEN 5-325 MG PO TABS
1.0000 | ORAL_TABLET | Freq: Once | ORAL | Status: AC
  Administered 2016-02-09: 1 via ORAL
  Filled 2016-02-09: qty 1

## 2016-02-09 SURGICAL SUPPLY — 34 items
BAG DECANTER FOR FLEXI CONT (MISCELLANEOUS) ×2 IMPLANT
BLADE SURG ROTATE 9660 (MISCELLANEOUS) ×2 IMPLANT
CAPT HIP TOTAL 2 ×2 IMPLANT
COVER PERINEAL POST (MISCELLANEOUS) ×2 IMPLANT
COVER SURGICAL LIGHT HANDLE (MISCELLANEOUS) ×2 IMPLANT
DRAPE C-ARM 42X120 X-RAY (DRAPES) ×2 IMPLANT
DRAPE STERI IOBAN 125X83 (DRAPES) ×2 IMPLANT
DRAPE U-SHAPE 47X51 STRL (DRAPES) ×4 IMPLANT
DRSG AQUACEL AG ADV 3.5X10 (GAUZE/BANDAGES/DRESSINGS) ×2 IMPLANT
DURAPREP 26ML APPLICATOR (WOUND CARE) ×2 IMPLANT
ELECT BLADE 6.5 EXT (BLADE) ×2 IMPLANT
ELECT REM PT RETURN 9FT ADLT (ELECTROSURGICAL) ×2
ELECTRODE REM PT RTRN 9FT ADLT (ELECTROSURGICAL) ×1 IMPLANT
FACESHIELD WRAPAROUND (MASK) ×8 IMPLANT
GLOVE BIO SURGEON STRL SZ7.5 (GLOVE) ×8 IMPLANT
GLOVE BIO SURGEON STRL SZ8.5 (GLOVE) ×2 IMPLANT
GOWN STRL REUS W/ TWL LRG LVL3 (GOWN DISPOSABLE) ×4 IMPLANT
GOWN STRL REUS W/TWL LRG LVL3 (GOWN DISPOSABLE) ×4
KIT BASIN OR (CUSTOM PROCEDURE TRAY) ×2 IMPLANT
KIT ROOM TURNOVER WLOR (KITS) ×2 IMPLANT
MANIFOLD NEPTUNE II (INSTRUMENTS) ×2 IMPLANT
NEEDLE HYPO 22GX1.5 SAFETY (NEEDLE) ×4 IMPLANT
NS IRRIG 1000ML POUR BTL (IV SOLUTION) ×2 IMPLANT
PACK TOTAL JOINT (CUSTOM PROCEDURE TRAY) ×2 IMPLANT
SAW OSC TIP CART 19.5X105X1.3 (SAW) ×2 IMPLANT
SUT VIC AB 1 CTX 36 (SUTURE) ×1
SUT VIC AB 1 CTX36XBRD ANBCTR (SUTURE) ×1 IMPLANT
SUT VIC AB 2-0 CT1 27 (SUTURE) ×2
SUT VIC AB 2-0 CT1 TAPERPNT 27 (SUTURE) ×2 IMPLANT
SUT VIC AB 3-0 PS2 18 (SUTURE) ×1
SUT VIC AB 3-0 PS2 18XBRD (SUTURE) ×1 IMPLANT
SYR CONTROL 10ML LL (SYRINGE) ×4 IMPLANT
TRAY FOLEY CATH 14FR (SET/KITS/TRAYS/PACK) ×2 IMPLANT
WATER STERILE IRR 1000ML POUR (IV SOLUTION) ×4 IMPLANT

## 2016-02-09 NOTE — Consult Note (Signed)
Reason for Consult: Angulated impacted right femoral neck fracture Referring Physician: Dr. Jeneen Rinks ED  Kim Blankenship is an 70 y.o. female.  HPI: 70 year old community ambulator who was running down the stairs to help her adult daughter who is having a seizure tripped and fell and sustained an impacted displaced right femoral neck fracture. She was unable to get up and walk EMS was called and she was transported to [facility]. She denies any loss of consciousness. Patient denies any other injuries. She last ate at 0 7:30 this morning and had a muffin. She has had surgery in the past including open reduction internal fixation of a metacarpal fracture last year no problems with anesthesia.  Past Medical History:  Diagnosis Date  . Asthma   . Atrial fibrillation ([place])   . COPD (chronic obstructive pulmonary disease) ([place])   . Hypertension   . Irregular heart rate     Past Surgical History:  Procedure Laterality Date  . CESAREAN SECTION    . Left Foot Reconstruction    . OPEN REDUCTION INTERNAL FIXATION (ORIF) METACARPAL Right 03/10/2015   Procedure: OPEN REDUCTION INTERNAL FIXATION (ORIF) RIGHT LONG  METACARPAL FRACTURE;  Surgeon: Leanora Cover, MD;  Location: [facility];  Service: Orthopedics;  Laterality: Right;  . TONSILLECTOMY      History reviewed. No pertinent family history.  Social History:  reports that she has quit smoking. Her smoking use included Cigarettes. She has never used smokeless tobacco. She reports that she drinks alcohol. She reports that she does not use drugs.  Allergies: No Known Allergies  Medications: I have reviewed the patient's current medications.  Results for orders placed or performed during the hospital encounter of 02/09/16 (from the past 48 hour(s))  CBC with Differential/Platelet     Status: Abnormal   Collection Time: 02/09/16 12:45 PM  Result Value Ref Range   WBC 8.8 4.0 - 10.5 K/uL   RBC 4.24 3.87 - 5.11 MIL/uL   Hemoglobin 13.6 12.0 - 15.0 g/dL   HCT 37.6 36.0 - 46.0 %   MCV 88.7 78.0 - 100.0 fL   MCH 32.1 26.0 - 34.0 pg   MCHC 36.2 (H) 30.0 - 36.0 g/dL   RDW 12.8 11.5 - 15.5 %   Platelets 300 150 - 400 K/uL   Neutrophils Relative % 75 %   Neutro Abs 6.6 1.7 - 7.7 K/uL   Lymphocytes Relative 15 %   Lymphs Abs 1.3 0.7 - 4.0 K/uL   Monocytes Relative 7 %   Monocytes Absolute 0.7 0.1 - 1.0 K/uL   Eosinophils Relative 2 %   Eosinophils Absolute 0.2 0.0 - 0.7 K/uL   Basophils Relative 1 %   Basophils Absolute 0.0 0.0 - 0.1 K/uL    Dg Pelvis 1-2 Views  Result Date: 02/09/2016 CLINICAL DATA:  Pain following fall EXAM: PELVIS - 1-2 VIEW COMPARISON:  None. FINDINGS: There is a focal lucency in the subcapital femoral neck region, concerning for potential fracture in this area. No other evidence of fracture. No dislocation. There is slight narrowing of each hip joint, symmetric. IMPRESSION: Concern for subcapital femoral neck fracture on the right. Advise dedicated right hip images to further evaluate. Mild symmetric narrowing both hip joints. No dislocation. Electronically Signed   By: Lowella Grip III M.D.   On: 02/09/2016 11:19   Dg Chest Port 1 View  Result Date: 02/09/2016 CLINICAL DATA:  Pre op; SOB due to pain in right hip' hx asthma, COPD, a fib, HTN;  smoker EXAM: PORTABLE CHEST 1 VIEW COMPARISON:  11/06/2015 FINDINGS: The cardiac silhouette is normal in size. No mediastinal or hilar masses. No evidence of adenopathy. Lungs are hyperexpanded but clear. No pleural effusion or pneumothorax. Skeletal structures are demineralized but grossly intact. IMPRESSION: No active disease. Electronically Signed   By: Lajean Manes M.D.   On: 02/09/2016 12:45   Dg Hip Unilat W Or Wo Pelvis 2-3 Views Right  Result Date: 02/09/2016 CLINICAL DATA:  Fall down steps at home last night complains of bilat hip pain, worse on right, pelvis xray done earlier EXAM: DG HIP (WITH OR WITHOUT PELVIS) 2-3V RIGHT  COMPARISON:  None. FINDINGS: There is a subcapital fracture of the right femoral neck. Fracture is nondisplaced. There is valgus angulation. No other fractures. Hip joint is normally spaced and aligned. Bones are demineralized. Soft tissue edema is seen lateral to the right proximal femur/ hip. IMPRESSION: 1. Nondisplaced, subcapital fracture of the right femoral neck with valgus angulation. Electronically Signed   By: Lajean Manes M.D.   On: 02/09/2016 12:13    ROS Blood pressure 128/64, pulse 69, temperature 97.5 F (36.4 C), temperature source Oral, resp. rate 18, SpO2 92 %., patient has a history of intermittent atrial fibrillation under the care of Dr. both freed. She is not on anticoagulation at this time. She denies any chest pains or shortness of breath. Physical Exam: Patient is tender to palpation over the right greater trochanter. She is to centimeter short right leg versus left leg and the right leg is externally rotated 40 in relation to the left leg. 1+ swelling to the right hip. Any attempts at movement of the right hip causes severe pain. Sensation is intact to both feet toes are downgoing pulses are intact toes are pink and well perfused. Nontender to palpation over both knees and both ankles. Full range of motion of the upper extremities. I personally reviewed her x-rays on [facility] system this is a valgus impacted fracture on the AP with 2 cm of shortening and slight displacement along the medial calcar laterally about 2 mm. On the frog lateral view the femoral head is rotated posteriorly about 40 and is unstable.  Assessment: Active 70 year old woman with unstable valgus impacted posteriorly angulated right femoral neck fracture.   Plan: Because she is quite active taking care of an adult child with seizure disorder as well as grandchildren and is working at [employer] home improvement total of arthroplasty is probably the wisest choice for her at this time. It will allow  full weightbearing and return to work within 4-6 weeks. Attempting to 10 this fracture has a failure rate of over 50% and is probably not a good choice for her. These options were discussed at length as well as the risks and benefits. She would like to proceed with hip replacement sometime this afternoon when our time is available. It looks like that will be around 1800 hrs. today 02/09/16. She will be evaluated and admitted by the hospitalist with preoperative clearance.   Deseree Zemaitis J 02/09/2016, 1:16 PM

## 2016-02-09 NOTE — H&P (Signed)
[facility] History and Physical  Kim Blankenship [id number] DOB: [birth year] DOA: 02/09/2016  Referring physician: Jeneen Rinks ed PCP: Abigail Miyamoto, MD  Specialists: Brigid Re  Chief Complaint: fall  HPI:   70 y/o ? ?TIA in 12/2015 Afib chad2vasc2=4 not on consistent AC htn COPd Prior smoker Htn Was runnign downstairs to attned to daughter who wa shaving a Sz when tripped and fell Accidental No syncopy no cp  No othe rissue before  Immediate pain Brought to ED Found to have subcapital hip #  Dr. Mayer Camel ortho consulted  Work at [employer] walks 6-9 km daily Can walk up a flight of stairs without issue  Previously form [place]-used to work with Teacher, adult education to adult daughte riwht Epilepsy  MOthe rhad Pos-t op surg issue sFather had 2 HA's  NKDA  Past Medical History:  Diagnosis Date  . Asthma   . Atrial fibrillation ([place])   . COPD (chronic obstructive pulmonary disease) ([place])   . Hypertension   . Irregular heart rate    Past Surgical History:  Procedure Laterality Date  . CESAREAN SECTION    . Left Foot Reconstruction    . OPEN REDUCTION INTERNAL FIXATION (ORIF) METACARPAL Right 03/10/2015   Procedure: OPEN REDUCTION INTERNAL FIXATION (ORIF) RIGHT LONG  METACARPAL FRACTURE;  Surgeon: Leanora Cover, MD;  Location: [facility];  Service: Orthopedics;  Laterality: Right;  . TONSILLECTOMY     Social History:  Social History   Social History Narrative  . No narrative on file    Prior to Admission medications   Medication Sig Start Date End Date Taking? Authorizing Provider  albuterol (PROVENTIL HFA;VENTOLIN HFA) 108 (90 BASE) MCG/ACT inhaler Inhale 2 puffs into the lungs every 4 (four) hours as needed for wheezing or shortness of breath.   Yes Historical Provider, MD  budesonide-formoterol (SYMBICORT) 160-4.5 MCG/ACT inhaler Inhale 2 puffs into the lungs 2 (two) times daily.   Yes Historical Provider, MD  diltiazem (TIAZAC) 300 MG 24 hr  capsule Take 300 mg by mouth daily. 01/09/16  Yes Historical Provider, MD  ibuprofen (ADVIL,MOTRIN) 200 MG tablet Take 800 mg by mouth every 6 (six) hours as needed for fever, headache, mild pain, moderate pain or cramping.   Yes Historical Provider, MD  losartan-hydrochlorothiazide (HYZAAR) 100-25 MG tablet Take 1 tablet by mouth at bedtime.  01/09/16  Yes Historical Provider, MD  meclizine (ANTIVERT) 25 MG tablet Take 25 mg by mouth daily as needed (for vertigo).  12/12/15  Yes Historical Provider, MD  Multiple Vitamin (MULTIVITAMIN) tablet Take 1 tablet by mouth daily with breakfast.    Yes Historical Provider, MD  pramipexole (MIRAPEX) 0.25 MG tablet Take 0.25 mg by mouth at bedtime as needed (for restless legs).    Yes Historical Provider, MD   Physical Exam: Vitals:   02/09/16 0945 02/09/16 1223  BP: 137/62 128/64  Pulse: 67 69  Resp: 18 18  Temp: 97.5 F (36.4 C)   TempSrc: Oral   SpO2: 94% 92%   eomi slighlty built No pallor no ict Mallampati 1, no SM LAN No jvd  no bruit s1 s 2 rrr abd soft nt nd no rebound Skin no edema nor rash MSK-RLE ext rotated Able to lift LLE oob Mood euthymic Neuro gorssl yintact power 5 5 major muscle goups  Labs on Admission:  Basic Metabolic Panel:  Recent Labs Lab 02/09/16 1245  NA 134*  K 3.3*  CL 99*  CO2 27  GLUCOSE 104*  BUN 12  CREATININE 0.82  CALCIUM 9.1   Liver Function Tests:  Recent Labs Lab 02/09/16 1245  AST 30  ALT 28  ALKPHOS 68  BILITOT 1.0  PROT 7.1  ALBUMIN 4.3   No results for input(s): LIPASE, AMYLASE in the last 168 hours. No results for input(s): AMMONIA in the last 168 hours. CBC:  Recent Labs Lab 02/09/16 1245  WBC 8.8  NEUTROABS 6.6  HGB 13.6  HCT 37.6  MCV 88.7  PLT 300   Cardiac Enzymes: No results for input(s): CKTOTAL, CKMB, CKMBINDEX, TROPONINI in the last 168 hours.  BNP (last 3 results) No results for input(s): BNP in the last 8760 hours.  ProBNP (last 3 results) No  results for input(s): PROBNP in the last 8760 hours.  CBG: No results for input(s): GLUCAP in the last 168 hours.  Radiological Exams on Admission: Dg Pelvis 1-2 Views  Result Date: 02/09/2016 CLINICAL DATA:  Pain following fall EXAM: PELVIS - 1-2 VIEW COMPARISON:  None. FINDINGS: There is a focal lucency in the subcapital femoral neck region, concerning for potential fracture in this area. No other evidence of fracture. No dislocation. There is slight narrowing of each hip joint, symmetric. IMPRESSION: Concern for subcapital femoral neck fracture on the right. Advise dedicated right hip images to further evaluate. Mild symmetric narrowing both hip joints. No dislocation. Electronically Signed   By: Lowella Grip III M.D.   On: 02/09/2016 11:19   Dg Chest Port 1 View  Result Date: 02/09/2016 CLINICAL DATA:  Pre op; SOB due to pain in right hip' hx asthma, COPD, a fib, HTN; smoker EXAM: PORTABLE CHEST 1 VIEW COMPARISON:  11/06/2015 FINDINGS: The cardiac silhouette is normal in size. No mediastinal or hilar masses. No evidence of adenopathy. Lungs are hyperexpanded but clear. No pleural effusion or pneumothorax. Skeletal structures are demineralized but grossly intact. IMPRESSION: No active disease. Electronically Signed   By: Lajean Manes M.D.   On: 02/09/2016 12:45   Dg Hip Unilat W Or Wo Pelvis 2-3 Views Right  Result Date: 02/09/2016 CLINICAL DATA:  Fall down steps at home last night complains of bilat hip pain, worse on right, pelvis xray done earlier EXAM: DG HIP (WITH OR WITHOUT PELVIS) 2-3V RIGHT COMPARISON:  None. FINDINGS: There is a subcapital fracture of the right femoral neck. Fracture is nondisplaced. There is valgus angulation. No other fractures. Hip joint is normally spaced and aligned. Bones are demineralized. Soft tissue edema is seen lateral to the right proximal femur/ hip. IMPRESSION: 1. Nondisplaced, subcapital fracture of the right femoral neck with valgus angulation.  Electronically Signed   By: Lajean Manes M.D.   On: 02/09/2016 12:13    EKG: Independently reviewed. NSR, PR 0.08, QRS axis 90, no st-t changes Overall wnl  Assessment/Plan  r HIp #  high functioning lady ASA class3-4 Gupa peri-op Card MOrtality 0.65% Cleared for surgeyr- EKg and CXr benign 1.  per discretion Ortho services-appreciate their input into   Anticoagulation post-op  Weight bearing tolerance for therapy services  Wound care  Pain management  Follow-up services required  Afib not on consistent AC Will need cousnelling regarding this.  HOld AC for now till surgeyr May take Cardizem 300 if not taken this am Please discuss with he rNOAC vs Coumadin post-op  Stable stg 2-3 COPd Cont inhaler albuterol 2 pff q 4 prn, Dulera 2 puff bid  ?recen tTIA Monitor  Htn Continue Lossartan-HCTZ 1 tab qhs  Bowel Start SOrbitol  DNR confirmed bedisde Inpatient Expect  d/c 2-4 days  Nita Sells [facility] Pager [phone]  If 7PM-7AM, please contact night-coverage [email or web address] Password [email or web address] 02/09/2016, 1:52 PM

## 2016-02-09 NOTE — Op Note (Signed)
OPERATIVE REPORT    DATE OF PROCEDURE:  02/09/2016       PREOPERATIVE DIAGNOSIS:  Displaced right femoral neck fracture                                                          POSTOPERATIVE DIAGNOSIS:  Displaced right femoral neck fracture                                                           PROCEDURE: Anterior R total hip arthroplasty using a 50 mm DePuy Pinnacle  Cup, [employer], 0-degree polyethylene liner, a +1 32 mm ceramic head, a 5 Depuy Triloc stem   SURGEON: Rubee Vega J    ASSISTANT:   Eric K. Barton Dubois  (present throughout entire procedure and necessary for timely completion of the procedure)   ANESTHESIA: Spinal BLOOD LOSS: 300 FLUID REPLACEMENT: 1500 crystalloid Antibiotic: 2gm ancef Tranexamic Acid: 2gm topica COMPLICATIONS: none    INDICATIONS FOR PROCEDURE: A 70 y.o. year-old With  Displaced right femoral neck fracture  AM today. 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 Plan R total hip arthroplasty to decrease pain and increase function. The risks, benefits, and alternatives were discussed at length including but not limited to the risks of infection, bleeding, nerve injury, stiffness, blood clots, the need for revision surgery, cardiopulmonary complications, among others, and they were willing to proceed. Questions answered     PROCEDURE IN DETAIL: The patient was identified by armband,  received preoperative IV antibiotics in the holding area at [facility], taken to the operating room , appropriate anesthetic monitors  were attached and  anesthesia was induced with the patienton the gurney. The HANA boots were applied to the feet and he was then transferred to the HANA table with a peroneal post and support underneath the non-operative le, which was locked in 5 lb traction. Theoperative lower extremity was then prepped and draped in the usual sterile fashion from just above the iliac crest to the knee. And a timeout procedure  was performed. We then made a 12 cm incision along the interval at the leading edge of the tensor fascia lata of starting at 2 cm lateral to and 2 cm distal to the ASIS. Small bleeders in the skin and subcutaneous tissue identified and cauterized we dissected down to the fascia and made an incision in the fascia allowing [place] to elevate the fascia of the tensor muscle and exploited the interval between the rectus and the tensor fascia lata. A Hohmann retractor was then placed along the superior neck of the femur and a Cobra retractor along the inferior neck of the femur we teed the capsule starting out at the superior anterior aspect of the acetabulum going distally and made the T along the neck both leaflets of the T were tagged with #2 Ethibond suture. We idwntified the sub capital neck Fx.Cobra retractors were then placed along the inferior and superior neck allowing [place] to perform a standard neck cut and removed the femoral head with a power corkscrew. We then placed a right angle Hohmann retractor along the  anterior aspect of the acetabulum a spiked Cobra in the cotyloid notch and posteriorly a Muelller retractor. We then sequentially reamed up to a 49 mm basket reamer obtaining good coverage in all quadrants, verified by C-arm imaging. Under C-arm control with and hammered into place a 50 mm Pinnacle cup in 45 of abduction and 15 of anteversion. The cup seated nicely and required no supplemental screws. We then placed a central hole Eliminator and a 0 polyethylene liner. The foot was then externally rotated to 110, the HANA elevator was placed around the flare of the greater trochanter and the limb was extended and abducted delivering the proximal femur up into the wound. A medium Hohmann retractor was placed over the greater trochanter and a Mueller retractor along the posterior femoral neck completing the exposure. We then performed releases superiorly and and inferiorly of the capsule going back to the  pirformis fossa superiorly and to the lesser trochanter inferiorly. We then entered the proximal femur with the box cutting offset chisel followed by, a canal sounder, the chili pepper and broaching up to a 5 broach. This seated nicely and we reamed the calcar. A trial reduction was performed with a 1 32 mm head.The limb lengths were excellent the hip was stable in 90 of external rotation. At this point the trial components removed and we hammered into place a # 5 Tri-Lock stem with Gryption coating. This was a std offset stem and a + 1 32 mm ceramic ball was then hammered into place the hip was reduced and final C-arm images obtained. The wound was thoroughly irrigated with normal saline solution. We repaired the ant capsule and the tensor fascia lot a with running 0 vicryl suture. the subcutaneous tissue was closed with 2-0 and 3-0 Vicryl suture followed by an Aquacil dressing. At this point the patient was awaken and transferred to hospital gurney without difficulty. The subcutaneous tissue with 0 and 2-0 undyed Vicryl suture and the skin with running  3-0 vicryl subcuticular suture. Aquacil dressing was applied. The patient was then unclamped, rolled supine, awaken extubated and taken to recovery room without difficulty in stable condition.   Zephyra Bernardi J 02/09/2016, 4:20 PM

## 2016-02-09 NOTE — ED Provider Notes (Signed)
[facility] DEPT Provider Note   CSN: [id number] Arrival date & time: 02/09/16  0935     History   Chief Complaint Chief Complaint  Patient presents with  . Groin Pain    HPI Kim Blankenship is a 70 y.o. female. She presents for evaluation of right hip pain. She was walking on some stairs last night. She slipped on the second to last step. When she hit the bottom level she "did the splits". She states both legs went out to the side. She has pain in her right hip and cannot bear weight since that time. A friend drove her to the hospital a she states she had to crawl out of the house and had help to the car  HPI  Past Medical History:  Diagnosis Date  . Asthma   . Atrial fibrillation ([place])   . COPD (chronic obstructive pulmonary disease) ([place])   . Hypertension   . Irregular heart rate     Patient Active Problem List   Diagnosis Date Noted  . Hip fracture ([place]) 02/09/2016  . Chest pain 11/07/2015  . COPD (chronic obstructive pulmonary disease) ([place]) 08/19/2013  . COPD exacerbation ([place]) 08/19/2013  . Abnormal EKG 08/19/2013  . Fever, unspecified 08/19/2013    Past Surgical History:  Procedure Laterality Date  . CESAREAN SECTION    . Left Foot Reconstruction    . OPEN REDUCTION INTERNAL FIXATION (ORIF) METACARPAL Right 03/10/2015   Procedure: OPEN REDUCTION INTERNAL FIXATION (ORIF) RIGHT LONG  METACARPAL FRACTURE;  Surgeon: Leanora Cover, MD;  Location: [facility];  Service: Orthopedics;  Laterality: Right;  . TONSILLECTOMY      OB History    No data available       Home Medications    Prior to Admission medications   Medication Sig Start Date End Date Taking? Authorizing Provider  albuterol (PROVENTIL HFA;VENTOLIN HFA) 108 (90 BASE) MCG/ACT inhaler Inhale 2 puffs into the lungs every 4 (four) hours as needed for wheezing or shortness of breath.   Yes Historical Provider, MD  budesonide-formoterol (SYMBICORT) 160-4.5 MCG/ACT inhaler Inhale 2 puffs  into the lungs 2 (two) times daily.   Yes Historical Provider, MD  diltiazem (TIAZAC) 300 MG 24 hr capsule Take 300 mg by mouth daily. 01/09/16  Yes Historical Provider, MD  ibuprofen (ADVIL,MOTRIN) 200 MG tablet Take 800 mg by mouth every 6 (six) hours as needed for fever, headache, mild pain, moderate pain or cramping.   Yes Historical Provider, MD  losartan-hydrochlorothiazide (HYZAAR) 100-25 MG tablet Take 1 tablet by mouth at bedtime.  01/09/16  Yes Historical Provider, MD  meclizine (ANTIVERT) 25 MG tablet Take 25 mg by mouth daily as needed (for vertigo).  12/12/15  Yes Historical Provider, MD  Multiple Vitamin (MULTIVITAMIN) tablet Take 1 tablet by mouth daily with breakfast.    Yes Historical Provider, MD  pramipexole (MIRAPEX) 0.25 MG tablet Take 0.25 mg by mouth at bedtime as needed (for restless legs).    Yes Historical Provider, MD    Family History History reviewed. No pertinent family history.  Social History Social History  Substance Use Topics  . Smoking status: Former Smoker    Types: Cigarettes  . Smokeless tobacco: Never Used  . Alcohol use Yes     Comment: wine daily     Allergies   Patient has no known allergies.   Review of Systems Review of Systems  Constitutional: Negative for appetite change, chills, diaphoresis, fatigue and fever.  HENT: Negative for mouth sores,  sore throat and trouble swallowing.   Eyes: Negative for visual disturbance.  Respiratory: Negative for cough, chest tightness, shortness of breath and wheezing.   Cardiovascular: Negative for chest pain.  Gastrointestinal: Negative for abdominal distention, abdominal pain, diarrhea, nausea and vomiting.  Endocrine: Negative for polydipsia, polyphagia and polyuria.  Genitourinary: Negative for dysuria, frequency and hematuria.  Musculoskeletal: Negative for gait problem.       Hip pain  Skin: Negative for color change, pallor and rash.  Neurological: Negative for dizziness, syncope,  light-headedness and headaches.  Hematological: Does not bruise/bleed easily.  Psychiatric/Behavioral: Negative for behavioral problems and confusion.     Physical Exam Updated Vital Signs BP 128/64   Pulse 69   Temp 97.5 F (36.4 C) (Oral)   Resp 18   SpO2 92%   Physical Exam  Constitutional: She is oriented to person, place, and time. She appears well-developed and well-nourished. No distress.  HENT:  Head: Normocephalic.  Eyes: Conjunctivae are normal. Pupils are equal, round, and reactive to light. No scleral icterus.  Neck: Normal range of motion. Neck supple. No thyromegaly present.  Cardiovascular: Normal rate and regular rhythm.  Exam reveals no gallop and no friction rub.   No murmur heard. Sinus rhythm on the monitor  Pulmonary/Chest: Effort normal and breath sounds normal. No respiratory distress. She has no wheezes. She has no rales.  Abdominal: Soft. Bowel sounds are normal. She exhibits no distension. There is no tenderness. There is no rebound.  Musculoskeletal: Normal range of motion.  Painful with range of motion of the right hip. Some tenderness anteriorly over the humeral region. Not directly over the pubic symphysis. No ecchymosis. No shortening or rotation.  Neurological: She is alert and oriented to person, place, and time.  Skin: Skin is warm and dry. No rash noted.  Psychiatric: She has a normal mood and affect. Her behavior is normal.     ED Treatments / Results  Labs (all labs ordered are listed, but only abnormal results are displayed) Labs Reviewed  CBC WITH DIFFERENTIAL/PLATELET - Abnormal; Notable for the following:       Result Value   MCHC 36.2 (*)    All other components within normal limits  COMPREHENSIVE METABOLIC PANEL - Abnormal; Notable for the following:    Sodium 134 (*)    Potassium 3.3 (*)    Chloride 99 (*)    Glucose, Bld 104 (*)    All other components within normal limits  URINALYSIS, ROUTINE W REFLEX MICROSCOPIC (NOT AT  [facility]) - Abnormal; Notable for the following:    Leukocytes, UA TRACE (*)    All other components within normal limits  URINE MICROSCOPIC-ADD ON - Abnormal; Notable for the following:    Squamous Epithelial / LPF 0-5 (*)    Bacteria, UA RARE (*)    All other components within normal limits  URINE CULTURE  PROTIME-INR    EKG  EKG Interpretation  Date/Time:  Friday February 09 2016 13:01:12 EST Ventricular Rate:  77 PR Interval:    QRS Duration: 101 QT Interval:  414 QTC Calculation: 469 R Axis:   86 Text Interpretation:  Sinus rhythm Borderline right axis deviation Confirmed by Jeneen Rinks  MD, [place] ([id number]) on 02/09/2016 1:45:13 PM       Radiology Dg Pelvis 1-2 Views  Result Date: 02/09/2016 CLINICAL DATA:  Pain following fall EXAM: PELVIS - 1-2 VIEW COMPARISON:  None. FINDINGS: There is a focal lucency in the subcapital femoral neck region, concerning for potential fracture  in this area. No other evidence of fracture. No dislocation. There is slight narrowing of each hip joint, symmetric. IMPRESSION: Concern for subcapital femoral neck fracture on the right. Advise dedicated right hip images to further evaluate. Mild symmetric narrowing both hip joints. No dislocation. Electronically Signed   By: Lowella Grip III M.D.   On: 02/09/2016 11:19   Dg Chest Port 1 View  Result Date: 02/09/2016 CLINICAL DATA:  Pre op; SOB due to pain in right hip' hx asthma, COPD, a fib, HTN; smoker EXAM: PORTABLE CHEST 1 VIEW COMPARISON:  11/06/2015 FINDINGS: The cardiac silhouette is normal in size. No mediastinal or hilar masses. No evidence of adenopathy. Lungs are hyperexpanded but clear. No pleural effusion or pneumothorax. Skeletal structures are demineralized but grossly intact. IMPRESSION: No active disease. Electronically Signed   By: Lajean Manes M.D.   On: 02/09/2016 12:45   Dg Hip Unilat W Or Wo Pelvis 2-3 Views Right  Result Date: 02/09/2016 CLINICAL DATA:  Fall down steps at home last  night complains of bilat hip pain, worse on right, pelvis xray done earlier EXAM: DG HIP (WITH OR WITHOUT PELVIS) 2-3V RIGHT COMPARISON:  None. FINDINGS: There is a subcapital fracture of the right femoral neck. Fracture is nondisplaced. There is valgus angulation. No other fractures. Hip joint is normally spaced and aligned. Bones are demineralized. Soft tissue edema is seen lateral to the right proximal femur/ hip. IMPRESSION: 1. Nondisplaced, subcapital fracture of the right femoral neck with valgus angulation. Electronically Signed   By: Lajean Manes M.D.   On: 02/09/2016 12:13    Procedures Procedures (including critical care time)  Medications Ordered in ED Medications  0.9 %  sodium chloride infusion (not administered)  HYDROcodone-acetaminophen (NORCO/VICODIN) 5-325 MG per tablet 1 tablet (1 tablet Oral Given 02/09/16 1059)     Initial Impression / Assessment and Plan / ED Course  I have reviewed the triage vital signs and the nursing notes.  Pertinent labs & imaging results that were available during my care of the patient were reviewed by me and considered in my medical decision making (see chart for details).  Clinical Course     Patient with history of atrial fibrillation. Currently in sinus rhythm. Not anticoagulated by choice. History of reactive airways disease. Chronic lungs and normal chest x-ray. Initial pelvic films show possibility of subcapital hip fracture. Formal hip x-rays do show again subtle/nondisplaced subtle fracture. Discussed with Dr. Elesa Massed. He is here evaluate and the patient. Initially over time was given is 1800. Apparently patient been called for immediate placement in preop holding for planned repair within the hour. We'll discussed with hospitalist regarding admission.  Final Clinical Impressions(s) / ED Diagnoses   Final diagnoses:  Closed fracture of right hip, initial encounter [facility])    New Prescriptions New Prescriptions   No medications on  file     Tanna Furry, MD 02/09/16 1407

## 2016-02-09 NOTE — ED Triage Notes (Signed)
Pt c/o right groin pain onset last night after slipping and falling down 3 stair steps, landing "spread eagle" onto hardwood floor. No head injury, no LOC. No anticoagulant.

## 2016-02-09 NOTE — Anesthesia Procedure Notes (Signed)
Procedure Name: MAC Date/Time: 02/09/2016 2:50 PM Performed by: West Pugh Pre-anesthesia Checklist: Patient identified, Timeout performed, Emergency Drugs available, Suction available and Patient being monitored Patient Re-evaluated:Patient Re-evaluated prior to inductionOxygen Delivery Method: Simple face mask Placement Confirmation: positive ETCO2 Dental Injury: Teeth and Oropharynx as per pre-operative assessment

## 2016-02-09 NOTE — Discharge Instructions (Signed)

## 2016-02-09 NOTE — Anesthesia Postprocedure Evaluation (Signed)
Anesthesia Post Note  Patient: Kim Blankenship  Procedure(s) Performed: Procedure(s) (LRB): TOTAL HIP ARTHROPLASTY ANTERIOR APPROACH (Right)  Patient location during evaluation: PACU Anesthesia Type: Spinal and MAC Level of consciousness: awake and alert, oriented and patient cooperative Pain management: pain level controlled Vital Signs Assessment: post-procedure vital signs reviewed and stable Respiratory status: spontaneous breathing and respiratory function stable Cardiovascular status: blood pressure returned to baseline and stable Postop Assessment: spinal receding and no signs of nausea or vomiting Anesthetic complications: no    Last Vitals:  Vitals:   02/09/16 1745 02/09/16 1800  BP: 122/67 (!) 143/64  Pulse: 67 68  Resp: 15 12  Temp:      Last Pain:  Vitals:   02/09/16 1700  TempSrc:   PainSc: Asleep                 Jax Abdelrahman,E. Rhyland Hinderliter

## 2016-02-09 NOTE — ED Notes (Signed)
Dr Samtani at bedside 

## 2016-02-09 NOTE — Anesthesia Procedure Notes (Addendum)
Spinal  Patient location during procedure: OR Start time: 02/09/2016 2:50 PM End time: 02/09/2016 2:58 PM Reason for block: at surgeon's request Staffing Anesthesiologist: Annye Asa Resident/CRNA: Christell Faith L Performed: resident/CRNA  Preanesthetic Checklist Completed: patient identified, site marked, surgical consent, pre-op evaluation, timeout performed, IV checked, risks and benefits discussed and monitors and equipment checked Spinal Block Patient position: sitting Prep: Betadine Patient monitoring: heart rate, continuous pulse ox and blood pressure Approach: right paramedian Location: L3-4 Injection technique: single-shot Needle Needle type: Pencan  Needle gauge: 25 G Needle length: 9 cm Assessment Sensory level: T6 Additional Notes Expiration of kit checked and confirmed. Patient tolerated procedure well,without complications x 1 attempt with noted clear CSF. Loss of motor and sensory on exam post injection.

## 2016-02-09 NOTE — Anesthesia Preprocedure Evaluation (Signed)
Anesthesia Evaluation  Patient identified by MRN, date of birth, ID band Patient awake    Reviewed: Allergy & Precautions, NPO status , Patient's Chart, lab work & pertinent test results  History of Anesthesia Complications Negative for: history of anesthetic complications  Airway Mallampati: III  TM Distance: >3 FB Neck ROM: Full    Dental  (+) Dental Advisory Given, Missing   Pulmonary COPD,  COPD inhaler, former smoker,    breath sounds clear to auscultation       Cardiovascular hypertension, Pt. on medications (-) angina+ dysrhythmias Atrial Fibrillation  Rhythm:Regular Rate:Normal  10/17 ECHO: EF 60-65%, valves OK   Neuro/Psych negative neurological ROS     GI/Hepatic negative GI ROS, Neg liver ROS,   Endo/Other  negative endocrine ROS  Renal/GU negative Renal ROS     Musculoskeletal   Abdominal   Peds  Hematology negative hematology ROS (+) INR 0.88   Anesthesia Other Findings   Reproductive/Obstetrics                             Anesthesia Physical Anesthesia Plan  ASA: III  Anesthesia Plan: Spinal   Post-op Pain Management:    Induction:   Airway Management Planned: Natural Airway and Simple Face Mask  Additional Equipment:   Intra-op Plan:   Post-operative Plan:   Informed Consent: I have reviewed the patients History and Physical, chart, labs and discussed the procedure including the risks, benefits and alternatives for the proposed anesthesia with the patient or authorized representative who has indicated his/her understanding and acceptance.   Dental advisory given  Plan Discussed with: CRNA and Surgeon  Anesthesia Plan Comments: (Plan routine monitors, SAB)        Anesthesia Quick Evaluation

## 2016-02-09 NOTE — Transfer of Care (Signed)
Immediate Anesthesia Transfer of Care Note  Patient: Kim Blankenship  Procedure(s) Performed: Procedure(s): TOTAL HIP ARTHROPLASTY ANTERIOR APPROACH (Right)  Patient Location: PACU  Anesthesia Type:MAC and Spinal  Level of Consciousness:  sedated, patient cooperative and responds to stimulation  Airway & Oxygen Therapy:Patient Spontanous Breathing and Patient connected to face mask oxgen  Post-op Assessment:  Report given to PACU RN and Post -op Vital signs reviewed and stable  Post vital signs:  Reviewed and stable  Last Vitals:  Vitals:   02/09/16 1223 02/09/16 1330  BP: 128/64 143/64  Pulse: 69 78  Resp: 18 11  Temp:      Complications: No apparent anesthesia complications

## 2016-02-10 DIAGNOSIS — D62 Acute posthemorrhagic anemia: Secondary | ICD-10-CM

## 2016-02-10 DIAGNOSIS — S72001A Fracture of unspecified part of neck of right femur, initial encounter for closed fracture: Principal | ICD-10-CM

## 2016-02-10 LAB — BASIC METABOLIC PANEL
Anion gap: 6 (ref 5–15)
BUN: 10 mg/dL (ref 6–20)
CO2: 28 mmol/L (ref 22–32)
Calcium: 8.5 mg/dL — ABNORMAL LOW (ref 8.9–10.3)
Chloride: 102 mmol/L (ref 101–111)
Creatinine, Ser: 0.69 mg/dL (ref 0.44–1.00)
GFR calc Af Amer: >60 mL/min (ref >60)
GFR calc non Af Amer: >60 mL/min (ref >60)
Glucose, Bld: 242 mg/dL — ABNORMAL HIGH (ref 65–99)
Potassium: 3.6 mmol/L (ref 3.5–5.1)
Sodium: 136 mmol/L (ref 135–145)

## 2016-02-10 LAB — CBC
HCT: 31.7 % — ABNORMAL LOW (ref 36.0–46.0)
Hemoglobin: 11.4 g/dL — ABNORMAL LOW (ref 12.0–15.0)
MCH: 32.2 pg (ref 26.0–34.0)
MCHC: 36 g/dL (ref 30.0–36.0)
MCV: 89.5 fL (ref 78.0–100.0)
Platelets: 261 10*3/uL (ref 150–400)
RBC: 3.54 MIL/uL — ABNORMAL LOW (ref 3.87–5.11)
RDW: 12.6 % (ref 11.5–15.5)
WBC: 12.3 10*3/uL — ABNORMAL HIGH (ref 4.0–10.5)

## 2016-02-10 LAB — URINE CULTURE

## 2016-02-10 LAB — SURGICAL PCR SCREEN
MRSA, PCR: NEGATIVE
Staphylococcus aureus: NEGATIVE
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
## 2016-02-10 NOTE — Progress Notes (Signed)
PROGRESS NOTE    Kim Blankenship  [id number] DOB: 09-24-1945 DOA: 02/09/2016 PCP: Abigail Miyamoto, MD   Brief Narrative: 70 year old female with history of hypertension, COPD, A. fib not on anticoagulation, presented after a fall sustaining displaced right femoral neck fracture. Status post orthopedic surgery.  Assessment & Plan:   #Fall sustaining displaced right femoral neck fracture: Status post total hip arthroplasty with anterior approach on right side by orthopedics. -Continue supportive care including PT, OT evaluation and treatment. -Aspirin 325 mg twice a day for 2 weeks for DVT prophylaxis as per orthopedics. Continue SCD for 72 hours.  #Likely acute blood loss anemia in the setting of surgery: Drop in hemoglobin today. No sign of bleeding. I would repeat CBC tomorrow morning.  #COPD: Stable.  #Atrial fibrillation likely paroxysmal: I advised patient to follow-up with her PCP or cardiologist regarding further management and possible discussion regarding anticoagulation therapy. Not starting now because of surgery and follow.  DVT prophylaxis: Lovenox. Code Status: DO NOT RESUSCITATE Family Communication: No family present Disposition Plan: Likely discharge home with home care tomorrow.   Consultants:   Orthopedics  Procedures: Hip surgery Antimicrobials: None  Subjective: Patient was seen and examined at bedside. Patient was sitting on chair comfortably. Denies any pain. No fever, chills, headache, dizziness, chest pain or shortness of breath. Patient feels better. Working with PT.   Objective: Vitals:   02/09/16 2229 02/10/16 0217 02/10/16 0702 02/10/16 0900  BP: (!) 147/63 (!) 116/55 126/78 103/79  Pulse: 74 64 70 68  Resp: 16 16 16 16   Temp: 98.4 F (36.9 C) 98.7 F (37.1 C) 97.6 F (36.4 C) 97.3 F (36.3 C)  TempSrc: Oral Oral Oral Oral  SpO2: 96% 99% 99% 98%  Weight:      Height:        Intake/Output Summary (Last 24 hours) at 02/10/16  1341 Last data filed at 02/10/16 1000  Gross per 24 hour  Intake          4291.91 ml  Output             2575 ml  Net          1716.91 ml   Filed Weights   02/09/16 2100  Weight: 63.5 kg (140 lb)    Examination:  General exam: Appears calm and comfortable  Respiratory system: Clear to auscultation. Respiratory effort normal. No wheezing or crackle Cardiovascular system: S1 & S2 heard, RRR.  No pedal edema. Gastrointestinal system: Abdomen is nondistended, soft and nontender. Normal bowel sounds heard. Central nervous system: Alert and oriented. No focal neurological deficits. Extremities: Symmetric 5 x 5 power. Skin: No rashes, lesions or ulcers Psychiatry: Judgement and insight appear normal. Mood & affect appropriate.     Data Reviewed: I have personally reviewed following labs and imaging studies  CBC:  Recent Labs Lab 02/09/16 1245 02/10/16 0429  WBC 8.8 12.3*  NEUTROABS 6.6  --   HGB 13.6 11.4*  HCT 37.6 31.7*  MCV 88.7 89.5  PLT 300 0000000   Basic Metabolic Panel:  Recent Labs Lab 02/09/16 1245 02/10/16 0429  NA 134* 136  K 3.3* 3.6  CL 99* 102  CO2 27 28  GLUCOSE 104* 242*  BUN 12 10  CREATININE 0.82 0.69  CALCIUM 9.1 8.5*   GFR: Estimated Creatinine Clearance: 60.6 mL/min (by C-G formula based on SCr of 0.69 mg/dL). Liver Function Tests:  Recent Labs Lab 02/09/16 1245  AST 30  ALT 28  ALKPHOS 68  BILITOT  1.0  PROT 7.1  ALBUMIN 4.3   No results for input(s): LIPASE, AMYLASE in the last 168 hours. No results for input(s): AMMONIA in the last 168 hours. Coagulation Profile:  Recent Labs Lab 02/09/16 1245  INR 0.88   Cardiac Enzymes: No results for input(s): CKTOTAL, CKMB, CKMBINDEX, TROPONINI in the last 168 hours. BNP (last 3 results) No results for input(s): PROBNP in the last 8760 hours. HbA1C: No results for input(s): HGBA1C in the last 72 hours. CBG: No results for input(s): GLUCAP in the last 168 hours. Lipid Profile: No  results for input(s): CHOL, HDL, LDLCALC, TRIG, CHOLHDL, LDLDIRECT in the last 72 hours. Thyroid Function Tests: No results for input(s): TSH, T4TOTAL, FREET4, T3FREE, THYROIDAB in the last 72 hours. Anemia Panel: No results for input(s): VITAMINB12, FOLATE, FERRITIN, TIBC, IRON, RETICCTPCT in the last 72 hours. Sepsis Labs: No results for input(s): PROCALCITON, LATICACIDVEN in the last 168 hours.  Recent Results (from the past 240 hour(s))  Surgical PCR screen     Status: None   Collection Time: 02/10/16  6:10 AM  Result Value Ref Range Status   MRSA, PCR NEGATIVE NEGATIVE Final   Staphylococcus aureus NEGATIVE NEGATIVE Final    Comment:        The Xpert SA Assay (FDA approved for NASAL specimens in patients over 36 years of age), is one component of a comprehensive surveillance program.  Test performance has been validated by [facility] for patients greater than or equal to 39 year old. It is not intended to diagnose infection nor to guide or monitor treatment.          Radiology Studies: Dg Pelvis 1-2 Views  Result Date: 02/09/2016 CLINICAL DATA:  Pain following fall EXAM: PELVIS - 1-2 VIEW COMPARISON:  None. FINDINGS: There is a focal lucency in the subcapital femoral neck region, concerning for potential fracture in this area. No other evidence of fracture. No dislocation. There is slight narrowing of each hip joint, symmetric. IMPRESSION: Concern for subcapital femoral neck fracture on the right. Advise dedicated right hip images to further evaluate. Mild symmetric narrowing both hip joints. No dislocation. Electronically Signed   By: Lowella Grip III M.D.   On: 02/09/2016 11:19   Dg Chest Port 1 View  Result Date: 02/09/2016 CLINICAL DATA:  Pre op; SOB due to pain in right hip' hx asthma, COPD, a fib, HTN; smoker EXAM: PORTABLE CHEST 1 VIEW COMPARISON:  11/06/2015 FINDINGS: The cardiac silhouette is normal in size. No mediastinal or hilar masses. No evidence of  adenopathy. Lungs are hyperexpanded but clear. No pleural effusion or pneumothorax. Skeletal structures are demineralized but grossly intact. IMPRESSION: No active disease. Electronically Signed   By: Lajean Manes M.D.   On: 02/09/2016 12:45   Dg C-arm 61-120 Min-no Report  Result Date: 02/09/2016 CLINICAL DATA:  Postop EXAM: DG C-ARM 61-120 MIN-NO REPORT; OPERATIVE RIGHT HIP WITH PELVIS COMPARISON:  02/09/2016 FLUOROSCOPY TIME:  Fluoroscopy Time:  15 seconds Radiation Exposure Index (if provided by the fluoroscopic device): 0.58 mGy Number of Acquired Spot Images: 2 FINDINGS: The patient is status post intraoperative repair of right femoral neck fracture. There is right hip prosthesis with anatomic alignment. IMPRESSION: Right hip prosthesis with anatomic alignment. Please see the operative report. Electronically Signed   By: Lahoma Crocker M.D.   On: 02/09/2016 16:28   Dg Hip Operative Unilat With Pelvis Right  Result Date: 02/09/2016 CLINICAL DATA:  Postop EXAM: DG C-ARM 61-120 MIN-NO REPORT; OPERATIVE RIGHT HIP WITH  PELVIS COMPARISON:  02/09/2016 FLUOROSCOPY TIME:  Fluoroscopy Time:  15 seconds Radiation Exposure Index (if provided by the fluoroscopic device): 0.58 mGy Number of Acquired Spot Images: 2 FINDINGS: The patient is status post intraoperative repair of right femoral neck fracture. There is right hip prosthesis with anatomic alignment. IMPRESSION: Right hip prosthesis with anatomic alignment. Please see the operative report. Electronically Signed   By: Lahoma Crocker M.D.   On: 02/09/2016 16:28   Dg Hip Unilat W Or Wo Pelvis 2-3 Views Right  Result Date: 02/09/2016 CLINICAL DATA:  Fall down steps at home last night complains of bilat hip pain, worse on right, pelvis xray done earlier EXAM: DG HIP (WITH OR WITHOUT PELVIS) 2-3V RIGHT COMPARISON:  None. FINDINGS: There is a subcapital fracture of the right femoral neck. Fracture is nondisplaced. There is valgus angulation. No other fractures. Hip  joint is normally spaced and aligned. Bones are demineralized. Soft tissue edema is seen lateral to the right proximal femur/ hip. IMPRESSION: 1. Nondisplaced, subcapital fracture of the right femoral neck with valgus angulation. Electronically Signed   By: Lajean Manes M.D.   On: 02/09/2016 12:13        Scheduled Meds: . aspirin EC  325 mg Oral Q breakfast  . celecoxib  200 mg Oral Q12H  . diltiazem  300 mg Oral Daily  . docusate sodium  100 mg Oral BID  . enoxaparin (LOVENOX) injection  30 mg Subcutaneous Q24H  . gabapentin  300 mg Oral TID  . losartan  100 mg Oral QHS   And  . hydrochlorothiazide  25 mg Oral QHS  . mometasone-formoterol  2 puff Inhalation BID   Continuous Infusions: . dextrose 5 % and 0.45 % NaCl with KCl 20 mEq/L 30 mL/hr at 02/10/16 1000     LOS: 1 day    Time spent: 25 minutes.    Dron Tanna Furry, MD [facility] Pager [phone]  If 7PM-7AM, please contact night-coverage [email or web address] Password [email or web address] 02/10/2016, 1:41 PM

## 2016-02-10 NOTE — Progress Notes (Signed)
It was noted on repeat review of the patient's orders that there was no order for post-op antibiotics.  A call was placed to the answering service for [facility] about 0630.  At [place], Justine Null, PA-C returned the call.  I explained my request for antibiotics and that no order had been placed and reviewed the SCIP protocol. He stated that he would be meeting with Dr. Mayer Camel this morning and that he would review this with him.  No new orders were given at this time.  This information was also included as part of reporting off to the day shift RN.

## 2016-02-10 NOTE — Progress Notes (Signed)
Physical Therapy Treatment Patient Details Name: Kim Blankenship MRN: [id number] DOB: 10-07-1945 Today's Date: 02/10/2016    History of Present Illness S/P THA via anterior approach after sustaining a fall/sub capital hip fracture on11/10/17.    PT Comments    The  Patient is progressing well. Cautioned patient to not walk away from the RW which she did when returned  To the room. Continue PT while in acute care.  Follow Up Recommendations  Home health PT;Supervision/Assistance - 24 hour     Equipment Recommendations  Rolling walker with 5" wheels    Recommendations for Other Services       Precautions / Restrictions Precautions Precautions: Fall Precaution Comments: antero-lateral precautions, no abduction, no external rotation ,no hyperextension, patient stated the precautions    Mobility  Bed Mobility               General bed mobility comments: in chair.   Transfers Overall transfer level: Needs assistance Equipment used: Rolling walker (2 wheeled) Transfers: Sit to/from Stand Sit to Stand: Supervision         General transfer comment: cues for hand placement. Cues for safety due to  Patient parked the RW and took 3 steps to recliner without it.   Ambulation/Gait Ambulation/Gait assistance: Supervision Ambulation Distance (Feet): 120 Feet Assistive device: Rolling walker (2 wheeled) Gait Pattern/deviations: Step-to pattern;Step-through pattern Gait velocity: wfl   General Gait Details: cues for sequence.  No c/o dizziness,    Stairs            Wheelchair Mobility    Modified Rankin (Stroke Patients Only)       Balance                                    Cognition Arousal/Alertness: Awake/alert Behavior During Therapy: WFL for tasks assessed/performed Overall Cognitive Status: Within Functional Limits for tasks assessed                      Exercises      General Comments        Pertinent Vitals/Pain  Pain Assessment: 0-10 Pain Score: 1  Pain Location: R hip/thigh Pain Descriptors / Indicators: Burning;Sore Pain Intervention(s): Ice applied;Premedicated before session    [place] expects to be discharged to:: Private residence Living Arrangements: Children Available Help at Discharge: Family;Friend(s) Type of Home: House Home Access: Stairs to enter Entrance Stairs-Rails: Left Home Layout: Two level;Able to live on main level with bedroom/bathroom Home Equipment: None Additional Comments: no ledge to shower. flat entry.    Prior Function Level of Independence: Independent      Comments: works full time. adult daughter lives at home who has seizures.   PT Goals (current goals can now be found in the care plan section) Acute Rehab PT Goals Patient Stated Goal: to return to work Progress towards PT goals: Progressing toward goals    Frequency    7X/week      PT Plan Current plan remains appropriate    Co-evaluation             End of Session   Activity Tolerance: Patient tolerated treatment well Patient left: in chair;with call bell/phone within reach     Time: 1358-1415 PT Time Calculation (min) (ACUTE ONLY): 17 min  Charges:  $Gait Training: 8-22 mins  G Codes:      Claretha Cooper 02/10/2016, 2:20 PM Tresa Endo PT [phone]

## 2016-02-10 NOTE — Care Management Note (Signed)
Case Management Note  Patient Details  Name: Kim Blankenship MRN: [id number] Date of Birth: 1945/09/17  Subjective/Objective:   right THA                 Action/Plan: Discharge Planning: NCM spoke to pt. Offered choice fo HH/list provided. Pt agreeable to [facility]. Contacted [facility] Liaison with new referral. Pt requesting RW and 3n1 bedside commode for home. Husband at home to assist with care. Contacted [facility] DME rep for equipment to be delivered to room prior to dc.    Expected Discharge Date:  02/11/16               Expected Discharge Plan:  [place]  In-House Referral:  NA  Discharge planning Services  CM Consult  Post Acute Care Choice:  Home Health Choice offered to:  Patient  DME Arranged:  3-N-1, Walker rolling DME Agency:  [place]:  PT, OT [place] Agency:  [facility]  Status of Service:  Completed, signed off  If discussed at [employer], dates discussed:    Additional Comments:  Erenest Rasher, RN 02/10/2016, 2:53 PM

## 2016-02-10 NOTE — Progress Notes (Signed)
PATIENT ID: Kim Blankenship  MRN: [id number]  DOB/AGE:  12-02-1945 / 70 y.o.  1 Day Post-Op Procedure(s) (LRB): TOTAL HIP ARTHROPLASTY ANTERIOR APPROACH (Right)    PROGRESS NOTE Subjective: Patient is alert, oriented, no Nausea, no Vomiting, yes passing gas, . Taking PO well. Denies SOB, Chest or Calf Pain. Using Incentive Spirometer, PAS in place. Ambulate WBAT Patient reports pain as  1/10  .    Objective: Vital signs in last 24 hours: Vitals:   02/09/16 2110 02/09/16 2229 02/10/16 0217 02/10/16 0702  BP:  (!) 147/63 (!) 116/55 126/78  Pulse:  74 64 70  Resp:  16 16 16   Temp:  98.4 F (36.9 C) 98.7 F (37.1 C) 97.6 F (36.4 C)  TempSrc:  Oral Oral Oral  SpO2: 96% 96% 99% 99%  Weight:      Height:          Intake/Output from previous day: I/O last 3 completed shifts: In: 2693.8 [P.O.:360; I.V.:2333.8] Out: 1975 [Urine:1625; Blood:350]   Intake/Output this shift: Total I/O In: 240 [P.O.:240] Out: 600 [Urine:600]   LABORATORY DATA:  Recent Labs  02/09/16 1245 02/10/16 0429  WBC 8.8 12.3*  HGB 13.6 11.4*  HCT 37.6 31.7*  PLT 300 261  NA 134* 136  K 3.3* 3.6  CL 99* 102  CO2 27 28  BUN 12 10  CREATININE 0.82 0.69  GLUCOSE 104* 242*  INR 0.88  --   CALCIUM 9.1 8.5*    Examination: Neurologically intact ABD soft Neurovascular intact Sensation intact distally Intact pulses distally Dorsiflexion/Plantar flexion intact Incision: dressing C/D/I No cellulitis present Compartment soft} XR AP&Lat of hip shows well placed\fixed THA  Assessment:   1 Day Post-Op Procedure(s) (LRB): TOTAL HIP ARTHROPLASTY ANTERIOR APPROACH (Right) ADDITIONAL DIAGNOSIS:  Expected Acute Blood Loss Anemia, COPD  Plan: PT/OT WBAT, THA, no post-op ABX  DVT Prophylaxis: SCDx72 hrs, ASA 325 mg BID x 2 weeks  DISCHARGE PLAN: Home, today or tomorrow if passes PT  DISCHARGE NEEDS: HHPT, Walker and 3-in-1 comode seatPatient ID: Kim Blankenship, female   DOB: 01-31-1946, 70 y.o.    MRN: [id number]

## 2016-02-10 NOTE — Evaluation (Signed)
Physical Therapy Evaluation Patient Details Name: Kim Blankenship MRN: [id number] DOB: February 09, 1946 Today's Date: 02/10/2016   History of Present Illness  S/P THA via direct anterior approach after sustaining a fall/sub capital hip fracture on11/10/17.  Clinical Impression  The patient tolerated ambulation with mild c/o dizziness. BP 103/90. Pt admitted with above diagnosis. Pt currently with functional limitations due to the deficits listed below (see PT Problem List). Pt will benefit from skilled PT to increase their independence and safety with mobility to allow discharge to the venue listed below.       Follow Up Recommendations Home health PT;Supervision/Assistance - 24 hour    Equipment Recommendations  Rolling walker with 5" wheels    Recommendations for Other Services       Precautions / Restrictions Precautions Precautions: Fall Precaution Comments: antero-lateral hip precautions: no abduction, no external rotation ,no hyperextension      Mobility  Bed Mobility Overal bed mobility: Needs Assistance Bed Mobility: Supine to Sit     Supine to sit: Min assist     General bed mobility comments: support right leg  Transfers Overall transfer level: Needs assistance Equipment used: Rolling walker (2 wheeled) Transfers: Sit to/from Stand Sit to Stand: Min guard         General transfer comment: cues for hand and right leg position  Ambulation/Gait Ambulation/Gait assistance: Min assist Ambulation Distance (Feet): 88 Feet Assistive device: Rolling walker (2 wheeled) Gait Pattern/deviations: Step-to pattern;Step-through pattern     General Gait Details: cues for sequence. c/o dizziness, mild . BP 103/90  Stairs            Wheelchair Mobility    Modified Rankin (Stroke Patients Only)       Balance                                             Pertinent Vitals/Pain Pain Assessment: 0-10 Pain Score: 2  Pain Location: R hip Pain  Descriptors / Indicators: Tightness Pain Intervention(s): Premedicated before session;Repositioned;Ice applied    Home Living Family/patient expects to be discharged to:: Private residence Living Arrangements: Children Available Help at Discharge: Family;Friend(s) Type of Home: House Home Access: Stairs to enter Entrance Stairs-Rails: Left   Home Layout: Two level;Able to live on main level with bedroom/bathroom Home Equipment: None      Prior Function Level of Independence: Independent         Comments: works full time. adult daughter lives at home who has seizures.     Hand Dominance        Extremity/Trunk Assessment               Lower Extremity Assessment: RLE deficits/detail RLE Deficits / Details: advances the leg        Communication   Communication: No difficulties  Cognition Arousal/Alertness: Awake/alert Behavior During Therapy: WFL for tasks assessed/performed Overall Cognitive Status: Within Functional Limits for tasks assessed                      General Comments      Exercises Total Joint Exercises Ankle Circles/Pumps: AROM;Both;10 reps;Supine Short Arc Quad: AROM;Right;15 reps;Supine Heel Slides: AROM;Right;15 reps;Supine Long Arc Quad: Right;AROM;10 reps;Seated   Assessment/Plan    PT Assessment Patient needs continued PT services  PT Problem List Decreased strength;Decreased activity tolerance;Decreased mobility;Decreased knowledge of precautions;Decreased safety awareness;Decreased knowledge of use of  DME;Pain          PT Treatment Interventions DME instruction;Gait training;Stair training;Functional mobility training;Therapeutic activities;Therapeutic exercise;Patient/family education    PT Goals (Current goals can be found in the Care Plan section)  Acute Rehab PT Goals Patient Stated Goal: to get back to work PT Goal Formulation: With patient Time For Goal Achievement: 02/14/16 Potential to Achieve Goals: Good     Frequency 7X/week   Barriers to discharge        Co-evaluation               End of Session   Activity Tolerance: Patient tolerated treatment well Patient left: in chair;with call bell/phone within reach Nurse Communication: Mobility status         Time: 0832-0903 PT Time Calculation (min) (ACUTE ONLY): 31 min   Charges:   PT Evaluation $PT Eval Low Complexity: 1 Procedure PT Treatments $Gait Training: 8-22 mins   PT G Codes:        Claretha Cooper 02/10/2016, 9:17 AM Tresa Endo PT [phone]

## 2016-02-10 NOTE — Evaluation (Signed)
Occupational Therapy Evaluation Patient Details Name: Kim Blankenship MRN: [id number] DOB: 1945-05-30 Today's Date: 02/10/2016    History of Present Illness S/P THA via anterior approach after sustaining a fall/sub capital hip fracture on11/10/17.   Clinical Impression   Pt doing well. Educated on anterior precautions and began practice with 3in1 transfers and LB ADL. Will follow to progress safety and independence with self care.    Follow Up Recommendations  No OT follow up;Supervision/Assistance - 24 hour    Equipment Recommendations  3 in 1 bedside comode    Recommendations for Other Services       Precautions / Restrictions        Mobility Bed Mobility               General bed mobility comments: in chair.   Transfers Overall transfer level: Needs assistance Equipment used: Rolling walker (2 wheeled) Transfers: Sit to/from Stand Sit to Stand: Min assist         General transfer comment: cues for hand placement particularly on 3in1.    Balance                                            ADL Overall ADL's : Needs assistance/impaired Eating/Feeding: Independent;Sitting   Grooming: Wash/dry hands;Set up;Sitting   Upper Body Bathing: Set up;Sitting   Lower Body Bathing: Moderate assistance;Sit to/from stand   Upper Body Dressing : Set up;Sitting   Lower Body Dressing: Moderate assistance;Sit to/from stand   Toilet Transfer: Minimal assistance;Ambulation;RW;BSC   Toileting- Clothing Manipulation and Hygiene: Minimal assistance;Sit to/from stand         General ADL Comments: Reviewed anterior precautions with pt and sign in room. Discussed AE options but pt also states her daughter can assist with LB self care. She will consider AE if desired. She has a fully roll in shower so no ledge to negotiate. Discussed use of 3in1 as shower chair. Also discussed sequence for LB dressing and how to perform to adhere to precautions.       Vision     Perception     Praxis      Pertinent Vitals/Pain Pain Assessment: 0-10 Pain Score: 3  Pain Location: R hip Pain Descriptors / Indicators: Aching Pain Intervention(s): Monitored during session;Ice applied     Hand Dominance     Extremity/Trunk Assessment Upper Extremity Assessment Upper Extremity Assessment: Overall WFL for tasks assessed           Communication Communication Communication: No difficulties   Cognition Arousal/Alertness: Awake/alert Behavior During Therapy: WFL for tasks assessed/performed Overall Cognitive Status: Within Functional Limits for tasks assessed                     General Comments       Exercises       Shoulder Instructions      Home Living Family/patient expects to be discharged to:: Private residence Living Arrangements: Children Available Help at Discharge: Family;Friend(s) Type of Home: House Home Access: Stairs to enter   Entrance Stairs-Rails: Left Home Layout: Two level;Able to live on main level with bedroom/bathroom     Bathroom Shower/Tub: Occupational psychologist: Standard     Home Equipment: None   Additional Comments: no ledge to shower. flat entry.      Prior Functioning/Environment Level of Independence: Independent  Comments: works full time. adult daughter lives at home who has seizures.        OT Problem List: Decreased knowledge of use of DME or AE   OT Treatment/Interventions: Self-care/ADL training;Patient/family education;DME and/or AE instruction    OT Goals(Current goals can be found in the care plan section) Acute Rehab OT Goals Patient Stated Goal: to return to work OT Goal Formulation: With patient Time For Goal Achievement: 02/17/16 Potential to Achieve Goals: Good  OT Frequency: Min 2X/week   Barriers to D/C:            Co-evaluation              End of Session Equipment Utilized During Treatment: Rolling walker  Activity  Tolerance: Patient tolerated treatment well Patient left: in chair;with call bell/phone within reach   Time: [id number] OT Time Calculation (min): 29 min Charges:  OT Evaluation $OT Eval Low Complexity: 1 Procedure OT Treatments $Therapeutic Activity: 8-22 mins G-Codes:    Jules Schick 02/10/2016, 1:49 PM

## 2016-02-11 LAB — CBC
HCT: 26.7 % — ABNORMAL LOW (ref 36.0–46.0)
Hemoglobin: 9.7 g/dL — ABNORMAL LOW (ref 12.0–15.0)
MCH: 32 pg (ref 26.0–34.0)
MCHC: 36.3 g/dL — ABNORMAL HIGH (ref 30.0–36.0)
MCV: 88.1 fL (ref 78.0–100.0)
Platelets: 263 10*3/uL (ref 150–400)
RBC: 3.03 MIL/uL — ABNORMAL LOW (ref 3.87–5.11)
RDW: 12.7 % (ref 11.5–15.5)
WBC: 17 10*3/uL — ABNORMAL HIGH (ref 4.0–10.5)

## 2016-02-11 MED ORDER — HYDROCODONE-ACETAMINOPHEN 5-325 MG PO TABS: 1.0000 | ORAL_TABLET | Freq: Four times a day (QID) | ORAL | Status: DC | PRN

## 2016-02-11 NOTE — Progress Notes (Signed)
90  Spoke with daughter on phone, Discharge instructions reinforced with daughter.  Bethann Punches RN

## 2016-02-11 NOTE — Discharge Summary (Signed)
Physician Discharge Summary  Kim Blankenship [id number] DOB: [birth year] DOA: 02/09/2016  PCP: Abigail Miyamoto, MD  Admit date: 02/09/2016 Discharge date: 02/11/2016  Admitted From:Home Disposition: Home with home care   Recommendations for Outpatient Follow-up:  1. Follow up with PCP in 1-2 weeks 2. Please obtain BMP/CBC in one week. To monitor hemoglobin and WBC.   Home Health: Yes Equipment/Devices: Yes Walker Discharge Condition: Stable CODE STATUS: Full code Diet recommendation: Heart healthy  Brief/Interim Summary:70 year old female with history of hypertension, COPD, A. fib not on anticoagulation, presented after a fall sustaining displaced right femoral neck fracture.  #Fall sustaining displaced right femoral neck fracture: Status post total hip arthroplasty with anterior approach on right side by orthopedics. -Continue supportive care including PT, OT evaluation and treatment. -Aspirin 325 mg twice a day for 2 weeks for DVT prophylaxis as per orthopedics. Patient denied any pain today. She is able to ambulate without difficulties.  #Likely acute blood loss anemia in the setting of surgery: Drop in hemoglobin today. There is no sign of active bleeding. I advised patient to have repeat blood test, CBC in a week with her PCP. She is symptomatically. -Leukocytosis likely stress related. Patient is afebrile.  #COPD: Stable.  #Atrial fibrillation likely paroxysmal: I advised patient to follow-up with her PCP or cardiologist regarding further management and possible discussion regarding anticoagulation therapy.   Patient is medically stable to go home with home care and follow-up with her PCP and orthopedics.  Discharge Diagnoses:  Active Problems:   Closed fracture of right hip (HCC)   Acute blood loss anemia    Discharge Instructions  Discharge Instructions    Call MD / Call 911    Complete by:  As directed    If you experience chest pain or shortness of  breath, CALL 911 and be transported to the hospital emergency room.  If you develope a fever above 101 F, pus (white drainage) or increased drainage or redness at the wound, or calf pain, call your surgeon's office.   Change dressing    Complete by:  As directed    Change dressing on 5, then change the dressing daily with sterile 4 x 4 inch gauze dressing and apply TED hose.  You may clean the incision with alcohol prior to redressing.   Constipation Prevention    Complete by:  As directed    Drink plenty of fluids.  Prune juice may be helpful.  You may use a stool softener, such as Colace (over the counter) 100 mg twice a day.  Use MiraLax (over the counter) for constipation as needed.   Diet - low sodium heart healthy    Complete by:  As directed    Diet - low sodium heart healthy    Complete by:  As directed    Discharge instructions    Complete by:  As directed    F/u PCP and check CBC in one week.   Driving restrictions    Complete by:  As directed    No driving for 2 weeks   Follow the hip precautions as taught in Physical Therapy    Complete by:  As directed    Increase activity slowly    Complete by:  As directed    Increase activity slowly as tolerated    Complete by:  As directed    Patient may shower    Complete by:  As directed    You may shower without a dressing once there is no drainage.  Do not wash over the wound.  If drainage remains, cover wound with plastic wrap and then shower.   Weight bearing as tolerated    Complete by:  As directed        Medication List    STOP taking these medications   ibuprofen 200 MG tablet Commonly known as:  ADVIL,MOTRIN     TAKE these medications   albuterol 108 (90 Base) MCG/ACT inhaler Commonly known as:  PROVENTIL HFA;VENTOLIN HFA Inhale 2 puffs into the lungs every 4 (four) hours as needed for wheezing or shortness of breath.   aspirin EC 325 MG tablet Take 1 tablet (325 mg total) by mouth 2 (two) times daily.    budesonide-formoterol 160-4.5 MCG/ACT inhaler Commonly known as:  SYMBICORT Inhale 2 puffs into the lungs 2 (two) times daily.   diltiazem 300 MG 24 hr capsule Commonly known as:  TIAZAC Take 300 mg by mouth daily.   losartan-hydrochlorothiazide 100-25 MG tablet Commonly known as:  HYZAAR Take 1 tablet by mouth at bedtime.   meclizine 25 MG tablet Commonly known as:  ANTIVERT Take 25 mg by mouth daily as needed (for vertigo).   methocarbamol 500 MG tablet Commonly known as:  ROBAXIN Take 1 tablet (500 mg total) by mouth 2 (two) times daily with a meal.   multivitamin tablet Take 1 tablet by mouth daily with breakfast.   oxyCODONE-acetaminophen 5-325 MG tablet Commonly known as:  ROXICET Take 1 tablet by mouth every 4 (four) hours as needed.   pramipexole 0.25 MG tablet Commonly known as:  MIRAPEX Take 0.25 mg by mouth at bedtime as needed (for restless legs).            Durable Medical Equipment        Start     Ordered   02/09/16 1945  DME Walker rolling  Once     02/09/16 1944   02/09/16 1945  DME 3 n 1  Once     02/09/16 1944   02/09/16 1945  DME Bedside commode  Once     02/09/16 1944     Follow-up Information    Frederik Pear J, MD Follow up in 2 week(s).   Specialty:  Orthopedic Surgery Contact information: [place] [address] [phone]        Abigail Miyamoto, MD. Schedule an appointment as soon as possible for a visit in 1 week(s).   Specialty:  Family Medicine Contact information: [place] [address] [phone]          No Known Allergies  Consultations:Orthopedics   Procedures/Studies: Hip surgery   Subjective: Patient was seen and examined at bedside. Patient was walking in the hallway with help of physical therapy. Patient denied fever, chills, headache, dizziness, nausea, vomiting, chest pain, shortness of breath. No leg pain. Verbalized understanding of follow-up instructions. I  advised patient not to drive at least for 2 weeks.   Discharge Exam: Vitals:   02/11/16 0325 02/11/16 0530  BP: (!) 133/58 (!) 108/52  Pulse: 72 62  Resp: 16 12  Temp: 98.2 F (36.8 C) 98 F (36.7 C)   Vitals:   02/10/16 2139 02/11/16 0325 02/11/16 0530 02/11/16 0856  BP: 133/67 (!) 133/58 (!) 108/52   Pulse: 75 72 62   Resp: 16 16 12    Temp: 98.2 F (36.8 C) 98.2 F (36.8 C) 98 F (36.7 C)   TempSrc: Oral Oral Oral   SpO2: 93% 95% 96% 96%  Weight:      Height:        General: Pt is alert, awake, not in acute distress Cardiovascular: RRR, S1/S2 +, no rubs, no gallops Respiratory: CTA bilaterally, no wheezing, no rhonchi Abdominal: Soft, NT, ND, bowel sounds + Extremities: no edema, no cyanosis Neurology: Alert, awake, nonfocal. Oriented to November 12, [facility] and name.   The results of significant diagnostics from this hospitalization (including imaging, microbiology, ancillary and laboratory) are listed below for reference.     Microbiology: Recent Results (from the past 240 hour(s))  Urine culture     Status: Abnormal   Collection Time: 02/09/16 12:50 PM  Result Value Ref Range Status   Specimen Description URINE, CLEAN CATCH  Final   Special Requests NONE  Final   Culture MULTIPLE SPECIES PRESENT, SUGGEST RECOLLECTION (A)  Final   Report Status 02/10/2016 FINAL  Final  Surgical PCR screen     Status: None   Collection Time: 02/10/16  6:10 AM  Result Value Ref Range Status   MRSA, PCR NEGATIVE NEGATIVE Final   Staphylococcus aureus NEGATIVE NEGATIVE Final    Comment:        The Xpert SA Assay (FDA approved for NASAL specimens in patients over 24 years of age), is one component of a comprehensive surveillance program.  Test performance has been validated by [facility] for patients greater than or equal to 84 year old. It is not intended to diagnose infection nor to guide or monitor treatment.      Labs: BNP (last 3 results) No  results for input(s): BNP in the last 8760 hours. Basic Metabolic Panel:  Recent Labs Lab 02/09/16 1245 02/10/16 0429  NA 134* 136  K 3.3* 3.6  CL 99* 102  CO2 27 28  GLUCOSE 104* 242*  BUN 12 10  CREATININE 0.82 0.69  CALCIUM 9.1 8.5*   Liver Function Tests:  Recent Labs Lab 02/09/16 1245  AST 30  ALT 28  ALKPHOS 68  BILITOT 1.0  PROT 7.1  ALBUMIN 4.3   No results for input(s): LIPASE, AMYLASE in the last 168 hours. No results for input(s): AMMONIA in the last 168 hours. CBC:  Recent Labs Lab 02/09/16 1245 02/10/16 0429 02/11/16 0433  WBC 8.8 12.3* 17.0*  NEUTROABS 6.6  --   --   HGB 13.6 11.4* 9.7*  HCT 37.6 31.7* 26.7*  MCV 88.7 89.5 88.1  PLT 300 261 263   Cardiac Enzymes: No results for input(s): CKTOTAL, CKMB, CKMBINDEX, TROPONINI in the last 168 hours. BNP: Invalid input(s): POCBNP CBG: No results for input(s): GLUCAP in the last 168 hours. D-Dimer No results for input(s): DDIMER in the last 72 hours. Hgb A1c No results for input(s): HGBA1C in the last 72 hours. Lipid Profile No results for input(s): CHOL, HDL, LDLCALC, TRIG, CHOLHDL, LDLDIRECT in the last 72 hours. Thyroid function studies No results for input(s): TSH, T4TOTAL, T3FREE, THYROIDAB in the last 72 hours.  Invalid input(s): FREET3 Anemia work up No results for input(s): VITAMINB12, FOLATE, FERRITIN, TIBC, IRON, RETICCTPCT in the last 72 hours. Urinalysis    Component Value Date/Time   COLORURINE YELLOW 02/09/2016 1250   APPEARANCEUR CLEAR 02/09/2016 1250   LABSPEC 1.014 02/09/2016 1250   PHURINE 6.0 02/09/2016 1250   GLUCOSEU NEGATIVE 02/09/2016 1250   HGBUR NEGATIVE 02/09/2016 1250   BILIRUBINUR NEGATIVE 02/09/2016 1250   KETONESUR NEGATIVE 02/09/2016 1250   PROTEINUR NEGATIVE 02/09/2016 1250   NITRITE NEGATIVE 02/09/2016 1250   LEUKOCYTESUR TRACE (  A) 02/09/2016 1250   Sepsis Labs Invalid input(s): PROCALCITONIN,  WBC,  LACTICIDVEN Microbiology Recent Results (from  the past 240 hour(s))  Urine culture     Status: Abnormal   Collection Time: 02/09/16 12:50 PM  Result Value Ref Range Status   Specimen Description URINE, CLEAN CATCH  Final   Special Requests NONE  Final   Culture MULTIPLE SPECIES PRESENT, SUGGEST RECOLLECTION (A)  Final   Report Status 02/10/2016 FINAL  Final  Surgical PCR screen     Status: None   Collection Time: 02/10/16  6:10 AM  Result Value Ref Range Status   MRSA, PCR NEGATIVE NEGATIVE Final   Staphylococcus aureus NEGATIVE NEGATIVE Final    Comment:        The Xpert SA Assay (FDA approved for NASAL specimens in patients over 67 years of age), is one component of a comprehensive surveillance program.  Test performance has been validated by [facility] for patients greater than or equal to 47 year old. It is not intended to diagnose infection nor to guide or monitor treatment.      Time coordinating discharge: 27 minutes  SIGNED:   Rosita Fire, MD  [facility] 02/11/2016, 11:56 AM  If 7PM-7AM, please contact night-coverage [email or web address] Password [email or web address]

## 2016-02-11 NOTE — Progress Notes (Signed)
PATIENT ID: Kim Blankenship  MRN: [id number]  DOB/AGE:  68-25-1947 / 70 y.o.  2 Days Post-Op Procedure(s) (LRB): TOTAL HIP ARTHROPLASTY ANTERIOR APPROACH (Right)    PROGRESS NOTE Subjective: Patient is alert, oriented, no Nausea, no Vomiting, yes passing gas. Taking PO well. Denies SOB, Chest or Calf Pain. Using Incentive Spirometer, PAS in place. Ambulate WBAT with pt walking 120 ft with therapy, Patient reports pain as mild .    Objective: Vital signs in last 24 hours: Vitals:   02/10/16 2038 02/10/16 2139 02/11/16 0325 02/11/16 0530  BP:  133/67 (!) 133/58 (!) 108/52  Pulse:  75 72 62  Resp:  16 16 12   Temp:  98.2 F (36.8 C) 98.2 F (36.8 C) 98 F (36.7 C)  TempSrc:  Oral Oral Oral  SpO2: 92% 93% 95% 96%  Weight:      Height:          Intake/Output from previous day: I/O last 3 completed shifts: In: 3002.9 [P.O.:1320; I.V.:1682.9] Out: 2250 [Urine:2250]   Intake/Output this shift: No intake/output data recorded.   LABORATORY DATA:  Recent Labs  02/09/16 1245 02/10/16 0429 02/11/16 0433  WBC 8.8 12.3* 17.0*  HGB 13.6 11.4* 9.7*  HCT 37.6 31.7* 26.7*  PLT 300 261 263  NA 134* 136  --   K 3.3* 3.6  --   CL 99* 102  --   CO2 27 28  --   BUN 12 10  --   CREATININE 0.82 0.69  --   GLUCOSE 104* 242*  --   INR 0.88  --   --   CALCIUM 9.1 8.5*  --     Examination: Neurologically intact Neurovascular intact Sensation intact distally Intact pulses distally Dorsiflexion/Plantar flexion intact Incision: dressing C/D/I No cellulitis present Compartment soft} Alert to person, time and place.  Assessment:   2 Days Post-Op Procedure(s) (LRB): TOTAL HIP ARTHROPLASTY ANTERIOR APPROACH (Right) ADDITIONAL DIAGNOSIS: Expected Acute Blood Loss Anemia, COPD  Plan: PT/OT WBAT, Anterior hip precautions  DVT Prophylaxis:  SCDx72hrs, ASA 325 mg BID x 2 weeks DISCHARGE PLAN: Home, when cleared by medicine DISCHARGE NEEDS: HHPT, Walker and 3-in-1 comode seat      PHILLIPS, ERIC R 02/11/2016, 7:37 AM

## 2016-02-11 NOTE — Progress Notes (Signed)
Occupational Therapy Treatment Patient Details Name: Kim Blankenship MRN: [id number] DOB: [birth year] Today's Date: 02/11/2016    History of present illness S/P THA via anterior approach after sustaining a fall/sub capital hip fracture on11/10/17.   OT comments  Pt following anterior THPs but moving quickly.  Cues for safety.  Recommend 24/7 at discharge  Follow Up Recommendations  Supervision/Assistance - 24 hour;No OT follow up    Equipment Recommendations  3 in 1 bedside comode (delivered)    Recommendations for Other Services      Precautions / Restrictions Precautions Precautions: Fall Precaution Comments: antero-lateral precautions, no abduction, no external rotation ,no hyperextension, patient stated the precautions Restrictions Weight Bearing Restrictions: No       Mobility Bed Mobility         Supine to sit: Supervision     General bed mobility comments: from flat bed  Transfers   Equipment used: 4-wheeled walker   Sit to Stand: Supervision         General transfer comment: cues for hand placement    Balance                                   ADL       Grooming: Oral care;Supervision/safety;Standing                   Toilet Transfer: Min guard;Ambulation;BSC;RW   Toileting- Water quality scientist and Hygiene: Min guard;Sit to/from stand         General ADL Comments: cues for safety with transfers as she moved too far to L and cues for hand placement. Pt moving very quickly this am.  Pt not interested in AE; will have daughter assist her      Vision                     Perception     Praxis      Cognition   Behavior During Therapy: WFL for tasks assessed/performed Overall Cognitive Status: Impaired/Different from baseline Area of Impairment: Safety/judgement                General Comments: pt moving quickly but states and follows precautions. Cues for safety.  Asked if she wanted a sheet and  she said no.  Environmental services folllowed immediately afterwards, and pt asked for a sheet.  NT came and and said pt was different yesterday    Extremity/Trunk Assessment               Exercises     Shoulder Instructions       General Comments      Pertinent Vitals/ Pain       Pain Score: 1  Pain Location: R hip Pain Descriptors / Indicators: Sore Pain Intervention(s): Limited activity within patient's tolerance;Monitored during session;Premedicated before session;Repositioned  Home Living                                          Prior Functioning/Environment              Frequency           Progress Toward Goals  OT Goals(current goals can now be found in the care plan section)  Progress towards OT goals: Goals met/education completed, patient discharged from OT (pt states she will have assistance  with LB adls)     Plan      Co-evaluation                 End of Session     Activity Tolerance Patient tolerated treatment well   Patient Left in chair;with call bell/phone within reach   Nurse Communication  (NT to get chair alarm)        Time: 6381-7711 OT Time Calculation (min): 24 min  Charges: OT General Charges $OT Visit: 1 Procedure OT Evaluation $OT Eval Low Complexity: 1 Procedure OT Treatments $Self Care/Home Management : 8-22 mins $Therapeutic Activity: 8-22 mins  Kim Blankenship 02/11/2016, 10:23 AM  Kim Blankenship, OTR/L [phone] 02/11/2016

## 2016-02-11 NOTE — Progress Notes (Signed)
Physical Therapy Treatment Patient Details Name: Kim Blankenship MRN: [id number] DOB: 1946-02-09 Today's Date: 02/11/2016    History of Present Illness S/P THA via anterior approach after sustaining a fall/sub capital hip fracture on11/10/17.    PT Comments    Pt continues to mobilize well. Practiced gait training and stair training. Reviewed anterior hip precautions again on today. Discussed car transfer performance with adherence to hip precautions. All education completed.   Follow Up Recommendations  Home health PT;Supervision/Assistance - 24 hour     Equipment Recommendations  Rolling walker with 5" wheels    Recommendations for Other Services       Precautions / Restrictions Precautions Precautions: Fall;Anterior Hip Precaution Booklet Issued: Yes (comment) Precaution Comments: antero-lateral precautions. Pt unable to recall any of precautions during PT session. Reviewed/demonstrated precautions again. Reviewed handout and instructed her to take it home with her. Restrictions Weight Bearing Restrictions: No    Mobility  Bed Mobility         Supine to sit: Supervision     General bed mobility comments: oob in recliner  Transfers Overall transfer level: Needs assistance Equipment used: Rolling walker (2 wheeled) Transfers: Sit to/from Stand Sit to Stand: Supervision         General transfer comment: cues for hand placement  Ambulation/Gait Ambulation/Gait assistance: Supervision Ambulation Distance (Feet): 100 Feet Assistive device: Rolling walker (2 wheeled) Gait Pattern/deviations: Step-to pattern;Antalgic     General Gait Details: for safety. cues for safety   Stairs Stairs: Yes Stairs assistance: Min assist Stair Management: One rail Left;Forwards Number of Stairs: 2 General stair comments: Pt used rail on R to simulate post. 1 HHA provided on L. VCs safety, technique, sequence.   Wheelchair Mobility    Modified Rankin (Stroke Patients  Only)       Balance                                    Cognition Arousal/Alertness: Awake/alert Behavior During Therapy: Impulsive (somewhat) Overall Cognitive Status: Impaired/Different from baseline Area of Impairment: Safety/judgement     Memory: Decreased recall of precautions         General Comments: pt tearful about sister-n-law passing away.    Exercises      General Comments        Pertinent Vitals/Pain Pain Assessment: 0-10 Pain Score: 2  Pain Location: R hip Pain Descriptors / Indicators: Sore Pain Intervention(s): Monitored during session    Home Living                      Prior Function            PT Goals (current goals can now be found in the care plan section) Progress towards PT goals: Progressing toward goals    Frequency    7X/week      PT Plan Current plan remains appropriate    Co-evaluation             End of Session Equipment Utilized During Treatment: Gait belt Activity Tolerance: Patient tolerated treatment well Patient left: in chair;with call bell/phone within reach. Pt requested chair alarm be removed (Instructed her to call for assistance and to not get up by herself)     Time: 1037-1050 PT Time Calculation (min) (ACUTE ONLY): 13 min  Charges:  $Gait Training: 8-22 mins  G Codes:      Weston Anna, MPT Pager: [phone]

## 2016-02-12 ENCOUNTER — Encounter (HOSPITAL_COMMUNITY): Payer: Self-pay | Admitting: Orthopedic Surgery

## 2016-02-13 ENCOUNTER — Encounter (HOSPITAL_COMMUNITY): Payer: Self-pay | Admitting: Orthopedic Surgery

## 2016-04-05 ENCOUNTER — Emergency Department (HOSPITAL_COMMUNITY): Payer: BLUE CROSS/BLUE SHIELD

## 2016-04-05 ENCOUNTER — Emergency Department (HOSPITAL_COMMUNITY)
Admission: EM | Admit: 2016-04-05 | Discharge: 2016-04-05 | Disposition: A | Discharge status: Home / Self Care | Payer: BLUE CROSS/BLUE SHIELD | Attending: Emergency Medicine | Admitting: Emergency Medicine

## 2016-04-05 ENCOUNTER — Encounter (HOSPITAL_COMMUNITY): Payer: Self-pay

## 2016-04-05 DIAGNOSIS — I10 Essential (primary) hypertension: Secondary | ICD-10-CM | POA: Diagnosis not present

## 2016-04-05 DIAGNOSIS — Z87891 Personal history of nicotine dependence: Secondary | ICD-10-CM | POA: Insufficient documentation

## 2016-04-05 DIAGNOSIS — I639 Cerebral infarction, unspecified: Secondary | ICD-10-CM

## 2016-04-05 DIAGNOSIS — J449 Chronic obstructive pulmonary disease, unspecified: Secondary | ICD-10-CM | POA: Insufficient documentation

## 2016-04-05 DIAGNOSIS — Z7982 Long term (current) use of aspirin: Secondary | ICD-10-CM | POA: Diagnosis not present

## 2016-04-05 DIAGNOSIS — Z79899 Other long term (current) drug therapy: Secondary | ICD-10-CM | POA: Insufficient documentation

## 2016-04-05 DIAGNOSIS — R4182 Altered mental status, unspecified: Secondary | ICD-10-CM | POA: Diagnosis present

## 2016-04-05 DIAGNOSIS — Z96641 Presence of right artificial hip joint: Secondary | ICD-10-CM | POA: Insufficient documentation

## 2016-04-05 LAB — CBC WITH DIFFERENTIAL/PLATELET
Basophils Absolute: 0 10*3/uL (ref 0.0–0.1)
Basophils Relative: 1 %
Eosinophils Absolute: 0.1 10*3/uL (ref 0.0–0.7)
Eosinophils Relative: 1 %
HCT: 35.7 % — ABNORMAL LOW (ref 36.0–46.0)
Hemoglobin: 12 g/dL (ref 12.0–15.0)
Lymphocytes Relative: 24 %
Lymphs Abs: 1.7 10*3/uL (ref 0.7–4.0)
MCH: 30.1 pg (ref 26.0–34.0)
MCHC: 33.6 g/dL (ref 30.0–36.0)
MCV: 89.5 fL (ref 78.0–100.0)
Monocytes Absolute: 0.4 10*3/uL (ref 0.1–1.0)
Monocytes Relative: 6 %
Neutro Abs: 4.9 10*3/uL (ref 1.7–7.7)
Neutrophils Relative %: 68 %
Platelets: 325 10*3/uL (ref 150–400)
RBC: 3.99 MIL/uL (ref 3.87–5.11)
RDW: 13.1 % (ref 11.5–15.5)
WBC: 7.1 10*3/uL (ref 4.0–10.5)

## 2016-04-05 LAB — URINALYSIS, ROUTINE W REFLEX MICROSCOPIC
Bacteria, UA: NONE SEEN
Bilirubin Urine: NEGATIVE
Glucose, UA: NEGATIVE mg/dL
Hgb urine dipstick: NEGATIVE
Ketones, ur: NEGATIVE mg/dL
Nitrite: NEGATIVE
Protein, ur: NEGATIVE mg/dL
Specific Gravity, Urine: 1.006 (ref 1.005–1.030)
pH: 7 (ref 5.0–8.0)

## 2016-04-05 LAB — COMPREHENSIVE METABOLIC PANEL
ALT: 17 U/L (ref 14–54)
AST: 24 U/L (ref 15–41)
Albumin: 3.6 g/dL (ref 3.5–5.0)
Alkaline Phosphatase: 77 U/L (ref 38–126)
Anion gap: 9 (ref 5–15)
BUN: 8 mg/dL (ref 6–20)
CO2: 23 mmol/L (ref 22–32)
Calcium: 9 mg/dL (ref 8.9–10.3)
Chloride: 104 mmol/L (ref 101–111)
Creatinine, Ser: 0.75 mg/dL (ref 0.44–1.00)
GFR calc Af Amer: >60 mL/min (ref >60)
GFR calc non Af Amer: >60 mL/min (ref >60)
Glucose, Bld: 99 mg/dL (ref 65–99)
Potassium: 3.2 mmol/L — ABNORMAL LOW (ref 3.5–5.1)
Sodium: 136 mmol/L (ref 135–145)
Total Bilirubin: 0.5 mg/dL (ref 0.3–1.2)
Total Protein: 6.2 g/dL — ABNORMAL LOW (ref 6.5–8.1)

## 2016-04-05 MED ORDER — ASPIRIN EC 325 MG PO TBEC
325.0000 mg | DELAYED_RELEASE_TABLET | Freq: Once | ORAL | Status: AC
  Administered 2016-04-05: 325 mg via ORAL
  Filled 2016-04-05: qty 1

## 2016-04-05 MED ORDER — SODIUM CHLORIDE 0.9 % IV SOLN: INTRAVENOUS | Status: DC

## 2016-04-05 MED ORDER — SODIUM CHLORIDE 0.9 % IV BOLUS (SEPSIS)
1000.0000 mL | Freq: Once | INTRAVENOUS | Status: AC
  Administered 2016-04-05: 1000 mL via INTRAVENOUS

## 2016-04-05 MED ORDER — ASPIRIN EC 325 MG PO TBEC: 325.0000 mg | DELAYED_RELEASE_TABLET | Freq: Two times a day (BID) | ORAL | 0 refills | Status: DC

## 2016-04-05 NOTE — Discharge Planning (Signed)
Pt up for discharge. EDCM reviewed chart for possible CM needs.  No needs identified or communicated.  

## 2016-04-05 NOTE — ED Notes (Addendum)
Pt is concerned that she has been having TIAs -- has been having "memory lapses". Forgets when she is supposed to go to work-- would go on the wrong day. Having a difficult time thinking of words to finish sentences, misplacing things. Also states "missing days- got up, thought it was morning, fed dogs, was going to work -- daughter stopped pt-- it was the afternoon, and she did not have to go to work.  At present -- pt is alert/oriented x 4. Had hip replacement in November. Thinks that most of this started after that.  Pt was sent here from [facility] for UTI--frequent also. No uti symptoms.

## 2016-04-05 NOTE — ED Triage Notes (Signed)
Per Pt, pt is coming from home with reports of episodes of confusion and aphasia for the last two weeks. Pt reports having Hx of HTN that is controlled by medication and 44 lb weight loss in the last year. Pt is alert and oriented x4 upon arrival to the ED.

## 2016-04-05 NOTE — ED Provider Notes (Signed)
[facility] DEPT Provider Note   CSN: [id number] Arrival date & time: 04/05/16  1144     History   Chief Complaint Chief Complaint  Patient presents with  . Altered Mental Status    HPI Tene Amicucci is a 71 y.o. female.  Pt presents to the ED today with "memory lapses."  She said they have been intermittently happening since she had a total hip arthroplasty by Dr. Mayer Camel on 11/10.  The pt said she forgets when she's supposed to go to work.  She has been having a difficult time thinking of words to complete sentences.  She said her family finally "bullied" her into coming into the ED.  Pt also reports a chronic UTI since she had her hip surgery.  She has been on multiple abx.      Past Medical History:  Diagnosis Date  . Asthma   . Atrial fibrillation ([place])   . COPD (chronic obstructive pulmonary disease) ([place])   . Hypertension   . Irregular heart rate     Patient Active Problem List   Diagnosis Date Noted  . Acute blood loss anemia   . Closed fracture of right hip ([place]) 02/09/2016  . Chest pain 11/07/2015  . COPD (chronic obstructive pulmonary disease) ([place]) 08/19/2013  . COPD exacerbation ([place]) 08/19/2013  . Abnormal EKG 08/19/2013  . Fever, unspecified 08/19/2013    Past Surgical History:  Procedure Laterality Date  . CESAREAN SECTION    . Left Foot Reconstruction    . OPEN REDUCTION INTERNAL FIXATION (ORIF) METACARPAL Right 03/10/2015   Procedure: OPEN REDUCTION INTERNAL FIXATION (ORIF) RIGHT LONG  METACARPAL FRACTURE;  Surgeon: Leanora Cover, MD;  Location: [facility];  Service: Orthopedics;  Laterality: Right;  . TONSILLECTOMY    . TOTAL HIP ARTHROPLASTY Right 02/09/2016   Procedure: TOTAL HIP ARTHROPLASTY ANTERIOR APPROACH;  Surgeon: Frederik Pear, MD;  Location: [facility];  Service: Orthopedics;  Laterality: Right;    OB History    No data available       Home Medications    Prior to Admission medications   Medication Sig Start Date End  Date Taking? Authorizing Provider  albuterol (PROVENTIL HFA;VENTOLIN HFA) 108 (90 BASE) MCG/ACT inhaler Inhale 2 puffs into the lungs every 4 (four) hours as needed for wheezing or shortness of breath.   Yes Historical Provider, MD  budesonide-formoterol (SYMBICORT) 160-4.5 MCG/ACT inhaler Inhale 2 puffs into the lungs 2 (two) times daily.   Yes Historical Provider, MD  diltiazem (TIAZAC) 300 MG 24 hr capsule Take 300 mg by mouth daily. 01/09/16  Yes Historical Provider, MD  ibuprofen (ADVIL,MOTRIN) 200 MG tablet Take 600-800 mg by mouth every 6 (six) hours as needed for headache or moderate pain.   Yes Historical Provider, MD  losartan-hydrochlorothiazide (HYZAAR) 100-25 MG tablet Take 1 tablet by mouth at bedtime.  01/09/16  Yes Historical Provider, MD  Multiple Vitamin (MULTIVITAMIN) tablet Take 1 tablet by mouth daily with breakfast.    Yes Historical Provider, MD  pramipexole (MIRAPEX) 0.25 MG tablet Take 0.25 mg by mouth at bedtime as needed (for restless legs).    Yes Historical Provider, MD  aspirin EC 325 MG tablet Take 1 tablet (325 mg total) by mouth 2 (two) times daily. 04/05/16   Isla Pence, MD  meclizine (ANTIVERT) 25 MG tablet Take 25 mg by mouth daily as needed (for vertigo).  12/12/15   Historical Provider, MD  methocarbamol (ROBAXIN) 500 MG tablet Take 1 tablet (500 mg total) by mouth  2 (two) times daily with a meal. Patient not taking: Reported on 04/05/2016 02/09/16   Leighton Parody, PA-C  oxyCODONE-acetaminophen (ROXICET) 5-325 MG tablet Take 1 tablet by mouth every 4 (four) hours as needed. Patient not taking: Reported on 04/05/2016 02/09/16   Leighton Parody, PA-C    Family History No family history on file.  Social History Social History  Substance Use Topics  . Smoking status: Former Smoker    Types: Cigarettes  . Smokeless tobacco: Never Used  . Alcohol use Yes     Comment: wine daily     Allergies   Patient has no known allergies.   Review of Systems Review  of Systems  Neurological:       Memory lapses  All other systems reviewed and are negative.    Physical Exam Updated Vital Signs BP 119/94 (BP Location: Right Arm)   Pulse 74   Temp 98.3 F (36.8 C) (Oral)   Resp 17   Ht 5\' 5"  (1.651 m)   Wt 124 lb (56.2 kg)   SpO2 97%   BMI 20.63 kg/m   Physical Exam  Constitutional: She is oriented to person, place, and time. She appears well-developed and well-nourished.  HENT:  Head: Normocephalic and atraumatic.  Right Ear: External ear normal.  Left Ear: External ear normal.  Nose: Nose normal.  Mouth/Throat: Oropharynx is clear and moist.  Eyes: Conjunctivae and EOM are normal. Pupils are equal, round, and reactive to light.  Neck: Normal range of motion. Neck supple.  Cardiovascular: Normal rate, regular rhythm, normal heart sounds and intact distal pulses.   Pulmonary/Chest: Effort normal and breath sounds normal.  Abdominal: Soft. Bowel sounds are normal.  Musculoskeletal: Normal range of motion.  Neurological: She is alert and oriented to person, place, and time.  Skin: Skin is warm.  Psychiatric: She has a normal mood and affect. Her behavior is normal. Judgment and thought content normal.  Nursing note and vitals reviewed.    ED Treatments / Results  Labs (all labs ordered are listed, but only abnormal results are displayed) Labs Reviewed  CBC WITH DIFFERENTIAL/PLATELET - Abnormal; Notable for the following:       Result Value   HCT 35.7 (*)    All other components within normal limits  COMPREHENSIVE METABOLIC PANEL - Abnormal; Notable for the following:    Potassium 3.2 (*)    Total Protein 6.2 (*)    All other components within normal limits  URINALYSIS, ROUTINE W REFLEX MICROSCOPIC - Abnormal; Notable for the following:    Color, Urine STRAW (*)    APPearance HAZY (*)    Leukocytes, UA MODERATE (*)    Squamous Epithelial / LPF 0-5 (*)    All other components within normal limits    EKG  EKG  Interpretation  Date/Time:  Friday April 05 2016 11:52:50 EST Ventricular Rate:  80 PR Interval:  124 QRS Duration: 112 QT Interval:  436 QTC Calculation: 502 R Axis:   73 Text Interpretation:  Normal sinus rhythm Nonspecific ST abnormality Prolonged QT Abnormal ECG Confirmed by Geralda Baumgardner MD, Harmonee Tozer ([id number]) on 04/05/2016 1:15:24 PM       Radiology Dg Chest 2 View  Result Date: 04/05/2016 CLINICAL DATA:  Altered mental status over the past couple of days. Cough for 2-3 weeks. Dizziness for 1 month. EXAM: CHEST  2 VIEW COMPARISON:  Single-view of the chest 02/09/2016. PA and lateral chest 08/19/2013. FINDINGS: The lungs are clear. Heart size is normal. No pneumothorax  or pleural effusion. Remote left seventh rib fracture is noted. IMPRESSION: No acute disease. Electronically Signed   By: Inge Rise M.D.   On: 04/05/2016 13:10   Ct Head Wo Contrast  Result Date: 04/05/2016 CLINICAL DATA:  71 year old female with memory loss. EXAM: CT HEAD WITHOUT CONTRAST TECHNIQUE: Contiguous axial images were obtained from the base of the skull through the vertex without intravenous contrast. COMPARISON:  12/19/2015 FINDINGS: Brain: There is a focal area of low attenuation within the subcortical white matter of the posterior left frontal lobe which may represent a subacute or chronic subcortical infarct. No evidence for acute infarction, hemorrhage, hydrocephalus or extra-axial collection or mass lesion/ mass effect. Vascular: No hyperdense vessel or unexpected calcification. Skull: Normal. Negative for fracture or focal lesion. Sinuses/Orbits: No acute finding. Other: None. IMPRESSION: 1. No acute intracranial abnormality. 2. Focal area of low attenuation within the subcortical white matter of the right cerebral hemisphere compatible with a subacute or chronic subcortical infarct. Electronically Signed   By: Kerby Moors M.D.   On: 04/05/2016 12:53    Procedures Procedures (including critical care  time)  Medications Ordered in ED Medications  sodium chloride 0.9 % bolus 1,000 mL (1,000 mLs Intravenous New Bag/Given 04/05/16 1438)    And  0.9 %  sodium chloride infusion (not administered)  aspirin EC tablet 325 mg (325 mg Oral Given 04/05/16 1439)     Initial Impression / Assessment and Plan / ED Course  I have reviewed the triage vital signs and the nursing notes.  Pertinent labs & imaging results that were available during my care of the patient were reviewed by me and considered in my medical decision making (see chart for details).  Clinical Course    Pt here with sx since November.  I suspect she had her stroke during her surgery.  She has not been taking asa, so I told her that she needed to start that again.  She is instructed to f/u with neurology.  Return if worse.  Final Clinical Impressions(s) / ED Diagnoses   Final diagnoses:  Cerebrovascular accident (CVA), unspecified mechanism [facility])    New Prescriptions Current Discharge Medication List       Isla Pence, MD 04/05/16 1511

## 2016-04-05 NOTE — ED Notes (Signed)
Patient has returned from being out of the department; placed patient back on monitor, continuous pulse oximetry and blood pressure cuff

## 2016-04-05 NOTE — ED Notes (Signed)
Patient has returned again from being out of the department; patient placed back on monitor, continuous pulse oximetry and blood pressure cuff

## 2016-04-05 NOTE — ED Notes (Signed)
Patient able to ambulate independently  

## 2016-04-11 ENCOUNTER — Ambulatory Visit: Payer: Self-pay | Admitting: Neurology

## 2016-04-11 ENCOUNTER — Telehealth: Payer: Self-pay

## 2016-04-11 NOTE — Telephone Encounter (Signed)
Pt did not show for their appt with Dr. Dohmeier today.  

## 2016-04-17 ENCOUNTER — Emergency Department (HOSPITAL_COMMUNITY): Payer: BLUE CROSS/BLUE SHIELD

## 2016-04-17 ENCOUNTER — Encounter (HOSPITAL_COMMUNITY): Payer: Self-pay | Admitting: Emergency Medicine

## 2016-04-17 ENCOUNTER — Emergency Department (HOSPITAL_COMMUNITY)
Admission: EM | Admit: 2016-04-17 | Discharge: 2016-04-17 | Disposition: A | Discharge status: Home / Self Care | Payer: BLUE CROSS/BLUE SHIELD | Attending: Emergency Medicine | Admitting: Emergency Medicine

## 2016-04-17 DIAGNOSIS — Z96641 Presence of right artificial hip joint: Secondary | ICD-10-CM | POA: Insufficient documentation

## 2016-04-17 DIAGNOSIS — S92411A Displaced fracture of proximal phalanx of right great toe, initial encounter for closed fracture: Secondary | ICD-10-CM | POA: Diagnosis not present

## 2016-04-17 DIAGNOSIS — Y999 Unspecified external cause status: Secondary | ICD-10-CM | POA: Diagnosis not present

## 2016-04-17 DIAGNOSIS — I10 Essential (primary) hypertension: Secondary | ICD-10-CM | POA: Insufficient documentation

## 2016-04-17 DIAGNOSIS — Z7982 Long term (current) use of aspirin: Secondary | ICD-10-CM | POA: Insufficient documentation

## 2016-04-17 DIAGNOSIS — Z87891 Personal history of nicotine dependence: Secondary | ICD-10-CM | POA: Insufficient documentation

## 2016-04-17 DIAGNOSIS — J449 Chronic obstructive pulmonary disease, unspecified: Secondary | ICD-10-CM | POA: Insufficient documentation

## 2016-04-17 DIAGNOSIS — Y92410 Unspecified street and highway as the place of occurrence of the external cause: Secondary | ICD-10-CM | POA: Diagnosis not present

## 2016-04-17 DIAGNOSIS — Y939 Activity, unspecified: Secondary | ICD-10-CM | POA: Insufficient documentation

## 2016-04-17 DIAGNOSIS — S99921A Unspecified injury of right foot, initial encounter: Secondary | ICD-10-CM | POA: Diagnosis present

## 2016-04-17 MED ORDER — HYDROCODONE-ACETAMINOPHEN 5-325 MG PO TABS: 1.0000 | ORAL_TABLET | Freq: Four times a day (QID) | ORAL | 0 refills | Status: DC | PRN

## 2016-04-17 MED ORDER — HYDROCODONE-ACETAMINOPHEN 5-325 MG PO TABS
2.0000 | ORAL_TABLET | Freq: Once | ORAL | Status: AC
Start: 2016-04-17 — End: 2016-04-17
  Administered 2016-04-17: 2 via ORAL
  Filled 2016-04-17: qty 2

## 2016-04-17 NOTE — ED Notes (Signed)
Patient transported to X-ray 
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
## 2016-04-17 NOTE — Discharge Instructions (Signed)
Hydrocodone as prescribed as needed for pain.  Follow-up with orthopedics in the next 3 days. The contact information for Dr. Ninfa Linden has been provided in this discharge summary for you to call and make these arrangements.  Wear cam walker as applied until followed up by orthopedics.
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
## 2016-04-30 ENCOUNTER — Encounter: Payer: Self-pay | Admitting: Neurology

## 2016-04-30 ENCOUNTER — Ambulatory Visit (INDEPENDENT_AMBULATORY_CARE_PROVIDER_SITE_OTHER): Payer: BLUE CROSS/BLUE SHIELD | Admitting: Neurology

## 2016-04-30 VITALS — BP 169/74 | HR 69 | Ht 65.0 in | Wt 132.0 lb

## 2016-04-30 DIAGNOSIS — I670 Dissection of cerebral arteries, nonruptured: Secondary | ICD-10-CM

## 2016-04-30 DIAGNOSIS — R42 Dizziness and giddiness: Secondary | ICD-10-CM

## 2016-04-30 DIAGNOSIS — I639 Cerebral infarction, unspecified: Secondary | ICD-10-CM

## 2016-04-30 DIAGNOSIS — I480 Paroxysmal atrial fibrillation: Secondary | ICD-10-CM

## 2016-04-30 NOTE — Progress Notes (Signed)
[facility]    Provider:  Dr Jaynee Eagles Referring Provider: Briscoe Deutscher, MD Primary Care Physician:  Abigail Miyamoto, MD  CC:  TIA  HPI:  Kim Blankenship is a 71 y.o. female here as a referral from Dr. Maceo Pro for possible TIA. Past medical history asthma, atrial fibrillation, COPD, hypertension. She is on aspirin 325. She had hip surgery in November and since then she has been having some difficulty with cognition, terrible dizziness especially in the morning, she feels lightheaded, she has to hold onto the bed because she is afraid she is going to fall, no room spinning or nausea, symptoms resolve in a few minutes but she still feels it now while sitting. She has difficulty remembering dates, remembering something she is going to do, daily chores forgetting. She has not gone back to work yet. She denies atrial fibrillation and not sure who diagnosed her with this condition in the past. She feels like she gets an irregular heart beat sometimes feels palpitations every few months. She has never been on a blood thinner. No diabetes. She is on ASA 81mg  but she was not on that before these symptoms appeared. Denies slurred speech, weakness, numbness, tingling, vision changes or any other focal neurologic deficit. She c/o short term memory problems, she is losing things and forgets where she puts things since surgery. Her parents died at dad 45 and mom 56, father had memory problems but unclear what kind. Symptoms have not worsened or improved, no triggers just continuous every day worse in the morning with the dizziness. No other focal neurologic deficits, associated symptoms, inciting events or modifiable factors.    Reviewed notes, labs and imaging from outside physicians, which showed:   She presented to the emergency room in early January of this year with reported memory lapses. She had a hip arthroplasty in November 2017 and since then she's been having memory problems, she forgets  when she is supposed to go to work, having a difficult time thinking of words to complete sentences. She also reports a chronic UTI since she had her hip surgery. She's been on multiple antibiotics.  CT head 04/05/2016: Personally reviewed images and agree with the following  FINDINGS: Brain: There is a focal area of low attenuation within the subcortical white matter of the posterior left frontal lobe which may represent a subacute or chronic subcortical infarct. No evidence for acute infarction, hemorrhage, hydrocephalus or extra-axial collection or mass lesion/ mass effect.  Vascular: No hyperdense vessel or unexpected calcification.  Skull: Normal. Negative for fracture or focal lesion.  Sinuses/Orbits: No acute finding.  Other: None.  IMPRESSION: 1. No acute intracranial abnormality. 2. Focal area of low attenuation within the subcortical white matter of the right cerebral hemisphere compatible with a subacute or chronic subcortical infarct.   Review of Systems: Patient complains of symptoms per HPI as well as the following symptoms: no CP, no SOB. Pertinent negatives per HPI. All others negative.   Social History   Social History  . Marital status: Divorced    Spouse name: N/A  . Number of children: 1  . Years of education: N/A   Occupational History  . Not on file.   Social History Main Topics  . Smoking status: Former Smoker    Types: Cigarettes  . Smokeless tobacco: Never Used  . Alcohol use Yes     Comment: wine daily  . Drug use: No  . Sexual activity: Not on file   Other Topics Concern  .  Not on file   Social History Narrative   Lives with daughter and her son   Caffeine use: 1 cup coffee every morning    Family History  Problem Relation Age of Onset  . Dementia Father     Past Medical History:  Diagnosis Date  . Asthma   . Atrial fibrillation ([place])   . COPD (chronic obstructive pulmonary disease) ([place])   . Hypertension   .  Irregular heart rate     Past Surgical History:  Procedure Laterality Date  . CESAREAN SECTION    . Left Foot Reconstruction    . OPEN REDUCTION INTERNAL FIXATION (ORIF) METACARPAL Right 03/10/2015   Procedure: OPEN REDUCTION INTERNAL FIXATION (ORIF) RIGHT LONG  METACARPAL FRACTURE;  Surgeon: Leanora Cover, MD;  Location: [facility];  Service: Orthopedics;  Laterality: Right;  . TONSILLECTOMY    . TOTAL HIP ARTHROPLASTY Right 02/09/2016   Procedure: TOTAL HIP ARTHROPLASTY ANTERIOR APPROACH;  Surgeon: Frederik Pear, MD;  Location: [facility];  Service: Orthopedics;  Laterality: Right;    Current Outpatient Prescriptions  Medication Sig Dispense Refill  . albuterol (PROVENTIL HFA;VENTOLIN HFA) 108 (90 BASE) MCG/ACT inhaler Inhale 2 puffs into the lungs every 4 (four) hours as needed for wheezing or shortness of breath.    Marland Kitchen aspirin EC 325 MG tablet Take 1 tablet (325 mg total) by mouth 2 (two) times daily. 30 tablet 0  . budesonide-formoterol (SYMBICORT) 160-4.5 MCG/ACT inhaler Inhale 2 puffs into the lungs 2 (two) times daily.    Marland Kitchen diltiazem (TIAZAC) 300 MG 24 hr capsule Take 300 mg by mouth daily.    Marland Kitchen ibuprofen (ADVIL,MOTRIN) 200 MG tablet Take 600-800 mg by mouth every 6 (six) hours as needed for headache or moderate pain.    Marland Kitchen losartan-hydrochlorothiazide (HYZAAR) 100-25 MG tablet Take 1 tablet by mouth at bedtime.     . meclizine (ANTIVERT) 25 MG tablet Take 25 mg by mouth daily as needed (for vertigo).     . Multiple Vitamin (MULTIVITAMIN) tablet Take 1 tablet by mouth daily with breakfast.     . oxyCODONE-acetaminophen (ROXICET) 5-325 MG tablet Take 1 tablet by mouth every 4 (four) hours as needed. 60 tablet 0  . pramipexole (MIRAPEX) 0.25 MG tablet Take 0.25 mg by mouth at bedtime as needed (for restless legs).      No current facility-administered medications for this visit.     Allergies as of 04/30/2016  . (No Known Allergies)    Vitals: BP (!) 169/74 (BP Location:  Left Arm, Patient Position: Sitting, Cuff Size: Normal)   Pulse 69   Ht 5\' 5"  (1.651 m)   Wt 132 lb (59.9 kg)   BMI 21.97 kg/m  Last Weight:  Wt Readings from Last 1 Encounters:  04/30/16 132 lb (59.9 kg)   Last Height:   Ht Readings from Last 1 Encounters:  04/30/16 5\' 5"  (1.651 m)    Physical exam: Exam: Gen: NAD, conversant, well nourised, thin                      CV: RRR, no MRG. No Carotid Bruits. No peripheral edema, warm, nontender Eyes: Conjunctivae clear without exudates or hemorrhage  Neuro: Detailed Neurologic Exam  Speech:    Speech is normal; fluent and spontaneous with normal comprehension.  Cognition:    The patient is oriented to person, place, and time;     recent and remote memory intact;     language fluent;  normal attention, concentration,     fund of knowledge Cranial Nerves:    The pupils are equal, round, and reactive to light. The fundi are normal and spontaneous venous pulsations are present. Visual fields are full to finger confrontation. Extraocular movements are intact. Trigeminal sensation is intact and the muscles of mastication are normal. The face is symmetric. The palate elevates in the midline. Hearing intact. Voice is normal. Shoulder shrug is normal. The tongue has normal motion without fasciculations.   Coordination:    Normal finger to nose and heel to shin. Normal rapid alternating movements.   Gait:    Heel-toe and tandem gait are normal.   Motor Observation:    No asymmetry, no atrophy, and no involuntary movements noted. Tone:    Normal muscle tone.    Posture:    Posture is normal. normal erect    Strength:    Strength is V/V in the upper and lower limbs.      Sensation: intact to LT     Reflex Exam:  DTR's:    Deep tendon reflexes in the upper and lower extremities are brisk bilaterally.   Toes:    The toes are downgoing bilaterally.   Clonus:    Clonus is absent.      Assessment/Plan:  71 year old  female with  A past medical history asthma, atrial fibrillation, COPD, hypertension with memory complaints and dizziness since hip arthroplasty in November. Recent CT of the head revealed a possible area of low attenuation within the subcortical white matter of the posterior left frontal lobe which may represent a subacute or chronic subcortical infarct.   MRI brain to evaluate stroke, MRA of the head and neck to evaluate for thromboembolic disease or dissection. Echocardiogram  holter monitor - 30 day to eval for afib labwork today including cbc, bmp,hgba1c, lipid panel  I had a long d/w patient about her recent possible stroke, risk for recurrent stroke/TIAs, personally independently reviewed imaging studies and answered questions.Continue ASA for secondary stroke prevention and maintain strict control of hypertension with blood pressure goal below 130/90, diabetes with hemoglobin A1c goal below 6.5% and lipids with LDL cholesterol goal below 70 mg/dL. I also advised the patient to eat a healthy diet with plenty of whole grains, cereals, fruits and vegetables, exercise regularly and maintain ideal body weight .Advised if atrial fibrillation is confirmed she is at increased risk for embolic strokes and I recommend Eliquis.  Orders Placed This Encounter  Procedures  . MR BRAIN WO CONTRAST  . MR MRA HEAD WO CONTRAST  . MR MRA NECK W WO CONTRAST  . Lipid panel  . Hemoglobin A1c  . Basic Metabolic Panel  . CBC  . Cardiac event monitor  . ECHOCARDIOGRAM COMPLETE   Cc: Dr. Ishmael Holter, MD  [facility] [address] [place], [place] [address]  Phone [phone] Fax [phone]

## 2016-04-30 NOTE — Patient Instructions (Signed)
Remember to drink plenty of fluid, eat healthy meals and do not skip any meals. Try to eat protein with a every meal and eat a healthy snack such as fruit or nuts in between meals. Try to keep a regular sleep-wake schedule and try to exercise daily, particularly in the form of walking, 20-30 minutes a day, if you can.   As far as your medications are concerned, I would like to suggest: Continue Aspirin  As far as diagnostic testing: MRI of the brain and blood vessels of the head and neck, labs, echocardiogram of the heart and possibly a 30-day heart monitor (holter monitor)  I would like to see you back in 4 months, sooner if we need to. Please call us with any interim questions, concerns, problems, updates or refill requests.   Our phone number is [phone]. We also have an after hours call service for urgent matters and there is a physician on-call for urgent questions. For any emergencies you know to call 911 or go to the nearest emergency room

## 2016-05-01 ENCOUNTER — Telehealth: Payer: Self-pay | Admitting: Neurology

## 2016-05-01 LAB — BASIC METABOLIC PANEL
BUN/Creatinine Ratio: 12 (ref 12–28)
BUN: 9 mg/dL (ref 8–27)
CO2: 26 mmol/L (ref 18–29)
Calcium: 10 mg/dL (ref 8.7–10.3)
Chloride: 97 mmol/L (ref 96–106)
Creatinine, Ser: 0.73 mg/dL (ref 0.57–1.00)
GFR calc Af Amer: 98 mL/min/{1.73_m2} (ref >59)
GFR calc non Af Amer: 85 mL/min/{1.73_m2} (ref >59)
Glucose: 97 mg/dL (ref 65–99)
Potassium: 4.5 mmol/L (ref 3.5–5.2)
Sodium: 139 mmol/L (ref 134–144)

## 2016-05-01 LAB — CBC
Hematocrit: 41.9 % (ref 34.0–46.6)
Hemoglobin: 13.8 g/dL (ref 11.1–15.9)
MCH: 29.9 pg (ref 26.6–33.0)
MCHC: 32.9 g/dL (ref 31.5–35.7)
MCV: 91 fL (ref 79–97)
Platelets: 415 10*3/uL — ABNORMAL HIGH (ref 150–379)
RBC: 4.62 x10E6/uL (ref 3.77–5.28)
RDW: 14.3 % (ref 12.3–15.4)
WBC: 9 10*3/uL (ref 3.4–10.8)

## 2016-05-01 LAB — LIPID PANEL
Chol/HDL Ratio: 2.5 ratio units (ref 0.0–4.4)
Cholesterol, Total: 228 mg/dL — ABNORMAL HIGH (ref 100–199)
HDL: 91 mg/dL (ref >39)
LDL Calculated: 122 mg/dL — ABNORMAL HIGH (ref 0–99)
Triglycerides: 76 mg/dL (ref 0–149)
VLDL Cholesterol Cal: 15 mg/dL (ref 5–40)

## 2016-05-01 LAB — HEMOGLOBIN A1C
Est. average glucose Bld gHb Est-mCnc: 103 mg/dL
Hgb A1c MFr Bld: 5.2 % (ref 4.8–5.6)

## 2016-05-01 NOTE — Telephone Encounter (Signed)
[employer] did not approve the exams it was sent to clinical review. The phone number for the peer to peer is [phone] select option 3. The case numbers are [id number], [id number] and [id number]. Please do in 2 business days, thank you for your help.

## 2016-05-02 ENCOUNTER — Telehealth: Payer: Self-pay | Admitting: *Deleted

## 2016-05-02 NOTE — Telephone Encounter (Signed)
Noted, Thank you for your help! °

## 2016-05-02 NOTE — Telephone Encounter (Signed)
-----   Message from Melvenia Beam, MD sent at 05/02/2016 10:23 AM EST ----- Her cholesterol is elevated, LDL is 122 and we recommend LDL < 70 after a stroke so I recommend we start a statin low dose possibly lipitor 10mg  in the evenings and she follows with pcp. The most common side effects include muscle pain, muscle aches by you can have other reactions such as headache, diarrhea, urinary tract infection, GI problems such as dyspepsia and nausea, insomnia, abdominal pain, constipation and other side effects this is not a complete list. Most people do tolerated fine. I would recommend starting very low dose. If she has any severe muscle aches stopped immediately. Please let me know she is willing to do this and I prescribed. Please forward this to her primary care.

## 2016-05-02 NOTE — Telephone Encounter (Signed)
LVM for pt to call about results. Gave [facility] phone number.  

## 2016-05-02 NOTE — Telephone Encounter (Signed)
MRI brain: 2/1-3/18 [id number] MRA neck: 2/1-3/18 [id number] MRA head 2/1-3/18 [id number]

## 2016-05-06 NOTE — Telephone Encounter (Signed)
Called and spoke to patient about lab results per AA,MD note. She verbalized udnerstnading and said she saw her PCP same day after she saw Dr Jaynee Eagles and he put her on lipitor 20mg  daily. She has a f/u with him in a few weeks. I added med to pt list and advised I will forward to AA,MD as FYI. She verbalized understanding.

## 2016-05-30 ENCOUNTER — Other Ambulatory Visit: Payer: Self-pay | Admitting: Neurology

## 2016-05-30 ENCOUNTER — Ambulatory Visit (HOSPITAL_COMMUNITY): Payer: BLUE CROSS/BLUE SHIELD | Attending: Cardiology

## 2016-05-30 DIAGNOSIS — I639 Cerebral infarction, unspecified: Secondary | ICD-10-CM

## 2016-05-30 DIAGNOSIS — I480 Paroxysmal atrial fibrillation: Secondary | ICD-10-CM

## 2016-05-30 DIAGNOSIS — R42 Dizziness and giddiness: Secondary | ICD-10-CM

## 2016-06-03 ENCOUNTER — Telehealth: Payer: Self-pay | Admitting: Neurology

## 2016-06-03 NOTE — Telephone Encounter (Signed)
That is fine, you have spoken to patient and tried - it is up to her. Thank you SO much for trying so hard to get her scheduled.

## 2016-06-03 NOTE — Telephone Encounter (Signed)
Dr Jaynee Eagles,  I have tried on  several occasions to get the patient scheduled for her monitor appt. She No showed for 1 appt and since I have not been able to get her rescheduled. Please see the notes below. I have removed the order from our work que.   06/03/2016 lmom for pt to call and schedule monitor appt.  stpegram  05/13/2016 spoke with patient she was at work and stated she would call back to schedule echo and event monitor when she gets home to check her calendar.lm 05/01/2016 Called pt lmom for pt to call and schedule Monitor. stpegram   Thanks  [employer]

## 2016-06-26 ENCOUNTER — Telehealth: Payer: Self-pay

## 2016-06-26 ENCOUNTER — Ambulatory Visit: Payer: BLUE CROSS/BLUE SHIELD | Admitting: Neurology

## 2016-06-26 NOTE — Telephone Encounter (Signed)
Pt no-showed her appt this a.m. 

## 2016-07-01 ENCOUNTER — Encounter: Payer: Self-pay | Admitting: Neurology

## 2016-10-29 ENCOUNTER — Inpatient Hospital Stay (HOSPITAL_COMMUNITY)
Admission: EM | Admit: 2016-10-29 | Discharge: 2016-11-03 | DRG: 071 | Discharge status: Against Medical Advice | Payer: Medicare Other | Attending: Internal Medicine | Admitting: Internal Medicine

## 2016-10-29 DIAGNOSIS — R569 Unspecified convulsions: Secondary | ICD-10-CM

## 2016-10-29 DIAGNOSIS — I739 Peripheral vascular disease, unspecified: Secondary | ICD-10-CM | POA: Diagnosis present

## 2016-10-29 DIAGNOSIS — E872 Acidosis: Secondary | ICD-10-CM | POA: Diagnosis present

## 2016-10-29 DIAGNOSIS — J449 Chronic obstructive pulmonary disease, unspecified: Secondary | ICD-10-CM | POA: Diagnosis present

## 2016-10-29 DIAGNOSIS — Z87891 Personal history of nicotine dependence: Secondary | ICD-10-CM

## 2016-10-29 DIAGNOSIS — E876 Hypokalemia: Secondary | ICD-10-CM | POA: Diagnosis present

## 2016-10-29 DIAGNOSIS — Z96641 Presence of right artificial hip joint: Secondary | ICD-10-CM | POA: Diagnosis present

## 2016-10-29 DIAGNOSIS — Z7951 Long term (current) use of inhaled steroids: Secondary | ICD-10-CM

## 2016-10-29 DIAGNOSIS — J0130 Acute sphenoidal sinusitis, unspecified: Secondary | ICD-10-CM | POA: Diagnosis present

## 2016-10-29 DIAGNOSIS — Z22322 Carrier or suspected carrier of Methicillin resistant Staphylococcus aureus: Secondary | ICD-10-CM

## 2016-10-29 DIAGNOSIS — Z7982 Long term (current) use of aspirin: Secondary | ICD-10-CM

## 2016-10-29 DIAGNOSIS — E871 Hypo-osmolality and hyponatremia: Secondary | ICD-10-CM | POA: Diagnosis present

## 2016-10-29 DIAGNOSIS — E785 Hyperlipidemia, unspecified: Secondary | ICD-10-CM | POA: Diagnosis present

## 2016-10-29 DIAGNOSIS — I480 Paroxysmal atrial fibrillation: Secondary | ICD-10-CM | POA: Diagnosis present

## 2016-10-29 DIAGNOSIS — I10 Essential (primary) hypertension: Secondary | ICD-10-CM | POA: Diagnosis present

## 2016-10-29 DIAGNOSIS — I160 Hypertensive urgency: Secondary | ICD-10-CM | POA: Diagnosis present

## 2016-10-29 DIAGNOSIS — I6783 Posterior reversible encephalopathy syndrome: Principal | ICD-10-CM | POA: Diagnosis present

## 2016-10-29 DIAGNOSIS — R739 Hyperglycemia, unspecified: Secondary | ICD-10-CM | POA: Diagnosis present

## 2016-10-29 DIAGNOSIS — R404 Transient alteration of awareness: Secondary | ICD-10-CM | POA: Diagnosis not present

## 2016-10-29 DIAGNOSIS — Z79899 Other long term (current) drug therapy: Secondary | ICD-10-CM

## 2016-10-29 DIAGNOSIS — Z8673 Personal history of transient ischemic attack (TIA), and cerebral infarction without residual deficits: Secondary | ICD-10-CM

## 2016-10-29 HISTORY — DX: Cerebral infarction, unspecified: I63.9

## 2016-10-30 ENCOUNTER — Emergency Department (HOSPITAL_COMMUNITY): Payer: Medicare Other

## 2016-10-30 ENCOUNTER — Inpatient Hospital Stay (HOSPITAL_COMMUNITY): Payer: Medicare Other

## 2016-10-30 ENCOUNTER — Encounter (HOSPITAL_COMMUNITY): Payer: Self-pay | Admitting: Emergency Medicine

## 2016-10-30 DIAGNOSIS — Z96641 Presence of right artificial hip joint: Secondary | ICD-10-CM | POA: Diagnosis present

## 2016-10-30 DIAGNOSIS — R404 Transient alteration of awareness: Secondary | ICD-10-CM | POA: Diagnosis present

## 2016-10-30 DIAGNOSIS — J0130 Acute sphenoidal sinusitis, unspecified: Secondary | ICD-10-CM | POA: Diagnosis present

## 2016-10-30 DIAGNOSIS — I480 Paroxysmal atrial fibrillation: Secondary | ICD-10-CM | POA: Diagnosis present

## 2016-10-30 DIAGNOSIS — I739 Peripheral vascular disease, unspecified: Secondary | ICD-10-CM | POA: Diagnosis present

## 2016-10-30 DIAGNOSIS — R569 Unspecified convulsions: Secondary | ICD-10-CM

## 2016-10-30 DIAGNOSIS — Z7982 Long term (current) use of aspirin: Secondary | ICD-10-CM | POA: Diagnosis not present

## 2016-10-30 DIAGNOSIS — Z22322 Carrier or suspected carrier of Methicillin resistant Staphylococcus aureus: Secondary | ICD-10-CM | POA: Diagnosis not present

## 2016-10-30 DIAGNOSIS — J449 Chronic obstructive pulmonary disease, unspecified: Secondary | ICD-10-CM | POA: Diagnosis present

## 2016-10-30 DIAGNOSIS — I160 Hypertensive urgency: Secondary | ICD-10-CM | POA: Diagnosis present

## 2016-10-30 DIAGNOSIS — I6783 Posterior reversible encephalopathy syndrome: Secondary | ICD-10-CM | POA: Diagnosis present

## 2016-10-30 DIAGNOSIS — R739 Hyperglycemia, unspecified: Secondary | ICD-10-CM | POA: Diagnosis present

## 2016-10-30 DIAGNOSIS — Z79899 Other long term (current) drug therapy: Secondary | ICD-10-CM | POA: Diagnosis not present

## 2016-10-30 DIAGNOSIS — Z8673 Personal history of transient ischemic attack (TIA), and cerebral infarction without residual deficits: Secondary | ICD-10-CM | POA: Diagnosis not present

## 2016-10-30 DIAGNOSIS — E785 Hyperlipidemia, unspecified: Secondary | ICD-10-CM | POA: Diagnosis present

## 2016-10-30 DIAGNOSIS — E872 Acidosis: Secondary | ICD-10-CM | POA: Diagnosis present

## 2016-10-30 DIAGNOSIS — E876 Hypokalemia: Secondary | ICD-10-CM | POA: Diagnosis present

## 2016-10-30 DIAGNOSIS — Z87891 Personal history of nicotine dependence: Secondary | ICD-10-CM | POA: Diagnosis not present

## 2016-10-30 DIAGNOSIS — I10 Essential (primary) hypertension: Secondary | ICD-10-CM | POA: Diagnosis present

## 2016-10-30 DIAGNOSIS — E871 Hypo-osmolality and hyponatremia: Secondary | ICD-10-CM | POA: Diagnosis present

## 2016-10-30 DIAGNOSIS — Z7951 Long term (current) use of inhaled steroids: Secondary | ICD-10-CM | POA: Diagnosis not present

## 2016-10-30 LAB — COMPREHENSIVE METABOLIC PANEL
ALT: 27 U/L (ref 14–54)
ALT: 35 U/L (ref 14–54)
AST: 32 U/L (ref 15–41)
AST: 54 U/L — ABNORMAL HIGH (ref 15–41)
Albumin: 3.5 g/dL (ref 3.5–5.0)
Albumin: 4.7 g/dL (ref 3.5–5.0)
Alkaline Phosphatase: 58 U/L (ref 38–126)
Alkaline Phosphatase: 87 U/L (ref 38–126)
Anion gap: 23 — ABNORMAL HIGH (ref 5–15)
Anion gap: 7 (ref 5–15)
BUN: 7 mg/dL (ref 6–20)
BUN: 8 mg/dL (ref 6–20)
CO2: 17 mmol/L — ABNORMAL LOW (ref 22–32)
CO2: 24 mmol/L (ref 22–32)
Calcium: 8.5 mg/dL — ABNORMAL LOW (ref 8.9–10.3)
Calcium: 9.7 mg/dL (ref 8.9–10.3)
Chloride: 89 mmol/L — ABNORMAL LOW (ref 101–111)
Chloride: 99 mmol/L — ABNORMAL LOW (ref 101–111)
Creatinine, Ser: 0.7 mg/dL (ref 0.44–1.00)
Creatinine, Ser: 1.05 mg/dL — ABNORMAL HIGH (ref 0.44–1.00)
GFR calc Af Amer: >60 mL/min (ref >60)
GFR calc Af Amer: >60 mL/min (ref >60)
GFR calc non Af Amer: 53 mL/min — ABNORMAL LOW (ref >60)
GFR calc non Af Amer: >60 mL/min (ref >60)
Glucose, Bld: 107 mg/dL — ABNORMAL HIGH (ref 65–99)
Glucose, Bld: 242 mg/dL — ABNORMAL HIGH (ref 65–99)
Potassium: 2.9 mmol/L — ABNORMAL LOW (ref 3.5–5.1)
Potassium: 3.7 mmol/L (ref 3.5–5.1)
Sodium: 129 mmol/L — ABNORMAL LOW (ref 135–145)
Sodium: 130 mmol/L — ABNORMAL LOW (ref 135–145)
Total Bilirubin: 0.9 mg/dL (ref 0.3–1.2)
Total Bilirubin: 1.2 mg/dL (ref 0.3–1.2)
Total Protein: 5.7 g/dL — ABNORMAL LOW (ref 6.5–8.1)
Total Protein: 8 g/dL (ref 6.5–8.1)

## 2016-10-30 LAB — RAPID URINE DRUG SCREEN, HOSP PERFORMED
Amphetamines: NOT DETECTED
Barbiturates: NOT DETECTED
Benzodiazepines: POSITIVE — AB
Cocaine: NOT DETECTED
Opiates: POSITIVE — AB
Tetrahydrocannabinol: NOT DETECTED

## 2016-10-30 LAB — I-STAT ARTERIAL BLOOD GAS, ED
Acid-base deficit: 9 mmol/L — ABNORMAL HIGH (ref 0.0–2.0)
Bicarbonate: 18.8 mmol/L — ABNORMAL LOW (ref 20.0–28.0)
O2 Saturation: 100 %
Patient temperature: 98
TCO2: 20 mmol/L (ref 0–100)
pCO2 arterial: 43.9 mmHg (ref 32.0–48.0)
pH, Arterial: 7.238 — ABNORMAL LOW (ref 7.350–7.450)
pO2, Arterial: 441 mmHg — ABNORMAL HIGH (ref 83.0–108.0)

## 2016-10-30 LAB — MAGNESIUM: Magnesium: 2 mg/dL (ref 1.7–2.4)

## 2016-10-30 LAB — CBC WITH DIFFERENTIAL/PLATELET
Basophils Absolute: 0 10*3/uL (ref 0.0–0.1)
Basophils Absolute: 0 10*3/uL (ref 0.0–0.1)
Basophils Relative: 0 %
Basophils Relative: 0 %
Eosinophils Absolute: 0 10*3/uL (ref 0.0–0.7)
Eosinophils Absolute: 0 10*3/uL (ref 0.0–0.7)
Eosinophils Relative: 0 %
Eosinophils Relative: 0 %
HCT: 38.5 % (ref 36.0–46.0)
HCT: 45.5 % (ref 36.0–46.0)
Hemoglobin: 13.9 g/dL (ref 12.0–15.0)
Hemoglobin: 16.2 g/dL — ABNORMAL HIGH (ref 12.0–15.0)
Lymphocytes Relative: 16 %
Lymphocytes Relative: 7 %
Lymphs Abs: 1.2 10*3/uL (ref 0.7–4.0)
Lymphs Abs: 2.4 10*3/uL (ref 0.7–4.0)
MCH: 31.8 pg (ref 26.0–34.0)
MCH: 32.3 pg (ref 26.0–34.0)
MCHC: 35.6 g/dL (ref 30.0–36.0)
MCHC: 36.1 g/dL — ABNORMAL HIGH (ref 30.0–36.0)
MCV: 88.1 fL (ref 78.0–100.0)
MCV: 90.8 fL (ref 78.0–100.0)
Monocytes Absolute: 0.6 10*3/uL (ref 0.1–1.0)
Monocytes Absolute: 1.1 10*3/uL — ABNORMAL HIGH (ref 0.1–1.0)
Monocytes Relative: 4 %
Monocytes Relative: 7 %
Neutro Abs: 12.2 10*3/uL — ABNORMAL HIGH (ref 1.7–7.7)
Neutro Abs: 13.4 10*3/uL — ABNORMAL HIGH (ref 1.7–7.7)
Neutrophils Relative %: 80 %
Neutrophils Relative %: 86 %
Platelets: 282 10*3/uL (ref 150–400)
Platelets: 392 10*3/uL (ref 150–400)
RBC: 4.37 MIL/uL (ref 3.87–5.11)
RBC: 5.01 MIL/uL (ref 3.87–5.11)
RDW: 11.9 % (ref 11.5–15.5)
RDW: 11.9 % (ref 11.5–15.5)
WBC: 15.3 10*3/uL — ABNORMAL HIGH (ref 4.0–10.5)
WBC: 15.7 10*3/uL — ABNORMAL HIGH (ref 4.0–10.5)

## 2016-10-30 LAB — URINALYSIS, ROUTINE W REFLEX MICROSCOPIC
Bacteria, UA: NONE SEEN
Bilirubin Urine: NEGATIVE
Glucose, UA: NEGATIVE mg/dL
Hgb urine dipstick: NEGATIVE
Ketones, ur: 20 mg/dL — AB
Leukocytes, UA: NEGATIVE
Nitrite: NEGATIVE
Protein, ur: 100 mg/dL — AB
Specific Gravity, Urine: 1.014 (ref 1.005–1.030)
Squamous Epithelial / LPF: NONE SEEN
pH: 6 (ref 5.0–8.0)

## 2016-10-30 LAB — I-STAT TROPONIN, ED: Troponin i, poc: 0 ng/mL (ref 0.00–0.08)

## 2016-10-30 LAB — I-STAT CG4 LACTIC ACID, ED: Lactic Acid, Venous: 14.85 mmol/L (ref 0.5–1.9)

## 2016-10-30 LAB — TROPONIN I: Troponin I: 0.03 ng/mL (ref <0.03)

## 2016-10-30 LAB — LACTIC ACID, PLASMA: Lactic Acid, Venous: 1 mmol/L (ref 0.5–1.9)

## 2016-10-30 LAB — CBG MONITORING, ED
Glucose-Capillary: 109 mg/dL — ABNORMAL HIGH (ref 65–99)
Glucose-Capillary: 101 mg/dL — ABNORMAL HIGH (ref 65–99)

## 2016-10-30 LAB — TSH: TSH: 0.728 u[IU]/mL (ref 0.350–4.500)

## 2016-10-30 MED ORDER — SODIUM CHLORIDE 0.9 % IV SOLN
INTRAVENOUS | Status: AC
  Administered 2016-10-30: 06:00:00 via INTRAVENOUS

## 2016-10-30 MED ORDER — SODIUM CHLORIDE 0.9 % IV SOLN: 0.5000 ug/kg/min | INTRAVENOUS | Status: DC

## 2016-10-30 MED ORDER — INSULIN ASPART 100 UNIT/ML ~~LOC~~ SOLN
0.0000 [IU] | SUBCUTANEOUS | Status: DC
  Administered 2016-11-01 – 2016-11-02 (×2): 1 [IU] via SUBCUTANEOUS
  Administered 2016-11-02 – 2016-11-03 (×2): 2 [IU] via SUBCUTANEOUS

## 2016-10-30 MED ORDER — LABETALOL HCL 5 MG/ML IV SOLN
10.0000 mg | INTRAVENOUS | Status: DC | PRN
  Administered 2016-10-30: 10 mg via INTRAVENOUS

## 2016-10-30 MED ORDER — LORAZEPAM 2 MG/ML IJ SOLN
1.0000 mg | Freq: Once | INTRAMUSCULAR | Status: AC
  Administered 2016-10-30: 1 mg via INTRAVENOUS
  Filled 2016-10-30: qty 1

## 2016-10-30 MED ORDER — ORAL CARE MOUTH RINSE
15.0000 mL | Freq: Two times a day (BID) | OROMUCOSAL | Status: DC
  Administered 2016-10-31 – 2016-11-01 (×4): 15 mL via OROMUCOSAL

## 2016-10-30 MED ORDER — PANTOPRAZOLE SODIUM 40 MG IV SOLR
40.0000 mg | INTRAVENOUS | Status: DC
  Administered 2016-10-30 – 2016-10-31 (×2): 40 mg via INTRAVENOUS
  Filled 2016-10-30 (×2): qty 40

## 2016-10-30 MED ORDER — POTASSIUM CHLORIDE 10 MEQ/100ML IV SOLN
10.0000 meq | INTRAVENOUS | Status: AC
  Administered 2016-10-30 (×3): 10 meq via INTRAVENOUS
  Filled 2016-10-30 (×3): qty 100

## 2016-10-30 MED ORDER — METOPROLOL TARTRATE 5 MG/5ML IV SOLN
5.0000 mg | Freq: Four times a day (QID) | INTRAVENOUS | Status: DC
  Administered 2016-10-30 (×2): 5 mg via INTRAVENOUS
  Filled 2016-10-30 (×2): qty 5

## 2016-10-30 MED ORDER — HYDRALAZINE HCL 20 MG/ML IJ SOLN
5.0000 mg | Freq: Once | INTRAMUSCULAR | Status: AC
  Administered 2016-10-30: 5 mg via INTRAVENOUS
  Filled 2016-10-30: qty 1

## 2016-10-30 MED ORDER — ARFORMOTEROL TARTRATE 15 MCG/2ML IN NEBU
15.0000 ug | INHALATION_SOLUTION | Freq: Two times a day (BID) | RESPIRATORY_TRACT | Status: DC
  Administered 2016-10-31 – 2016-11-03 (×7): 15 ug via RESPIRATORY_TRACT
  Filled 2016-10-30 (×9): qty 2

## 2016-10-30 MED ORDER — SODIUM CHLORIDE 0.9 % IV SOLN
500.0000 mg | Freq: Two times a day (BID) | INTRAVENOUS | Status: DC
  Administered 2016-10-30 – 2016-11-02 (×7): 500 mg via INTRAVENOUS
  Filled 2016-10-30 (×8): qty 5

## 2016-10-30 MED ORDER — ONDANSETRON HCL 4 MG/2ML IJ SOLN: 4.0000 mg | Freq: Four times a day (QID) | INTRAMUSCULAR | Status: DC | PRN

## 2016-10-30 MED ORDER — LABETALOL HCL 5 MG/ML IV SOLN
INTRAVENOUS | Status: AC
  Filled 2016-10-30: qty 4

## 2016-10-30 MED ORDER — HYDRALAZINE HCL 20 MG/ML IJ SOLN
10.0000 mg | INTRAMUSCULAR | Status: DC | PRN
  Administered 2016-10-30: 10 mg via INTRAVENOUS
  Filled 2016-10-30: qty 1

## 2016-10-30 MED ORDER — HYDRALAZINE HCL 20 MG/ML IJ SOLN
10.0000 mg | Freq: Three times a day (TID) | INTRAMUSCULAR | Status: DC
  Administered 2016-10-30: 10 mg via INTRAVENOUS
  Filled 2016-10-30: qty 1

## 2016-10-30 MED ORDER — LORAZEPAM 2 MG/ML IJ SOLN
1.0000 mg | INTRAMUSCULAR | Status: DC | PRN
  Administered 2016-10-30 – 2016-11-03 (×2): 1 mg via INTRAVENOUS
  Filled 2016-10-30 (×2): qty 1

## 2016-10-30 MED ORDER — CHLORHEXIDINE GLUCONATE 0.12 % MT SOLN
15.0000 mL | Freq: Two times a day (BID) | OROMUCOSAL | Status: DC
  Administered 2016-10-30 – 2016-11-03 (×8): 15 mL via OROMUCOSAL
  Filled 2016-10-30 (×6): qty 15

## 2016-10-30 MED ORDER — HYDRALAZINE HCL 20 MG/ML IJ SOLN: 10.0000 mg | INTRAMUSCULAR | Status: DC | PRN

## 2016-10-30 MED ORDER — SODIUM CHLORIDE 0.9 % IV BOLUS (SEPSIS): 500.0000 mL | Freq: Once | INTRAVENOUS | Status: DC

## 2016-10-30 MED ORDER — ONDANSETRON HCL 4 MG PO TABS: 4.0000 mg | ORAL_TABLET | Freq: Four times a day (QID) | ORAL | Status: DC | PRN

## 2016-10-30 MED ORDER — GADOBENATE DIMEGLUMINE 529 MG/ML IV SOLN
15.0000 mL | Freq: Once | INTRAVENOUS | Status: AC | PRN
  Administered 2016-10-30: 12 mL via INTRAVENOUS

## 2016-10-30 MED ORDER — BUDESONIDE 0.5 MG/2ML IN SUSP
0.5000 mg | Freq: Two times a day (BID) | RESPIRATORY_TRACT | Status: DC
  Administered 2016-10-31 – 2016-11-03 (×7): 0.5 mg via RESPIRATORY_TRACT
  Filled 2016-10-30 (×10): qty 2

## 2016-10-30 MED ORDER — SODIUM CHLORIDE 0.9 % IV BOLUS (SEPSIS)
2000.0000 mL | Freq: Once | INTRAVENOUS | Status: AC
  Administered 2016-10-30: 2000 mL via INTRAVENOUS

## 2016-10-30 MED ORDER — ACETAMINOPHEN 325 MG PO TABS
650.0000 mg | ORAL_TABLET | Freq: Four times a day (QID) | ORAL | Status: DC | PRN
  Administered 2016-10-31: 650 mg via ORAL
  Filled 2016-10-30: qty 2

## 2016-10-30 MED ORDER — ALBUTEROL SULFATE (2.5 MG/3ML) 0.083% IN NEBU
2.5000 mg | INHALATION_SOLUTION | Freq: Four times a day (QID) | RESPIRATORY_TRACT | Status: DC
  Administered 2016-10-31 (×2): 2.5 mg via RESPIRATORY_TRACT
  Filled 2016-10-30 (×3): qty 3

## 2016-10-30 MED ORDER — ACETAMINOPHEN 650 MG RE SUPP
650.0000 mg | Freq: Four times a day (QID) | RECTAL | Status: DC | PRN
  Administered 2016-10-31: 650 mg via RECTAL
  Filled 2016-10-30: qty 1

## 2016-10-30 MED ORDER — HALOPERIDOL LACTATE 5 MG/ML IJ SOLN
5.0000 mg | Freq: Once | INTRAMUSCULAR | Status: AC
  Administered 2016-10-30: 5 mg via INTRAVENOUS
  Filled 2016-10-30: qty 1

## 2016-10-30 MED ORDER — SODIUM CHLORIDE 0.9 % IV SOLN
1000.0000 mg | Freq: Once | INTRAVENOUS | Status: AC
  Administered 2016-10-30: 1000 mg via INTRAVENOUS
  Filled 2016-10-30: qty 10

## 2016-10-30 MED ORDER — NICARDIPINE HCL IN NACL 20-0.86 MG/200ML-% IV SOLN
3.0000 mg/h | INTRAVENOUS | Status: DC
  Administered 2016-10-30: 5 mg/h via INTRAVENOUS
  Administered 2016-10-31: 1 mg/h via INTRAVENOUS
  Filled 2016-10-30 (×3): qty 200

## 2016-10-30 MED ORDER — ENALAPRILAT 1.25 MG/ML IV SOLN
0.6250 mg | Freq: Four times a day (QID) | INTRAVENOUS | Status: DC
  Administered 2016-10-30 (×2): 0.625 mg via INTRAVENOUS
  Filled 2016-10-30 (×2): qty 1

## 2016-10-30 NOTE — Consult Note (Signed)
Neurology Consultation Reason for Consult: Seizures Referring Physician: Horton, C  CC: Seizures  History is obtained from: Patient, chart  HPI: Kim Blankenship is a 71 y.o. female with a history of A. fib, stroke who presents with new-onset seizures. Apparently she was found thrashing around at home and EMS was called. On arrival, they found her with small pupils. While EMS was transporting, she had a witnessed seizure that lasted about 1 minute and she was given Versed. Since that time, she has had a steadily improving mental status but is still confused.  Of note, there is concern for possible vaginal cancer, but this has not been diagnosed.  She was extremely hypertensive on arrival.   ROS: Unable to obtain due to altered mental status.   Past Medical History:  Diagnosis Date  . Asthma   . Atrial fibrillation ([place])   . COPD (chronic obstructive pulmonary disease) ([place])   . Hypertension   . Irregular heart rate   . Stroke [facility])      Family History  Problem Relation Age of Onset  . Dementia Father      Social History:  reports that she has quit smoking. Her smoking use included Cigarettes. She has never used smokeless tobacco. She reports that she drinks alcohol. She reports that she does not use drugs.   Exam: Current vital signs: BP (!) 156/135   Pulse (!) 103   Temp 98.9 F (37.2 C)   Resp 19   SpO2 95%  Vital signs in last 24 hours: Temp:  [98.9 F (37.2 C)-99.2 F (37.3 C)] 98.9 F (37.2 C) (08/01 0139) Pulse Rate:  [99-127] 103 (08/01 0115) Resp:  [17-28] 19 (08/01 0115) BP: (156-197)/(67-135) 156/135 (08/01 0115) SpO2:  [94 %-100 %] 95 % (08/01 0115)   Physical Exam  Constitutional: Appears well-developed and well-nourished.  Psych: Affect appropriate to situation Eyes: No scleral injection HENT: No OP obstrucion Head: Normocephalic.  Cardiovascular: Normal rate and regular rhythm.  Respiratory: Effort normal and breath sounds normal to anterior  ascultation GI: Soft.  No distension. There is no tenderness.  Skin: Multiple small scabs over multiple areas  Neuro: Mental Status: Patient is lethargic but does awaken to stimuli. She is able to tell me that she has by Jackelyn Poling, and follow some simple commands but does not answer much else. Cranial Nerves: II: Though she orients her eyes towards voice, she does not clearly fixate. Pupils are equal, round, and reactive to light.   III,IV, VI: Crosses midline in both directions. V: Facial sensation is symmetric to touch VII: Facial movement is symmetric.  VIII: hearing is intact to voice X: Uvula elevates symmetrically XI: Shoulder shrug is symmetric. XII: tongue is midline without atrophy or fasciculations.  Motor: She was all extremities equally, no clear focal weakness Sensory: She responds to noxious stimulation in all 4 extremities  Cerebellar: She does not perform  I have reviewed labs in epic and the results pertinent to this consultation are: Elevated lactate, WBC  I have reviewed the images obtained: CT head-hypodensity left parietal lobe  Impression: 71 year old female with new-onset seizures in the setting of severe hypertension. I think the finding on CT could represent a mass or PRES. She is not febrile, denies headache, and is improving so hold off on infectious evaluation for now. She will need further workup with EEG and MRI.  Recommendations: 1) Keppra 500 mg twice a day 2) MRI brain with/without contrast 3) EEG 4) neurology will continue to follow  Roland Rack, MD [facility] [phone]  If 7pm- 7am, please page neurology on call as listed in [place].

## 2016-10-30 NOTE — Progress Notes (Signed)
CRITICAL VALUE ALERT  Critical Value:  Troponin 0.03  Date & Time Notied:  10/30/16 @ 2035  Provider Notified: CCM- Dr. Vaughan Browner  Orders Received/Actions taken: No orders at this time.

## 2016-10-30 NOTE — ED Notes (Signed)
Patient becoming agitated again. Moaning. Cussing. Taking off nasal cannula. SBP elevated >200. Notified Dr. Wyline Copas. New orders received.

## 2016-10-30 NOTE — Progress Notes (Signed)
I saw the patient today around 11 am and again around 4 pm. She continued to remain hypertensive and agitated  despite PRN BP meds and Ativan PRN. Spoke to Dr Wyline Copas and said we needed to be agressive in BP management.  If patient BP was refractory, then it would be necessary to transfer to ICU for IV drip of Antihypertensive agent.  Was called later around 5 pm by Dr Wyline Copas who decided to transfer patient to ICU for nicardipine drip.

## 2016-10-30 NOTE — ED Triage Notes (Signed)
Pt arrives via EMS from home with new onset seizures, on EMS arrival pt had agonal respirations and pinpoint pupils. Estimated LSW 2200 10/29/16. Daughter called 911 after she heard pt thrashing around in room. Pt given 2mg  narcan and RR picked up pt began to have garbled speech and R sided gaze. Pt then had witnessed grand mal seizure lasting about 1 minute. EMS gave 2.5 MG versed, since that point pt have been unresponsive. NPA in place. Full code. 18G R AC and 20 G IV in R wrist.   Hx stroke last year during hip surgery. Has been taking tramadol for vaginal sores - PCP concerned for cancer.

## 2016-10-30 NOTE — ED Notes (Signed)
Patient transported via stretcher to EEG. 

## 2016-10-30 NOTE — ED Notes (Signed)
Pt's CBG 101.  Informed Horris Latino, RN.

## 2016-10-30 NOTE — Progress Notes (Signed)
PROGRESS NOTE    Kim Blankenship  NAT:[id number] DOB: [birth year] DOA: 10/29/2016 PCP: Briscoe Deutscher, MD    Brief Narrative:  71 y.o. female with history of COPD, hypertension, hyperlipidemia paroxysmal atrial fibrillation was brought to the ER patient had a seizure. As per the ER physician Carmell Austria was called at home of the patient was found to be confused. On arrival EMS found patient's pupils were pinpoint and Narcan was given. Necessary patient was witnessed tonic-clonic seizure which lasted for 1 minute and Versed 2.5 IV was given. En route to the ER patient had another seizure. As per the report patient was recently started on tramadol for vaginal sores.  Assessment & Plan:   Principal Problem:   Seizures ([place]) Active Problems:   COPD (chronic obstructive pulmonary disease) (HCC)   Seizure (HCC)   Hypertensive urgency   PAF (paroxysmal atrial fibrillation) ([place])   1. PRES with seizures 1. Patient presented with recurrent seizures and acute encephalopathy 2. MRI reviewed, no acute CVA, findings suggestive of PRES 3. Have started patient on multiple scheduled IV anti-hypertensives with some improvement 4. Neurology was consulted. Discussed case with Dr. Malen Gauze who recommends more aggressive BP control with cardene gtt 5. Discussed case with Critical Care who will place patient in ICU level of care 2. Hypertensive crisis 1. SBP into the 200 range 2. Despite starting scheduled IV hydralazine, IV beta blocker, IV ACEI, sbp remains in the 170-180's 3. Per above, discussed with Neurology, who recommended starting Cardene gtt. Will defer to PCCM 3. Acute toxic-metabolic encephalopathy 1. Remains confused and encephalopathic 2. Continue with aggressive bp control per above 4. Metabolic acidosis/lactic acidosis 1. Presenting lactic acid of just under 15 with repeat lactate of 1.0 2. Improved 5. COPD 1. No wheezing on exam 2. On minimal O2 support at present 6. P-Afib 1. Currently  rate controlled 2. Per above, have continued patient on scheduled IV beta blocker 7. HLD 1. Stable currently 2. Plan to resume Lipitor when mental status improves and patient can reliably tolerate po meds  DVT prophylaxis: SCD's Code Status: DNR Family Communication: Pt in room, family not at bedside Disposition Plan: Uncertain at this time  Consultants:   Neurology  PCCM  Procedures:     Antimicrobials: Anti-infectives    None       Subjective: Confused at this time, unable to assess  Objective: Vitals:   10/30/16 1500 10/30/16 1530 10/30/16 1630 10/30/16 1645  BP: (!) 187/77 (!) 187/72 (!) 179/82 (!) 172/83  Pulse: 93 (!) 105 87 88  Resp: (!) 31 (!) 29 (!) 26 (!) 28  Temp:      TempSrc:      SpO2: 96% 96% 93% 96%    Intake/Output Summary (Last 24 hours) at 10/30/16 1701 Last data filed at 10/30/16 1315  Gross per 24 hour  Intake             2305 ml  Output                0 ml  Net             2305 ml   There were no vitals filed for this visit.  Examination:  General exam: Appears aggitated, confused Respiratory system: Clear to auscultation. Respiratory effort normal. Cardiovascular system: S1 & S2 heard, RRR Gastrointestinal system: Abdomen is nondistended, soft and nontender. No organomegaly or masses felt. Normal bowel sounds heard. Central nervous system: Alert and oriented. No focal neurological deficits. Extremities: Symmetric 5 x 5  power. Skin: No rashes, lesions Psychiatry: Unable to assess given patient's encephalopahty  Data Reviewed: I have personally reviewed following labs and imaging studies  CBC:  Recent Labs Lab 10/30/16 0010 10/30/16 0455  WBC 15.3* 15.7*  NEUTROABS 12.2* 13.4*  HGB 16.2* 13.9  HCT 45.5 38.5  MCV 90.8 88.1  PLT 392 191   Basic Metabolic Panel:  Recent Labs Lab 10/30/16 0010 10/30/16 0455  NA 129* 130*  K 3.7 2.9*  CL 89* 99*  CO2 17* 24  GLUCOSE 242* 107*  BUN 8 7  CREATININE 1.05* 0.70    CALCIUM 9.7 8.5*   GFR: CrCl cannot be calculated (Unknown ideal weight.). Liver Function Tests:  Recent Labs Lab 10/30/16 0010 10/30/16 0455  AST 54* 32  ALT 35 27  ALKPHOS 87 58  BILITOT 1.2 0.9  PROT 8.0 5.7*  ALBUMIN 4.7 3.5   No results for input(s): LIPASE, AMYLASE in the last 168 hours. No results for input(s): AMMONIA in the last 168 hours. Coagulation Profile: No results for input(s): INR, PROTIME in the last 168 hours. Cardiac Enzymes: No results for input(s): CKTOTAL, CKMB, CKMBINDEX, TROPONINI in the last 168 hours. BNP (last 3 results) No results for input(s): PROBNP in the last 8760 hours. HbA1C: No results for input(s): HGBA1C in the last 72 hours. CBG:  Recent Labs Lab 10/30/16 0436 10/30/16 0828  GLUCAP 109* 101*   Lipid Profile: No results for input(s): CHOL, HDL, LDLCALC, TRIG, CHOLHDL, LDLDIRECT in the last 72 hours. Thyroid Function Tests:  Recent Labs  10/30/16 0455  TSH 0.728   Anemia Panel: No results for input(s): VITAMINB12, FOLATE, FERRITIN, TIBC, IRON, RETICCTPCT in the last 72 hours. Sepsis Labs:  Recent Labs Lab 10/30/16 0016 10/30/16 0455  LATICACIDVEN 14.85* 1.0    No results found for this or any previous visit (from the past 240 hour(s)).   Radiology Studies: Ct Head Wo Contrast  Result Date: 10/30/2016 CLINICAL DATA:  Initial evaluation for acute altered mental status, seizure. EXAM: CT HEAD WITHOUT CONTRAST TECHNIQUE: Contiguous axial images were obtained from the base of the skull through the vertex without intravenous contrast. COMPARISON:  Prior CT from 04/05/2016. FINDINGS: Brain: Cerebral volume within normal limits, and stable from previous. There is subtle patchy hypodensity within the parasagittal left parieto-occipital region (series 3, image 23), not definitely seen on previous. Additional possible vague hypodensity within the adjacent parasagittal right parieto-occipital lobe as well. Findings are nonspecific,  but could reflect sequelae of PRES given history of seizure. Underlying mass not excluded. Few additional scattered subcortical hypodensities involving the bilateral frontal lobes are grossly similar to previous, and may be related underlying chronic microvascular ischemic changes. No acute intracranial hemorrhage. No other evidence for acute large vessel territory infarct. No other mass lesion or midline shift. No hydrocephalus. No extra-axial fluid collection. Vascular: No hyperdense vessel. Scattered vascular calcifications noted within the carotid siphons. Skull: Scalp soft tissues and calvarium within normal limits. Sinuses/Orbits: Globes and orbital soft tissues within normal limits. Mild scattered mucosal thickening within the left sphenoid and maxillary sinuses as well the ethmoidal air cells. No mastoid effusion. IMPRESSION: Patchy hypodensity within the parasagittal left parietal lobe, new from prior. Given the history of seizure, finding may reflect sequelae of PRES. Underlying mass not entirely excluded. Further evaluation with brain MRI, with and without gadolinium, recommended. Findings discussed by telephone at the time of interpretation on 10/30/2016 at approximately 1:45 a.m. with Dr. Kathrynn Speed. Electronically Signed   By: Pincus Badder.D.  On: 10/30/2016 02:04   Mr Brain W And Wo Contrast  Result Date: 10/30/2016 CLINICAL DATA:  Initial evaluation for acute seizure. EXAM: MRI HEAD WITHOUT AND WITH CONTRAST TECHNIQUE: Multiplanar, multiecho pulse sequences of the brain and surrounding structures were obtained without and with intravenous contrast. CONTRAST:  27mL MULTIHANCE GADOBENATE DIMEGLUMINE 529 MG/ML IV SOLN COMPARISON:  Prior head CT from earlier the same day. FINDINGS: Brain: Cerebral volume within normal limits for age. Patchy T2/FLAIR hyperintensity involving the periventricular and deep white matter both cerebral hemispheres most consistent with chronic small vessel  ischemic disease, overall stable from previous. Multifocal patchy T2/FLAIR signal abnormality seen involving the cortical gray matter and underlying subcortical deep white matter of the bilateral parieto-occipital regions in a distribution most consistent with PRES. Associated mild patchy diffusion abnormality. No associated hemorrhage or abnormal enhancement. No other evidence for acute infarct. No mass lesion, midline shift or mass effect. No hydrocephalus. No extra-axial fluid collection. Major dural sinuses are patent. No other abnormal enhancement. Pituitary gland suprasellar region within normal limits. Vascular: Major intracranial vascular flow voids are maintained. Skull and upper cervical spine: Cerebellar tonsils mildly low lying within the foramen magnum without frank Chiari malformation. Mild degenerate spondylolysis noted within upper cervical spine without significant stenosis. Bone marrow signal intensity within normal limits. No scalp soft tissue abnormality. Sinuses/Orbits: Globes and orbital soft tissues within normal limits. Mild scattered mucosal thickening within the paranasal sinuses. Air-fluid level noted within the left sphenoid sinus. No significant mastoid effusion. Inner ear structures within normal limits. IMPRESSION: 1. Findings consistent with PRES as above. 2. Otherwise stable underlying chronic microvascular ischemic disease. 3. Acute left sphenoid sinusitis. Electronically Signed   By: Jeannine Boga M.D.   On: 10/30/2016 06:08   Dg Chest Portable 1 View  Result Date: 10/30/2016 CLINICAL DATA:  Altered mental status EXAM: PORTABLE CHEST 1 VIEW COMPARISON:  04/05/2016 FINDINGS: Shallow inspiration. Heart size and pulmonary vascularity are normal. Probable emphysematous changes in the lungs. No airspace disease or consolidation. No blunting of costophrenic angles. No pneumothorax. Calcified and tortuous aorta. IMPRESSION: Shallow inspiration. Probable emphysematous changes in  the lungs. Aortic atherosclerosis. No active pulmonary disease. Electronically Signed   By: Lucienne Capers M.D.   On: 10/30/2016 00:45    Scheduled Meds: . labetalol      . enalaprilat  0.625 mg Intravenous Q6H  . hydrALAZINE  10 mg Intravenous Q8H  . insulin aspart  0-9 Units Subcutaneous Q4H  . metoprolol tartrate  5 mg Intravenous Q6H   Continuous Infusions: . sodium chloride 50 mL/hr at 10/30/16 0909  . levETIRAcetam Stopped (10/30/16 0943)     LOS: 0 days   CHIU, Orpah Melter, MD [facility] Pager [phone]  If 7PM-7AM, please contact night-coverage [email or web address] Password [email or web address] 10/30/2016, 5:01 PM

## 2016-10-30 NOTE — H&P (Addendum)
History and Physical    Kim Blankenship OJJ:[id number] DOB: 21-Jun-1945 DOA: 10/29/2016  PCP: Briscoe Deutscher, MD  Patient coming from: Home.  History obtained from ER physician since patient is encephalopathic and no family at the bedside.  Chief Complaint: Seizures.  HPI: Kim Blankenship is a 71 y.o. female with history of COPD, hypertension, hyperlipidemia paroxysmal atrial fibrillation was brought to the ER patient had a seizure. As per the ER physician Carmell Austria was called at home of the patient was found to be confused. On arrival EMS found patient's pupils were pinpoint and Narcan was given. Necessary patient was witnessed tonic-clonic seizure which lasted for 1 minute and Versed 2.5 IV was given. En route to the ER patient had another seizure. As per the report patient was recently started on tramadol for vaginal sores.  ED Course: In the ER patient was found to have markedly elevated blood pressure and CT head shows right parietal hypodensity difference chills include PRES versus mass. Neurologist on call Dr. Leonel Ramsay was consulted. And patient was started on Keppra. Labs also revealed hyperglycemia with hyponatremia. On exam patient is still confused and oriented to her name only. Moves all extremities. Lactate level is markedly elevated probably from seizures. Patient denies afebrile.  Review of Systems: As per HPI, rest all negative.   Past Medical History:  Diagnosis Date  . Asthma   . Atrial fibrillation ([place])   . COPD (chronic obstructive pulmonary disease) ([place])   . Hypertension   . Irregular heart rate   . Stroke [facility])     Past Surgical History:  Procedure Laterality Date  . CESAREAN SECTION    . Left Foot Reconstruction    . OPEN REDUCTION INTERNAL FIXATION (ORIF) METACARPAL Right 03/10/2015   Procedure: OPEN REDUCTION INTERNAL FIXATION (ORIF) RIGHT LONG  METACARPAL FRACTURE;  Surgeon: Leanora Cover, MD;  Location: [facility];  Service: Orthopedics;   Laterality: Right;  . TONSILLECTOMY    . TOTAL HIP ARTHROPLASTY Right 02/09/2016   Procedure: TOTAL HIP ARTHROPLASTY ANTERIOR APPROACH;  Surgeon: Frederik Pear, MD;  Location: [facility];  Service: Orthopedics;  Laterality: Right;     reports that she has quit smoking. Her smoking use included Cigarettes. She has never used smokeless tobacco. She reports that she drinks alcohol. She reports that she does not use drugs.  No Known Allergies  Family History  Problem Relation Age of Onset  . Dementia Father     Prior to Admission medications   Medication Sig Start Date End Date Taking? Authorizing Provider  albuterol (PROVENTIL HFA;VENTOLIN HFA) 108 (90 BASE) MCG/ACT inhaler Inhale 2 puffs into the lungs every 4 (four) hours as needed for wheezing or shortness of breath.    [provider]  aspirin EC 325 MG tablet Take 1 tablet (325 mg total) by mouth 2 (two) times daily. 04/05/16   Isla Pence, MD  atorvastatin (LIPITOR) 20 MG tablet Take 20 mg by mouth daily. 04/08/16   [provider]  budesonide-formoterol (SYMBICORT) 160-4.5 MCG/ACT inhaler Inhale 2 puffs into the lungs 2 (two) times daily.    [provider]  diltiazem (TIAZAC) 300 MG 24 hr capsule Take 300 mg by mouth daily. 01/09/16   [provider]  ibuprofen (ADVIL,MOTRIN) 200 MG tablet Take 600-800 mg by mouth every 6 (six) hours as needed for headache or moderate pain.    [provider]  losartan-hydrochlorothiazide (HYZAAR) 100-25 MG tablet Take 1 tablet by mouth at bedtime.  01/09/16   [provider]  meclizine (ANTIVERT) 25 MG tablet Take 25 mg by mouth daily as needed (for vertigo).  12/12/15   [provider]  Multiple Vitamin (MULTIVITAMIN) tablet Take 1 tablet by mouth daily with breakfast.     [provider]  oxyCODONE-acetaminophen (ROXICET) 5-325 MG tablet Take 1 tablet by mouth every 4 (four) hours as needed. 02/09/16   Leighton Parody, PA-C    pramipexole (MIRAPEX) 0.25 MG tablet Take 0.25 mg by mouth at bedtime as needed (for restless legs).     [provider]    Physical Exam: Vitals:   10/30/16 0115 10/30/16 0139 10/30/16 0145 10/30/16 0200  BP: (!) 156/135  (!) 156/67 (!) 155/68  Pulse: (!) 103  93 83  Resp: 19  16 (!) 27  Temp:  98.9 F (37.2 C)    TempSrc:      SpO2: 95%  94% 93%      Constitutional: Moderately built and nourished. Vitals:   10/30/16 0115 10/30/16 0139 10/30/16 0145 10/30/16 0200  BP: (!) 156/135  (!) 156/67 (!) 155/68  Pulse: (!) 103  93 83  Resp: 19  16 (!) 27  Temp:  98.9 F (37.2 C)    TempSrc:      SpO2: 95%  94% 93%   Eyes: Anicteric no pallor. ENMT: No discharge from the ears eyes nose or mouth. Neck: No neck rigidity no mass felt. No JVD appreciated. Respiratory: No rhonchi or crepitations. Cardiovascular: S1-S2 heard no murmurs appreciated. Abdomen: Soft nontender bowel sounds present. Musculoskeletal: No edema. No rash. Skin: No rash. Skin appears warm. Neurologic: Patient is encephalopathic and oriented to name only moves all extremities. Pupils are reacting to light. Psychiatric: Patient is confused.   Labs on Admission: I have personally reviewed following labs and imaging studies  CBC:  Recent Labs Lab 10/30/16 0010  WBC 15.3*  NEUTROABS 12.2*  HGB 16.2*  HCT 45.5  MCV 90.8  PLT 626   Basic Metabolic Panel:  Recent Labs Lab 10/30/16 0010  NA 129*  K 3.7  CL 89*  CO2 17*  GLUCOSE 242*  BUN 8  CREATININE 1.05*  CALCIUM 9.7   GFR: CrCl cannot be calculated (Unknown ideal weight.). Liver Function Tests:  Recent Labs Lab 10/30/16 0010  AST 54*  ALT 35  ALKPHOS 87  BILITOT 1.2  PROT 8.0  ALBUMIN 4.7   No results for input(s): LIPASE, AMYLASE in the last 168 hours. No results for input(s): AMMONIA in the last 168 hours. Coagulation Profile: No results for input(s): INR, PROTIME in the last 168 hours. Cardiac Enzymes: No results  for input(s): CKTOTAL, CKMB, CKMBINDEX, TROPONINI in the last 168 hours. BNP (last 3 results) No results for input(s): PROBNP in the last 8760 hours. HbA1C: No results for input(s): HGBA1C in the last 72 hours. CBG: No results for input(s): GLUCAP in the last 168 hours. Lipid Profile: No results for input(s): CHOL, HDL, LDLCALC, TRIG, CHOLHDL, LDLDIRECT in the last 72 hours. Thyroid Function Tests: No results for input(s): TSH, T4TOTAL, FREET4, T3FREE, THYROIDAB in the last 72 hours. Anemia Panel: No results for input(s): VITAMINB12, FOLATE, FERRITIN, TIBC, IRON, RETICCTPCT in the last 72 hours. Urine analysis:    Component Value Date/Time   COLORURINE YELLOW 10/30/2016 0023   APPEARANCEUR CLEAR 10/30/2016 0023   LABSPEC 1.014 10/30/2016 0023   PHURINE 6.0 10/30/2016 0023   GLUCOSEU NEGATIVE 10/30/2016 0023   HGBUR NEGATIVE 10/30/2016 0023   BILIRUBINUR NEGATIVE 10/30/2016 0023   KETONESUR 20 (  A) 10/30/2016 0023   PROTEINUR 100 (A) 10/30/2016 0023   NITRITE NEGATIVE 10/30/2016 0023   LEUKOCYTESUR NEGATIVE 10/30/2016 0023   Sepsis Labs: @LABRCNTIP (procalcitonin:4,lacticidven:4) )No results found for this or any previous visit (from the past 240 hour(s)).   Radiological Exams on Admission: Ct Head Wo Contrast  Result Date: 10/30/2016 CLINICAL DATA:  Initial evaluation for acute altered mental status, seizure. EXAM: CT HEAD WITHOUT CONTRAST TECHNIQUE: Contiguous axial images were obtained from the base of the skull through the vertex without intravenous contrast. COMPARISON:  Prior CT from 04/05/2016. FINDINGS: Brain: Cerebral volume within normal limits, and stable from previous. There is subtle patchy hypodensity within the parasagittal left parieto-occipital region (series 3, image 23), not definitely seen on previous. Additional possible vague hypodensity within the adjacent parasagittal right parieto-occipital lobe as well. Findings are nonspecific, but could reflect sequelae of  PRES given history of seizure. Underlying mass not excluded. Few additional scattered subcortical hypodensities involving the bilateral frontal lobes are grossly similar to previous, and may be related underlying chronic microvascular ischemic changes. No acute intracranial hemorrhage. No other evidence for acute large vessel territory infarct. No other mass lesion or midline shift. No hydrocephalus. No extra-axial fluid collection. Vascular: No hyperdense vessel. Scattered vascular calcifications noted within the carotid siphons. Skull: Scalp soft tissues and calvarium within normal limits. Sinuses/Orbits: Globes and orbital soft tissues within normal limits. Mild scattered mucosal thickening within the left sphenoid and maxillary sinuses as well the ethmoidal air cells. No mastoid effusion. IMPRESSION: Patchy hypodensity within the parasagittal left parietal lobe, new from prior. Given the history of seizure, finding may reflect sequelae of PRES. Underlying mass not entirely excluded. Further evaluation with brain MRI, with and without gadolinium, recommended. Findings discussed by telephone at the time of interpretation on 10/30/2016 at approximately 1:45 a.m. with Dr. Kathrynn Speed. Electronically Signed   By: Jeannine Boga M.D.   On: 10/30/2016 02:04   Dg Chest Portable 1 View  Result Date: 10/30/2016 CLINICAL DATA:  Altered mental status EXAM: PORTABLE CHEST 1 VIEW COMPARISON:  04/05/2016 FINDINGS: Shallow inspiration. Heart size and pulmonary vascularity are normal. Probable emphysematous changes in the lungs. No airspace disease or consolidation. No blunting of costophrenic angles. No pneumothorax. Calcified and tortuous aorta. IMPRESSION: Shallow inspiration. Probable emphysematous changes in the lungs. Aortic atherosclerosis. No active pulmonary disease. Electronically Signed   By: Lucienne Capers M.D.   On: 10/30/2016 00:45    EKG: Independently reviewed. Sinus  tachycardia.  Assessment/Plan Principal Problem:   Seizures ([place]) Active Problems:   COPD (chronic obstructive pulmonary disease) (H[facility])   Seizure (HCC)   Hypertensive urgency   PAF (paroxysmal atrial fibrillation) ([place])    1. Seizures - appreciate neurology consult. Since CT head shows possible mass MRI brain with and without contrast will be ordered. EEG. Patient is on Keppra 500 IV every 12. When necessary Ativan. Discontinue tramadol. 2. Hypertensive urgency - for now we will keep patient on when necessary IV hydralazine and if MRI does not show stroke more aggressive blood pressure control. Patient may be restarted on Cardizem and ARB once more awake and alert. Hold off hydrochlorothiazide due to hyponatremia. 3. Metabolic acidosis likely from lactic acidosis from seizures. Patient did receive fluid bolus and will be receiving continuous hydration and follow lactate levels. Patient does not appear septic. 4. Hyperglycemia - check hemoglobin A1c. 5. COPD - not actively wheezing. 6. History of paroxysmal atrial fibrillation - now sinus tachycardic. Not sure why patient is not on anticoagulation. Patient  on Cardizem which can be continued once patient is more alert awake and can swallow. 7. Hyperlipidemia on statins.  I have reviewed patient's old charts and labs.   DVT prophylaxis: SCDs for now. Code Status: DO NOT RESUSCITATE as per the chart.  Family Communication: Unable to reach family.  Disposition Plan: Home.  Consults called: Neurology.  Admission status: Inpatient.    Rise Patience MD [facility] Pager [phone].  If 7PM-7AM, please contact night-coverage [email or web address] Password [email or web address]  10/30/2016, 4:15 AM

## 2016-10-30 NOTE — ED Notes (Signed)
Pt. given Ativan 1 mg IV for agitation/restlessness at MRI .

## 2016-10-30 NOTE — ED Notes (Signed)
Applied oxygen @ 2 LPM via nasal cannula d/t pulse ox 89-90% on room air. No resp distress noted.

## 2016-10-30 NOTE — Progress Notes (Signed)
Dr. Leonel Ramsay notified regarding patients BP management. Cardene off since 1930, BP steadily decreasing. Bolus ordered and maintenance fluids increased.

## 2016-10-30 NOTE — Procedures (Signed)
ELECTROENCEPHALOGRAM REPORT  Date of Study: 10/30/2016  Patient's Name: Kim Blankenship MRN: [id number] Date of Birth: 06/20/1945  Referring Provider: Roland Rack, MD  Clinical History: 71 year old female with history of stroke presents with new-onset seizure.  Medications: Hospital Medications: acetaminophen (TYLENOL)  enalaprilat (VASOTEC) injection 0.625 mg  hydrALAZINE (APRESOLINE) injection 10 mg  insulin aspart (novoLOG) injection 0-9 Units  levETIRAcetam (KEPPRA) 500 mg in sodium chloride 0.9 % 100 mL IVPB  metoprolol tartrate (LOPRESSOR) injection 5 mg  ondansetron (ZOFRAN) injection 4 mg  potassium chloride 10 mEq in 100 mL IVPB   Outpatient Medications: albuterol (PROVENTIL HFA;VENTOLIN HFA) 108 (90 BASE) MCG/ACT inhaler  aspirin EC 325 MG tablet  atorvastatin (LIPITOR) 20 MG tablet  budesonide-formoterol (SYMBICORT) 160-4.5 MCG/ACT inhaler  diltiazem (TIAZAC) 300 MG 24 hr capsule  ibuprofen (ADVIL,MOTRIN) 200 MG tablet  losartan-hydrochlorothiazide (HYZAAR) 100-25 MG tabletmeclizine (ANTIVERT) 25 MG tablet  Multiple Vitamin (MULTIVITAMIN) tablet  oxyCODONE-acetaminophen (ROXICET) 5-325 MG tablet  pramipexole (MIRAPEX) 0.25 MG tablet   Technical Summary: A multichannel digital EEG recording measured by the international 10-20 system with electrodes applied with paste and impedances below 5000 ohms performed as portable with EKG monitoring in an awake and drowsy patient.  Hyperventilation and photic stimulation were not performed.  The digital EEG was referentially recorded, reformatted, and digitally filtered in a variety of bipolar and referential montages for optimal display.   Description: The patient is awake and drowsy during the recording.  During maximal wakefulness, there is a symmetric 4-5 Hz theta and 2-3 Hz delta slowing of the waking background with no discernible posterior dominant rhythm.  There were no epileptiform discharges or electrographic  seizures seen.    EKG lead was unremarkable.  Impression: This awake and drowsy EEG is abnormal due to diffuse slowing of the waking background.  Clinical Correlation of the above findings indicates diffuse cerebral dysfunction that is non-specific in etiology and can be seen with hypoxic/ischemic injury, toxic/metabolic encephalopathies, neurodegenerative disorders, or medication effect.  The absence of epileptiform discharges does not rule out a clinical diagnosis of epilepsy.  Clinical correlation is advised.  Metta Clines, DO

## 2016-10-30 NOTE — ED Notes (Addendum)
Patient very agitated. Continues to moan. States she is hurting, but does not answer as to where or what is hurting her. Patient will not keep oxygen on. Yells "Get your hands off me." Will not allow RN to place oxygen on patient. Notified Dr. Earlie Counts. Received new orders.

## 2016-10-30 NOTE — ED Notes (Signed)
Patient transported to MRI 

## 2016-10-30 NOTE — ED Provider Notes (Signed)
[facility] DEPT Provider Note   CSN: [id number] Arrival date & time: 10/29/16  2359  By signing my name below, I, Ny'Kea Lewis, attest that this documentation has been prepared under the direction and in the presence of Jacqueleen Pulver, Barbette Hair, MD. Electronically Signed: Lise Auer, ED Scribe. 10/30/16. 12:17 AM.  History   Chief Complaint Chief Complaint  Patient presents with  . Seizures  . Altered Mental Status   LEVEL V CAVEAT: HPI and ROS limited due to acuity of condition.   The history is provided by the EMS personnel. The history is limited by the condition of the patient. No language interpreter was used.    HPI HPI Comments: Kim Blankenship is a 71 y.o. female with a history of COPD, HTN, asthma, and atrial fibrillation brought in by ambulance, who presents to the Emergency Department complaining of sudden onset, two episodes of seizures like activity PTA. Per EMS, pt's daughter called 911 after the pt was found "thrashing" around in the bathroom. When EMS arrived, the pt had pinpoint pupils and breathing 6/RR. Pt received 2 mg of Narcan and her respirations increased and she became responsive, but with garbled speech and right sided gaze. Pt's family told EMS she had a stroke one year prior while having hip surgery, with mild confusion but she has never had garbled speech. Pt then had a witnessed grand mal seizure that lasted for one minute. She received 2.5 mg of Versed, and since she has been unresponsive. 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   Past Medical History:  Diagnosis Date  . Asthma   . Atrial fibrillation ([place])   . COPD (chronic obstructive pulmonary disease) ([place])   . Hypertension   . Irregular heart rate   . Stroke [facility])    Patient Active Problem List   Diagnosis Date Noted  . Acute blood loss anemia   . Closed fracture of right hip ([place]) 02/09/2016  . Chest pain 11/07/2015    . COPD (chronic obstructive pulmonary disease) ([place]) 08/19/2013  . COPD exacerbation ([place]) 08/19/2013  . Abnormal EKG 08/19/2013  . Fever, unspecified 08/19/2013   Past Surgical History:  Procedure Laterality Date  . CESAREAN SECTION    . Left Foot Reconstruction    . OPEN REDUCTION INTERNAL FIXATION (ORIF) METACARPAL Right 03/10/2015   Procedure: OPEN REDUCTION INTERNAL FIXATION (ORIF) RIGHT LONG  METACARPAL FRACTURE;  Surgeon: Leanora Cover, MD;  Location: [facility];  Service: Orthopedics;  Laterality: Right;  . TONSILLECTOMY    . TOTAL HIP ARTHROPLASTY Right 02/09/2016   Procedure: TOTAL HIP ARTHROPLASTY ANTERIOR APPROACH;  Surgeon: Frederik Pear, MD;  Location: [facility];  Service: Orthopedics;  Laterality: Right;   OB History    No data available     Home Medications    Prior to Admission medications   Medication Sig Start Date End Date Taking? Authorizing Provider  albuterol (PROVENTIL HFA;VENTOLIN HFA) 108 (90 BASE) MCG/ACT inhaler Inhale 2 puffs into the lungs every 4 (four) hours as needed for wheezing or shortness of breath.    [provider]  aspirin EC 325 MG tablet Take 1 tablet (325 mg total) by mouth 2 (two) times daily. 04/05/16   Isla Pence, MD  atorvastatin (LIPITOR) 20 MG tablet Take 20 mg by mouth daily. 04/08/16   [provider]  budesonide-formoterol (SYMBICORT) 160-4.5 MCG/ACT inhaler Inhale 2 puffs into the lungs 2 (two) times daily.  [provider]  diltiazem (TIAZAC) 300 MG 24 hr capsule Take 300 mg by mouth daily. 01/09/16   [provider]  ibuprofen (ADVIL,MOTRIN) 200 MG tablet Take 600-800 mg by mouth every 6 (six) hours as needed for headache or moderate pain.    [provider]  losartan-hydrochlorothiazide (HYZAAR) 100-25 MG tablet Take 1 tablet by mouth at bedtime.  01/09/16   [provider]  meclizine (ANTIVERT) 25 MG tablet Take 25 mg by mouth daily as needed (for vertigo).   12/12/15   [provider]  Multiple Vitamin (MULTIVITAMIN) tablet Take 1 tablet by mouth daily with breakfast.     [provider]  oxyCODONE-acetaminophen (ROXICET) 5-325 MG tablet Take 1 tablet by mouth every 4 (four) hours as needed. 02/09/16   Leighton Parody, PA-C  pramipexole (MIRAPEX) 0.25 MG tablet Take 0.25 mg by mouth at bedtime as needed (for restless legs).     [provider]   Family History Family History  Problem Relation Age of Onset  . Dementia Father    Social History Social History  Substance Use Topics  . Smoking status: Former Smoker    Types: Cigarettes  . Smokeless tobacco: Never Used  . Alcohol use Yes     Comment: wine daily   Allergies   Patient has no known allergies.  Review of Systems Review of Systems  Unable to perform ROS: Patient unresponsive  Psychiatric/Behavioral: Behavioral problem:     Physical Exam Updated Vital Signs BP (!) 155/68   Pulse 83   Temp 98.9 F (37.2 C)   Resp (!) 27   SpO2 93%   Physical Exam  Constitutional:  Minimally responsive, spontaneous respirations at 15, airway tenuous  HENT:  Head: Normocephalic and atraumatic.  Bilateral nasal trumpet in place  Eyes: Pupils are equal, round, and reactive to light.  Pupils 2 mm reactive bilaterally  Cardiovascular: Regular rhythm and normal heart sounds.   Tachycardia  Pulmonary/Chest: Effort normal and breath sounds normal. No respiratory distress.  Abdominal: Soft. Bowel sounds are normal. There is no tenderness. There is no guarding.  Genitourinary:  Genitourinary Comments: Mass noted over right labia  Musculoskeletal: She exhibits no edema.  Neurological:  Somnolent, minimally arousable, occasionally withdrawals to pain, she occasionally has some rhythmic movement of her right upper extremity, no gaze preference noted  Skin: Skin is warm and dry.  Nursing note and vitals reviewed.   ED Treatments / Results  DIAGNOSTIC  STUDIES:   COORDINATION OF CARE: 2:37 AM-Discussed next steps with pt. Pt verbalized understanding and is agreeable with the plan.   Labs (all labs ordered are listed, but only abnormal results are displayed) Labs Reviewed  CBC WITH DIFFERENTIAL/PLATELET - Abnormal; Notable for the following:       Result Value   WBC 15.3 (*)    Hemoglobin 16.2 (*)    Neutro Abs 12.2 (*)    All other components within normal limits  COMPREHENSIVE METABOLIC PANEL - Abnormal; Notable for the following:    Sodium 129 (*)    Chloride 89 (*)    CO2 17 (*)    Glucose, Bld 242 (*)    Creatinine, Ser 1.05 (*)    AST 54 (*)    GFR calc non Af Amer 53 (*)    Anion gap 23 (*)    All other components within normal limits  RAPID URINE DRUG SCREEN, HOSP PERFORMED - Abnormal; Notable for the following:    Opiates POSITIVE (*)  Benzodiazepines POSITIVE (*)    All other components within normal limits  URINALYSIS, ROUTINE W REFLEX MICROSCOPIC - Abnormal; Notable for the following:    Ketones, ur 20 (*)    Protein, ur 100 (*)    All other components within normal limits  I-STAT CG4 LACTIC ACID, ED - Abnormal; Notable for the following:    Lactic Acid, Venous 14.85 (*)    All other components within normal limits  I-STAT ARTERIAL BLOOD GAS, ED - Abnormal; Notable for the following:    pH, Arterial 7.238 (*)    pO2, Arterial 441.0 (*)    Bicarbonate 18.8 (*)    Acid-base deficit 9.0 (*)    All other components within normal limits  I-STAT TROPONIN, ED  I-STAT CG4 LACTIC ACID, ED    EKG  EKG Interpretation  Date/Time:  Wednesday October 30 2016 00:04:37 EDT Ventricular Rate:  111 PR Interval:    QRS Duration: 99 QT Interval:  337 QTC Calculation: 458 R Axis:   91 Text Interpretation:  Sinus tachycardia Biatrial enlargement Right axis deviation Abnormal R-wave progression, late transition Borderline repolarization abnormality Baseline wander in lead(s) V3 V5 Faster when compared to prior Confirmed  by Thayer Jew [id number]) on 10/30/2016 12:40:49 AM       Radiology Ct Head Wo Contrast  Result Date: 10/30/2016 CLINICAL DATA:  Initial evaluation for acute altered mental status, seizure. EXAM: CT HEAD WITHOUT CONTRAST TECHNIQUE: Contiguous axial images were obtained from the base of the skull through the vertex without intravenous contrast. COMPARISON:  Prior CT from 04/05/2016. FINDINGS: Brain: Cerebral volume within normal limits, and stable from previous. There is subtle patchy hypodensity within the parasagittal left parieto-occipital region (series 3, image 23), not definitely seen on previous. Additional possible vague hypodensity within the adjacent parasagittal right parieto-occipital lobe as well. Findings are nonspecific, but could reflect sequelae of PRES given history of seizure. Underlying mass not excluded. Few additional scattered subcortical hypodensities involving the bilateral frontal lobes are grossly similar to previous, and may be related underlying chronic microvascular ischemic changes. No acute intracranial hemorrhage. No other evidence for acute large vessel territory infarct. No other mass lesion or midline shift. No hydrocephalus. No extra-axial fluid collection. Vascular: No hyperdense vessel. Scattered vascular calcifications noted within the carotid siphons. Skull: Scalp soft tissues and calvarium within normal limits. Sinuses/Orbits: Globes and orbital soft tissues within normal limits. Mild scattered mucosal thickening within the left sphenoid and maxillary sinuses as well the ethmoidal air cells. No mastoid effusion. IMPRESSION: Patchy hypodensity within the parasagittal left parietal lobe, new from prior. Given the history of seizure, finding may reflect sequelae of PRES. Underlying mass not entirely excluded. Further evaluation with brain MRI, with and without gadolinium, recommended. Findings discussed by telephone at the time of interpretation on 10/30/2016 at  approximately 1:45 a.m. with Dr. Kathrynn Speed. Electronically Signed   By: Jeannine Boga M.D.   On: 10/30/2016 02:04   Dg Chest Portable 1 View  Result Date: 10/30/2016 CLINICAL DATA:  Altered mental status EXAM: PORTABLE CHEST 1 VIEW COMPARISON:  04/05/2016 FINDINGS: Shallow inspiration. Heart size and pulmonary vascularity are normal. Probable emphysematous changes in the lungs. No airspace disease or consolidation. No blunting of costophrenic angles. No pneumothorax. Calcified and tortuous aorta. IMPRESSION: Shallow inspiration. Probable emphysematous changes in the lungs. Aortic atherosclerosis. No active pulmonary disease. Electronically Signed   By: Lucienne Capers M.D.   On: 10/30/2016 00:45    Procedures Procedures (including critical care time)  CRITICAL CARE Performed  by: Elinor Kleine F   Total critical care time: 40 minutes  Critical care time was exclusive of separately billable procedures and treating other patients.  Critical care was necessary to treat or prevent imminent or life-threatening deterioration.  Critical care was time spent personally by me on the following activities: development of treatment plan with patient and/or surrogate as well as nursing, discussions with consultants, evaluation of patient's response to treatment, examination of patient, obtaining history from patient or surrogate, ordering and performing treatments and interventions, ordering and review of laboratory studies, ordering and review of radiographic studies, pulse oximetry and re-evaluation of patient's condition.   Medications Ordered in ED Medications  levETIRAcetam (KEPPRA) 500 mg in sodium chloride 0.9 % 100 mL IVPB (not administered)  sodium chloride 0.9 % bolus 2,000 mL (0 mLs Intravenous Stopped 10/30/16 0136)  levETIRAcetam (KEPPRA) 1,000 mg in sodium chloride 0.9 % 100 mL IVPB (0 mg Intravenous Stopped 10/30/16 0055)  hydrALAZINE (APRESOLINE) injection 5 mg (5 mg  Intravenous Given 10/30/16 0040)     Initial Impression / Assessment and Plan / ED Course  I have reviewed the triage vital signs and the nursing notes.  Pertinent labs & imaging results that were available during my care of the patient were reviewed by me and considered in my medical decision making (see chart for details).     Patient presents with altered mental status. 2 witnessed seizures. No history of the same. She minimally responsive. ABCs are intact at this time but tenuous. She is notably hypertensive.  Lab work obtained. Lactate 14.0. At this time no signs or symptoms of sepsis. She is afebrile. Patient was given 2 L of fluid. Feel lactate may be related to recent seizure activity. She was loaded with a gram of Keppra. She was given 5 mg of hydralazine. On recheck, she is more awake but does not answer questions directly and does not follow commands. She appears encephalopathic versus post ictal. Evaluated by neurology. He agrees with concern for encephalopathy. Given hypertension, running diagnosis is PRES.  CT scan does show an element of hypodensity which could represent PRES.  Blood pressure now 155/68. She has not required titratable medications. She remains encephalopathic. She will need admission and close monitoring. Will admit to the step down unit. Discussed with the admitting hospitalist.    Final Clinical Impressions(s) / ED Diagnoses   Final diagnoses:  Transient alteration of awareness  Seizure ([place])  PRES (posterior reversible encephalopathy syndrome)   New Prescriptions New Prescriptions   No medications on file   I personally performed the services described in this documentation, which was scribed in my presence. The recorded information has been reviewed and is accurate.     Merryl Hacker, MD 10/30/16 [phone]

## 2016-10-30 NOTE — Consult Note (Signed)
PULMONARY / CRITICAL CARE MEDICINE   Name: Kim Blankenship MRN:   [id number] DOB:   1945-11-05           ADMISSION DATE:  10/29/2016 CONSULTATION DATE:  8/1  REFERRING MD:  Dr. Wyline Copas  CHIEF COMPLAINT:  Seizure, HTN  HISTORY OF PRESENT ILLNESS:  HPI obtained from medical chart review as patient is confused and no family at bedside. 71 year old female with PMH as below, which is significant for AF, COPD, HTN, and CVA. She had 2 seizure like periods on 7/31 and presented to [facility] ED.  Of note, patient reportedly taking tramadol for vaginal sores, possibly cancer, but undiagnosed.   Patient found thrashing around at home by daughter, LSW 2200 on 7/31.  Found agonal with pinpoint pupils by EMS, given narcan with change to garbled speech w/right gaze and increased respirations.  Then patient had witnessed tonic-clonic seizure w/ EMS and treated with versed.  Hypertensive with SBP 214 on arrival.  Neurology consulted and keppra started.  CT head with hypodensity concerning for PRES or underlying mass.  MRI findings consistent with PRES.  EEG with diffuse slowing.  Patient has remained confused with liable blood pressure with PRN meds.  Labs noted for neg troponin, lactate of 14 of which has now cleared, Na 129, K 3.7, sCr 1.05.   PCCM consulted for strict BP control in the ICU and better monitoring.    PAST MEDICAL HISTORY :  She  has a past medical history of Asthma; Atrial fibrillation ([place]); COPD (chronic obstructive pulmonary disease) ([place]); Hypertension; Irregular heart rate; and Stroke ([place]).  PAST SURGICAL HISTORY: She  has a past surgical history that includes Left Foot Reconstruction; Tonsillectomy; Cesarean section; Open reduction internal fixation (orif) metacarpal (Right, 03/10/2015); and Total hip arthroplasty (Right, 02/09/2016).  No Known Allergies  No current facility-administered medications on file prior to encounter.    Current Outpatient Prescriptions on File Prior to  Encounter  Medication Sig  . albuterol (PROVENTIL HFA;VENTOLIN HFA) 108 (90 BASE) MCG/ACT inhaler Inhale 2 puffs into the lungs every 4 (four) hours as needed for wheezing or shortness of breath.  Marland Kitchen aspirin EC 325 MG tablet Take 1 tablet (325 mg total) by mouth 2 (two) times daily.  Marland Kitchen atorvastatin (LIPITOR) 20 MG tablet Take 20 mg by mouth daily.  . budesonide-formoterol (SYMBICORT) 160-4.5 MCG/ACT inhaler Inhale 2 puffs into the lungs 2 (two) times daily.  Marland Kitchen diltiazem (TIAZAC) 300 MG 24 hr capsule Take 300 mg by mouth daily.  Marland Kitchen ibuprofen (ADVIL,MOTRIN) 200 MG tablet Take 600-800 mg by mouth every 6 (six) hours as needed for headache or moderate pain.  Marland Kitchen losartan-hydrochlorothiazide (HYZAAR) 100-25 MG tablet Take 1 tablet by mouth at bedtime.   . meclizine (ANTIVERT) 25 MG tablet Take 25 mg by mouth daily as needed (for vertigo).   . Multiple Vitamin (MULTIVITAMIN) tablet Take 1 tablet by mouth daily with breakfast.   . oxyCODONE-acetaminophen (ROXICET) 5-325 MG tablet Take 1 tablet by mouth every 4 (four) hours as needed.  . pramipexole (MIRAPEX) 0.25 MG tablet Take 0.25 mg by mouth at bedtime as needed (for restless legs).     FAMILY HISTORY:  Her indicated that her mother is deceased. She indicated that her father is deceased.    SOCIAL HISTORY: She  reports that she has quit smoking. Her smoking use included Cigarettes. She has never used smokeless tobacco. She reports that she drinks alcohol. She reports that she does not use drugs.  REVIEW OF  SYSTEMS:  Unable to assess.  SUBJECTIVE:   VITAL SIGNS: BP (!) 172/83   Pulse 88   Temp 98.9 F (37.2 C)   Resp (!) 28   SpO2 96%   HEMODYNAMICS:   VENTILATOR SETTINGS:   INTAKE / OUTPUT: I/O last 3 completed shifts: In: 2100 [IV Piggyback:2100] Out: -   PHYSICAL EXAMINATION: General:  Frail elderly female in NAD Neuro:  Lethargic, moaning, protecting airway HEENT:  [place]/AT, PERRL, no JVD Cardiovascular:  RRR, no MRG Lungs:   Clear bilatearl Abdomen:  Soft, non-tender, non-distended Musculoskeletal:  Atrophied, no acute deformity  Skin:  Grossly intact.   LABS:  BMET  Recent Labs Lab 10/30/16 0010 10/30/16 0455  NA 129* 130*  K 3.7 2.9*  CL 89* 99*  CO2 17* 24  BUN 8 7  CREATININE 1.05* 0.70  GLUCOSE 242* 107*    Electrolytes  Recent Labs Lab 10/30/16 0010 10/30/16 0455  CALCIUM 9.7 8.5*    CBC  Recent Labs Lab 10/30/16 0010 10/30/16 0455  WBC 15.3* 15.7*  HGB 16.2* 13.9  HCT 45.5 38.5  PLT 392 282    Coag's No results for input(s): APTT, INR in the last 168 hours.  Sepsis Markers  Recent Labs Lab 10/30/16 0016 10/30/16 0455  LATICACIDVEN 14.85* 1.0    ABG  Recent Labs Lab 10/30/16 0017  PHART 7.238*  PCO2ART 43.9  PO2ART 441.0*    Liver Enzymes  Recent Labs Lab 10/30/16 0010 10/30/16 0455  AST 54* 32  ALT 35 27  ALKPHOS 87 58  BILITOT 1.2 0.9  ALBUMIN 4.7 3.5    Cardiac Enzymes No results for input(s): TROPONINI, PROBNP in the last 168 hours.  Glucose  Recent Labs Lab 10/30/16 0436 10/30/16 0828  GLUCAP 109* 101*    Imaging Ct Head Wo Contrast  Result Date: 10/30/2016 CLINICAL DATA:  Initial evaluation for acute altered mental status, seizure. EXAM: CT HEAD WITHOUT CONTRAST TECHNIQUE: Contiguous axial images were obtained from the base of the skull through the vertex without intravenous contrast. COMPARISON:  Prior CT from 04/05/2016. FINDINGS: Brain: Cerebral volume within normal limits, and stable from previous. There is subtle patchy hypodensity within the parasagittal left parieto-occipital region (series 3, image 23), not definitely seen on previous. Additional possible vague hypodensity within the adjacent parasagittal right parieto-occipital lobe as well. Findings are nonspecific, but could reflect sequelae of PRES given history of seizure. Underlying mass not excluded. Few additional scattered subcortical hypodensities involving the  bilateral frontal lobes are grossly similar to previous, and may be related underlying chronic microvascular ischemic changes. No acute intracranial hemorrhage. No other evidence for acute large vessel territory infarct. No other mass lesion or midline shift. No hydrocephalus. No extra-axial fluid collection. Vascular: No hyperdense vessel. Scattered vascular calcifications noted within the carotid siphons. Skull: Scalp soft tissues and calvarium within normal limits. Sinuses/Orbits: Globes and orbital soft tissues within normal limits. Mild scattered mucosal thickening within the left sphenoid and maxillary sinuses as well the ethmoidal air cells. No mastoid effusion. IMPRESSION: Patchy hypodensity within the parasagittal left parietal lobe, new from prior. Given the history of seizure, finding may reflect sequelae of PRES. Underlying mass not entirely excluded. Further evaluation with brain MRI, with and without gadolinium, recommended. Findings discussed by telephone at the time of interpretation on 10/30/2016 at approximately 1:45 a.m. with Dr. Kathrynn Speed. Electronically Signed   By: Jeannine Boga M.D.   On: 10/30/2016 02:04   Mr Brain W And Wo Contrast  Result Date: 10/30/2016  CLINICAL DATA:  Initial evaluation for acute seizure. EXAM: MRI HEAD WITHOUT AND WITH CONTRAST TECHNIQUE: Multiplanar, multiecho pulse sequences of the brain and surrounding structures were obtained without and with intravenous contrast. CONTRAST:  58mL MULTIHANCE GADOBENATE DIMEGLUMINE 529 MG/ML IV SOLN COMPARISON:  Prior head CT from earlier the same day. FINDINGS: Brain: Cerebral volume within normal limits for age. Patchy T2/FLAIR hyperintensity involving the periventricular and deep white matter both cerebral hemispheres most consistent with chronic small vessel ischemic disease, overall stable from previous. Multifocal patchy T2/FLAIR signal abnormality seen involving the cortical gray matter and underlying  subcortical deep white matter of the bilateral parieto-occipital regions in a distribution most consistent with PRES. Associated mild patchy diffusion abnormality. No associated hemorrhage or abnormal enhancement. No other evidence for acute infarct. No mass lesion, midline shift or mass effect. No hydrocephalus. No extra-axial fluid collection. Major dural sinuses are patent. No other abnormal enhancement. Pituitary gland suprasellar region within normal limits. Vascular: Major intracranial vascular flow voids are maintained. Skull and upper cervical spine: Cerebellar tonsils mildly low lying within the foramen magnum without frank Chiari malformation. Mild degenerate spondylolysis noted within upper cervical spine without significant stenosis. Bone marrow signal intensity within normal limits. No scalp soft tissue abnormality. Sinuses/Orbits: Globes and orbital soft tissues within normal limits. Mild scattered mucosal thickening within the paranasal sinuses. Air-fluid level noted within the left sphenoid sinus. No significant mastoid effusion. Inner ear structures within normal limits. IMPRESSION: 1. Findings consistent with PRES as above. 2. Otherwise stable underlying chronic microvascular ischemic disease. 3. Acute left sphenoid sinusitis. Electronically Signed   By: Jeannine Boga M.D.   On: 10/30/2016 06:08   Dg Chest Portable 1 View  Result Date: 10/30/2016 CLINICAL DATA:  Altered mental status EXAM: PORTABLE CHEST 1 VIEW COMPARISON:  04/05/2016 FINDINGS: Shallow inspiration. Heart size and pulmonary vascularity are normal. Probable emphysematous changes in the lungs. No airspace disease or consolidation. No blunting of costophrenic angles. No pneumothorax. Calcified and tortuous aorta. IMPRESSION: Shallow inspiration. Probable emphysematous changes in the lungs. Aortic atherosclerosis. No active pulmonary disease. Electronically Signed   By: Lucienne Capers M.D.   On: 10/30/2016 00:45     STUDIES:  10/30/16 CT head >> new patchy hypodensity w/in parasagittal left parietal lobe; underlying mass not excluded 10/30/16>> findings consistent w/ PRES- multifocal patchy T2 flair signal abnormality seen involving the cortical gray matter and underlying subcortical deep white matter of the bilateral parieto-occipital regions; otherwise stable underlying chronic microvascular ischemic disease; acute left sphenoid sinusitis  CULTURES: none  ANTIBIOTICS: none  SIGNIFICANT EVENTS: 7/31 Admit  LINES/TUBES: PIV  DISCUSSION: 87 yoF admitted with new onset seizure and HTN urgency c/w PRES.   ASSESSMENT / PLAN:  PULMONARY A: COPD without acute exacerbation P:   O2 as needed for sats > 92% brovanna and budesonide BID, albuterol prn Pulmonary hygiene  CARDIOVASCULAR A:  HTN urgency Hx HLD, PAF - currently ST, rate controlled P:  Tele monitoring Cardene for strict goal SBP 170-190 for 24 hours Hold home lipitor, diltiazem, asa, losartan-HCTZ D/c vasotec, apresoline, labetalol EKG in am, trend troponin x 2  RENAL A:   Hyponatremia Hypokalemia Lactate acidosis- likely r/t to seizures cleared quickly P:   Check mag S/p KCL replete Free water restriction NS at 50 ml/hr Trend BMP / urinary output Replace electrolytes as indicated Avoid nephrotoxic agents, ensure adequate renal perfusion  GASTROINTESTINAL A:   No acute issues P:   NPO for now with AMS Protonix for SUP  HEMATOLOGIC A:  Leukocytosis- likely reactive Concern for ?vaginal cancer P:  Trend CBC SCDs for now  INFECTIOUS A:   Leukocytosis -afebrile, UA neg, CXR neg P:   Monitor clinically Follow fever curve, trend WBC  ENDOCRINE A:   hyperglycemia  - TSH 0.728 P:   CBG q 4 SSI HA1c pending  NEUROLOGIC A:   PRES w/new onset seizures Acute toxic/metabolic encephalopathy- responded to narcan + PRES - EEG with diffuse slowing; no epileptiform discharges - MRI c/w PRES - UDS +  benzo/opiates Hx of prior CVA P:   Appreciate Neurology recs Strict BP control x 24 hrs as above Continue Keppra Seizure precautions Ativan prn seizures Frequent neuro assessments SLP when able Hold home roxicet, mirapex, meclizine   FAMILY  - Updates: No family at bedside.  Per prior records, patient is DNR.   - Inter-disciplinary family meet or Palliative Care meeting due by:  11/06/16  CCT 45 mins  Kennieth Rad, ACNP Pulmonary and [place] Pager: [phone]  10/30/2016, 5:07 PM  STAFF NOTE: I, Merrie Roof, MD FACP have personally reviewed patient's available data, including medical history, events of note, physical examination and test results as part of my evaluation. I have discussed with resident/NP and other care providers such as pharmacist, RN and RRT. In addition, I personally evaluated patient and elicited key findings of: awakens, attempts to talk, per 13mm, not fc, NO distres, jvd down, lungs clear, abdo soft, no abnormal movements currently,  CT head I reviewed showed abnormal areas conerning for mass vs press, MRI brain I reviewed shows findgins c/w PRESS wihtout mass, she presented with HTN and Seziures and neuro consult c/w PRESS, she has been in ED all day and they continued to have limited success with HTN control, goal is reduction MAP 25% to sys 170 but would avoid hypotension MAP less 75if able, we can continued PRN hydral but has not had success, current sys is 150 is is post ictal, keprra to maintain, eeg done and reviewed by me, would use nicardipine to control sys BP if BP rises now, admit to icu, Na noted, get serum osm, urine na, osm, avoid any free water, supp Avoid any further haldol with seizure present, no family in room, admit to Neuro icu The patient is critically ill with multiple organ systems failure and requires high complexity decision making for assessment and support, frequent evaluation and titration of  therapies, application of advanced monitoring technologies and extensive interpretation of multiple databases.   Critical Care Time devoted to patient care services described in this note is 30 Minutes. This time reflects time of care of this signee: Merrie Roof, MD FACP. This critical care time does not reflect procedure time, or teaching time or supervisory time of PA/NP/Med student/Med Resident etc but could involve care discussion time. Rest per NP/medical resident whose note is outlined above and that I agree with   Lavon Paganini. Titus Mould, MD, [place] Pgr: [facility] [facility] 10/30/2016 9:19 PM

## 2016-10-30 NOTE — Progress Notes (Signed)
EEG Completed; Results Pending  

## 2016-10-31 ENCOUNTER — Inpatient Hospital Stay (HOSPITAL_COMMUNITY): Payer: Medicare Other

## 2016-10-31 DIAGNOSIS — I6783 Posterior reversible encephalopathy syndrome: Principal | ICD-10-CM

## 2016-10-31 DIAGNOSIS — I160 Hypertensive urgency: Secondary | ICD-10-CM

## 2016-10-31 LAB — CBC
HCT: 43.3 % (ref 36.0–46.0)
Hemoglobin: 15.2 g/dL — ABNORMAL HIGH (ref 12.0–15.0)
MCH: 32.1 pg (ref 26.0–34.0)
MCHC: 35.1 g/dL (ref 30.0–36.0)
MCV: 91.4 fL (ref 78.0–100.0)
Platelets: 243 10*3/uL (ref 150–400)
RBC: 4.74 MIL/uL (ref 3.87–5.11)
RDW: 12.2 % (ref 11.5–15.5)
WBC: 10.8 10*3/uL — ABNORMAL HIGH (ref 4.0–10.5)

## 2016-10-31 LAB — RENAL FUNCTION PANEL
Albumin: 3.2 g/dL — ABNORMAL LOW (ref 3.5–5.0)
Anion gap: 10 (ref 5–15)
BUN: 6 mg/dL (ref 6–20)
CO2: 21 mmol/L — ABNORMAL LOW (ref 22–32)
Calcium: 8.6 mg/dL — ABNORMAL LOW (ref 8.9–10.3)
Chloride: 108 mmol/L (ref 101–111)
Creatinine, Ser: 0.72 mg/dL (ref 0.44–1.00)
GFR calc Af Amer: >60 mL/min (ref >60)
GFR calc non Af Amer: >60 mL/min (ref >60)
Glucose, Bld: 94 mg/dL (ref 65–99)
Phosphorus: 2.8 mg/dL (ref 2.5–4.6)
Potassium: 3.4 mmol/L — ABNORMAL LOW (ref 3.5–5.1)
Sodium: 139 mmol/L (ref 135–145)

## 2016-10-31 LAB — TROPONIN I
Troponin I: 0.04 ng/mL (ref <0.03)
Troponin I: 0.04 ng/mL (ref <0.03)

## 2016-10-31 LAB — HEMOGLOBIN A1C
Hgb A1c MFr Bld: 5.4 % (ref 4.8–5.6)
Mean Plasma Glucose: 108 mg/dL

## 2016-10-31 LAB — GLUCOSE, CAPILLARY
Glucose-Capillary: 109 mg/dL — ABNORMAL HIGH (ref 65–99)
Glucose-Capillary: 110 mg/dL — ABNORMAL HIGH (ref 65–99)
Glucose-Capillary: 95 mg/dL (ref 65–99)
Glucose-Capillary: 126 mg/dL — ABNORMAL HIGH (ref 65–99)
Glucose-Capillary: 98 mg/dL (ref 65–99)
Glucose-Capillary: 110 mg/dL — ABNORMAL HIGH (ref 65–99)
Glucose-Capillary: 114 mg/dL — ABNORMAL HIGH (ref 65–99)

## 2016-10-31 LAB — MRSA PCR SCREENING: MRSA by PCR: POSITIVE — AB

## 2016-10-31 LAB — MAGNESIUM: Magnesium: 1.8 mg/dL (ref 1.7–2.4)

## 2016-10-31 MED ORDER — MUPIROCIN 2 % EX OINT
1.0000 "application " | TOPICAL_OINTMENT | Freq: Two times a day (BID) | CUTANEOUS | Status: DC
  Administered 2016-10-31 – 2016-11-03 (×7): 1 via NASAL
  Filled 2016-10-31 (×2): qty 22

## 2016-10-31 MED ORDER — MAGNESIUM SULFATE 2 GM/50ML IV SOLN
2.0000 g | Freq: Once | INTRAVENOUS | Status: AC
  Administered 2016-10-31: 2 g via INTRAVENOUS
  Filled 2016-10-31: qty 50

## 2016-10-31 MED ORDER — SODIUM CHLORIDE 0.9 % IV SOLN: INTRAVENOUS | Status: DC

## 2016-10-31 MED ORDER — CHLORHEXIDINE GLUCONATE CLOTH 2 % EX PADS: 6.0000 | MEDICATED_PAD | Freq: Every day | CUTANEOUS | Status: DC

## 2016-10-31 MED ORDER — CHLORHEXIDINE GLUCONATE CLOTH 2 % EX PADS
6.0000 | MEDICATED_PAD | Freq: Every day | CUTANEOUS | Status: DC
  Administered 2016-10-31 – 2016-11-02 (×3): 6 via TOPICAL

## 2016-10-31 MED ORDER — ALBUTEROL SULFATE (2.5 MG/3ML) 0.083% IN NEBU: 2.5000 mg | INHALATION_SOLUTION | RESPIRATORY_TRACT | Status: DC | PRN

## 2016-10-31 NOTE — Progress Notes (Signed)
S: Patient has clinically improved, alert and partially oriented. Answers most questions and follows commands.    Exam: Vitals:   10/31/16 1230 10/31/16 1300  BP: (!) 160/133 (!) 146/75  Pulse: 90 80  Resp: 18 19  Temp:     Gen: In bed, NAD Resp: non-labored breathing, no acute distress Abd: soft, nt  Neuro: HT:XHFSF and oriented to place, person. Normal speech and follows commands consistently. CN: PERRLA, VF: Finger counts in both eyes, recognizes object and colors. Difficult to test individual qaudrants accurately as she confabulates. Motor: 5/5 in all 4 extremities, normal tone Sensory: intact bilaterally  DTR:2+ patellar bilaterally, neg Babinskis in both feet.   ASSESSMENT AND PLAN  PRES Seizures HTN urgency  MRI Head consistent with PRES, no acute strokes EEG done today - results pending Continue aggressive BP management, goal less than 423 mm HG systolic Continue Keppra, ativan PRN  Seizure precuations    Karena Addison Phoenix Dresser MD [facility] [phone]  If 7pm to 7am, please call on call as listed on AMION.

## 2016-10-31 NOTE — Care Management Note (Addendum)
Case Management Note  Patient Details  Name: Kim Blankenship MRN: [id number] Date of Birth: Oct 30, 1945  Subjective/Objective:   Pt admitted on 10/30/16 seizures, HTN urgency, and metabolic acidosis.  PTA, pt resided at home with her daughter, [name].                   Action/Plan: Recommend PT/OT consults when pt able to tolerate therapies.  Will follow for discharge planning as pt progresses.   Expected Discharge Date:                  Expected Discharge Plan:     In-House Referral:     Discharge planning Services  CM Consult  Post Acute Care Choice:    Choice offered to:     DME Arranged:    DME Agency:     HH Arranged:    HH Agency:     Status of Service:  In process, will continue to follow  If discussed at Long Length of Stay Meetings, dates discussed:    Additional Comments:  Reinaldo Raddle, RN, BSN  Trauma/Neuro ICU Case Manager [phone]

## 2016-10-31 NOTE — Progress Notes (Signed)
eLink Physician-Brief Progress Note Patient Name: Kim Blankenship DOB: 04/22/1945 MRN: [id number]   Date of Service  10/31/2016  HPI/Events of Note  Spoke with the patient's daughter [name] via phone. She informed me that her mother should be full CODE STATUS.   eICU Interventions  1. Order placed to change CODE STATUS to full code 2. Requested patient's daughter be at bedside tomorrow morning and discuss further with rounding physicians      Intervention Category Major Interventions: Other:  Tera Partridge 10/31/2016, 7:43 PM

## 2016-10-31 NOTE — Evaluation (Signed)
Clinical/Bedside Swallow Evaluation Patient Details  Name: Kim Blankenship MRN: [id number] Date of Birth: 1945/07/12  Today's Date: 10/31/2016 Time: SLP Start Time (ACUTE ONLY): 39 SLP Stop Time (ACUTE ONLY): 1058 SLP Time Calculation (min) (ACUTE ONLY): 18 min  Past Medical History:  Past Medical History:  Diagnosis Date  . Asthma   . Atrial fibrillation ([place])   . COPD (chronic obstructive pulmonary disease) ([place])   . Hypertension   . Irregular heart rate   . Stroke [facility])    Past Surgical History:  Past Surgical History:  Procedure Laterality Date  . CESAREAN SECTION    . Left Foot Reconstruction    . OPEN REDUCTION INTERNAL FIXATION (ORIF) METACARPAL Right 03/10/2015   Procedure: OPEN REDUCTION INTERNAL FIXATION (ORIF) RIGHT LONG  METACARPAL FRACTURE;  Surgeon: Leanora Cover, MD;  Location: [facility];  Service: Orthopedics;  Laterality: Right;  . TONSILLECTOMY    . TOTAL HIP ARTHROPLASTY Right 02/09/2016   Procedure: TOTAL HIP ARTHROPLASTY ANTERIOR APPROACH;  Surgeon: Frederik Pear, MD;  Location: [facility];  Service: Orthopedics;  Laterality: Right;   HPI:  Pt is a 71 y/o F admitted with new onset seizure and HTN urgency. MRI suggestive of PRES. PMH includes a fib, COPD, HTN, CVA, and concern for possible vaginal cancer (not diagnosed).   Assessment / Plan / Recommendation Clinical Impression  Pt is disoriented and distractible but consumes solid POs without overt signs of dysphagia. She did have one cough after a straw sip of thin liquid, which happened to be when she was having difficulty using her straw. Suspect an episodic event of premature spillage. Given her current mentation, recommend Dys 3 diet and thin liquids with full supervision. I anticipate her returning to regular textures as soon as her mentation begins to clear.  SLP Visit Diagnosis: Dysphagia, unspecified (R13.10)    Aspiration Risk  Mild aspiration risk    Diet Recommendation Dysphagia 3 (Mech  soft);Thin liquid   Liquid Administration via: Cup;Straw Medication Administration: Whole meds with liquid Supervision: Patient able to self feed;Full supervision/cueing for compensatory strategies Compensations: Slow rate;Small sips/bites;Minimize environmental distractions Postural Changes: Seated upright at 90 degrees    Other  Recommendations Oral Care Recommendations: Oral care BID   Follow up Recommendations  (tba)      Frequency and Duration min 2x/week  2 weeks       Prognosis Prognosis for Safe Diet Advancement: Good Barriers to Reach Goals: Cognitive deficits      Swallow Study   General HPI: Pt is a 71 y/o F admitted with new onset seizure and HTN urgency. MRI suggestive of PRES. PMH includes a fib, COPD, HTN, CVA, and concern for possible vaginal cancer (not diagnosed). Type of Study: Bedside Swallow Evaluation Previous Swallow Assessment: none in chart Diet Prior to this Study: NPO Temperature Spikes Noted: No Respiratory Status: Room air History of Recent Intubation: No Behavior/Cognition: Alert;Cooperative;Pleasant mood;Confused;Distractible;Requires cueing Oral Cavity Assessment: Within Functional Limits Oral Care Completed by SLP: No Oral Cavity - Dentition: Adequate natural dentition Vision: Functional for self-feeding Self-Feeding Abilities: Able to feed self;Needs assist Patient Positioning: Upright in bed Baseline Vocal Quality: Normal    Oral/Motor/Sensory Function Overall Oral Motor/Sensory Function: Within functional limits   Ice Chips Ice chips: Not tested   Thin Liquid Thin Liquid: Impaired Presentation: Cup;Self Fed;Straw Pharyngeal  Phase Impairments: Cough - Immediate (x1)    Nectar Thick Nectar Thick Liquid: Not tested   Honey Thick Honey Thick Liquid: Not tested   Puree Puree: Within  functional limits Presentation: Self Fed;Spoon   Solid   GO   Solid: Within functional limits Presentation: Self [place] 10/31/2016,11:07 AM  Germain Osgood, M.A. CCC-SLP [phone]

## 2016-10-31 NOTE — Progress Notes (Signed)
Unable to reach pts daughter [name] on home or cell number. Also unable to reach pts sister [name] on her home number. Will update family if they call or visit.

## 2016-10-31 NOTE — Plan of Care (Signed)
Problem: Skin Integrity: Goal: Risk for impaired skin integrity will decrease Outcome: Progressing Pt able to turn self and maintaining foley catheter in place for healing of lesions.

## 2016-10-31 NOTE — Progress Notes (Signed)
PULMONARY / CRITICAL CARE MEDICINE   Name: Kim Blankenship MRN:   [id number] DOB:   06/17/1945           ADMISSION DATE:  10/29/2016 CONSULTATION DATE:  8/1  REFERRING MD:  Dr. Wyline Copas  CHIEF COMPLAINT:  Seizure, HTN brief 71 year old female with PMH as below, which is significant for AF, COPD, HTN, and CVA. She had 2 seizure like periods on 7/31 and presented to [facility] ED.  Of note, patient reportedly taking tramadol for vaginal sores, possibly cancer, but undiagnosed.   Patient found thrashing around at home by daughter, LSW 2200 on 7/31.  Found agonal with pinpoint pupils by EMS, given narcan with change to garbled speech w/right gaze and increased respirations.  Then patient had witnessed tonic-clonic seizure w/ EMS and treated with versed.  Hypertensive with SBP 214 on arrival.  Neurology consulted and keppra started.  CT head with hypodensity concerning for PRES or underlying mass.  MRI findings consistent with PRES.  EEG with diffuse slowing.  Patient has remained confused with liable blood pressure with PRN meds.  Labs noted for neg troponin, lactate of 14 of which has now cleared, Na 129, K 3.7, sCr 1.05.   PCCM consulted for strict BP control in the ICU and better monitoring.     STUDIES:  10/30/16 CT head >> new patchy hypodensity w/in parasagittal left parietal lobe; underlying mass not excluded 10/30/16>> findings consistent w/ PRES- multifocal patchy T2 flair signal abnormality seen involving the cortical gray matter and underlying subcortical deep white matter of the bilateral parieto-occipital regions; otherwise stable underlying chronic microvascular ischemic disease; acute left sphenoid sinusitis  CULTURES: none  ANTIBIOTICS: none  SIGNIFICANT EVENTS: 7/31 Admit 8/1 - CCM consult  LINES/TUBES: PIV   SUBJECTIVE/OVERNIGHT/INTERVAL HX 8/2- not on vent. Says she does not know what she is going through. Reports she is confused.    VITAL SIGNS: BP 125/79   Pulse  83   Temp 97.7 F (36.5 C) (Axillary)   Resp 15   Ht 5\' 2"  (1.575 m)   Wt 62.1 kg (136 lb 14.5 oz)   SpO2 93%   BMI 25.04 kg/m   HEMODYNAMICS:   VENTILATOR SETTINGS:   INTAKE / OUTPUT: I/O last 3 completed shifts: In: 4211.3 [I.V.:1801.3; IV Piggyback:2410] Out: 525 [Urine:525]  PHYSICAL EXAMINATION:  General Appearance:    Looks ill but pleasant and cooperative. No distress  Head:    Normocephalic, without obvious abnormality, atraumatic  Eyes:    PERRL - yes, conjunctiva/corneas - clear      Ears:    Normal external ear canals, both ears  Nose:   NG tube - no  Throat:  ETT TUBE - no , OG tube - no  Neck:   Supple,  No enlargement/tenderness/nodules     Lungs:     Clear to auscultation bilaterally, Ventilator   Synchrony - na not on vent  Chest wall:    No deformity  Heart:    S1 and S2 normal, no murmur, CVP - no.  Pressors - no  Abdomen:     Soft, no masses, no organomegaly  Genitalia:    Not done  Rectal:   not done  Extremities:   Extremities- no cyanosis, no clubbing, no edema     Skin:   Intact in exposed areas . Sacral area - no reported decub     Neurologic:   Sedation - none -> RASS - +1 . Moves all 4s - yes. CAM-ICU -  mildly positive for delirium  . Orientation - partial      LABS: PULMONARY  Recent Labs Lab 10/30/16 0017  PHART 7.238*  PCO2ART 43.9  PO2ART 441.0*  HCO3 18.8*  TCO2 20  O2SAT 100.0    CBC  Recent Labs Lab 10/30/16 0010 10/30/16 0455 10/31/16 0529  HGB 16.2* 13.9 15.2*  HCT 45.5 38.5 43.3  WBC 15.3* 15.7* 10.8*  PLT 392 282 243    COAGULATION No results for input(s): INR in the last 168 hours.  CARDIAC   Recent Labs Lab 10/30/16 1902 10/30/16 2314 10/31/16 0529  TROPONINI 0.03* 0.04* 0.04*   No results for input(s): PROBNP in the last 168 hours.   CHEMISTRY  Recent Labs Lab 10/30/16 0010 10/30/16 0455 10/30/16 1902 10/31/16 0529  NA 129* 130*  --  139  K 3.7 2.9*  --  3.4*  CL 89* 99*  --  108   CO2 17* 24  --  21*  GLUCOSE 242* 107*  --  94  BUN 8 7  --  6  CREATININE 1.05* 0.70  --  0.72  CALCIUM 9.7 8.5*  --  8.6*  MG  --   --  2.0 1.8  PHOS  --   --   --  2.8   Estimated Creatinine Clearance: 57.5 mL/min (by C-G formula based on SCr of 0.72 mg/dL).   LIVER  Recent Labs Lab 10/30/16 0010 10/30/16 0455 10/31/16 0529  AST 54* 32  --   ALT 35 27  --   ALKPHOS 87 58  --   BILITOT 1.2 0.9  --   PROT 8.0 5.7*  --   ALBUMIN 4.7 3.5 3.2*     INFECTIOUS  Recent Labs Lab 10/30/16 0016 10/30/16 0455  LATICACIDVEN 14.85* 1.0     ENDOCRINE CBG (last 3)   Recent Labs  10/31/16 0118 10/31/16 0326 10/31/16 0812  GLUCAP 109* 110* 95         IMAGING x48h  - image(s) personally visualized  -   highlighted in bold Ct Head Wo Contrast  Result Date: 10/30/2016 CLINICAL DATA:  Initial evaluation for acute altered mental status, seizure. EXAM: CT HEAD WITHOUT CONTRAST TECHNIQUE: Contiguous axial images were obtained from the base of the skull through the vertex without intravenous contrast. COMPARISON:  Prior CT from 04/05/2016. FINDINGS: Brain: Cerebral volume within normal limits, and stable from previous. There is subtle patchy hypodensity within the parasagittal left parieto-occipital region (series 3, image 23), not definitely seen on previous. Additional possible vague hypodensity within the adjacent parasagittal right parieto-occipital lobe as well. Findings are nonspecific, but could reflect sequelae of PRES given history of seizure. Underlying mass not excluded. Few additional scattered subcortical hypodensities involving the bilateral frontal lobes are grossly similar to previous, and may be related underlying chronic microvascular ischemic changes. No acute intracranial hemorrhage. No other evidence for acute large vessel territory infarct. No other mass lesion or midline shift. No hydrocephalus. No extra-axial fluid collection. Vascular: No hyperdense vessel.  Scattered vascular calcifications noted within the carotid siphons. Skull: Scalp soft tissues and calvarium within normal limits. Sinuses/Orbits: Globes and orbital soft tissues within normal limits. Mild scattered mucosal thickening within the left sphenoid and maxillary sinuses as well the ethmoidal air cells. No mastoid effusion. IMPRESSION: Patchy hypodensity within the parasagittal left parietal lobe, new from prior. Given the history of seizure, finding may reflect sequelae of PRES. Underlying mass not entirely excluded. Further evaluation with brain MRI, with and without gadolinium, recommended.  Findings discussed by telephone at the time of interpretation on 10/30/2016 at approximately 1:45 a.m. with Dr. Kathrynn Speed. Electronically Signed   By: Jeannine Boga M.D.   On: 10/30/2016 02:04   Mr Brain W And Wo Contrast  Result Date: 10/30/2016 CLINICAL DATA:  Initial evaluation for acute seizure. EXAM: MRI HEAD WITHOUT AND WITH CONTRAST TECHNIQUE: Multiplanar, multiecho pulse sequences of the brain and surrounding structures were obtained without and with intravenous contrast. CONTRAST:  65mL MULTIHANCE GADOBENATE DIMEGLUMINE 529 MG/ML IV SOLN COMPARISON:  Prior head CT from earlier the same day. FINDINGS: Brain: Cerebral volume within normal limits for age. Patchy T2/FLAIR hyperintensity involving the periventricular and deep white matter both cerebral hemispheres most consistent with chronic small vessel ischemic disease, overall stable from previous. Multifocal patchy T2/FLAIR signal abnormality seen involving the cortical gray matter and underlying subcortical deep white matter of the bilateral parieto-occipital regions in a distribution most consistent with PRES. Associated mild patchy diffusion abnormality. No associated hemorrhage or abnormal enhancement. No other evidence for acute infarct. No mass lesion, midline shift or mass effect. No hydrocephalus. No extra-axial fluid collection.  Major dural sinuses are patent. No other abnormal enhancement. Pituitary gland suprasellar region within normal limits. Vascular: Major intracranial vascular flow voids are maintained. Skull and upper cervical spine: Cerebellar tonsils mildly low lying within the foramen magnum without frank Chiari malformation. Mild degenerate spondylolysis noted within upper cervical spine without significant stenosis. Bone marrow signal intensity within normal limits. No scalp soft tissue abnormality. Sinuses/Orbits: Globes and orbital soft tissues within normal limits. Mild scattered mucosal thickening within the paranasal sinuses. Air-fluid level noted within the left sphenoid sinus. No significant mastoid effusion. Inner ear structures within normal limits. IMPRESSION: 1. Findings consistent with PRES as above. 2. Otherwise stable underlying chronic microvascular ischemic disease. 3. Acute left sphenoid sinusitis. Electronically Signed   By: Jeannine Boga M.D.   On: 10/30/2016 06:08   Dg Chest Portable 1 View  Result Date: 10/30/2016 CLINICAL DATA:  Altered mental status EXAM: PORTABLE CHEST 1 VIEW COMPARISON:  04/05/2016 FINDINGS: Shallow inspiration. Heart size and pulmonary vascularity are normal. Probable emphysematous changes in the lungs. No airspace disease or consolidation. No blunting of costophrenic angles. No pneumothorax. Calcified and tortuous aorta. IMPRESSION: Shallow inspiration. Probable emphysematous changes in the lungs. Aortic atherosclerosis. No active pulmonary disease. Electronically Signed   By: Lucienne Capers M.D.   On: 10/30/2016 00:45      DISCUSSION: 67 yoF admitted with new onset seizure and HTN urgency c/w PRES.   ASSESSMENT / PLAN:  PULMONARY A: COPD without acute exacerbation  10/31/2016 - no distress P:   O2 as needed for sats > 92% brovanna and budesonide BID, albuterol prn Pulmonary hygiene  CARDIOVASCULAR A:  HTN urgency Hx HLD, PAF  10/31/2016 - on and off  cardene. Current sbp 163  P:  Get echo Tele monitoring Cardene for strict goal SBP 170-190 for 24 hours - continue Hold home lipitor, diltiazem, asa, losartan-HCTZ D/c vasotec, apresoline, labetalol   RENAL A:   Mild hypomag  P:   repleete mag S/p KCL replete Free water restriction NS at 50 ml/hr Trend BMP / urinary output Replace electrolytes as indicated Avoid nephrotoxic agents, ensure adequate renal perfusion  GASTROINTESTINAL A:   No acute issues P:   NPO for now with AMS Protonix for SUP  HEMATOLOGIC A:   Leukocytosis- likely reactive Concern for ?vaginal cancer P:  Trend CBC SCDs for now  INFECTIOUS A:   Leukocytosis -afebrile,  UA neg, CXR neg P:   Monitor clinically Follow fever curve, trend WBC  ENDOCRINE A:   hyperglycemia  - TSH 0.728 P:   CBG q 4 SSI HA1c pending  NEUROLOGIC A:   PRES w/new onset seizures Acute toxic/metabolic encephalopathy- responded to narcan + PRES - EEG with diffuse slowing; no epileptiform discharges - MRI c/w PRES - UDS + benzo/opiates Hx of prior CVA   10/31/2016  = ? Better. Pleasantly confused P:   Appreciate Neurology recs Strict BP control x 24 hrs as above Continue Keppra Seizure precautions Ativan prn seizures Frequent neuro assessments SLP when able Hold home roxicet, mirapex, meclizine   FAMILY  - Updates no family at bedside.  Per prior records, patient is DNR.   - Inter-disciplinary family meet or Palliative Care meeting due by:  11/06/16    The patient is critically ill with multiple organ systems failure and requires high complexity decision making for assessment and support, frequent evaluation and titration of therapies, application of advanced monitoring technologies and extensive interpretation of multiple databases.   Critical Care Time devoted to patient care services described in this note is  30  Minutes. This time reflects time of care of this signee Dr Brand Males. This  critical care time does not reflect procedure time, or teaching time or supervisory time of PA/NP/Med student/Med Resident etc but could involve care discussion time    Dr. Brand Males, M.D., [facility].C.P Pulmonary and Critical Care Medicine Staff Physician [place] [facility] Pager: [phone], If no answer or between  15:00h - 7:00h: call [phone]  10/31/2016 10:38 AM

## 2016-10-31 NOTE — Progress Notes (Signed)
BP very labile: on 2mg  cardene BPs 90 systolic. Without cardene, BPs 753Y systolic.Wiil discuss with CCM.

## 2016-11-01 ENCOUNTER — Inpatient Hospital Stay (HOSPITAL_COMMUNITY): Payer: Medicare Other

## 2016-11-01 DIAGNOSIS — I10 Essential (primary) hypertension: Secondary | ICD-10-CM

## 2016-11-01 DIAGNOSIS — I480 Paroxysmal atrial fibrillation: Secondary | ICD-10-CM

## 2016-11-01 LAB — ECHOCARDIOGRAM COMPLETE
E decel time: 203 msec
E/e' ratio: 6.98
FS: 30 % (ref 28–44)
Height: 62 in
IVS/LV PW RATIO, ED: 0.93
LA ID, A-P, ES: 35 mm
LA diam end sys: 35 mm
LA diam index: 2.11 cm/m2
LA vol A4C: 44.6 ml
LA vol index: 28.4 mL/m2
LA vol: 47.1 mL
LV E/e' medial: 6.98
LV E/e'average: 6.98
LV PW d: 9.75 mm — AB (ref 0.6–1.1)
LV e' LATERAL: 9.46 cm/s
LVOT SV: 67 mL
LVOT VTI: 23.5 cm
LVOT area: 2.84 cm2
LVOT diameter: 19 mm
LVOT peak grad rest: 6 mmHg
LVOT peak vel: 126 cm/s
Lateral S' vel: 10.9 cm/s
MV Dec: 203
MV pk A vel: 106 m/s
MV pk E vel: 66 m/s
TAPSE: 20 mm
TDI e' lateral: 9.46
TDI e' medial: 7.07
Weight: 2190.49 oz

## 2016-11-01 LAB — GLUCOSE, CAPILLARY
Glucose-Capillary: 87 mg/dL (ref 65–99)
Glucose-Capillary: 84 mg/dL (ref 65–99)
Glucose-Capillary: 150 mg/dL — ABNORMAL HIGH (ref 65–99)
Glucose-Capillary: 88 mg/dL (ref 65–99)
Glucose-Capillary: 90 mg/dL (ref 65–99)

## 2016-11-01 MED ORDER — HYDRALAZINE HCL 20 MG/ML IJ SOLN: 10.0000 mg | INTRAMUSCULAR | Status: DC | PRN

## 2016-11-01 MED ORDER — AMLODIPINE BESYLATE 10 MG PO TABS
10.0000 mg | ORAL_TABLET | Freq: Every day | ORAL | Status: DC
  Administered 2016-11-01 – 2016-11-03 (×3): 10 mg via ORAL
  Filled 2016-11-01 (×3): qty 1

## 2016-11-01 MED ORDER — FAMOTIDINE 20 MG PO TABS
20.0000 mg | ORAL_TABLET | Freq: Every day | ORAL | Status: DC
  Administered 2016-11-01 – 2016-11-02 (×2): 20 mg via ORAL
  Filled 2016-11-01 (×2): qty 1

## 2016-11-01 NOTE — Progress Notes (Signed)
Subjective: Laying in bed comfortably. Alert and oriented, following commands. States that her back hurts. Denies chest pain, palpitations, SOB. BP ~164/72 pm my visit. Tolerating Keppra well.  Current Pertinent Medications: Keppta 500mg  BID Cardene gtt stopped 11/01/16 Labetalol gtt stopped 10/31/16  Pertinent Labs/Diagnostics: EEG 10/31/16: This awake and drowsy EEG is abnormal due to diffuse slowing of the waking background. CT head 10/30/16: Patchy hypodensity within the parasagittal left parietal lobe MR brain 10/30/16: Findings consistent with PRES. Chronic small vessel disease.  Physical Examination: Vitals:   11/01/16 0800 11/01/16 0825  BP: (!) 167/71 (!) 159/81  Pulse: 86 86  Resp: 17 (!) 23  Temp: 98.4 F (36.9 C)     General: WDWN female.  HEENT:  Normocephalic, no lesions, without obvious abnormality.  Normal external eye and conjunctiva.  Normal external ears. Normal external nose, mucus membranes and septum.  Normal pharynx. Cardiovascular: S1, S2 normal, pulses palpable throughout   Pulmonary: Chest clear, unlabored breathing Abdomen: Soft, non tender Extremities: no joint deformities, effusion, or inflammation Musculoskeletal: no joint tenderness, deformity or swelling  Skin: warm and dry, no hyperpigmentation, vitiligo, or suspicious lesions  Neurological Examination:  CN: Pupils are equal, round and symmetrically reactive from 3-->2 mm. EOMI without nystagmus. Facial sensation is intact to light touch. Face is symmetric at rest with normal strength and mobility. Hearing is intact to conversational voice. Palate elevates symmetrically and uvula is midline. Voice is normal in tone, pitch and quality. Bilateral SCM and trapezii are 5/5. Tongue is midline with normal bulk and mobility.  Motor: Normal bulk, tone, and strength. 5/5 throughout BUE. 4+/5 BLE with pain in legs. No drift.  Sensation: Intact to light touch.  DTRs: 2+, symmetric BUE and AJ. 3+ patellars. Toes  downgoing bilaterally. No pathologic reflexes.  Coordination: Finger-to-nose and heel-to-shin are without dysmetria    Assessment: 71 year old female with PRES, now clinically resolved. With EEG negative and MRI with T2/FLAIR abnormality in the parieto-occipital deep white matter bilaterally, favoring PRES and without acute infarct/abnormality. Her BP has remained elevated for much of her hospital stay. I would favor keeping her on Keppra 500mg  BID PO until her blood pressure is maintained within range in the outpatient setting.  Recommendations: 1) Blood pressure control per primary team 2) Continue Keppra for seizure ppx - change to 500mg  PO BID and continue on discharge. Keppra can eventually be discontinued after assessment on clinic follow up.  3) Correction of metabolic derangements per primary team.   Solon Augusta PA-C [facility] [phone]  11/01/2016, 8:42 AM   NEUROHOSPITALIST ADDENDUM Seen and examined the patient this AM. Formulated plan as documented above. Recommendations as above.  We will sign off. The patient cannot drive x 6 months. Please continue Keppra until seen by Neurology in clinic.   Karena Addison Fenna Semel MD [facility] [phone]  If 7pm to 7am, please call on call as listed on AMION.

## 2016-11-01 NOTE — Progress Notes (Signed)
  Speech Language Pathology Treatment: Dysphagia  Patient Details Name: Kim Blankenship MRN: [id number] DOB: 1945-10-29 Today's Date: 11/01/2016 Time: 3361-2244 SLP Time Calculation (min) (ACUTE ONLY): 15 min  Assessment / Plan / Recommendation Clinical Impression  Pt shows improving sustained attention, awareness, and orientation, although she continues to show reduced short-term and long-term memory as well as orientation to situation. She consumed solids and liquids with no signs of difficulty. Recommend advancement to regular textures and thin liquids with only intermittent supervision suggested. Pt may benefit from SLP cognitive-linguistic evaluation and 24/hr supervision if mentation is slow to clear.    HPI HPI: Pt is a 71 y/o F admitted with new onset seizure and HTN urgency. MRI suggestive of PRES. PMH includes a fib, COPD, HTN, CVA, and concern for possible vaginal cancer (not diagnosed).      SLP Plan  Continue with current plan of care       Recommendations  Diet recommendations: Regular;Thin liquid Liquids provided via: Cup;Straw Medication Administration: Whole meds with liquid Supervision: Patient able to self feed;Intermittent supervision to cue for compensatory strategies Compensations: Slow rate;Small sips/bites;Minimize environmental distractions Postural Changes and/or Swallow Maneuvers: Seated upright 90 degrees                Oral Care Recommendations: Oral care BID Follow up Recommendations: 24 hour supervision/assistance;Other (comment) (may need f/u for cognition) SLP Visit Diagnosis: Dysphagia, unspecified (R13.10) Plan: Continue with current plan of care       GO                Germain Osgood 11/01/2016, 10:01 AM  Germain Osgood, M.A. CCC-SLP [phone]

## 2016-11-01 NOTE — Progress Notes (Signed)
  Echocardiogram 2D Echocardiogram has been performed.  Kim Blankenship 11/01/2016, 4:09 PM

## 2016-11-01 NOTE — Progress Notes (Signed)
PULMONARY / CRITICAL CARE MEDICINE   Name: Kim Blankenship MRN:   [id number] DOB:   12-31-1945           ADMISSION DATE:  10/29/2016 CONSULTATION DATE:  8/1  REFERRING MD:  Dr. Wyline Copas  CHIEF COMPLAINT:  Seizure, HTN brief 71 year old female with PMH as below, which is significant for AF, COPD, HTN, and CVA. She had 2 seizure like periods on 7/31 and presented to [facility] ED.  Of note, patient reportedly taking tramadol for vaginal sores, possibly cancer, but undiagnosed.   Patient found thrashing around at home by daughter, LSW 2200 on 7/31.  Found agonal with pinpoint pupils by EMS, given narcan with change to garbled speech w/right gaze and increased respirations.  Then patient had witnessed tonic-clonic seizure w/ EMS and treated with versed.  Hypertensive with SBP 214 on arrival.  Neurology consulted and keppra started.  CT head with hypodensity concerning for PRES or underlying mass.  MRI findings consistent with PRES.  EEG with diffuse slowing.  Patient has remained confused with liable blood pressure with PRN meds.  Labs noted for neg troponin, lactate of 14 of which has now cleared, Na 129, K 3.7, sCr 1.05.   PCCM consulted for strict BP control in the ICU and better monitoring.     STUDIES:  10/30/16 CT head >> new patchy hypodensity w/in parasagittal left parietal lobe; underlying mass not excluded 10/30/16>> findings consistent w/ PRES- multifocal patchy T2 flair signal abnormality seen involving the cortical gray matter and underlying subcortical deep white matter of the bilateral parieto-occipital regions; otherwise stable underlying chronic microvascular ischemic disease; acute left sphenoid sinusitis  CULTURES: none  ANTIBIOTICS: none  LINES/TUBES: PIV   SIGNIFICANT EVENTS: 7/31 Admit 8/1 - CCM consult 8/2- not on vent. Says she does not know what she is going through. Reports she is confused.     SUBJECTIVE/OVERNIGHT/INTERVAL HX 8/3 - code status vchanged to  full code yesterday by daughter. Per neuro off cardene and labetalol gtt. Currently on keprra bid . Discussed code status - now oriented - full code   VITAL SIGNS: BP (!) 159/81   Pulse 86   Temp 98.4 F (36.9 C) (Oral)   Resp (!) 23   Ht 5\' 2"  (1.575 m)   Wt 62.1 kg (136 lb 14.5 oz)   SpO2 100%   BMI 25.04 kg/m   HEMODYNAMICS:   VENTILATOR SETTINGS:   INTAKE / OUTPUT: I/O last 3 completed shifts: In: 3378.6 [P.O.:240; I.V.:2823.6; IV Piggyback:315] Out: 2125 [Urine:2125]   General Appearance:    Looks better. Deconditioned  Head:    Normocephalic, without obvious abnormality, atraumatic  Eyes:    PERRL - yes, conjunctiva/corneas - clear      Ears:    Normal external ear canals, both ears  Nose:   NG tube - no  Throat:  ETT TUBE - no , OG tube - no  Neck:   Supple,  No enlargement/tenderness/nodules     Lungs:     Clear to auscultation bilaterally, Ventilator   Synchrony - na  Chest wall:    No deformity  Heart:    S1 and S2 normal, no murmur, CVP - no.  Pressors - no  Abdomen:     Soft, no masses, no organomegaly  Genitalia:    Not done  Rectal:   not done  Extremities:   Extremities- intact     Skin:   Intact in exposed areas . Sacral area - no reported  decub     Neurologic:   Sedation - none -> RASS - 0 . Moves all 4s - yes. CAM-ICU - neg for delirium . Orientation - x3+      LABS: PULMONARY  Recent Labs Lab 10/30/16 0017  PHART 7.238*  PCO2ART 43.9  PO2ART 441.0*  HCO3 18.8*  TCO2 20  O2SAT 100.0    CBC  Recent Labs Lab 10/30/16 0010 10/30/16 0455 10/31/16 0529  HGB 16.2* 13.9 15.2*  HCT 45.5 38.5 43.3  WBC 15.3* 15.7* 10.8*  PLT 392 282 243    COAGULATION No results for input(s): INR in the last 168 hours.  CARDIAC    Recent Labs Lab 10/30/16 1902 10/30/16 2314 10/31/16 0529  TROPONINI 0.03* 0.04* 0.04*   No results for input(s): PROBNP in the last 168 hours.   CHEMISTRY  Recent Labs Lab 10/30/16 0010 10/30/16 0455  10/30/16 1902 10/31/16 0529  NA 129* 130*  --  139  K 3.7 2.9*  --  3.4*  CL 89* 99*  --  108  CO2 17* 24  --  21*  GLUCOSE 242* 107*  --  94  BUN 8 7  --  6  CREATININE 1.05* 0.70  --  0.72  CALCIUM 9.7 8.5*  --  8.6*  MG  --   --  2.0 1.8  PHOS  --   --   --  2.8   Estimated Creatinine Clearance: 57.5 mL/min (by C-G formula based on SCr of 0.72 mg/dL).   LIVER  Recent Labs Lab 10/30/16 0010 10/30/16 0455 10/31/16 0529  AST 54* 32  --   ALT 35 27  --   ALKPHOS 87 58  --   BILITOT 1.2 0.9  --   PROT 8.0 5.7*  --   ALBUMIN 4.7 3.5 3.2*     INFECTIOUS  Recent Labs Lab 10/30/16 0016 10/30/16 0455  LATICACIDVEN 14.85* 1.0     ENDOCRINE CBG (last 3)   Recent Labs  10/31/16 2331 11/01/16 0326 11/01/16 0744  GLUCAP 114* 87 84         IMAGING x48h  - image(s) personally visualized  -   highlighted in bold No results found.    DISCUSSION: 28 yoF admitted with new onset seizure and HTN urgency c/w PRES.   ASSESSMENT / PLAN:  PULMONARY A: COPD without acute exacerbation  11/01/2016 - lung clear P:   O2 as needed for sats > 92% brovanna and budesonide BID, albuterol prn Pulmonary hygiene  CARDIOVASCULAR A:  HTN urgency Hx HLD, PAF  11/01/2016 - off all bp gtt. SBP 150.e ch o with ef 65% with gr2 diast dysfn  P:  Dc tele Start daily norvasc 10mg  sbp goal < 140  Hold home lipitor, diltiazem, asa, losartan-HCTZ Hydralazine prn   RENAL A:   Mild hypomag8/2 - repleted  P:   Saline lock Check lytes 11/02/16 Avoid nephrotoxic agents, ensure adequate renal perfusion  GASTROINTESTINAL A:   No acute issues P:   d3 diet Protonix for SUP  HEMATOLOGIC A:   Leukocytosis- likely reactive Concern for ?vaginal cancer P:  Trend CBC SCDs for now  INFECTIOUS A:   Leukocytosis -afebrile, UA neg, CXR neg P:   Monitor clinically Follow fever curve, trend WBC  ENDOCRINE A:   hyperglycemia  - TSH 0.728 P:   CBG q 4 SSI HA1c  pending  NEUROLOGIC A:   PRES w/new onset seizures Acute toxic/metabolic encephalopathy- responded to narcan + PRES - EEG with diffuse  slowing; no epileptiform discharges - MRI c/w PRES - UDS + benzo/opiates Hx of prior CVA   11/01/2016  =normal mental status  P:   Continue Keppra and to be eval in opd if can dc (per neuro) Holld home roxicet, mirapex, meclizine - reitnroduce slowly / as needed   FAMILY  - Updates no family at bedside. Full code   - Inter-disciplinary family meet or Palliative Care meeting due by:  11/06/16   Move to med surg with trh primary from 11/02/16 - d.w Dr Thereasa Solo   Dr. Brand Males, M.D., [facility].C.P Pulmonary and Critical Care Medicine Staff Physician [place] [facility] Pager: [phone], If no answer or between  15:00h - 7:00h: call [phone]  11/01/2016 10:18 AM

## 2016-11-02 ENCOUNTER — Encounter (HOSPITAL_COMMUNITY): Payer: Self-pay | Admitting: General Practice

## 2016-11-02 DIAGNOSIS — I480 Paroxysmal atrial fibrillation: Secondary | ICD-10-CM

## 2016-11-02 LAB — CBC WITH DIFFERENTIAL/PLATELET
Basophils Absolute: 0 10*3/uL (ref 0.0–0.1)
Basophils Relative: 0 %
Eosinophils Absolute: 0.1 10*3/uL (ref 0.0–0.7)
Eosinophils Relative: 1 %
HCT: 39 % (ref 36.0–46.0)
Hemoglobin: 13.6 g/dL (ref 12.0–15.0)
Lymphocytes Relative: 11 %
Lymphs Abs: 1.2 10*3/uL (ref 0.7–4.0)
MCH: 31.3 pg (ref 26.0–34.0)
MCHC: 34.9 g/dL (ref 30.0–36.0)
MCV: 89.7 fL (ref 78.0–100.0)
Monocytes Absolute: 0.8 10*3/uL (ref 0.1–1.0)
Monocytes Relative: 8 %
Neutro Abs: 8.3 10*3/uL — ABNORMAL HIGH (ref 1.7–7.7)
Neutrophils Relative %: 80 %
Platelets: 242 10*3/uL (ref 150–400)
RBC: 4.35 MIL/uL (ref 3.87–5.11)
RDW: 12 % (ref 11.5–15.5)
WBC: 10.4 10*3/uL (ref 4.0–10.5)

## 2016-11-02 LAB — BASIC METABOLIC PANEL
Anion gap: 7 (ref 5–15)
Anion gap: 7 (ref 5–15)
BUN: <5 mg/dL — ABNORMAL LOW (ref 6–20)
BUN: <5 mg/dL — ABNORMAL LOW (ref 6–20)
CO2: 26 mmol/L (ref 22–32)
CO2: 27 mmol/L (ref 22–32)
Calcium: 8.9 mg/dL (ref 8.9–10.3)
Calcium: 8.9 mg/dL (ref 8.9–10.3)
Chloride: 106 mmol/L (ref 101–111)
Chloride: 108 mmol/L (ref 101–111)
Creatinine, Ser: 0.61 mg/dL (ref 0.44–1.00)
Creatinine, Ser: 0.65 mg/dL (ref 0.44–1.00)
GFR calc Af Amer: >60 mL/min (ref >60)
GFR calc Af Amer: >60 mL/min (ref >60)
GFR calc non Af Amer: >60 mL/min (ref >60)
GFR calc non Af Amer: >60 mL/min (ref >60)
Glucose, Bld: 112 mg/dL — ABNORMAL HIGH (ref 65–99)
Glucose, Bld: 120 mg/dL — ABNORMAL HIGH (ref 65–99)
Potassium: 2.9 mmol/L — ABNORMAL LOW (ref 3.5–5.1)
Potassium: 3.3 mmol/L — ABNORMAL LOW (ref 3.5–5.1)
Sodium: 140 mmol/L (ref 135–145)
Sodium: 141 mmol/L (ref 135–145)

## 2016-11-02 LAB — GLUCOSE, CAPILLARY
Glucose-Capillary: 102 mg/dL — ABNORMAL HIGH (ref 65–99)
Glucose-Capillary: 104 mg/dL — ABNORMAL HIGH (ref 65–99)
Glucose-Capillary: 113 mg/dL — ABNORMAL HIGH (ref 65–99)
Glucose-Capillary: 119 mg/dL — ABNORMAL HIGH (ref 65–99)
Glucose-Capillary: 133 mg/dL — ABNORMAL HIGH (ref 65–99)
Glucose-Capillary: 160 mg/dL — ABNORMAL HIGH (ref 65–99)
Glucose-Capillary: 96 mg/dL (ref 65–99)

## 2016-11-02 LAB — PHOSPHORUS: Phosphorus: 2.2 mg/dL — ABNORMAL LOW (ref 2.5–4.6)

## 2016-11-02 LAB — MAGNESIUM: Magnesium: 1.7 mg/dL (ref 1.7–2.4)

## 2016-11-02 MED ORDER — METOPROLOL TARTRATE 25 MG PO TABS
25.0000 mg | ORAL_TABLET | Freq: Two times a day (BID) | ORAL | Status: DC
  Administered 2016-11-02 – 2016-11-03 (×2): 25 mg via ORAL
  Filled 2016-11-02 (×3): qty 1

## 2016-11-02 MED ORDER — LOSARTAN POTASSIUM-HCTZ 100-25 MG PO TABS: 1.0000 | ORAL_TABLET | Freq: Every day | ORAL | Status: DC

## 2016-11-02 MED ORDER — HYDROCHLOROTHIAZIDE 25 MG PO TABS
25.0000 mg | ORAL_TABLET | Freq: Every day | ORAL | Status: DC
  Administered 2016-11-02: 25 mg via ORAL
  Filled 2016-11-02: qty 1

## 2016-11-02 MED ORDER — LEVETIRACETAM 500 MG PO TABS
500.0000 mg | ORAL_TABLET | Freq: Two times a day (BID) | ORAL | Status: DC
  Administered 2016-11-02 – 2016-11-03 (×2): 500 mg via ORAL
  Filled 2016-11-02 (×2): qty 1

## 2016-11-02 MED ORDER — LOSARTAN POTASSIUM 50 MG PO TABS
100.0000 mg | ORAL_TABLET | Freq: Every day | ORAL | Status: DC
  Administered 2016-11-02: 100 mg via ORAL
  Filled 2016-11-02: qty 2

## 2016-11-02 MED ORDER — HYDRALAZINE HCL 20 MG/ML IJ SOLN
10.0000 mg | INTRAMUSCULAR | Status: DC | PRN
  Administered 2016-11-02 – 2016-11-03 (×2): 10 mg via INTRAVENOUS
  Filled 2016-11-02 (×2): qty 1

## 2016-11-02 MED ORDER — KETOROLAC TROMETHAMINE 15 MG/ML IJ SOLN: 15.0000 mg | Freq: Four times a day (QID) | INTRAMUSCULAR | Status: DC | PRN

## 2016-11-02 MED ORDER — ATORVASTATIN CALCIUM 10 MG PO TABS
20.0000 mg | ORAL_TABLET | Freq: Every day | ORAL | Status: DC
  Administered 2016-11-02 – 2016-11-03 (×2): 20 mg via ORAL
  Filled 2016-11-02 (×3): qty 2

## 2016-11-02 MED ORDER — POTASSIUM CHLORIDE CRYS ER 20 MEQ PO TBCR
60.0000 meq | EXTENDED_RELEASE_TABLET | Freq: Once | ORAL | Status: AC
  Administered 2016-11-02: 60 meq via ORAL
  Filled 2016-11-02: qty 3

## 2016-11-02 MED ORDER — HYDRALAZINE HCL 10 MG PO TABS
10.0000 mg | ORAL_TABLET | Freq: Three times a day (TID) | ORAL | Status: DC
  Administered 2016-11-02 – 2016-11-03 (×3): 10 mg via ORAL
  Filled 2016-11-02 (×4): qty 1

## 2016-11-02 NOTE — Progress Notes (Signed)
Patient removed her IV she still has IV medication due in AM 8/4, IV nurse to start new IV.

## 2016-11-02 NOTE — Progress Notes (Signed)
PROGRESS NOTE    Kim Blankenship  XAJ:[id number] DOB: 1946/01/11 DOA: 10/29/2016 PCP: Briscoe Deutscher, MD    Brief Narrative:  71 y.o.femalewith history of COPD, hypertension, hyperlipidemia paroxysmal atrial fibrillation was brought to the ER patient had a seizure. As per the ER physician Carmell Austria was called at home of the patient was found to be confused. On arrival EMS found patient's pupils were pinpoint and Narcan was given. Necessary patient was witnessed tonic-clonic seizure which lasted for 1 minute and Versed 2.5 IV was given. En route to the ER patient had another seizure. As per the report patient was recently started on tramadol for vaginal sores.  Assessment & Plan:   Principal Problem:   Seizures ([facility]) Active Problems:   COPD (chronic obstructive pulmonary disease) (HCC)   Seizure (HCC)   Hypertensive urgency   PAF (paroxysmal atrial fibrillation) (HCC)   PRES (posterior reversible encephalopathy syndrome)   1. PRES with seizures 1. Patient presented with recurrent seizures and acute encephalopathy 2. MRI reviewed, no acute CVA, findings suggestive of PRES 3. Patient was seen by Neurology who recommended starting cardene gtt for aggressive BP control. Patient was moved to ICU where blood pressures were better controlled. Cardene was discontinued and patient was transferred to floor 8/3 4. Pt was started on norvasc by critical care. 5. Today, bp trended up with SBP again into the 200's. Patient's home cozaar was resumed with addition of scheduled hydralazine TID. IV hydralazine PRN was also added 2. Hypertensive crisis 1. Per above, patient had required cardene gtt 2. BP trending up since cardene gtt was stopped. Per above 3. Acute toxic-metabolic encephalopathy 1. Initially presented encephalopathic and markedly confused 2. Likely secondary to PRES 3. Mental status now improved, although pt still has apparent memory deficits and is slow to answer questions 4. Metabolic  acidosis/lactic acidosis 1. Presenting lactic acid of just under 15 with repeat lactate of 1.0 2. resolved 5. COPD 1. No wheezing on exam 2. Remains on minimal O2 support 6. P-Afib 1. Stable at present 2. Cardizem was discontinued earlier 3. Will start patient on scheduled metoprolol 7. HLD 1. Stable currently 2. Resume home statin  DVT prophylaxis: SCD's Code Status: Full Family Communication: Pt in room, family not at bedside Disposition Plan: Uncertain at this time  Consultants:   Critical Care  Procedures:     Antimicrobials: Anti-infectives    None       Subjective: Eager to go home  Objective: Vitals:   11/02/16 1214 11/02/16 1340 11/02/16 1345 11/02/16 1750  BP:  (!) 225/88 (!) 214/86 (!) 161/69  Pulse:  94  98  Resp:  20  20  Temp:  98.2 F (36.8 C)    TempSrc:  Oral    SpO2: 95% 94%  98%  Weight:      Height:        Intake/Output Summary (Last 24 hours) at 11/02/16 1800 Last data filed at 11/02/16 0756  Gross per 24 hour  Intake              210 ml  Output                0 ml  Net              210 ml   Filed Weights   10/30/16 1900  Weight: 62.1 kg (136 lb 14.5 oz)    Examination:  General exam: Appears calm and comfortable  Respiratory system: Clear to auscultation. Respiratory effort normal. Cardiovascular system:  S1 & S2 heard, RRR. Gastrointestinal system: Abdomen is nondistended, soft and nontender. No organomegaly or masses felt. Normal bowel sounds heard. Central nervous system: Alert and oriented. No focal neurological deficits. Extremities: Symmetric 5 x 5 power. Skin: No rashes, lesions  Psychiatry: Judgement and insight appear normal. Mood & affect appropriate.   Data Reviewed: I have personally reviewed following labs and imaging studies  CBC:  Recent Labs Lab 10/30/16 0010 10/30/16 0455 10/31/16 0529 11/02/16 0012  WBC 15.3* 15.7* 10.8* 10.4  NEUTROABS 12.2* 13.4*  --  8.3*  HGB 16.2* 13.9 15.2* 13.6  HCT  45.5 38.5 43.3 39.0  MCV 90.8 88.1 91.4 89.7  PLT 392 282 243 546   Basic Metabolic Panel:  Recent Labs Lab 10/30/16 0010 10/30/16 0455 10/30/16 1902 10/31/16 0529 11/02/16 0012 11/02/16 0743  NA 129* 130*  --  139 140 141  K 3.7 2.9*  --  3.4* 2.9* 3.3*  CL 89* 99*  --  108 106 108  CO2 17* 24  --  21* 27 26  GLUCOSE 242* 107*  --  94 120* 112*  BUN 8 7  --  6 <5* <5*  CREATININE 1.05* 0.70  --  0.72 0.65 0.61  CALCIUM 9.7 8.5*  --  8.6* 8.9 8.9  MG  --   --  2.0 1.8 1.7  --   PHOS  --   --   --  2.8 2.2*  --    GFR: Estimated Creatinine Clearance: 57.5 mL/min (by C-G formula based on SCr of 0.61 mg/dL). Liver Function Tests:  Recent Labs Lab 10/30/16 0010 10/30/16 0455 10/31/16 0529  AST 54* 32  --   ALT 35 27  --   ALKPHOS 87 58  --   BILITOT 1.2 0.9  --   PROT 8.0 5.7*  --   ALBUMIN 4.7 3.5 3.2*   No results for input(s): LIPASE, AMYLASE in the last 168 hours. No results for input(s): AMMONIA in the last 168 hours. Coagulation Profile: No results for input(s): INR, PROTIME in the last 168 hours. Cardiac Enzymes:  Recent Labs Lab 10/30/16 1902 10/30/16 2314 10/31/16 0529  TROPONINI 0.03* 0.04* 0.04*   BNP (last 3 results) No results for input(s): PROBNP in the last 8760 hours. HbA1C: No results for input(s): HGBA1C in the last 72 hours. CBG:  Recent Labs Lab 11/02/16 0004 11/02/16 0330 11/02/16 0737 11/02/16 1140 11/02/16 1718  GLUCAP 119* 104* 102* 133* 113*   Lipid Profile: No results for input(s): CHOL, HDL, LDLCALC, TRIG, CHOLHDL, LDLDIRECT in the last 72 hours. Thyroid Function Tests: No results for input(s): TSH, T4TOTAL, FREET4, T3FREE, THYROIDAB in the last 72 hours. Anemia Panel: No results for input(s): VITAMINB12, FOLATE, FERRITIN, TIBC, IRON, RETICCTPCT in the last 72 hours. Sepsis Labs:  Recent Labs Lab 10/30/16 0016 10/30/16 0455  LATICACIDVEN 14.85* 1.0    Recent Results (from the past 240 hour(s))  MRSA PCR  Screening     Status: Abnormal   Collection Time: 10/30/16  7:11 PM  Result Value Ref Range Status   MRSA by PCR POSITIVE (A) NEGATIVE Final    Comment:        The GeneXpert MRSA Assay (FDA approved for NASAL specimens only), is one component of a comprehensive MRSA colonization surveillance program. It is not intended to diagnose MRSA infection nor to guide or monitor treatment for MRSA infections. RESULT CALLED TO, READ BACK BY AND VERIFIED WITH: A TROXLER RN [phone] 10/31/16 A BROWNING  Radiology Studies: No results found.  Scheduled Meds: . amLODipine  10 mg Oral Daily  . arformoterol  15 mcg Nebulization BID  . budesonide (PULMICORT) nebulizer solution  0.5 mg Nebulization BID  . chlorhexidine  15 mL Mouth Rinse BID  . famotidine  20 mg Oral QHS  . hydrALAZINE  10 mg Oral Q8H  . losartan  100 mg Oral QHS   And  . hydrochlorothiazide  25 mg Oral QHS  . insulin aspart  0-9 Units Subcutaneous Q4H  . levETIRAcetam  500 mg Oral BID  . mupirocin ointment  1 application Nasal BID   Continuous Infusions:   LOS: 3 days   CHIU, Orpah Melter, MD [facility] Pager [phone]  If 7PM-7AM, please contact night-coverage [email or web address] Password [email or web address] 11/02/2016, 6:00 PM

## 2016-11-02 NOTE — Evaluation (Signed)
Physical Therapy Evaluation Patient Details Name: Audi Conover MRN: [id number] DOB: 10-Nov-1945 Today's Date: 11/02/2016   History of Present Illness  71 year old female with PMH as below, which is significant for AF, COPD, HTN, and CVA. She had 2 seizure like periods on 7/31 and presented to [facility] ED. Of note, there is concern for possible undiagnosed vaginal cancer.   Clinical Impression  Pt admitted with above diagnosis. Pt currently with functional limitations due to the deficits listed below (see PT Problem List). Pt confused with decreased safety awareness and awareness of deficits. Fatigues quickly with activity and though only mildly unsteady when first up, becomes very unsafe with fatigue with staggering and need for support.   Pt will benefit from skilled PT to increase their independence and safety with mobility to allow discharge to the venue listed below.       Follow Up Recommendations CIR    Equipment Recommendations  Other (comment) (TBD)    Recommendations for Other Services OT consult;Speech consult (speech for cognition)     Precautions / Restrictions Precautions Precautions: Fall Precaution Comments: taught pt back precautions and log rolling to help decrease back pain Restrictions Weight Bearing Restrictions: No      Mobility  Bed Mobility Overal bed mobility: Needs Assistance Bed Mobility: Rolling;Sidelying to Sit Rolling: Supervision Sidelying to sit: Supervision       General bed mobility comments: pt has had LBP since arrival to hospital, vc's for log rolling which helped pt considerably. Pt moved slowly and with heavy use of rail but no physical assist  Transfers Overall transfer level: Needs assistance Equipment used: None Transfers: Sit to/from Stand Sit to Stand: Min assist         General transfer comment: min A to steady, pt impulsive, unsteady with dynamic activity  Ambulation/Gait Ambulation/Gait assistance: Min  assist Ambulation Distance (Feet): 100 Feet Assistive device: 1 person hand held assist Gait Pattern/deviations: Step-through pattern;Staggering left;Staggering right Gait velocity: decreased Gait velocity interpretation: Below normal speed for age/gender General Gait Details: pt began with min A but no HH but after 50' pt began to fatigue and stagger right and left, needed HHA for return to room.   Stairs            Wheelchair Mobility    Modified Rankin (Stroke Patients Only) Modified Rankin (Stroke Patients Only) Pre-Morbid Rankin Score: No symptoms Modified Rankin: Moderately severe disability     Balance Overall balance assessment: Needs assistance Sitting-balance support: No upper extremity supported Sitting balance-Leahy Scale: Good     Standing balance support: No upper extremity supported Standing balance-Leahy Scale: Fair Standing balance comment: can maintain static stance but loses balance with dynamic activity                             Pertinent Vitals/Pain Pain Assessment: Faces Faces Pain Scale: Hurts little more Pain Location: low back Pain Descriptors / Indicators: Aching;Cramping Pain Intervention(s): Limited activity within patient's tolerance;Monitored during session;Repositioned    Home Living Family/patient expects to be discharged to:: Private residence Living Arrangements: Children Available Help at Discharge: Family;Available PRN/intermittently Type of Home: House Home Access: Stairs to enter Entrance Stairs-Rails: Left Entrance Stairs-Number of Steps: 1 Home Layout: Two level Home Equipment: None Additional Comments: pt reports that her daughter and grandson live with her and that she is alone a lot of the time. History she gives does not completely line up with chart from previous admission  and she is confused so unclear of accurate living situation    Prior Function Level of Independence: Independent          Comments: worked full time until 2 yrs ago. Daughter who lives with her has seizures and does not drive. Pt reports that she was driving PTA     Hand Dominance        Extremity/Trunk Assessment   Upper Extremity Assessment Upper Extremity Assessment: Overall WFL for tasks assessed    Lower Extremity Assessment Lower Extremity Assessment: Generalized weakness    Cervical / Trunk Assessment Cervical / Trunk Assessment: Normal  Communication   Communication: No difficulties  Cognition Arousal/Alertness: Awake/alert Behavior During Therapy: WFL for tasks assessed/performed;Impulsive Overall Cognitive Status: Impaired/Different from baseline Area of Impairment: Memory;Orientation;Safety/judgement;Problem solving;Awareness                 Orientation Level: Disoriented to;Time   Memory: Decreased short-term memory   Safety/Judgement: Decreased awareness of safety;Decreased awareness of deficits Awareness: Emergent Problem Solving: Difficulty sequencing;Requires verbal cues General Comments: pt covering well for cognitive issues but has word finding issues (call hospital a hotel) as well as confusion about time and things going on. She also reports that she has some memory loss at baseline. Could not find her room when returning back down hall.       General Comments General comments (skin integrity, edema, etc.): HR 90 bpm, BP 176/80 after ambulation    Exercises     Assessment/Plan    PT Assessment Patient needs continued PT services  PT Problem List Decreased strength;Decreased activity tolerance;Decreased balance;Decreased mobility;Decreased coordination;Decreased cognition;Decreased knowledge of use of DME;Decreased knowledge of precautions;Decreased safety awareness;Pain       PT Treatment Interventions DME instruction;Gait training;Stair training;Therapeutic activities;Functional mobility training;Therapeutic exercise;Balance training;Cognitive  remediation;Patient/family education    PT Goals (Current goals can be found in the Care Plan section)  Acute Rehab PT Goals Patient Stated Goal: return home PT Goal Formulation: With patient Time For Goal Achievement: 11/16/16 Potential to Achieve Goals: Good    Frequency Min 3X/week   Barriers to discharge Decreased caregiver support;Inaccessible home environment alone a lot, upstairs bedroom    Co-evaluation               AM-PAC PT "6 Clicks" Daily Activity  Outcome Measure Difficulty turning over in bed (including adjusting bedclothes, sheets and blankets)?: A Little Difficulty moving from lying on back to sitting on the side of the bed? : A Little Difficulty sitting down on and standing up from a chair with arms (e.g., wheelchair, bedside commode, etc,.)?: Total Help needed moving to and from a bed to chair (including a wheelchair)?: A Little Help needed walking in hospital room?: A Little Help needed climbing 3-5 steps with a railing? : A Little 6 Click Score: 16    End of Session Equipment Utilized During Treatment: Gait belt Activity Tolerance: Patient tolerated treatment well Patient left: in chair;with call bell/phone within reach;with chair alarm set Nurse Communication: Mobility status PT Visit Diagnosis: Unsteadiness on feet (R26.81);Muscle weakness (generalized) (M62.81);Pain;Other symptoms and signs involving the nervous system (R29.898);Difficulty in walking, not elsewhere classified (R26.2) Pain - part of body:  (low back)    Time: 6294-7654 PT Time Calculation (min) (ACUTE ONLY): 36 min   Charges:   PT Evaluation $PT Eval Moderate Complexity: 1 Mod PT Treatments $Gait Training: 8-22 mins   PT G Codes:        Leighton Roach, PT  Acute Rehab Services  [place] 11/02/2016, 4:25 PM

## 2016-11-02 NOTE — Progress Notes (Addendum)
Notified attending of recent lab Potassium 2.9. Obtained verbal order for PO potassium and another BMP.

## 2016-11-03 LAB — CBC WITH DIFFERENTIAL/PLATELET
Basophils Absolute: 0 10*3/uL (ref 0.0–0.1)
Basophils Relative: 1 %
Eosinophils Absolute: 0.2 10*3/uL (ref 0.0–0.7)
Eosinophils Relative: 2 %
HCT: 41.8 % (ref 36.0–46.0)
Hemoglobin: 14.6 g/dL (ref 12.0–15.0)
Lymphocytes Relative: 22 %
Lymphs Abs: 1.7 10*3/uL (ref 0.7–4.0)
MCH: 31.4 pg (ref 26.0–34.0)
MCHC: 34.9 g/dL (ref 30.0–36.0)
MCV: 89.9 fL (ref 78.0–100.0)
Monocytes Absolute: 0.9 10*3/uL (ref 0.1–1.0)
Monocytes Relative: 12 %
Neutro Abs: 5 10*3/uL (ref 1.7–7.7)
Neutrophils Relative %: 63 %
Platelets: 285 10*3/uL (ref 150–400)
RBC: 4.65 MIL/uL (ref 3.87–5.11)
RDW: 12.2 % (ref 11.5–15.5)
WBC: 7.8 10*3/uL (ref 4.0–10.5)

## 2016-11-03 LAB — GLUCOSE, CAPILLARY
Glucose-Capillary: 115 mg/dL — ABNORMAL HIGH (ref 65–99)
Glucose-Capillary: 115 mg/dL — ABNORMAL HIGH (ref 65–99)
Glucose-Capillary: 155 mg/dL — ABNORMAL HIGH (ref 65–99)

## 2016-11-03 LAB — MAGNESIUM: Magnesium: 1.8 mg/dL (ref 1.7–2.4)

## 2016-11-03 LAB — PHOSPHORUS: Phosphorus: 3.6 mg/dL (ref 2.5–4.6)

## 2016-11-03 MED ORDER — HYDRALAZINE HCL 10 MG PO TABS: 10.0000 mg | ORAL_TABLET | Freq: Three times a day (TID) | ORAL | 0 refills | Status: DC

## 2016-11-03 MED ORDER — METOPROLOL TARTRATE 25 MG PO TABS: 25.0000 mg | ORAL_TABLET | Freq: Two times a day (BID) | ORAL | 0 refills | Status: DC

## 2016-11-03 MED ORDER — LEVETIRACETAM 500 MG PO TABS: 500.0000 mg | ORAL_TABLET | Freq: Two times a day (BID) | ORAL | 0 refills | Status: DC

## 2016-11-03 MED ORDER — FAMOTIDINE 20 MG PO TABS: 20.0000 mg | ORAL_TABLET | Freq: Every day | ORAL | 0 refills | Status: DC

## 2016-11-03 MED ORDER — AMLODIPINE BESYLATE 10 MG PO TABS: 10.0000 mg | ORAL_TABLET | Freq: Every day | ORAL | 0 refills | Status: DC

## 2016-11-03 MED ORDER — METOPROLOL TARTRATE 25 MG PO TABS
25.0000 mg | ORAL_TABLET | Freq: Once | ORAL | Status: AC
  Administered 2016-11-03: 25 mg via ORAL
  Filled 2016-11-03: qty 1

## 2016-11-03 NOTE — Progress Notes (Signed)
Patient continues to become confused as the night goes on and her sbp was 170 at 0111 gave her 10 mg of hydralazine, will recheck at 0230.

## 2016-11-03 NOTE — Discharge Summary (Signed)
Physician Discharge Summary  Kim Blankenship FGH:[id number] DOB: 1946/02/27 DOA: 10/29/2016  PCP: Briscoe Deutscher, MD  Admit date: 10/29/2016 Discharge date: 11/03/2016  Admitted From: Home Disposition:  Home  Patient Left Against Medical Advise  Brief/Interim Summary: 71 y.o.femalewith history of COPD, hypertension, hyperlipidemia paroxysmal atrial fibrillation was brought to the ER patient had a seizure. As per the ER physician Carmell Austria was called at home of the patient was found to be confused. On arrival EMS found patient's pupils were pinpoint and Narcan was given. Necessary patient was witnessed tonic-clonic seizure which lasted for 1 minute and Versed 2.5 IV was given. En route to the ER patient had another seizure. As per the report patient was recently started on tramadol for vaginal sores.  1. PRES with seizures 1. Patient presented with recurrent seizures and acute encephalopathy 2. MRI reviewed, no acute CVA, findings suggestive of PRES 3. Patient was seen by Neurology who recommended starting cardene gtt for aggressive BP control. Patient was moved to ICU where blood pressures were better controlled. Cardene was discontinued and patient was transferred to floor 8/3 4. Pt was started on norvasc by critical care. 5. Blood pressures eventually better controlled with addition of TID PO hydralazine, metoprolol 25mg  bid, and home cozaar 6. Patient was reminded at least 5 times by staff and physician to not drive x 6 months. Patient repeated this back and understands. 2. Hypertensive crisis 1. Per above, patient had required cardene gtt 2. BP trending up since cardene gtt was stopped. Per above 3. Acute toxic-metabolic encephalopathy 1. Initially presented encephalopathic and markedly confused 2. Likely secondary to PRES 3. Mental status now improved 4. On the day of patient leaving against medical advise, patient was alert and oriented x 3, had insight to her medical care. Patient was  competent to make medical decisions.  5. Patient strongly recommended to stay for further blood pressure medication adjustment and consultation for CIR, however, patient was adamant about leaving AMA - citing having to receive money from a family member "in order to survive." Patient's daughter is at bedside. Patient elected to leave against medical advise. 4. Metabolic acidosis/lactic acidosis 1. Presenting lactic acid of just under 15 with repeat lactate of 1.0 2. resolved 5. COPD 1. No wheezing on exam 2. Remains on minimal O2 support 6. P-Afib 1. Stable at present 2. Cardizem was discontinued earlier 3. Heart rate controlled with addition of metoprolol 25mg  bid 7. HLD 1. Stable currently 2. Resume home statin  Discharge Diagnoses:  Principal Problem:   Seizures ([place]) Active Problems:   COPD (chronic obstructive pulmonary disease) (HCC)   Seizure (HCC)   Hypertensive urgency   PAF (paroxysmal atrial fibrillation) (HCC)   PRES (posterior reversible encephalopathy syndrome)    Discharge Instructions  Discharge Instructions    Ambulatory referral to Neurology    Complete by:  As directed    An appointment is requested in approximately: 3 weeks     Allergies as of 11/03/2016   No Known Allergies     Medication List    STOP taking these medications   diltiazem 300 MG 24 hr capsule Commonly known as:  TIAZAC   spironolactone 25 MG tablet Commonly known as:  ALDACTONE     TAKE these medications   acetaminophen 500 MG tablet Commonly known as:  TYLENOL Take 500-1,000 mg by mouth every 6 (six) hours as needed (for pain).   amLODipine 10 MG tablet Commonly known as:  NORVASC Take 1 tablet (10 mg total) by mouth  daily.   atorvastatin 20 MG tablet Commonly known as:  LIPITOR Take 20 mg by mouth daily.   budesonide-formoterol 160-4.5 MCG/ACT inhaler Commonly known as:  SYMBICORT Inhale 2 puffs into the lungs 2 (two) times daily.   citalopram 10 MG  tablet Commonly known as:  CELEXA Take 10 mg by mouth daily.   famotidine 20 MG tablet Commonly known as:  PEPCID Take 1 tablet (20 mg total) by mouth at bedtime.   hydrALAZINE 10 MG tablet Commonly known as:  APRESOLINE Take 1 tablet (10 mg total) by mouth every 8 (eight) hours.   ibuprofen 200 MG tablet Commonly known as:  ADVIL,MOTRIN Take 200-400 mg by mouth every 6 (six) hours as needed for headache or mild pain.   levETIRAcetam 500 MG tablet Commonly known as:  KEPPRA Take 1 tablet (500 mg total) by mouth 2 (two) times daily.   losartan-hydrochlorothiazide 100-25 MG tablet Commonly known as:  HYZAAR Take 1 tablet by mouth daily.   metoprolol tartrate 25 MG tablet Commonly known as:  LOPRESSOR Take 1 tablet (25 mg total) by mouth 2 (two) times daily.   multivitamin tablet Take 1 tablet by mouth daily with breakfast.      Follow-up Information    Briscoe Deutscher, MD. Schedule an appointment as soon as possible for a visit in 1 week(s).   Specialty:  Family Medicine Contact information: [address] [place] [address] [phone]        Follow up with Neurology as scheduled Follow up.          No Known Allergies  Consultations:  Critical care  Neurology  Procedures/Studies: Ct Head Wo Contrast  Result Date: 10/30/2016 CLINICAL DATA:  Initial evaluation for acute altered mental status, seizure. EXAM: CT HEAD WITHOUT CONTRAST TECHNIQUE: Contiguous axial images were obtained from the base of the skull through the vertex without intravenous contrast. COMPARISON:  Prior CT from 04/05/2016. FINDINGS: Brain: Cerebral volume within normal limits, and stable from previous. There is subtle patchy hypodensity within the parasagittal left parieto-occipital region (series 3, image 23), not definitely seen on previous. Additional possible vague hypodensity within the adjacent parasagittal right parieto-occipital lobe as well. Findings are nonspecific, but  could reflect sequelae of PRES given history of seizure. Underlying mass not excluded. Few additional scattered subcortical hypodensities involving the bilateral frontal lobes are grossly similar to previous, and may be related underlying chronic microvascular ischemic changes. No acute intracranial hemorrhage. No other evidence for acute large vessel territory infarct. No other mass lesion or midline shift. No hydrocephalus. No extra-axial fluid collection. Vascular: No hyperdense vessel. Scattered vascular calcifications noted within the carotid siphons. Skull: Scalp soft tissues and calvarium within normal limits. Sinuses/Orbits: Globes and orbital soft tissues within normal limits. Mild scattered mucosal thickening within the left sphenoid and maxillary sinuses as well the ethmoidal air cells. No mastoid effusion. IMPRESSION: Patchy hypodensity within the parasagittal left parietal lobe, new from prior. Given the history of seizure, finding may reflect sequelae of PRES. Underlying mass not entirely excluded. Further evaluation with brain MRI, with and without gadolinium, recommended. Findings discussed by telephone at the time of interpretation on 10/30/2016 at approximately 1:45 a.m. with Dr. Kathrynn Speed. Electronically Signed   By: Jeannine Boga M.D.   On: 10/30/2016 02:04   Mr Brain W And Wo Contrast  Result Date: 10/30/2016 CLINICAL DATA:  Initial evaluation for acute seizure. EXAM: MRI HEAD WITHOUT AND WITH CONTRAST TECHNIQUE: Multiplanar, multiecho pulse sequences of the brain  and surrounding structures were obtained without and with intravenous contrast. CONTRAST:  41mL MULTIHANCE GADOBENATE DIMEGLUMINE 529 MG/ML IV SOLN COMPARISON:  Prior head CT from earlier the same day. FINDINGS: Brain: Cerebral volume within normal limits for age. Patchy T2/FLAIR hyperintensity involving the periventricular and deep white matter both cerebral hemispheres most consistent with chronic small vessel  ischemic disease, overall stable from previous. Multifocal patchy T2/FLAIR signal abnormality seen involving the cortical gray matter and underlying subcortical deep white matter of the bilateral parieto-occipital regions in a distribution most consistent with PRES. Associated mild patchy diffusion abnormality. No associated hemorrhage or abnormal enhancement. No other evidence for acute infarct. No mass lesion, midline shift or mass effect. No hydrocephalus. No extra-axial fluid collection. Major dural sinuses are patent. No other abnormal enhancement. Pituitary gland suprasellar region within normal limits. Vascular: Major intracranial vascular flow voids are maintained. Skull and upper cervical spine: Cerebellar tonsils mildly low lying within the foramen magnum without frank Chiari malformation. Mild degenerate spondylolysis noted within upper cervical spine without significant stenosis. Bone marrow signal intensity within normal limits. No scalp soft tissue abnormality. Sinuses/Orbits: Globes and orbital soft tissues within normal limits. Mild scattered mucosal thickening within the paranasal sinuses. Air-fluid level noted within the left sphenoid sinus. No significant mastoid effusion. Inner ear structures within normal limits. IMPRESSION: 1. Findings consistent with PRES as above. 2. Otherwise stable underlying chronic microvascular ischemic disease. 3. Acute left sphenoid sinusitis. Electronically Signed   By: Jeannine Boga M.D.   On: 10/30/2016 06:08   Dg Chest Portable 1 View  Result Date: 10/30/2016 CLINICAL DATA:  Altered mental status EXAM: PORTABLE CHEST 1 VIEW COMPARISON:  04/05/2016 FINDINGS: Shallow inspiration. Heart size and pulmonary vascularity are normal. Probable emphysematous changes in the lungs. No airspace disease or consolidation. No blunting of costophrenic angles. No pneumothorax. Calcified and tortuous aorta. IMPRESSION: Shallow inspiration. Probable emphysematous changes in  the lungs. Aortic atherosclerosis. No active pulmonary disease. Electronically Signed   By: Lucienne Capers M.D.   On: 10/30/2016 00:45    Subjective: Wanting to leave  Discharge Exam: Vitals:   11/03/16 0917 11/03/16 1324  BP: (!) 144/73 (!) 157/85  Pulse: 84 85  Resp: 20 20  Temp: 98.4 F (36.9 C) 97.7 F (36.5 C)   Vitals:   11/03/16 0234 11/03/16 0500 11/03/16 0917 11/03/16 1324  BP: (!) 187/83 (!) 160/74 (!) 144/73 (!) 157/85  Pulse: 98 92 84 85  Resp:  20 20 20   Temp:  98.1 F (36.7 C) 98.4 F (36.9 C) 97.7 F (36.5 C)  TempSrc:  Oral Oral Oral  SpO2:  97% 95% 98%  Weight:      Height:        General: Pt is alert, awake, not in acute distress Cardiovascular: RRR, S1/S2 +, no rubs, no gallops Respiratory: CTA bilaterally, no wheezing, no rhonchi Abdominal: Soft, NT, ND, bowel sounds + Extremities: no edema, no cyanosis   The results of significant diagnostics from this hospitalization (including imaging, microbiology, ancillary and laboratory) are listed below for reference.     Microbiology: Recent Results (from the past 240 hour(s))  MRSA PCR Screening     Status: Abnormal   Collection Time: 10/30/16  7:11 PM  Result Value Ref Range Status   MRSA by PCR POSITIVE (A) NEGATIVE Final    Comment:        The GeneXpert MRSA Assay (FDA approved for NASAL specimens only), is one component of a comprehensive MRSA colonization surveillance program. It is  not intended to diagnose MRSA infection nor to guide or monitor treatment for MRSA infections. RESULT CALLED TO, READ BACK BY AND VERIFIED WITH: A TROXLER RN [id number] 10/31/16 A BROWNING      Labs: BNP (last 3 results) No results for input(s): BNP in the last 8760 hours. Basic Metabolic Panel:  Recent Labs Lab 10/30/16 0010 10/30/16 0455 10/30/16 1902 10/31/16 0529 11/02/16 0012 11/02/16 0743 11/03/16 0434  NA 129* 130*  --  139 140 141  --   K 3.7 2.9*  --  3.4* 2.9* 3.3*  --   CL 89* 99*  --   108 106 108  --   CO2 17* 24  --  21* 27 26  --   GLUCOSE 242* 107*  --  94 120* 112*  --   BUN 8 7  --  6 <5* <5*  --   CREATININE 1.05* 0.70  --  0.72 0.65 0.61  --   CALCIUM 9.7 8.5*  --  8.6* 8.9 8.9  --   MG  --   --  2.0 1.8 1.7  --  1.8  PHOS  --   --   --  2.8 2.2*  --  3.6   Liver Function Tests:  Recent Labs Lab 10/30/16 0010 10/30/16 0455 10/31/16 0529  AST 54* 32  --   ALT 35 27  --   ALKPHOS 87 58  --   BILITOT 1.2 0.9  --   PROT 8.0 5.7*  --   ALBUMIN 4.7 3.5 3.2*   No results for input(s): LIPASE, AMYLASE in the last 168 hours. No results for input(s): AMMONIA in the last 168 hours. CBC:  Recent Labs Lab 10/30/16 0010 10/30/16 0455 10/31/16 0529 11/02/16 0012 11/03/16 0434  WBC 15.3* 15.7* 10.8* 10.4 7.8  NEUTROABS 12.2* 13.4*  --  8.3* 5.0  HGB 16.2* 13.9 15.2* 13.6 14.6  HCT 45.5 38.5 43.3 39.0 41.8  MCV 90.8 88.1 91.4 89.7 89.9  PLT 392 282 243 242 285   Cardiac Enzymes:  Recent Labs Lab 10/30/16 1902 10/30/16 2314 10/31/16 0529  TROPONINI 0.03* 0.04* 0.04*   BNP: Invalid input(s): POCBNP CBG:  Recent Labs Lab 11/02/16 2010 11/02/16 2358 11/03/16 0414 11/03/16 0816 11/03/16 1117  GLUCAP 160* 96 115* 115* 155*   D-Dimer No results for input(s): DDIMER in the last 72 hours. Hgb A1c No results for input(s): HGBA1C in the last 72 hours. Lipid Profile No results for input(s): CHOL, HDL, LDLCALC, TRIG, CHOLHDL, LDLDIRECT in the last 72 hours. Thyroid function studies No results for input(s): TSH, T4TOTAL, T3FREE, THYROIDAB in the last 72 hours.  Invalid input(s): FREET3 Anemia work up No results for input(s): VITAMINB12, FOLATE, FERRITIN, TIBC, IRON, RETICCTPCT in the last 72 hours. Urinalysis    Component Value Date/Time   COLORURINE YELLOW 10/30/2016 0023   APPEARANCEUR CLEAR 10/30/2016 0023   LABSPEC 1.014 10/30/2016 0023   PHURINE 6.0 10/30/2016 0023   GLUCOSEU NEGATIVE 10/30/2016 0023   HGBUR NEGATIVE 10/30/2016 0023    BILIRUBINUR NEGATIVE 10/30/2016 0023   KETONESUR 20 (A) 10/30/2016 0023   PROTEINUR 100 (A) 10/30/2016 0023   NITRITE NEGATIVE 10/30/2016 0023   LEUKOCYTESUR NEGATIVE 10/30/2016 0023   Sepsis Labs Invalid input(s): PROCALCITONIN,  WBC,  LACTICIDVEN Microbiology Recent Results (from the past 240 hour(s))  MRSA PCR Screening     Status: Abnormal   Collection Time: 10/30/16  7:11 PM  Result Value Ref Range Status   MRSA by PCR POSITIVE (A) NEGATIVE  Final    Comment:        The GeneXpert MRSA Assay (FDA approved for NASAL specimens only), is one component of a comprehensive MRSA colonization surveillance program. It is not intended to diagnose MRSA infection nor to guide or monitor treatment for MRSA infections. RESULT CALLED TO, READ BACK BY AND VERIFIED WITH: A TROXLER RN 1642 10/31/16 A BROWNING      SIGNED:   Donne Hazel, MD  [facility] 11/03/2016, 4:26 PM  If 7PM-7AM, please contact night-coverage [email or web address] Password [email or web address]

## 2016-11-03 NOTE — Discharge Instructions (Signed)
°  DO NOT DRIVE FOR SIX (6) MONTHS OR UNTIL CLEARED BY YOUR PCP.

## 2016-11-03 NOTE — Progress Notes (Signed)
Pt left AMA.  Pt and daughter educated on the need to wait for doctors orders to discharge.  Pt insisting the need to leave.  Pt told repeatedly not to drive for six months or until cleared by her doctor.  AMA papers signed and placed in patients chart.  Cori Razor, RN

## 2016-11-04 ENCOUNTER — Inpatient Hospital Stay (HOSPITAL_COMMUNITY)
Admission: EM | Admit: 2016-11-04 | Discharge: 2016-11-06 | DRG: 304 | Disposition: A | Discharge status: Home Health | Payer: Medicare Other | Attending: Internal Medicine | Admitting: Internal Medicine

## 2016-11-04 ENCOUNTER — Encounter (HOSPITAL_COMMUNITY): Payer: Self-pay | Admitting: Emergency Medicine

## 2016-11-04 DIAGNOSIS — I161 Hypertensive emergency: Principal | ICD-10-CM

## 2016-11-04 DIAGNOSIS — Z9119 Patient's noncompliance with other medical treatment and regimen: Secondary | ICD-10-CM | POA: Diagnosis not present

## 2016-11-04 DIAGNOSIS — R64 Cachexia: Secondary | ICD-10-CM | POA: Diagnosis present

## 2016-11-04 DIAGNOSIS — R739 Hyperglycemia, unspecified: Secondary | ICD-10-CM | POA: Diagnosis present

## 2016-11-04 DIAGNOSIS — J449 Chronic obstructive pulmonary disease, unspecified: Secondary | ICD-10-CM | POA: Diagnosis present

## 2016-11-04 DIAGNOSIS — I6783 Posterior reversible encephalopathy syndrome: Secondary | ICD-10-CM

## 2016-11-04 DIAGNOSIS — Z8673 Personal history of transient ischemic attack (TIA), and cerebral infarction without residual deficits: Secondary | ICD-10-CM

## 2016-11-04 DIAGNOSIS — E785 Hyperlipidemia, unspecified: Secondary | ICD-10-CM | POA: Diagnosis present

## 2016-11-04 DIAGNOSIS — R9431 Abnormal electrocardiogram [ECG] [EKG]: Secondary | ICD-10-CM | POA: Diagnosis not present

## 2016-11-04 DIAGNOSIS — K219 Gastro-esophageal reflux disease without esophagitis: Secondary | ICD-10-CM | POA: Diagnosis present

## 2016-11-04 DIAGNOSIS — Z7951 Long term (current) use of inhaled steroids: Secondary | ICD-10-CM

## 2016-11-04 DIAGNOSIS — Z96641 Presence of right artificial hip joint: Secondary | ICD-10-CM | POA: Diagnosis present

## 2016-11-04 DIAGNOSIS — F1020 Alcohol dependence, uncomplicated: Secondary | ICD-10-CM | POA: Diagnosis present

## 2016-11-04 DIAGNOSIS — R569 Unspecified convulsions: Secondary | ICD-10-CM

## 2016-11-04 DIAGNOSIS — I480 Paroxysmal atrial fibrillation: Secondary | ICD-10-CM | POA: Diagnosis present

## 2016-11-04 DIAGNOSIS — F329 Major depressive disorder, single episode, unspecified: Secondary | ICD-10-CM | POA: Diagnosis present

## 2016-11-04 DIAGNOSIS — I10 Essential (primary) hypertension: Secondary | ICD-10-CM

## 2016-11-04 DIAGNOSIS — I6389 Other cerebral infarction: Secondary | ICD-10-CM

## 2016-11-04 HISTORY — DX: Posterior reversible encephalopathy syndrome: I67.83

## 2016-11-04 LAB — CBC WITH DIFFERENTIAL/PLATELET
Basophils Absolute: 0.1 10*3/uL (ref 0.0–0.1)
Basophils Relative: 1 %
Eosinophils Absolute: 0.3 10*3/uL (ref 0.0–0.7)
Eosinophils Relative: 4 %
HCT: 40 % (ref 36.0–46.0)
Hemoglobin: 13.9 g/dL (ref 12.0–15.0)
Lymphocytes Relative: 30 %
Lymphs Abs: 2.3 10*3/uL (ref 0.7–4.0)
MCH: 31.5 pg (ref 26.0–34.0)
MCHC: 34.8 g/dL (ref 30.0–36.0)
MCV: 90.7 fL (ref 78.0–100.0)
Monocytes Absolute: 0.9 10*3/uL (ref 0.1–1.0)
Monocytes Relative: 11 %
Neutro Abs: 4.3 10*3/uL (ref 1.7–7.7)
Neutrophils Relative %: 54 %
Platelets: 279 10*3/uL (ref 150–400)
RBC: 4.41 MIL/uL (ref 3.87–5.11)
RDW: 12 % (ref 11.5–15.5)
WBC: 7.8 10*3/uL (ref 4.0–10.5)

## 2016-11-04 LAB — URINALYSIS, ROUTINE W REFLEX MICROSCOPIC
Bilirubin Urine: NEGATIVE
Glucose, UA: NEGATIVE mg/dL
Hgb urine dipstick: NEGATIVE
Ketones, ur: NEGATIVE mg/dL
Nitrite: NEGATIVE
Protein, ur: NEGATIVE mg/dL
Specific Gravity, Urine: 1.012 (ref 1.005–1.030)
pH: 6 (ref 5.0–8.0)

## 2016-11-04 LAB — BASIC METABOLIC PANEL
Anion gap: 11 (ref 5–15)
BUN: 15 mg/dL (ref 6–20)
CO2: 25 mmol/L (ref 22–32)
Calcium: 9.4 mg/dL (ref 8.9–10.3)
Chloride: 99 mmol/L — ABNORMAL LOW (ref 101–111)
Creatinine, Ser: 0.79 mg/dL (ref 0.44–1.00)
GFR calc Af Amer: >60 mL/min (ref >60)
GFR calc non Af Amer: >60 mL/min (ref >60)
Glucose, Bld: 126 mg/dL — ABNORMAL HIGH (ref 65–99)
Potassium: 3.6 mmol/L (ref 3.5–5.1)
Sodium: 135 mmol/L (ref 135–145)

## 2016-11-04 LAB — TROPONIN I: Troponin I: <0.03 ng/mL (ref <0.03)

## 2016-11-04 LAB — RAPID URINE DRUG SCREEN, HOSP PERFORMED
Amphetamines: NOT DETECTED
Barbiturates: NOT DETECTED
Benzodiazepines: NOT DETECTED
Cocaine: NOT DETECTED
Opiates: NOT DETECTED
Tetrahydrocannabinol: NOT DETECTED

## 2016-11-04 LAB — I-STAT TROPONIN, ED: Troponin i, poc: 0 ng/mL (ref 0.00–0.08)

## 2016-11-04 LAB — ETHANOL: Alcohol, Ethyl (B): <5 mg/dL (ref <5)

## 2016-11-04 MED ORDER — LEVETIRACETAM 500 MG PO TABS
500.0000 mg | ORAL_TABLET | Freq: Two times a day (BID) | ORAL | Status: DC
  Administered 2016-11-04 – 2016-11-06 (×4): 500 mg via ORAL
  Filled 2016-11-04 (×4): qty 1

## 2016-11-04 MED ORDER — METOPROLOL TARTRATE 25 MG PO TABS
25.0000 mg | ORAL_TABLET | Freq: Once | ORAL | Status: AC
  Administered 2016-11-04: 25 mg via ORAL
  Filled 2016-11-04: qty 1

## 2016-11-04 MED ORDER — METOPROLOL TARTRATE 25 MG PO TABS: 25.0000 mg | ORAL_TABLET | Freq: Two times a day (BID) | ORAL | Status: DC

## 2016-11-04 MED ORDER — HYDRALAZINE HCL 10 MG PO TABS
10.0000 mg | ORAL_TABLET | Freq: Three times a day (TID) | ORAL | Status: DC
  Administered 2016-11-04 – 2016-11-05 (×2): 10 mg via ORAL
  Filled 2016-11-04 (×4): qty 1

## 2016-11-04 MED ORDER — ATORVASTATIN CALCIUM 20 MG PO TABS
20.0000 mg | ORAL_TABLET | Freq: Every day | ORAL | Status: DC
  Administered 2016-11-04 – 2016-11-06 (×3): 20 mg via ORAL
  Filled 2016-11-04 (×3): qty 1

## 2016-11-04 MED ORDER — ACETAMINOPHEN 650 MG RE SUPP: 650.0000 mg | Freq: Four times a day (QID) | RECTAL | Status: DC | PRN

## 2016-11-04 MED ORDER — SODIUM CHLORIDE 0.9 % IV SOLN
1000.0000 mg | Freq: Once | INTRAVENOUS | Status: AC
  Administered 2016-11-04: 1000 mg via INTRAVENOUS
  Filled 2016-11-04: qty 10

## 2016-11-04 MED ORDER — MOMETASONE FURO-FORMOTEROL FUM 200-5 MCG/ACT IN AERO
2.0000 | INHALATION_SPRAY | Freq: Two times a day (BID) | RESPIRATORY_TRACT | Status: DC
  Administered 2016-11-05 – 2016-11-06 (×2): 2 via RESPIRATORY_TRACT
  Filled 2016-11-04 (×2): qty 8.8

## 2016-11-04 MED ORDER — AMLODIPINE BESYLATE 5 MG PO TABS: 10.0000 mg | ORAL_TABLET | Freq: Every day | ORAL | Status: DC

## 2016-11-04 MED ORDER — METOPROLOL TARTRATE 25 MG PO TABS
25.0000 mg | ORAL_TABLET | Freq: Two times a day (BID) | ORAL | Status: DC
  Administered 2016-11-04 – 2016-11-06 (×4): 25 mg via ORAL
  Filled 2016-11-04 (×4): qty 1

## 2016-11-04 MED ORDER — CITALOPRAM HYDROBROMIDE 20 MG PO TABS
10.0000 mg | ORAL_TABLET | Freq: Every day | ORAL | Status: DC
  Administered 2016-11-04 – 2016-11-06 (×3): 10 mg via ORAL
  Filled 2016-11-04 (×3): qty 1

## 2016-11-04 MED ORDER — HYDRALAZINE HCL 10 MG PO TABS
10.0000 mg | ORAL_TABLET | Freq: Once | ORAL | Status: AC
  Administered 2016-11-04: 10 mg via ORAL
  Filled 2016-11-04: qty 1

## 2016-11-04 MED ORDER — LOSARTAN POTASSIUM 50 MG PO TABS
100.0000 mg | ORAL_TABLET | Freq: Once | ORAL | Status: AC
  Administered 2016-11-04: 100 mg via ORAL
  Filled 2016-11-04: qty 2

## 2016-11-04 MED ORDER — HYDROCHLOROTHIAZIDE 25 MG PO TABS
25.0000 mg | ORAL_TABLET | Freq: Once | ORAL | Status: AC
  Administered 2016-11-04: 25 mg via ORAL
  Filled 2016-11-04: qty 1

## 2016-11-04 MED ORDER — LOSARTAN POTASSIUM 50 MG PO TABS
100.0000 mg | ORAL_TABLET | Freq: Every day | ORAL | Status: DC
  Administered 2016-11-05 – 2016-11-06 (×2): 100 mg via ORAL
  Filled 2016-11-04 (×2): qty 2

## 2016-11-04 MED ORDER — ACETAMINOPHEN 325 MG PO TABS
650.0000 mg | ORAL_TABLET | Freq: Four times a day (QID) | ORAL | Status: DC | PRN
  Administered 2016-11-05 (×2): 650 mg via ORAL
  Filled 2016-11-04 (×2): qty 2

## 2016-11-04 MED ORDER — ONDANSETRON HCL 4 MG PO TABS: 4.0000 mg | ORAL_TABLET | Freq: Four times a day (QID) | ORAL | Status: DC | PRN

## 2016-11-04 MED ORDER — ONDANSETRON HCL 4 MG/2ML IJ SOLN: 4.0000 mg | Freq: Four times a day (QID) | INTRAMUSCULAR | Status: DC | PRN

## 2016-11-04 MED ORDER — ENOXAPARIN SODIUM 40 MG/0.4ML ~~LOC~~ SOLN
40.0000 mg | SUBCUTANEOUS | Status: DC
  Administered 2016-11-04 – 2016-11-05 (×2): 40 mg via SUBCUTANEOUS
  Filled 2016-11-04 (×3): qty 0.4

## 2016-11-04 MED ORDER — HYDRALAZINE HCL 10 MG PO TABS: 10.0000 mg | ORAL_TABLET | Freq: Three times a day (TID) | ORAL | Status: DC

## 2016-11-04 MED ORDER — LOSARTAN POTASSIUM-HCTZ 100-25 MG PO TABS: 1.0000 | ORAL_TABLET | Freq: Every day | ORAL | Status: DC

## 2016-11-04 MED ORDER — AMLODIPINE BESYLATE 5 MG PO TABS
10.0000 mg | ORAL_TABLET | Freq: Once | ORAL | Status: AC
  Administered 2016-11-04: 10 mg via ORAL
  Filled 2016-11-04: qty 2

## 2016-11-04 MED ORDER — FAMOTIDINE 20 MG PO TABS
20.0000 mg | ORAL_TABLET | Freq: Every day | ORAL | Status: DC
  Administered 2016-11-04 – 2016-11-05 (×2): 20 mg via ORAL
  Filled 2016-11-04 (×2): qty 1

## 2016-11-04 NOTE — ED Notes (Signed)
Pt ambulated to bathroom without difficulty.

## 2016-11-04 NOTE — ED Notes (Signed)
Asked pt for urine sample for U/A and UDS. Pt states she cannot void currently.

## 2016-11-04 NOTE — ED Notes (Signed)
Sent pharmacy a note to verify/send scheduled meds.

## 2016-11-04 NOTE — ED Notes (Signed)
Pt's O2 reading 90% placed pt on 2L nasal cannula. Nurse was notified.

## 2016-11-04 NOTE — ED Triage Notes (Signed)
Pt in from [facility] with dizziness and seizure at 0230 (witnessed by daughter). EMS states pt left AMA yesterday from 78[facility] - was admitted for seizures. Pt states she had to leave to pay bill, wants to be checked in today for continued treatment. Pt a&ox3, poor historian. VSS 145/100, 93% RA, CBG 110. MAE's equally, hx of "stroke 1 wk ago" per pt, also stroke last year s/p hip surgery.

## 2016-11-04 NOTE — ED Provider Notes (Signed)
[facility] DEPT Provider Note   CSN: [id number] Arrival date & time: 11/04/16  0915     History   Chief Complaint Chief Complaint  Patient presents with  . Seizures  . Dizziness    HPI Kim Blankenship is a 71 y.o. female.  The history is provided by the patient and medical records.  Seizures    Dizziness    71 y.o. F with hx of asthma, AFIB, COPD, HTN, hx of stroke, Presenting to the ED with generalized weakness, lightheadedness, and reported seizure this morning around 2:30. Patient is a very poor historian, she provides limited details. Apparently EMS was called out today due to some seizure-like activity this morning. Patient signed out AMA from the hospital yesterday after an admission for PRES.  states she needed to leave in order to "pay some bills" as the bank would not let her transfer money otherwise. States she was going to see how she did over the next 24 hours before she decided she wanted to come back. States she did not receive prescriptions for any of her medications and therefore did not get them filled. States since leaving the hospital she has been generally weak and fatigued. States she is lightheaded, mostly when she tries to stand up and move around. She denies any falls or trauma. No syncopal events.  She denies any new focal numbness or weakness. Her only other complaint is her legs feeling "restless" which has been ongoing for a while now.  Past Medical History:  Diagnosis Date  . Asthma   . Atrial fibrillation ([place])   . COPD (chronic obstructive pulmonary disease) ([place])   . Hypertension   . Irregular heart rate   . Stroke [facility])     Patient Active Problem List   Diagnosis Date Noted  . Seizures ([place]) 10/30/2016  . Hypertensive urgency 10/30/2016  . PAF (paroxysmal atrial fibrillation) ([place]) 10/30/2016  . PRES (posterior reversible encephalopathy syndrome) 10/30/2016  . Seizure ([place])   . Acute blood loss anemia   . Closed fracture of right hip  ([place]) 02/09/2016  . Chest pain 11/07/2015  . COPD (chronic obstructive pulmonary disease) ([place]) 08/19/2013  . COPD exacerbation ([place]) 08/19/2013  . Abnormal EKG 08/19/2013  . Fever, unspecified 08/19/2013    Past Surgical History:  Procedure Laterality Date  . CESAREAN SECTION    . Left Foot Reconstruction    . OPEN REDUCTION INTERNAL FIXATION (ORIF) METACARPAL Right 03/10/2015   Procedure: OPEN REDUCTION INTERNAL FIXATION (ORIF) RIGHT LONG  METACARPAL FRACTURE;  Surgeon: Leanora Cover, MD;  Location: [facility];  Service: Orthopedics;  Laterality: Right;  . TONSILLECTOMY    . TOTAL HIP ARTHROPLASTY Right 02/09/2016   Procedure: TOTAL HIP ARTHROPLASTY ANTERIOR APPROACH;  Surgeon: Frederik Pear, MD;  Location: [facility];  Service: Orthopedics;  Laterality: Right;    OB History    No data available       Home Medications    Prior to Admission medications   Medication Sig Start Date End Date Taking? Authorizing Provider  acetaminophen (TYLENOL) 500 MG tablet Take 500-1,000 mg by mouth every 6 (six) hours as needed (for pain).    [provider]  amLODipine (NORVASC) 10 MG tablet Take 1 tablet (10 mg total) by mouth daily. 11/04/16   Donne Hazel, MD  atorvastatin (LIPITOR) 20 MG tablet Take 20 mg by mouth daily. 04/08/16   [provider]  budesonide-formoterol (SYMBICORT) 160-4.5 MCG/ACT inhaler Inhale 2 puffs into the lungs 2 (two) times  daily.    [provider]  citalopram (CELEXA) 10 MG tablet Take 10 mg by mouth daily.    [provider]  famotidine (PEPCID) 20 MG tablet Take 1 tablet (20 mg total) by mouth at bedtime. 11/03/16   Donne Hazel, MD  hydrALAZINE (APRESOLINE) 10 MG tablet Take 1 tablet (10 mg total) by mouth every 8 (eight) hours. 11/03/16   Donne Hazel, MD  ibuprofen (ADVIL,MOTRIN) 200 MG tablet Take 200-400 mg by mouth every 6 (six) hours as needed for headache or mild pain.     [provider]  levETIRAcetam  (KEPPRA) 500 MG tablet Take 1 tablet (500 mg total) by mouth 2 (two) times daily. 11/03/16   Donne Hazel, MD  losartan-hydrochlorothiazide (HYZAAR) 100-25 MG tablet Take 1 tablet by mouth daily.  01/09/16   [provider]  metoprolol tartrate (LOPRESSOR) 25 MG tablet Take 1 tablet (25 mg total) by mouth 2 (two) times daily. 11/03/16   Donne Hazel, MD  Multiple Vitamin (MULTIVITAMIN) tablet Take 1 tablet by mouth daily with breakfast.     [provider]    Family History Family History  Problem Relation Age of Onset  . Dementia Father     Social History Social History  Substance Use Topics  . Smoking status: Former Smoker    Types: Cigarettes  . Smokeless tobacco: Never Used  . Alcohol use Yes     Comment: wine daily     Allergies   Patient has no known allergies.   Review of Systems Review of Systems  Neurological: Positive for seizures and light-headedness.  All other systems reviewed and are negative.    Physical Exam Updated Vital Signs BP 135/80   Pulse 71   Temp 97.9 F (36.6 C) (Oral)   Resp (!) 8   Ht 5\' 2"  (1.575 m)   Wt 61.7 kg (136 lb)   SpO2 97%   BMI 24.87 kg/m   Physical Exam  Constitutional: She is oriented to person, place, and time. She appears well-developed and well-nourished.  HENT:  Head: Normocephalic and atraumatic.  Mouth/Throat: Oropharynx is clear and moist.  Eyes: Pupils are equal, round, and reactive to light. Conjunctivae and EOM are normal.  Neck: Normal range of motion.  Cardiovascular: Normal rate, regular rhythm and normal heart sounds.   Pulmonary/Chest: Effort normal and breath sounds normal. No respiratory distress. She has no wheezes.  Abdominal: Soft. Bowel sounds are normal. There is no tenderness. There is no rebound.  Musculoskeletal: Normal range of motion.  Neurological: She is alert and oriented to person, place, and time.  AAOx3, answering questions mostly appropriate but easily mixing up  some details (confusing oxygen tank with inhaler); equal strength UE and LE bilaterally; CN grossly intact; moves all extremities appropriately without ataxia; no focal neuro deficits or facial asymmetry appreciated; speech is clear, no slurring noted  Skin: Skin is warm and dry.  Psychiatric: She has a normal mood and affect.  Nursing note and vitals reviewed.    ED Treatments / Results  Labs (all labs ordered are listed, but only abnormal results are displayed) Labs Reviewed  BASIC METABOLIC PANEL - Abnormal; Notable for the following:       Result Value   Chloride 99 (*)    Glucose, Bld 126 (*)    All other components within normal limits  CBC WITH DIFFERENTIAL/PLATELET  ETHANOL  URINALYSIS, ROUTINE W REFLEX MICROSCOPIC  RAPID URINE DRUG SCREEN, HOSP PERFORMED  I-STAT  TROPONIN, ED    EKG  EKG Interpretation None       Radiology No results found.  Procedures Procedures (including critical care time)  Medications Ordered in ED Medications  levETIRAcetam (KEPPRA) 1,000 mg in sodium chloride 0.9 % 100 mL IVPB (not administered)     Initial Impression / Assessment and Plan / ED Course  I have reviewed the triage vital signs and the nursing notes.  Pertinent labs & imaging results that were available during my care of the patient were reviewed by me and considered in my medical decision making (see chart for details).  71 year old female here for seizure activity. Patient left AMA from the hospital yesterday after an admission for PRES.  Patient stated she needed to leave to "go pay some bills".  Apparently had seizure around 2:30 AM this morning witnessed by her daughter. Patient thought initially she would be okay at home but continued to feel more weak and lightheaded so she returned here. She is not had any medications since leaving the hospital yesterday. Blood pressure currently 135/80. Her neurologic exam is nonfocal. Her speech is overall clear, she does have some  issue recalling fine details and is mixing up common items within her history.  She does not appear acutely encephalopathic at present. She denies any chest pain or shortness of breath. Patient will be loaded with IV Keppra given seizure this morning. Will repeat lab work.  Labwork overall reassuring. Patient's blood pressure has steadily trended up during her ED stay. Her home blood pressure medications have been ordered. She continues to feel weak. Neurologic exam is unchanged. She is not had any further seizure activity here. At this point, feel patient will likely need repeat admission for continued blood pressure control given her recurrent seizure.  Case discussed with hospitalist, Dr. Marily Memos-- will admit for ongoing care.    Final Clinical Impressions(s) / ED Diagnoses   Final diagnoses:  Seizure [facility])  Essential hypertension    New Prescriptions New Prescriptions   No medications on file     Larene Pickett, PA-C 11/04/16 1215    Malvin Johns, MD 11/05/16 [phone]

## 2016-11-04 NOTE — H&P (Signed)
History and Physical    Kim Blankenship VOH:[id number] DOB: 04-08-1945 DOA: 11/04/2016  PCP: Briscoe Deutscher, MD Patient coming from: home  Chief Complaint: Confusion  HPI: Kim Blankenship is a 71 y.o. female with medical history significant of hypertension,PRES, A. fib, COPD, CVA.  Level V caveat applies as patient continues to present with some mild confusion and her history of events leading up to her admission orally those which she was told by her daughter who lives with her. Patient currently struggling with understanding numbers, for example having difficulty writing phone numbers.   Pt reports needing to leave the hospital the day prior to current admission due to having to get money to give to her daughter. Patient states that the only way she could do this was by going to [facility] to withdraw all the money and that no one else was able to do this. Patient states she was unaware of the fact that she could come back to the hospital to be seen in emergency room for readmission if needed. Patient states that she had some palpitations the day prior to readmission but otherwise went to bed feeling normal. Patient's daughter sleeps in the same room and reports that patient had a confusional episode that started at 02:30. Urgency this was described the seizure but there is no actual seizure type activity described. Patient was described as talking gibberish, and confused. Patient states that she felt like her "brain was gone. "  Patient originally states that symptoms started later in the morning and continued until approximately noon. Patient states that after her initial episode of acting on being confused she sat there staring at the wall for any prolonged period of time. Other family members went back to sleep per report. Patient reports feeling and acting very agitated throughout the night. Patient is unsure if she went back to sleep.   Patient states that she drinks a glass of wine every night and  denies any illicit drug use. When specifically without drug use patient states that she only uses the legal kind. Patient states that her shortness of breath is at baseline denies any fevers, nausea, vomiting, abdominal pain, dysuria, frequency, back pain, neck stiffness, focal neurological deficits such as facial asymmetry or limb weakness.  ED Course: Hydralazine, Norvasc, Keppra, Lopressor given in the ED.  Review of Systems: As per HPI otherwise all other systems reviewed and are negative  Ambulatory Status: no restrictions  Past Medical History:  Diagnosis Date  . Asthma   . Atrial fibrillation ([place])   . COPD (chronic obstructive pulmonary disease) ([place])   . Hypertension   . Irregular heart rate   . PRES (posterior reversible encephalopathy syndrome)   . Stroke [facility])     Past Surgical History:  Procedure Laterality Date  . CESAREAN SECTION    . Left Foot Reconstruction    . OPEN REDUCTION INTERNAL FIXATION (ORIF) METACARPAL Right 03/10/2015   Procedure: OPEN REDUCTION INTERNAL FIXATION (ORIF) RIGHT LONG  METACARPAL FRACTURE;  Surgeon: Leanora Cover, MD;  Location: [facility];  Service: Orthopedics;  Laterality: Right;  . TONSILLECTOMY    . TOTAL HIP ARTHROPLASTY Right 02/09/2016   Procedure: TOTAL HIP ARTHROPLASTY ANTERIOR APPROACH;  Surgeon: Frederik Pear, MD;  Location: [facility];  Service: Orthopedics;  Laterality: Right;    Social History   Social History  . Marital status: Divorced    Spouse name: N/A  . Number of children: 1  . Years of education: N/A   Occupational History  .  Not on file.   Social History Main Topics  . Smoking status: Former Smoker    Types: Cigarettes  . Smokeless tobacco: Never Used  . Alcohol use Yes     Comment: wine daily  . Drug use: No  . Sexual activity: Not on file   Other Topics Concern  . Not on file   Social History Narrative   Lives with daughter and her son   Caffeine use: 1 cup coffee every morning    No  Known Allergies  Family History  Problem Relation Age of Onset  . Dementia Father     Prior to Admission medications   Medication Sig Start Date End Date Taking? Authorizing Provider  acetaminophen (TYLENOL) 500 MG tablet Take 500-1,000 mg by mouth every 6 (six) hours as needed (for pain).    [provider]  amLODipine (NORVASC) 10 MG tablet Take 1 tablet (10 mg total) by mouth daily. 11/04/16   Donne Hazel, MD  atorvastatin (LIPITOR) 20 MG tablet Take 20 mg by mouth daily. 04/08/16   [provider]  budesonide-formoterol (SYMBICORT) 160-4.5 MCG/ACT inhaler Inhale 2 puffs into the lungs 2 (two) times daily.    [provider]  citalopram (CELEXA) 10 MG tablet Take 10 mg by mouth daily.    [provider]  famotidine (PEPCID) 20 MG tablet Take 1 tablet (20 mg total) by mouth at bedtime. 11/03/16   Donne Hazel, MD  hydrALAZINE (APRESOLINE) 10 MG tablet Take 1 tablet (10 mg total) by mouth every 8 (eight) hours. 11/03/16   Donne Hazel, MD  ibuprofen (ADVIL,MOTRIN) 200 MG tablet Take 200-400 mg by mouth every 6 (six) hours as needed for headache or mild pain.     [provider]  levETIRAcetam (KEPPRA) 500 MG tablet Take 1 tablet (500 mg total) by mouth 2 (two) times daily. 11/03/16   Donne Hazel, MD  losartan-hydrochlorothiazide (HYZAAR) 100-25 MG tablet Take 1 tablet by mouth daily.  01/09/16   [provider]  metoprolol tartrate (LOPRESSOR) 25 MG tablet Take 1 tablet (25 mg total) by mouth 2 (two) times daily. 11/03/16   Donne Hazel, MD  Multiple Vitamin (MULTIVITAMIN) tablet Take 1 tablet by mouth daily with breakfast.     [provider]    Physical Exam: Vitals:   11/04/16 1100 11/04/16 1115 11/04/16 1130 11/04/16 1145  BP: (!) 200/90 (!) 157/69 (!) 167/70 (!) 113/92  Pulse:      Resp: 13 17 14 15   Temp:      TempSrc:      SpO2:      Weight:      Height:          General: Frail cachectic appearing,  resting in bed. Eyes:  PERRL, EOMI, normal lids, iris ENT: dry mm, normal hearing,  Neck:  no LAD, masses or thyromegaly Cardiovascular:  RRR, no m/r/g. No LE edema.  Respiratory:  CTA bilaterally, no w/r/r. Normal respiratory effort. Abdomen:  soft, ntnd, NABS Skin: The next covering the majority of patient's forearms with several scabby lesions uncovered as well. Musculoskeletal:  grossly normal tone BUE/BLE, good ROM, no bony abnormality Psychiatric:  grossly normal mood and affect, speech fluent and appropriate, AOx3 Neurologic:  CN 2-12 grossly intact, moves all extremities in coordinated fashion, sensation intact  Labs on Admission: I have personally reviewed following labs and imaging studies  CBC:  Recent Labs Lab 10/30/16 0010 10/30/16 0455 10/31/16 0529 11/02/16 0012 11/03/16 0434 11/04/16  1000  WBC 15.3* 15.7* 10.8* 10.4 7.8 7.8  NEUTROABS 12.2* 13.4*  --  8.3* 5.0 4.3  HGB 16.2* 13.9 15.2* 13.6 14.6 13.9  HCT 45.5 38.5 43.3 39.0 41.8 40.0  MCV 90.8 88.1 91.4 89.7 89.9 90.7  PLT 392 282 243 242 285 277   Basic Metabolic Panel:  Recent Labs Lab 10/30/16 0455 10/30/16 1902 10/31/16 0529 11/02/16 0012 11/02/16 0743 11/03/16 0434 11/04/16 1000  NA 130*  --  139 140 141  --  135  K 2.9*  --  3.4* 2.9* 3.3*  --  3.6  CL 99*  --  108 106 108  --  99*  CO2 24  --  21* 27 26  --  25  GLUCOSE 107*  --  94 120* 112*  --  126*  BUN 7  --  6 <5* <5*  --  15  CREATININE 0.70  --  0.72 0.65 0.61  --  0.79  CALCIUM 8.5*  --  8.6* 8.9 8.9  --  9.4  MG  --  2.0 1.8 1.7  --  1.8  --   PHOS  --   --  2.8 2.2*  --  3.6  --    GFR: Estimated Creatinine Clearance: 57.3 mL/min (by C-G formula based on SCr of 0.79 mg/dL). Liver Function Tests:  Recent Labs Lab 10/30/16 0010 10/30/16 0455 10/31/16 0529  AST 54* 32  --   ALT 35 27  --   ALKPHOS 87 58  --   BILITOT 1.2 0.9  --   PROT 8.0 5.7*  --   ALBUMIN 4.7 3.5 3.2*   No results for input(s): LIPASE, AMYLASE in  the last 168 hours. No results for input(s): AMMONIA in the last 168 hours. Coagulation Profile: No results for input(s): INR, PROTIME in the last 168 hours. Cardiac Enzymes:  Recent Labs Lab 10/30/16 1902 10/30/16 2314 10/31/16 0529  TROPONINI 0.03* 0.04* 0.04*   BNP (last 3 results) No results for input(s): PROBNP in the last 8760 hours. HbA1C: No results for input(s): HGBA1C in the last 72 hours. CBG:  Recent Labs Lab 11/02/16 2010 11/02/16 2358 11/03/16 0414 11/03/16 0816 11/03/16 1117  GLUCAP 160* 96 115* 115* 155*   Lipid Profile: No results for input(s): CHOL, HDL, LDLCALC, TRIG, CHOLHDL, LDLDIRECT in the last 72 hours. Thyroid Function Tests: No results for input(s): TSH, T4TOTAL, FREET4, T3FREE, THYROIDAB in the last 72 hours. Anemia Panel: No results for input(s): VITAMINB12, FOLATE, FERRITIN, TIBC, IRON, RETICCTPCT in the last 72 hours. Urine analysis:    Component Value Date/Time   COLORURINE YELLOW 10/30/2016 0023   APPEARANCEUR CLEAR 10/30/2016 0023   LABSPEC 1.014 10/30/2016 0023   PHURINE 6.0 10/30/2016 0023   GLUCOSEU NEGATIVE 10/30/2016 0023   HGBUR NEGATIVE 10/30/2016 0023   BILIRUBINUR NEGATIVE 10/30/2016 0023   KETONESUR 20 (A) 10/30/2016 0023   PROTEINUR 100 (A) 10/30/2016 0023   NITRITE NEGATIVE 10/30/2016 0023   LEUKOCYTESUR NEGATIVE 10/30/2016 0023    Creatinine Clearance: Estimated Creatinine Clearance: 57.3 mL/min (by C-G formula based on SCr of 0.79 mg/dL).  Sepsis Labs: @LABRCNTIP (procalcitonin:4,lacticidven:4) ) Recent Results (from the past 240 hour(s))  MRSA PCR Screening     Status: Abnormal   Collection Time: 10/30/16  7:11 PM  Result Value Ref Range Status   MRSA by PCR POSITIVE (A) NEGATIVE Final    Comment:        The GeneXpert MRSA Assay (FDA approved for NASAL specimens only), is one component  of a comprehensive MRSA colonization surveillance program. It is not intended to diagnose MRSA infection nor to guide  or monitor treatment for MRSA infections. RESULT CALLED TO, READ BACK BY AND VERIFIED WITH: A TROXLER RN [phone] 10/31/16 A BROWNING      Radiological Exams on Admission: No results found.  EKG: Independently reviewed. Sinus. Minimal depression in ant/lat leads.   Assessment/Plan Active Problems:   COPD (chronic obstructive pulmonary disease) (HCC)   Abnormal EKG   Seizures (H[facility])   PAF (paroxysmal atrial fibrillation) ([facility])   PRES (posterior reversible encephalopathy syndrome)   Hypertensive emergency   EtOH dependence ([place])   Hyperglycemia   PRES: pts second admission for the same after leaving AMA. BP very difficult to control. Current episode occurred overnight after leaving AMA w/o medications. Pt was reportedly up for hours in the middle of the night confused and agitated. No focal neuro deficits to suggest CVA. No infectious or significant metabolic derangements noted. Questionable szr reported but cannot confirm. MRI report reviewed independently from last admission noting PRES changes. Will likely need multiple day stay to ensure safe BP readings w/ titration of medications.  - Continue TID hydralazine and BID Metop which were started last admission - continue home Cozaar - Hold home HCTZ due to hyponatremia noted during last admission.  - Keppra for reported Szr. (No driving for 6 mo. Continue Keppra until sees neuro in office per their last note on 11/01/16).  Hypertensive emergency: Confusional state with systolic blood pressures as high as 830 and diastolic pressures as high as 92. Rapidly improving after administration of oral and IV antihypertensives in the ED. - Treatment plan as above  EKG changes: Noted above. Pt asymptomatic and likely related to HTN emergency. Mild elevation of trop during last admission.  - Repeat EKG now that BP improved.  - Cycle trop - Tele  PAF: rate controlled in sinus.  - continue metop  ETOH use: Pt endorses drinking a glass of red wine  nightly - CIWA   COPD: not actively smoking. No exacerbation.  - Continue dulera  HLD: - continue statin  Hyperglycemia: 126. Last A1c 10/30/16 of 5.4.  - no further mgt/monitoring  Depression/skin picking: at baseline - continue Celexa  GERD: - continue pepcid   DVT prophylaxis: Lovenox  Code Status: full  Family Communication: none  Disposition Plan: pending improvement in mental status and BP control  Consults called: none  Admission status: inpt    MERRELL, DAVID J MD [facility]  If 7PM-7AM, please contact night-coverage [email or web address] Password [email or web address]  11/04/2016, 1:27 PM

## 2016-11-04 NOTE — ED Notes (Signed)
Attempted report 

## 2016-11-05 LAB — BASIC METABOLIC PANEL
Anion gap: 10 (ref 5–15)
BUN: 9 mg/dL (ref 6–20)
CO2: 27 mmol/L (ref 22–32)
Calcium: 9.3 mg/dL (ref 8.9–10.3)
Chloride: 100 mmol/L — ABNORMAL LOW (ref 101–111)
Creatinine, Ser: 0.66 mg/dL (ref 0.44–1.00)
GFR calc Af Amer: >60 mL/min (ref >60)
GFR calc non Af Amer: >60 mL/min (ref >60)
Glucose, Bld: 102 mg/dL — ABNORMAL HIGH (ref 65–99)
Potassium: 3.4 mmol/L — ABNORMAL LOW (ref 3.5–5.1)
Sodium: 137 mmol/L (ref 135–145)

## 2016-11-05 LAB — CBC
HCT: 40.2 % (ref 36.0–46.0)
Hemoglobin: 13.8 g/dL (ref 12.0–15.0)
MCH: 31.1 pg (ref 26.0–34.0)
MCHC: 34.3 g/dL (ref 30.0–36.0)
MCV: 90.5 fL (ref 78.0–100.0)
Platelets: 283 10*3/uL (ref 150–400)
RBC: 4.44 MIL/uL (ref 3.87–5.11)
RDW: 12 % (ref 11.5–15.5)
WBC: 7.9 10*3/uL (ref 4.0–10.5)

## 2016-11-05 LAB — TROPONIN I: Troponin I: <0.03 ng/mL (ref <0.03)

## 2016-11-05 MED ORDER — MUPIROCIN 2 % EX OINT
1.0000 "application " | TOPICAL_OINTMENT | Freq: Two times a day (BID) | CUTANEOUS | Status: DC
  Administered 2016-11-05 – 2016-11-06 (×3): 1 via NASAL
  Filled 2016-11-05 (×2): qty 22

## 2016-11-05 MED ORDER — CHLORHEXIDINE GLUCONATE CLOTH 2 % EX PADS
6.0000 | MEDICATED_PAD | Freq: Every day | CUTANEOUS | Status: DC
  Administered 2016-11-05 – 2016-11-06 (×2): 6 via TOPICAL

## 2016-11-05 MED ORDER — AMLODIPINE BESYLATE 5 MG PO TABS
5.0000 mg | ORAL_TABLET | Freq: Every day | ORAL | Status: DC
  Administered 2016-11-05 – 2016-11-06 (×2): 5 mg via ORAL
  Filled 2016-11-05 (×2): qty 1

## 2016-11-05 MED ORDER — WHITE PETROLATUM GEL
Status: AC
  Administered 2016-11-05: 18:00:00
  Filled 2016-11-05: qty 1

## 2016-11-05 MED ORDER — PODOFILOX 0.5 % EX SOLN
0.2500 mL | Freq: Two times a day (BID) | CUTANEOUS | Status: DC
  Filled 2016-11-05 (×6): qty 1

## 2016-11-05 MED ORDER — PODOFILOX 0.5 % EX SOLN
Freq: Two times a day (BID) | CUTANEOUS | Status: DC
  Filled 2016-11-05 (×15): qty 1

## 2016-11-05 MED ORDER — HYDRALAZINE HCL 25 MG PO TABS
25.0000 mg | ORAL_TABLET | Freq: Three times a day (TID) | ORAL | Status: DC
Start: 2016-11-05 — End: 2016-11-06
  Administered 2016-11-05 – 2016-11-06 (×4): 25 mg via ORAL
  Filled 2016-11-05 (×4): qty 1

## 2016-11-05 NOTE — Progress Notes (Signed)
PROGRESS NOTE    Kim Blankenship  NTZ:[id number] DOB: [birth year] DOA: 11/04/2016 PCP: Briscoe Deutscher, MD     Brief Narrative:    71 y.o. female with history of COPD, Poorly controlled hypertension, hyperlipidemia paroxysmal atrial fibrillation was brought to the ER for the second time in week because of HTN crisis with encephalopathy that improves with BP control .  Assessment & Plan:    1-Malignant HTN with posisble PRES : Complicated with seizure one week ago and required carden drip with transition to po Meds then left AMA with unclear social situation if she complies with her PO meds , back with encephalopathy and HTN crisis , improved overnight with BP control , she is completely alert and awake and oriented now with no complaints . I will continue cozaar 100mg /d , Metoprolol 25 mg BID , add Norvasc 5 mg daily, increase Hydralazine to 25 mg TID , send for US renal artery .  2-Hx of Seizure in the setting of malignant HTN : Continue Keppra . 3- Hx of HLD : Continue statin . 4-Hx of P afib : Not good candidate of AC with her noncompliance Hx and HTN crisis  .     Last Vitals:  Vitals:   11/04/16 2213 11/05/16 0428  BP: (!) 146/67 (!) 145/74  Pulse: 75 71  Resp: 18   Temp: 98.2 F (36.8 C) 97.7 F (36.5 C)              DVT prophylaxis: (Lovenox Code Status: (Full Family Communication: (none ) Disposition Plan: (home     Consultants:   None     Procedures: None   Antimicrobials: (specify start and planned stop date. Auto populated tables are space occupying and do not give end dates)  None      Subjective: Denies any complaints, no CP no SOB , no other issues , BP is better controlled.  Objective: Vitals:   11/04/16 1733 11/04/16 2120 11/04/16 2213 11/05/16 0428  BP: (!) 156/96 (!) 147/75 (!) 146/67 (!) 145/74  Pulse: 70 80 75 71  Resp: 18 18 18    Temp:   98.2 F (36.8 C) 97.7 F (36.5 C)  TempSrc:   Oral   SpO2: 100% 96% 95% 92%    Weight:      Height:        Intake/Output Summary (Last 24 hours) at 11/05/16 1144 Last data filed at 11/05/16 1000  Gross per 24 hour  Intake              120 ml  Output              200 ml  Net              -80 ml   Filed Weights   11/04/16 0924  Weight: 61.7 kg (136 lb)    Examination:  General exam: NAD. Respiratory system: CTAB . Cardiovascular system: S1 & S2 heard, RRR. No JVD, murmurs, rubs, gallops or clicks. No pedal edema. Gastrointestinal system: Abdomen is nondistended, soft and nontender. No organomegaly or masses felt. Normal bowel sounds heard. Central nervous system: Alert and oriented. No focal neurological deficits. Extremities: Symmetric 5 x 5 power. Skin: No rashes, lesions or ulcers Psychiatry: Judgement and insight appear normal. Mood & affect appropriate.     Data Reviewed: I have personally reviewed following labs and imaging studies  CBC:  Recent Labs Lab 10/30/16 0010 10/30/16 0455 10/31/16 0529 11/02/16 0012 11/03/16 0434 11/04/16 1000 11/05/16 0356  WBC  15.3* 15.7* 10.8* 10.4 7.8 7.8 7.9  NEUTROABS 12.2* 13.4*  --  8.3* 5.0 4.3  --   HGB 16.2* 13.9 15.2* 13.6 14.6 13.9 13.8  HCT 45.5 38.5 43.3 39.0 41.8 40.0 40.2  MCV 90.8 88.1 91.4 89.7 89.9 90.7 90.5  PLT 392 282 243 242 285 279 696   Basic Metabolic Panel:  Recent Labs Lab 10/30/16 1902 10/31/16 0529 11/02/16 0012 11/02/16 0743 11/03/16 0434 11/04/16 1000 11/05/16 0356  NA  --  139 140 141  --  135 137  K  --  3.4* 2.9* 3.3*  --  3.6 3.4*  CL  --  108 106 108  --  99* 100*  CO2  --  21* 27 26  --  25 27  GLUCOSE  --  94 120* 112*  --  126* 102*  BUN  --  6 <5* <5*  --  15 9  CREATININE  --  0.72 0.65 0.61  --  0.79 0.66  CALCIUM  --  8.6* 8.9 8.9  --  9.4 9.3  MG 2.0 1.8 1.7  --  1.8  --   --   PHOS  --  2.8 2.2*  --  3.6  --   --    GFR: Estimated Creatinine Clearance: 57.3 mL/min (by C-G formula based on SCr of 0.66 mg/dL). Liver Function Tests:  Recent  Labs Lab 10/30/16 0010 10/30/16 0455 10/31/16 0529  AST 54* 32  --   ALT 35 27  --   ALKPHOS 87 58  --   BILITOT 1.2 0.9  --   PROT 8.0 5.7*  --   ALBUMIN 4.7 3.5 3.2*   No results for input(s): LIPASE, AMYLASE in the last 168 hours. No results for input(s): AMMONIA in the last 168 hours. Coagulation Profile: No results for input(s): INR, PROTIME in the last 168 hours. Cardiac Enzymes:  Recent Labs Lab 10/30/16 1902 10/30/16 2314 10/31/16 0529 11/04/16 1746 11/04/16 2334  TROPONINI 0.03* 0.04* 0.04* <0.03 <0.03   BNP (last 3 results) No results for input(s): PROBNP in the last 8760 hours. HbA1C: No results for input(s): HGBA1C in the last 72 hours. CBG:  Recent Labs Lab 11/02/16 2010 11/02/16 2358 11/03/16 0414 11/03/16 0816 11/03/16 1117  GLUCAP 160* 96 115* 115* 155*   Lipid Profile: No results for input(s): CHOL, HDL, LDLCALC, TRIG, CHOLHDL, LDLDIRECT in the last 72 hours. Thyroid Function Tests: No results for input(s): TSH, T4TOTAL, FREET4, T3FREE, THYROIDAB in the last 72 hours. Anemia Panel: No results for input(s): VITAMINB12, FOLATE, FERRITIN, TIBC, IRON, RETICCTPCT in the last 72 hours. Urine analysis:    Component Value Date/Time   COLORURINE YELLOW 11/04/2016 [place] 11/04/2016 1650   LABSPEC 1.012 11/04/2016 1650   PHURINE 6.0 11/04/2016 1650   GLUCOSEU NEGATIVE 11/04/2016 1650   HGBUR NEGATIVE 11/04/2016 1650   BILIRUBINUR NEGATIVE 11/04/2016 1650   KETONESUR NEGATIVE 11/04/2016 1650   PROTEINUR NEGATIVE 11/04/2016 1650   NITRITE NEGATIVE 11/04/2016 1650   LEUKOCYTESUR MODERATE (A) 11/04/2016 1650   Sepsis Labs: @LABRCNTIP (procalcitonin:4,lacticidven:4)  ) Recent Results (from the past 240 hour(s))  MRSA PCR Screening     Status: Abnormal   Collection Time: 10/30/16  7:11 PM  Result Value Ref Range Status   MRSA by PCR POSITIVE (A) NEGATIVE Final    Comment:        The GeneXpert MRSA Assay (FDA approved for  NASAL specimens only), is one component of a comprehensive MRSA colonization surveillance  program. It is not intended to diagnose MRSA infection nor to guide or monitor treatment for MRSA infections. RESULT CALLED TO, READ BACK BY AND VERIFIED WITH: A TROXLER RN [phone] 10/31/16 A BROWNING          Radiology Studies: No results found.      Scheduled Meds: . amLODipine  5 mg Oral Daily  . atorvastatin  20 mg Oral q1800  . Chlorhexidine Gluconate Cloth  6 each Topical Q0600  . citalopram  10 mg Oral Daily  . enoxaparin (LOVENOX) injection  40 mg Subcutaneous Q24H  . famotidine  20 mg Oral QHS  . hydrALAZINE  10 mg Oral Q8H  . levETIRAcetam  500 mg Oral BID  . losartan  100 mg Oral Daily  . metoprolol tartrate  25 mg Oral BID  . mometasone-formoterol  2 puff Inhalation BID  . mupirocin ointment  1 application Nasal BID   Continuous Infusions:   LOS: 1 day    Time spent: [place], MD [facility] Pager [phone]  If 7PM-7AM, please contact night-coverage [email or web address] Password [email or web address] 11/05/2016, 11:44 AM

## 2016-11-06 ENCOUNTER — Inpatient Hospital Stay (HOSPITAL_COMMUNITY): Payer: Medicare Other

## 2016-11-06 DIAGNOSIS — I10 Essential (primary) hypertension: Secondary | ICD-10-CM

## 2016-11-06 DIAGNOSIS — R569 Unspecified convulsions: Secondary | ICD-10-CM

## 2016-11-06 DIAGNOSIS — I161 Hypertensive emergency: Principal | ICD-10-CM

## 2016-11-06 DIAGNOSIS — I480 Paroxysmal atrial fibrillation: Secondary | ICD-10-CM

## 2016-11-06 DIAGNOSIS — I6783 Posterior reversible encephalopathy syndrome: Secondary | ICD-10-CM

## 2016-11-06 LAB — CBC
HCT: 37.3 % (ref 36.0–46.0)
Hemoglobin: 13.5 g/dL (ref 12.0–15.0)
MCH: 32.3 pg (ref 26.0–34.0)
MCHC: 36.2 g/dL — ABNORMAL HIGH (ref 30.0–36.0)
MCV: 89.2 fL (ref 78.0–100.0)
Platelets: 247 10*3/uL (ref 150–400)
RBC: 4.18 MIL/uL (ref 3.87–5.11)
RDW: 12.2 % (ref 11.5–15.5)
WBC: 7.6 10*3/uL (ref 4.0–10.5)

## 2016-11-06 LAB — COMPREHENSIVE METABOLIC PANEL
ALT: 38 U/L (ref 14–54)
AST: 39 U/L (ref 15–41)
Albumin: 3.5 g/dL (ref 3.5–5.0)
Alkaline Phosphatase: 61 U/L (ref 38–126)
Anion gap: 12 (ref 5–15)
BUN: 8 mg/dL (ref 6–20)
CO2: 24 mmol/L (ref 22–32)
Calcium: 9.4 mg/dL (ref 8.9–10.3)
Chloride: 101 mmol/L (ref 101–111)
Creatinine, Ser: 0.65 mg/dL (ref 0.44–1.00)
GFR calc Af Amer: >60 mL/min (ref >60)
GFR calc non Af Amer: >60 mL/min (ref >60)
Glucose, Bld: 100 mg/dL — ABNORMAL HIGH (ref 65–99)
Potassium: 3.6 mmol/L (ref 3.5–5.1)
Sodium: 137 mmol/L (ref 135–145)
Total Bilirubin: 1 mg/dL (ref 0.3–1.2)
Total Protein: 5.9 g/dL — ABNORMAL LOW (ref 6.5–8.1)

## 2016-11-06 MED ORDER — TRAZODONE HCL 50 MG PO TABS
50.0000 mg | ORAL_TABLET | Freq: Once | ORAL | Status: AC
  Administered 2016-11-06: 50 mg via ORAL
  Filled 2016-11-06: qty 1

## 2016-11-06 MED ORDER — VALACYCLOVIR HCL 500 MG PO TABS: 500.0000 mg | ORAL_TABLET | Freq: Two times a day (BID) | ORAL | 0 refills | Status: AC

## 2016-11-06 MED ORDER — HYDRALAZINE HCL 10 MG PO TABS: 10.0000 mg | ORAL_TABLET | Freq: Three times a day (TID) | ORAL | 0 refills | Status: DC

## 2016-11-06 MED ORDER — VALACYCLOVIR HCL 500 MG PO TABS
500.0000 mg | ORAL_TABLET | Freq: Two times a day (BID) | ORAL | Status: DC
  Administered 2016-11-06: 500 mg via ORAL
  Filled 2016-11-06: qty 1

## 2016-11-06 MED ORDER — LEVETIRACETAM 500 MG PO TABS: 500.0000 mg | ORAL_TABLET | Freq: Two times a day (BID) | ORAL | 0 refills | Status: DC

## 2016-11-06 MED ORDER — METOPROLOL TARTRATE 25 MG PO TABS: 25.0000 mg | ORAL_TABLET | Freq: Two times a day (BID) | ORAL | 0 refills | Status: DC

## 2016-11-06 NOTE — Discharge Summary (Addendum)
Discharge Summary  Kim Blankenship ONG:[id number] DOB: [birth year]  PCP: Glenford Bayley, DO  Admit date: 11/04/2016 Discharge date: 8/82018  Time spent: >16mins, more than 50% spent on coordination of care, patient has poor memory, discharge plan and meds reviewed in detail with patient's daughter over the phone, discharge plan also reviewed with RN and care manager.  Recommendations for Outpatient Follow-up:  1. F/u with PMD within a week  for hospital discharge follow up, repeat cbc/bmp at follow up. pmd to monitor blood pressure management and follow up on renal artery doppler study ( result pending at time of discharge) 2. F/u with neurology for memory impairment and seizure management,. Patient is advised not to drive. 3. Home health RN arranged, outpatient PT referral   Discharge Diagnoses:  Active Hospital Problems   Diagnosis Date Noted  . Hypertensive emergency 11/04/2016  . EtOH dependence ([place]) 11/04/2016  . Hyperglycemia 11/04/2016  . PRES (posterior reversible encephalopathy syndrome) 10/30/2016  . PAF (paroxysmal atrial fibrillation) ([place]) 10/30/2016  . Seizures ([place]) 10/30/2016  . Abnormal EKG 08/19/2013  . COPD (chronic obstructive pulmonary disease) ([place]) 08/19/2013    Resolved Hospital Problems   Diagnosis Date Noted Date Resolved  No resolved problems to display.    Discharge Condition: stable  Diet recommendation: heart healthy  Filed Weights   11/04/16 0924  Weight: 61.7 kg (136 lb)    History of present illness:  PCP: Briscoe Deutscher, MD Patient coming from: home  Chief Complaint: Confusion  HPI: Kim Blankenship is a 71 y.o. female with medical history significant of hypertension,PRES, A. fib, COPD, CVA.  Level V caveat applies as patient continues to present with some mild confusion and her history of events leading up to her admission orally those which she was told by her daughter who lives with her. Patient currently struggling with understanding  numbers, for example having difficulty writing phone numbers.   Pt reports needing to leave the hospital the day prior to current admission due to having to get money to give to her daughter. Patient states that the only way she could do this was by going to [facility] to withdraw all the money and that no one else was able to do this. Patient states she was unaware of the fact that she could come back to the hospital to be seen in emergency room for readmission if needed. Patient states that she had some palpitations the day prior to readmission but otherwise went to bed feeling normal. Patient's daughter sleeps in the same room and reports that patient had a confusional episode that started at 02:30. Urgency this was described the seizure but there is no actual seizure type activity described. Patient was described as talking gibberish, and confused. Patient states that she felt like her "brain was gone. "  Patient originally states that symptoms started later in the morning and continued until approximately noon. Patient states that after her initial episode of acting on being confused she sat there staring at the wall for any prolonged period of time. Other family members went back to sleep per report. Patient reports feeling and acting very agitated throughout the night. Patient is unsure if she went back to sleep.   Patient states that she drinks a glass of wine every night and denies any illicit drug use. When specifically without drug use patient states that she only uses the legal kind. Patient states that her shortness of breath is at baseline denies any fevers, nausea, vomiting, abdominal pain, dysuria, frequency, back  pain, neck stiffness, focal neurological deficits such as facial asymmetry or limb weakness.  ED Course: Hydralazine, Norvasc, Keppra, Lopressor given in the ED.   Hospital Course:  Active Problems:   COPD (chronic obstructive pulmonary disease) (HCC)   Abnormal EKG   Seizures  (H[facility])   PAF (paroxysmal atrial fibrillation) (H[facility])   PRES (posterior reversible encephalopathy syndrome)   Hypertensive emergency   EtOH dependence ([place])   Hyperglycemia   Malignant HTN with posisble PRES : - Complicated with seizure one week ago and required carden drip with transition to po Meds then left AMA on 8/5, reports she needs to" take care had to take care some money issues", she returned back to the hospital on 8/6  with encephalopathy and HTN crisis , improved overnight with BP control , she is completely alert and awake and oriented now with no complaints . She is continued on cozaar/HCTz 100mg /25mg /d (she has been on this at home, not sure if she takes this regularly,) she is started on Metoprolol 25 mg BID  And continue on hydralazine 10mg  tid (  listed as her home meds prior to hospitalization),  -I have talked to patient's daughter over the phone, daughter reports they just got a blood pressure cuff and they plan to check her blood pressure at home, pmd to continue adjust bp meds for her. Home health RN arranged for medication supervision ( she report chronic memory issues for at least a few months)  US renal artery duplex result pending at time of discharge, pmd to follow up on final result.  Seizure in the setting of malignant HTN:  She was started on keppra on 8/1 , she had EEG done on 8/1 showed no seizure spikes, mri on 8/1 + PRES, chronic microvascular ischemic disease.Continue Keppra . She is to follow up with neurology  HLD : Continue statin .  P afib : CHADs vasc score  At least 3 including age, female, htn. Not good candidate of AC with her noncompliance Hx and HTN crisis  . Reevaluate risk/benefit of anticoagulation Should patient's compliance issue improves   Procedures:  none  Consultations:  Neurology and critical care consulted during prior hospitalization two days ago, she returned to the hospital after signing out AMA two days ago.  Discharge  Exam: BP (!) 108/52 (BP Location: Left Arm)   Pulse 87   Temp 98.4 F (36.9 C) (Oral)   Resp 18   Ht 5\' 2"  (1.575 m)   Wt 61.7 kg (136 lb)   SpO2 97%   BMI 24.87 kg/m   General: NAD, does has poor memory , she talked to her daughter over the phone states " I have not been seen by a doctor today " , while I entered the room , she dose recognize me though.  Cardiovascular: RRR Respiratory: CTABL Neuro: aaox3,but poor memory. Does not remember she is treated for seizure. No focal deficit, no seizure activity.   Discharge Instructions You were cared for by a hospitalist during your hospital stay. If you have any questions about your discharge medications or the care you received while you were in the hospital after you are discharged, you can call the unit and asked to speak with the hospitalist on call if the hospitalist that took care of you is not available. Once you are discharged, your primary care physician will handle any further medical issues. Please note that NO REFILLS for any discharge medications will be authorized once you are discharged, as it is  imperative that you return to your primary care physician (or establish a relationship with a primary care physician if you do not have one) for your aftercare needs so that they can reassess your need for medications and monitor your lab values.  Discharge Instructions    Ambulatory referral to Neurology    Complete by:  As directed    An appointment is requested in approximately: 4 weeks   Ambulatory referral to Physical Therapy    Complete by:  As directed    Diet - low sodium heart healthy    Complete by:  As directed    Discharge instructions    Complete by:  As directed    Please do not drive for the next 6 months, please follow up with neurology for driving clearance.   Face-to-face encounter (required for Medicare/Medicaid patients)    Complete by:  As directed    I Raizy Auzenne certify that this patient is under my care and  that I, or a nurse practitioner or physician's assistant working with me, had a face-to-face encounter that meets the physician face-to-face encounter requirements with this patient on 11/06/2016. The encounter with the patient was in whole, or in part for the following medical condition(s) which is the primary reason for home health care (List medical condition): poor memory, need medication supervision   The encounter with the patient was in whole, or in part, for the following medical condition, which is the primary reason for home health care:  FTT   I certify that, based on my findings, the following services are medically necessary home health services:  Nursing   Reason for Medically Necessary Home Health Services:  Skilled Nursing- Change/Decline in Patient Status   My clinical findings support the need for the above services:  Cognitive impairments, dementia, or mental confusion  that make it unsafe to leave home   Further, I certify that my clinical findings support that this patient is homebound due to:  Mental confusion   Home Health    Complete by:  As directed    To provide the following care/treatments:  RN   Increase activity slowly    Complete by:  As directed      Allergies as of 11/06/2016      Reactions   Tape Other (See Comments)   THE PATIENT'S SKIN IS THIN AND TEARS VERY EASILY (she "picks" at it; please use coban wrap)      Medication List    STOP taking these medications   spironolactone 25 MG tablet Commonly known as:  ALDACTONE     TAKE these medications   acetaminophen 500 MG tablet Commonly known as:  TYLENOL Take 500-1,000 mg by mouth every 6 (six) hours as needed (for pain or headaches).   aspirin 81 MG chewable tablet Chew 81 mg by mouth daily.   atorvastatin 20 MG tablet Commonly known as:  LIPITOR Take 20 mg by mouth daily.   citalopram 10 MG tablet Commonly known as:  CELEXA Take 10 mg by mouth daily.   hydrALAZINE 10 MG tablet Commonly known  as:  APRESOLINE Take 1 tablet (10 mg total) by mouth every 8 (eight) hours.   ibuprofen 200 MG tablet Commonly known as:  ADVIL,MOTRIN Take 200-600 mg by mouth every 6 (six) hours as needed (for pain or headaches).   levETIRAcetam 500 MG tablet Commonly known as:  KEPPRA Take 1 tablet (500 mg total) by mouth 2 (two) times daily.   losartan-hydrochlorothiazide 100-25 MG tablet Commonly  known as:  HYZAAR Take 1 tablet by mouth daily.   metoprolol tartrate 25 MG tablet Commonly known as:  LOPRESSOR Take 1 tablet (25 mg total) by mouth 2 (two) times daily.   multivitamin tablet Take 1 tablet by mouth daily with breakfast.   traMADol 50 MG tablet Commonly known as:  ULTRAM Take 50-100 mg by mouth 2 (two) times daily as needed for moderate pain.   valACYclovir 500 MG tablet Commonly known as:  VALTREX Take 1 tablet (500 mg total) by mouth 2 (two) times daily.      Allergies  Allergen Reactions  . Tape Other (See Comments)    THE PATIENT'S SKIN IS THIN AND TEARS VERY EASILY (she "picks" at it; please use coban wrap)   Follow-up Information    Briscoe Deutscher, MD Follow up in 1 week(s).   Specialty:  Family Medicine Why:  hospital discharge follow up, please check your blood pressure at home, bring your recording for your doctor to review. pmd to follow up on final renal artery doppler result.  pmd to refer you to have sleep study for insomina and restless legs Contact information: [address] [place] [address] [phone]        [facility] Follow up in 1 month(s).   Why:  memory loss, h/o cva, ? seizure Contact information: [address]     [address] [place] [id number] [place], [place] Follow up.   Why:  Home health services arranged,office will call and set up home visits Contact information: [address] [place] [address] [phone]            The  results of significant diagnostics from this hospitalization (including imaging, microbiology, ancillary and laboratory) are listed below for reference.    Significant Diagnostic Studies: Ct Head Wo Contrast  Result Date: 10/30/2016 CLINICAL DATA:  Initial evaluation for acute altered mental status, seizure. EXAM: CT HEAD WITHOUT CONTRAST TECHNIQUE: Contiguous axial images were obtained from the base of the skull through the vertex without intravenous contrast. COMPARISON:  Prior CT from 04/05/2016. FINDINGS: Brain: Cerebral volume within normal limits, and stable from previous. There is subtle patchy hypodensity within the parasagittal left parieto-occipital region (series 3, image 23), not definitely seen on previous. Additional possible vague hypodensity within the adjacent parasagittal right parieto-occipital lobe as well. Findings are nonspecific, but could reflect sequelae of PRES given history of seizure. Underlying mass not excluded. Few additional scattered subcortical hypodensities involving the bilateral frontal lobes are grossly similar to previous, and may be related underlying chronic microvascular ischemic changes. No acute intracranial hemorrhage. No other evidence for acute large vessel territory infarct. No other mass lesion or midline shift. No hydrocephalus. No extra-axial fluid collection. Vascular: No hyperdense vessel. Scattered vascular calcifications noted within the carotid siphons. Skull: Scalp soft tissues and calvarium within normal limits. Sinuses/Orbits: Globes and orbital soft tissues within normal limits. Mild scattered mucosal thickening within the left sphenoid and maxillary sinuses as well the ethmoidal air cells. No mastoid effusion. IMPRESSION: Patchy hypodensity within the parasagittal left parietal lobe, new from prior. Given the history of seizure, finding may reflect sequelae of PRES. Underlying mass not entirely excluded. Further evaluation with brain MRI, with and  without gadolinium, recommended. Findings discussed by telephone at the time of interpretation on 10/30/2016 at approximately 1:45 a.m. with Dr. Kathrynn Speed. Electronically Signed   By: Pincus Badder.D.  On: 10/30/2016 02:04   Mr Brain W And Wo Contrast  Result Date: 10/30/2016 CLINICAL DATA:  Initial evaluation for acute seizure. EXAM: MRI HEAD WITHOUT AND WITH CONTRAST TECHNIQUE: Multiplanar, multiecho pulse sequences of the brain and surrounding structures were obtained without and with intravenous contrast. CONTRAST:  65mL MULTIHANCE GADOBENATE DIMEGLUMINE 529 MG/ML IV SOLN COMPARISON:  Prior head CT from earlier the same day. FINDINGS: Brain: Cerebral volume within normal limits for age. Patchy T2/FLAIR hyperintensity involving the periventricular and deep white matter both cerebral hemispheres most consistent with chronic small vessel ischemic disease, overall stable from previous. Multifocal patchy T2/FLAIR signal abnormality seen involving the cortical gray matter and underlying subcortical deep white matter of the bilateral parieto-occipital regions in a distribution most consistent with PRES. Associated mild patchy diffusion abnormality. No associated hemorrhage or abnormal enhancement. No other evidence for acute infarct. No mass lesion, midline shift or mass effect. No hydrocephalus. No extra-axial fluid collection. Major dural sinuses are patent. No other abnormal enhancement. Pituitary gland suprasellar region within normal limits. Vascular: Major intracranial vascular flow voids are maintained. Skull and upper cervical spine: Cerebellar tonsils mildly low lying within the foramen magnum without frank Chiari malformation. Mild degenerate spondylolysis noted within upper cervical spine without significant stenosis. Bone marrow signal intensity within normal limits. No scalp soft tissue abnormality. Sinuses/Orbits: Globes and orbital soft tissues within normal limits. Mild scattered  mucosal thickening within the paranasal sinuses. Air-fluid level noted within the left sphenoid sinus. No significant mastoid effusion. Inner ear structures within normal limits. IMPRESSION: 1. Findings consistent with PRES as above. 2. Otherwise stable underlying chronic microvascular ischemic disease. 3. Acute left sphenoid sinusitis. Electronically Signed   By: Jeannine Boga M.D.   On: 10/30/2016 06:08   Dg Chest Portable 1 View  Result Date: 10/30/2016 CLINICAL DATA:  Altered mental status EXAM: PORTABLE CHEST 1 VIEW COMPARISON:  04/05/2016 FINDINGS: Shallow inspiration. Heart size and pulmonary vascularity are normal. Probable emphysematous changes in the lungs. No airspace disease or consolidation. No blunting of costophrenic angles. No pneumothorax. Calcified and tortuous aorta. IMPRESSION: Shallow inspiration. Probable emphysematous changes in the lungs. Aortic atherosclerosis. No active pulmonary disease. Electronically Signed   By: Lucienne Capers M.D.   On: 10/30/2016 00:45    Microbiology: Recent Results (from the past 240 hour(s))  MRSA PCR Screening     Status: Abnormal   Collection Time: 10/30/16  7:11 PM  Result Value Ref Range Status   MRSA by PCR POSITIVE (A) NEGATIVE Final    Comment:        The GeneXpert MRSA Assay (FDA approved for NASAL specimens only), is one component of a comprehensive MRSA colonization surveillance program. It is not intended to diagnose MRSA infection nor to guide or monitor treatment for MRSA infections. RESULT CALLED TO, READ BACK BY AND VERIFIED WITH: A TROXLER RN [id number] 10/31/16 A BROWNING      Labs: Basic Metabolic Panel:  Recent Labs Lab 11/02/16 0012 11/02/16 0743 11/03/16 0434 11/04/16 1000 11/05/16 0356 11/06/16 0619  NA 140 141  --  135 137 137  K 2.9* 3.3*  --  3.6 3.4* 3.6  CL 106 108  --  99* 100* 101  CO2 27 26  --  25 27 24   GLUCOSE 120* 112*  --  126* 102* 100*  BUN <5* <5*  --  15 9 8   CREATININE 0.65 0.61   --  0.79 0.66 0.65  CALCIUM 8.9 8.9  --  9.4 9.3 9.4  MG  1.7  --  1.8  --   --   --   PHOS 2.2*  --  3.6  --   --   --    Liver Function Tests:  Recent Labs Lab 11/06/16 0619  AST 39  ALT 38  ALKPHOS 61  BILITOT 1.0  PROT 5.9*  ALBUMIN 3.5   No results for input(s): LIPASE, AMYLASE in the last 168 hours. No results for input(s): AMMONIA in the last 168 hours. CBC:  Recent Labs Lab 11/02/16 0012 11/03/16 0434 11/04/16 1000 11/05/16 0356 11/06/16 0619  WBC 10.4 7.8 7.8 7.9 7.6  NEUTROABS 8.3* 5.0 4.3  --   --   HGB 13.6 14.6 13.9 13.8 13.5  HCT 39.0 41.8 40.0 40.2 37.3  MCV 89.7 89.9 90.7 90.5 89.2  PLT 242 285 279 283 247   Cardiac Enzymes:  Recent Labs Lab 11/04/16 1746 11/04/16 2334  TROPONINI <0.03 <0.03   BNP: BNP (last 3 results) No results for input(s): BNP in the last 8760 hours.  ProBNP (last 3 results) No results for input(s): PROBNP in the last 8760 hours.  CBG:  Recent Labs Lab 11/02/16 2010 11/02/16 2358 11/03/16 0414 11/03/16 0816 11/03/16 1117  GLUCAP 160* 96 115* 115* 155*       Signed:  Merina Behrendt MD, PhD  [facility] 11/08/2016, 11:02 PM

## 2016-11-06 NOTE — Evaluation (Signed)
Physical Therapy Evaluation Patient Details Name: Kim Blankenship MRN: [id number] DOB: 1945/06/11 Today's Date: 11/06/2016   History of Present Illness  Pt is a 71 y/o female admitted secondary to seizure like activity, hypertensive crisis, and posterior reversible encephalopathy syndrome. Pt recently left AMA on 8/5 secondary to reports of needing to pay bills. PMH includes COPD, a fib, HTN, seizure, CVA, s/p ORIF of R metacarpal, and R THA.   Clinical Impression  Pt admitted secondary to problem above with deficits below. PTA, pt was independent with ambulation. Upon eval, pt with decreased strength, balance, and cognition. Pt requiring min guard to min A for functional mobility this session. Pt also complaining of low back pain. Educated about use of DME at home to increase steadiness, however, pt refusing. Educated about outpatient PT recommendations for back pain and decreased balance, and pt agreeable. Will continue to follow acutely to maximize functional mobility independence and safety.     Follow Up Recommendations Outpatient PT (for low back pain )    Equipment Recommendations  None recommended by PT    Recommendations for Other Services       Precautions / Restrictions Precautions Precautions: Fall Restrictions Weight Bearing Restrictions: No      Mobility  Bed Mobility               General bed mobility comments: Sitting EOB with RN upon entry   Transfers Overall transfer level: Needs assistance Equipment used: None Transfers: Sit to/from Stand Sit to Stand: Min guard         General transfer comment: Min guard for safety. Pt impulsive at times and required cues to wait for PT.   Ambulation/Gait Ambulation/Gait assistance: Min guard;Min assist Ambulation Distance (Feet): 200 Feet Assistive device: 1 person hand held assist Gait Pattern/deviations: Step-through pattern;Staggering left;Staggering right Gait velocity: decreased Gait velocity  interpretation: Below normal speed for age/gender General Gait Details: Min guard to min A required during gait without AD. Initially attempting gait without HHA, however, after 50' needing HHA to increase steadiness. LOB noted with horizontal head turns.   Stairs            Wheelchair Mobility    Modified Rankin (Stroke Patients Only)       Balance Overall balance assessment: Needs assistance Sitting-balance support: No upper extremity supported Sitting balance-Leahy Scale: Good     Standing balance support: No upper extremity supported Standing balance-Leahy Scale: Fair Standing balance comment: can maintain static stance but loses balance with dynamic activity                             Pertinent Vitals/Pain Pain Assessment: 0-10 Pain Score: 9  Pain Location: back  Pain Descriptors / Indicators: Aching;Cramping Pain Intervention(s): Monitored during session;Limited activity within patient's tolerance;Repositioned    Home Living Family/patient expects to be discharged to:: Private residence Living Arrangements: Children Available Help at Discharge: Family;Available 24 hours/day Type of Home: House Home Access: Stairs to enter Entrance Stairs-Rails: Left Entrance Stairs-Number of Steps: 1 Home Layout: Two level;Able to live on main level with bedroom/bathroom Home Equipment: Gilford Rile - 2 wheels;Cane - single point      Prior Function Level of Independence: Independent         Comments: worked full time until 2 yrs ago. Daughter who lives with her has seizures and does not drive. Pt reports that she was driving PTA, but does not drive now.      Hand  Dominance   Dominant Hand: Right    Extremity/Trunk Assessment   Upper Extremity Assessment Upper Extremity Assessment: Overall WFL for tasks assessed    Lower Extremity Assessment Lower Extremity Assessment: Generalized weakness    Cervical / Trunk Assessment Cervical / Trunk Assessment:  Normal  Communication   Communication: No difficulties  Cognition Arousal/Alertness: Awake/alert Behavior During Therapy: WFL for tasks assessed/performed;Impulsive Overall Cognitive Status: Impaired/Different from baseline Area of Impairment: Memory;Safety/judgement;Problem solving;Awareness                     Memory: Decreased short-term memory   Safety/Judgement: Decreased awareness of safety;Decreased awareness of deficits Awareness: Emergent Problem Solving: Difficulty sequencing;Requires verbal cues General Comments: Reports memory deficits at baseline. Reports she hasn't slept well for the past 3 nights because she has been here, however, check out [place] on 8/5. [place] finding difficulties noted.       General Comments General comments (skin integrity, edema, etc.): Educated about recommendations for outpatient PT for back pain and pt agreeable. Educated about use of AD at home to increase stability, however, pt refusing.     Exercises     Assessment/Plan    PT Assessment Patient needs continued PT services  PT Problem List Decreased strength;Decreased activity tolerance;Decreased balance;Decreased mobility;Decreased coordination;Decreased cognition;Decreased knowledge of use of DME;Decreased knowledge of precautions;Decreased safety awareness;Pain       PT Treatment Interventions DME instruction;Gait training;Stair training;Therapeutic activities;Functional mobility training;Therapeutic exercise;Balance training;Cognitive remediation;Patient/family education    PT Goals (Current goals can be found in the Care Plan section)  Acute Rehab PT Goals Patient Stated Goal: return home PT Goal Formulation: With patient Time For Goal Achievement: 11/20/16 Potential to Achieve Goals: Good    Frequency Min 3X/week   Barriers to discharge        Co-evaluation               AM-PAC PT "6 Clicks" Daily Activity  Outcome Measure Difficulty turning over in bed  (including adjusting bedclothes, sheets and blankets)?: A Little Difficulty moving from lying on back to sitting on the side of the bed? : A Little Difficulty sitting down on and standing up from a chair with arms (e.g., wheelchair, bedside commode, etc,.)?: Total Help needed moving to and from a bed to chair (including a wheelchair)?: A Little Help needed walking in hospital room?: A Little Help needed climbing 3-5 steps with a railing? : A Little 6 Click Score: 16    End of Session Equipment Utilized During Treatment: Gait belt Activity Tolerance: Patient tolerated treatment well Patient left: in chair;with call bell/phone within reach;with chair alarm set Nurse Communication: Mobility status PT Visit Diagnosis: Unsteadiness on feet (R26.81);Muscle weakness (generalized) (M62.81);Pain;Difficulty in walking, not elsewhere classified (R26.2) Pain - part of body:  (back )    Time: 4650-3546 PT Time Calculation (min) (ACUTE ONLY): 25 min   Charges:   PT Evaluation $PT Eval Moderate Complexity: 1 Mod PT Treatments $Gait Training: 8-22 mins   PT G Codes:        Leighton Ruff, PT, DPT  Acute Rehabilitation Services  Pager: [phone]   Rudean Hitt 11/06/2016, 1:16 PM

## 2016-11-06 NOTE — Progress Notes (Signed)
Lanney Gins to be D/C'd Home per MD order.  Discussed with the patient and all questions fully answered.  VSS, Skin clean, dry and intact without evidence of skin break down, no evidence of skin tears noted. IV catheter discontinued intact. Site without signs and symptoms of complications. Dressing and pressure applied.  An After Visit Summary was printed and given to the patient. Patient received prescription.  D/c education completed with patient/family including follow up instructions, medication list, d/c activities limitations if indicated, with other d/c instructions as indicated by MD - patient able to verbalize understanding, all questions fully answered.   Patient instructed to return to ED, call 911, or call MD for any changes in condition.   Patient escorted via [place], and D/C home via private auto.  Luci Bank 11/06/2016 8:09 PM

## 2016-11-06 NOTE — Care Management Note (Addendum)
Case Management Note  Patient Details  Name: Kim Blankenship MRN: [id number] Date of Birth: 1945-07-09  Subjective/Objective:                   PCP: Briscoe Deutscher  Action/Plan: Plan is to d/c to home today. CM received consult: Other (see comments)     Home health needs       Comments   Home health RN for medication supervision ( memory issues) Discharge home on 8/8   CM spoke with pt regarding home health services. Choice offered. Pt selected [facility] for home health services. PT evaluation pending .Marland Kitchen..CM to f/u with disposition needs.   Expected Discharge Date:  11/06/16               Expected Discharge Plan:  [place]  In-House Referral:     Discharge planning Services  CM Consult  Post Acute Care Choice:    Choice offered to:  Patient  DME Arranged:    DME Agency:     HH Arranged:  RN [place] Agency:  [place], referral made with [facility] pending MD order. MD aware order needed.  Status of Service:  Completed, signed off  If discussed at [place] of Stay Meetings, dates discussed:    Additional Comments:  Sharin Mons, RN 11/06/2016, 12:51 PM

## 2016-11-07 NOTE — Progress Notes (Signed)
*  PRELIMINARY RESULTS* Vascular Ultrasound Renal Artery Duplex has been completed.  Preliminary findings: Difficult and limited evaluation of bilateral renal arteries due to rapid respiration and poor patient cooperation.  Right renal artery resistive indexes area abnormal.  Limited visualization of the right renal artery, portions seen demonstrate elevated systolic velocities.  Evidence suggests a 1-59% renal artery stenosis on the right. Left renal artery resistive indexes are within normal limits, no obvious renal artery stenosis noted on the left.  Everrett Coombe Exam completed 11/06/2016

## 2016-11-19 ENCOUNTER — Telehealth: Payer: Self-pay | Admitting: *Deleted

## 2016-11-19 NOTE — Telephone Encounter (Signed)
Contacted [place] [place] [facility] office and gave the date/time of the appt for August 31st at 12pm.

## 2016-11-20 ENCOUNTER — Emergency Department (HOSPITAL_COMMUNITY): Payer: Medicare Other

## 2016-11-20 ENCOUNTER — Encounter (HOSPITAL_COMMUNITY): Payer: Self-pay | Admitting: Obstetrics and Gynecology

## 2016-11-20 ENCOUNTER — Inpatient Hospital Stay (HOSPITAL_COMMUNITY)
Admission: EM | Admit: 2016-11-20 | Discharge: 2016-11-22 | DRG: 682 | Disposition: A | Discharge status: Home / Self Care | Payer: Medicare Other | Attending: Family Medicine | Admitting: Family Medicine

## 2016-11-20 DIAGNOSIS — R569 Unspecified convulsions: Secondary | ICD-10-CM

## 2016-11-20 DIAGNOSIS — Z7982 Long term (current) use of aspirin: Secondary | ICD-10-CM | POA: Diagnosis not present

## 2016-11-20 DIAGNOSIS — Z96641 Presence of right artificial hip joint: Secondary | ICD-10-CM | POA: Diagnosis present

## 2016-11-20 DIAGNOSIS — E785 Hyperlipidemia, unspecified: Secondary | ICD-10-CM | POA: Diagnosis present

## 2016-11-20 DIAGNOSIS — S41111A Laceration without foreign body of right upper arm, initial encounter: Secondary | ICD-10-CM | POA: Diagnosis present

## 2016-11-20 DIAGNOSIS — Z8673 Personal history of transient ischemic attack (TIA), and cerebral infarction without residual deficits: Secondary | ICD-10-CM

## 2016-11-20 DIAGNOSIS — K219 Gastro-esophageal reflux disease without esophagitis: Secondary | ICD-10-CM | POA: Diagnosis present

## 2016-11-20 DIAGNOSIS — I509 Heart failure, unspecified: Secondary | ICD-10-CM | POA: Diagnosis present

## 2016-11-20 DIAGNOSIS — E860 Dehydration: Secondary | ICD-10-CM | POA: Diagnosis present

## 2016-11-20 DIAGNOSIS — N179 Acute kidney failure, unspecified: Principal | ICD-10-CM | POA: Diagnosis present

## 2016-11-20 DIAGNOSIS — F32A Depression, unspecified: Secondary | ICD-10-CM | POA: Diagnosis present

## 2016-11-20 DIAGNOSIS — I110 Hypertensive heart disease with heart failure: Secondary | ICD-10-CM | POA: Diagnosis present

## 2016-11-20 DIAGNOSIS — Z87891 Personal history of nicotine dependence: Secondary | ICD-10-CM

## 2016-11-20 DIAGNOSIS — B9620 Unspecified Escherichia coli [E. coli] as the cause of diseases classified elsewhere: Secondary | ICD-10-CM | POA: Diagnosis present

## 2016-11-20 DIAGNOSIS — E079 Disorder of thyroid, unspecified: Secondary | ICD-10-CM | POA: Diagnosis present

## 2016-11-20 DIAGNOSIS — J449 Chronic obstructive pulmonary disease, unspecified: Secondary | ICD-10-CM | POA: Diagnosis present

## 2016-11-20 DIAGNOSIS — F1020 Alcohol dependence, uncomplicated: Secondary | ICD-10-CM | POA: Diagnosis present

## 2016-11-20 DIAGNOSIS — F329 Major depressive disorder, single episode, unspecified: Secondary | ICD-10-CM | POA: Diagnosis present

## 2016-11-20 DIAGNOSIS — S0083XA Contusion of other part of head, initial encounter: Secondary | ICD-10-CM | POA: Diagnosis present

## 2016-11-20 DIAGNOSIS — G40909 Epilepsy, unspecified, not intractable, without status epilepticus: Secondary | ICD-10-CM | POA: Diagnosis present

## 2016-11-20 DIAGNOSIS — X58XXXA Exposure to other specified factors, initial encounter: Secondary | ICD-10-CM | POA: Diagnosis present

## 2016-11-20 DIAGNOSIS — N903 Dysplasia of vulva, unspecified: Secondary | ICD-10-CM | POA: Diagnosis present

## 2016-11-20 DIAGNOSIS — I480 Paroxysmal atrial fibrillation: Secondary | ICD-10-CM | POA: Diagnosis present

## 2016-11-20 DIAGNOSIS — Z609 Problem related to social environment, unspecified: Secondary | ICD-10-CM | POA: Diagnosis present

## 2016-11-20 DIAGNOSIS — E871 Hypo-osmolality and hyponatremia: Secondary | ICD-10-CM | POA: Diagnosis present

## 2016-11-20 DIAGNOSIS — I10 Essential (primary) hypertension: Secondary | ICD-10-CM | POA: Diagnosis present

## 2016-11-20 DIAGNOSIS — Z79899 Other long term (current) drug therapy: Secondary | ICD-10-CM

## 2016-11-20 DIAGNOSIS — I639 Cerebral infarction, unspecified: Secondary | ICD-10-CM

## 2016-11-20 DIAGNOSIS — S41112A Laceration without foreign body of left upper arm, initial encounter: Secondary | ICD-10-CM | POA: Diagnosis present

## 2016-11-20 DIAGNOSIS — N390 Urinary tract infection, site not specified: Secondary | ICD-10-CM | POA: Diagnosis present

## 2016-11-20 DIAGNOSIS — I6783 Posterior reversible encephalopathy syndrome: Secondary | ICD-10-CM | POA: Diagnosis present

## 2016-11-20 DIAGNOSIS — Z91048 Other nonmedicinal substance allergy status: Secondary | ICD-10-CM

## 2016-11-20 LAB — URINALYSIS, ROUTINE W REFLEX MICROSCOPIC
Bilirubin Urine: NEGATIVE
Glucose, UA: NEGATIVE mg/dL
Ketones, ur: NEGATIVE mg/dL
Nitrite: POSITIVE — AB
Protein, ur: NEGATIVE mg/dL
Specific Gravity, Urine: 1.006 (ref 1.005–1.030)
pH: 5 (ref 5.0–8.0)

## 2016-11-20 LAB — I-STAT CHEM 8, ED
BUN: 23 mg/dL — ABNORMAL HIGH (ref 6–20)
Calcium, Ion: 1.11 mmol/L — ABNORMAL LOW (ref 1.15–1.40)
Chloride: 85 mmol/L — ABNORMAL LOW (ref 101–111)
Creatinine, Ser: 1.5 mg/dL — ABNORMAL HIGH (ref 0.44–1.00)
Glucose, Bld: 92 mg/dL (ref 65–99)
HCT: 30 % — ABNORMAL LOW (ref 36.0–46.0)
Hemoglobin: 10.2 g/dL — ABNORMAL LOW (ref 12.0–15.0)
Potassium: 4 mmol/L (ref 3.5–5.1)
Sodium: 118 mmol/L — CL (ref 135–145)
TCO2: 20 mmol/L (ref 0–100)

## 2016-11-20 LAB — CBC
HCT: 29.3 % — ABNORMAL LOW (ref 36.0–46.0)
Hemoglobin: 10.9 g/dL — ABNORMAL LOW (ref 12.0–15.0)
MCH: 31 pg (ref 26.0–34.0)
MCHC: 37.2 g/dL — ABNORMAL HIGH (ref 30.0–36.0)
MCV: 83.2 fL (ref 78.0–100.0)
Platelets: 262 10*3/uL (ref 150–400)
RBC: 3.52 MIL/uL — ABNORMAL LOW (ref 3.87–5.11)
RDW: 11.8 % (ref 11.5–15.5)
WBC: 9 10*3/uL (ref 4.0–10.5)

## 2016-11-20 LAB — BASIC METABOLIC PANEL
Anion gap: 10 (ref 5–15)
BUN: 23 mg/dL — ABNORMAL HIGH (ref 6–20)
CO2: 21 mmol/L — ABNORMAL LOW (ref 22–32)
Calcium: 8.3 mg/dL — ABNORMAL LOW (ref 8.9–10.3)
Chloride: 88 mmol/L — ABNORMAL LOW (ref 101–111)
Creatinine, Ser: 1.38 mg/dL — ABNORMAL HIGH (ref 0.44–1.00)
GFR calc Af Amer: 44 mL/min — ABNORMAL LOW (ref >60)
GFR calc non Af Amer: 38 mL/min — ABNORMAL LOW (ref >60)
Glucose, Bld: 92 mg/dL (ref 65–99)
Potassium: 4.1 mmol/L (ref 3.5–5.1)
Sodium: 119 mmol/L — CL (ref 135–145)

## 2016-11-20 MED ORDER — SODIUM CHLORIDE 0.9 % IV SOLN
INTRAVENOUS | Status: DC
  Administered 2016-11-21: 01:00:00 via INTRAVENOUS

## 2016-11-20 MED ORDER — VITAMIN B-1 100 MG PO TABS
100.0000 mg | ORAL_TABLET | Freq: Every day | ORAL | Status: DC
  Administered 2016-11-21 – 2016-11-22 (×2): 100 mg via ORAL
  Filled 2016-11-20 (×2): qty 1

## 2016-11-20 MED ORDER — LORAZEPAM 2 MG/ML IJ SOLN
0.0000 mg | Freq: Two times a day (BID) | INTRAMUSCULAR | Status: DC
Start: 2016-11-23 — End: 2016-11-22

## 2016-11-20 MED ORDER — LORAZEPAM 2 MG/ML IJ SOLN
0.0000 mg | Freq: Four times a day (QID) | INTRAMUSCULAR | Status: DC
Start: 2016-11-20 — End: 2016-11-22
  Administered 2016-11-21 – 2016-11-22 (×2): 0 mg via INTRAVENOUS

## 2016-11-20 MED ORDER — LORAZEPAM 1 MG PO TABS: 1.0000 mg | ORAL_TABLET | Freq: Four times a day (QID) | ORAL | Status: DC | PRN

## 2016-11-20 MED ORDER — LORAZEPAM 2 MG/ML IJ SOLN: 1.0000 mg | Freq: Four times a day (QID) | INTRAMUSCULAR | Status: DC | PRN

## 2016-11-20 MED ORDER — DEXTROSE 5 % IV SOLN
1.0000 g | Freq: Once | INTRAVENOUS | Status: AC
  Administered 2016-11-20: 1 g via INTRAVENOUS
  Filled 2016-11-20: qty 10

## 2016-11-20 MED ORDER — FOLIC ACID 1 MG PO TABS
1.0000 mg | ORAL_TABLET | Freq: Every day | ORAL | Status: DC
  Administered 2016-11-21 – 2016-11-22 (×2): 1 mg via ORAL
  Filled 2016-11-20 (×2): qty 1

## 2016-11-20 MED ORDER — THIAMINE HCL 100 MG/ML IJ SOLN: 100.0000 mg | Freq: Every day | INTRAMUSCULAR | Status: DC

## 2016-11-20 MED ORDER — ADULT MULTIVITAMIN W/MINERALS CH: 1.0000 | ORAL_TABLET | Freq: Every day | ORAL | Status: DC

## 2016-11-20 MED ORDER — DEXTROSE 5 % IV SOLN
1.0000 g | INTRAVENOUS | Status: DC
  Administered 2016-11-21: 1 g via INTRAVENOUS
  Filled 2016-11-20 (×3): qty 10

## 2016-11-20 MED ORDER — SODIUM CHLORIDE 0.9 % IV BOLUS (SEPSIS)
1000.0000 mL | Freq: Once | INTRAVENOUS | Status: AC
  Administered 2016-11-20: 1000 mL via INTRAVENOUS
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
## 2016-11-20 NOTE — ED Notes (Signed)
ED Provider at bedside. 

## 2016-11-20 NOTE — ED Notes (Signed)
Gave report to ICU nurse

## 2016-11-20 NOTE — ED Notes (Signed)
Pt has a sodium of 118 EDP. Nanavati made aware.

## 2016-11-20 NOTE — ED Notes (Signed)
Family at bedside. Pt's niece and brother are in the room

## 2016-11-20 NOTE — ED Notes (Signed)
Patient made aware that urine sample is needed.  

## 2016-11-21 DIAGNOSIS — N179 Acute kidney failure, unspecified: Secondary | ICD-10-CM | POA: Diagnosis present

## 2016-11-21 DIAGNOSIS — I639 Cerebral infarction, unspecified: Secondary | ICD-10-CM

## 2016-11-21 LAB — CBC
HCT: 29.3 % — ABNORMAL LOW (ref 36.0–46.0)
Hemoglobin: 11 g/dL — ABNORMAL LOW (ref 12.0–15.0)
MCH: 31.6 pg (ref 26.0–34.0)
MCHC: 37.5 g/dL — ABNORMAL HIGH (ref 30.0–36.0)
MCV: 84.2 fL (ref 78.0–100.0)
Platelets: 265 10*3/uL (ref 150–400)
RBC: 3.48 MIL/uL — ABNORMAL LOW (ref 3.87–5.11)
RDW: 12 % (ref 11.5–15.5)
WBC: 8.7 10*3/uL (ref 4.0–10.5)

## 2016-11-21 LAB — BASIC METABOLIC PANEL
Anion gap: 9 (ref 5–15)
Anion gap: 6 (ref 5–15)
Anion gap: 5 (ref 5–15)
BUN: 20 mg/dL (ref 6–20)
BUN: 20 mg/dL (ref 6–20)
BUN: 14 mg/dL (ref 6–20)
CO2: 21 mmol/L — ABNORMAL LOW (ref 22–32)
CO2: 22 mmol/L (ref 22–32)
CO2: 24 mmol/L (ref 22–32)
Calcium: 8.3 mg/dL — ABNORMAL LOW (ref 8.9–10.3)
Calcium: 8.1 mg/dL — ABNORMAL LOW (ref 8.9–10.3)
Calcium: 8.7 mg/dL — ABNORMAL LOW (ref 8.9–10.3)
Chloride: 90 mmol/L — ABNORMAL LOW (ref 101–111)
Chloride: 96 mmol/L — ABNORMAL LOW (ref 101–111)
Chloride: 98 mmol/L — ABNORMAL LOW (ref 101–111)
Creatinine, Ser: 1.1 mg/dL — ABNORMAL HIGH (ref 0.44–1.00)
Creatinine, Ser: 1 mg/dL (ref 0.44–1.00)
Creatinine, Ser: 0.78 mg/dL (ref 0.44–1.00)
GFR calc Af Amer: 58 mL/min — ABNORMAL LOW (ref >60)
GFR calc Af Amer: >60 mL/min (ref >60)
GFR calc Af Amer: >60 mL/min (ref >60)
GFR calc non Af Amer: 50 mL/min — ABNORMAL LOW (ref >60)
GFR calc non Af Amer: 56 mL/min — ABNORMAL LOW (ref >60)
GFR calc non Af Amer: >60 mL/min (ref >60)
Glucose, Bld: 130 mg/dL — ABNORMAL HIGH (ref 65–99)
Glucose, Bld: 109 mg/dL — ABNORMAL HIGH (ref 65–99)
Glucose, Bld: 161 mg/dL — ABNORMAL HIGH (ref 65–99)
Potassium: 3.5 mmol/L (ref 3.5–5.1)
Potassium: 4 mmol/L (ref 3.5–5.1)
Potassium: 4.1 mmol/L (ref 3.5–5.1)
Sodium: 120 mmol/L — ABNORMAL LOW (ref 135–145)
Sodium: 124 mmol/L — ABNORMAL LOW (ref 135–145)
Sodium: 127 mmol/L — ABNORMAL LOW (ref 135–145)

## 2016-11-21 LAB — SODIUM, URINE, RANDOM: Sodium, Ur: 35 mmol/L

## 2016-11-21 LAB — OSMOLALITY, URINE: Osmolality, Ur: 184 mOsm/kg — ABNORMAL LOW (ref 300–900)

## 2016-11-21 LAB — CREATININE, URINE, RANDOM: Creatinine, Urine: 43.92 mg/dL

## 2016-11-21 LAB — TSH: TSH: 0.56 u[IU]/mL (ref 0.350–4.500)

## 2016-11-21 LAB — OSMOLALITY: Osmolality: 254 mOsm/kg — ABNORMAL LOW (ref 275–295)

## 2016-11-21 LAB — MRSA PCR SCREENING: MRSA by PCR: POSITIVE — AB

## 2016-11-21 MED ORDER — HYDRALAZINE HCL 25 MG PO TABS
25.0000 mg | ORAL_TABLET | Freq: Three times a day (TID) | ORAL | Status: DC
  Administered 2016-11-21 – 2016-11-22 (×2): 25 mg via ORAL
  Filled 2016-11-21 (×4): qty 1

## 2016-11-21 MED ORDER — ASPIRIN EC 81 MG PO TBEC
81.0000 mg | DELAYED_RELEASE_TABLET | Freq: Every day | ORAL | Status: DC
  Administered 2016-11-21 – 2016-11-22 (×2): 81 mg via ORAL
  Filled 2016-11-21 (×2): qty 1

## 2016-11-21 MED ORDER — ZOLPIDEM TARTRATE 5 MG PO TABS
5.0000 mg | ORAL_TABLET | Freq: Every evening | ORAL | Status: DC | PRN
  Administered 2016-11-21: 5 mg via ORAL
  Filled 2016-11-21: qty 1

## 2016-11-21 MED ORDER — SODIUM CHLORIDE 0.9 % IV SOLN: INTRAVENOUS | Status: DC

## 2016-11-21 MED ORDER — ATORVASTATIN CALCIUM 20 MG PO TABS
20.0000 mg | ORAL_TABLET | Freq: Every day | ORAL | Status: DC
  Administered 2016-11-21: 20 mg via ORAL
  Filled 2016-11-21 (×2): qty 1

## 2016-11-21 MED ORDER — ONDANSETRON HCL 4 MG PO TABS: 4.0000 mg | ORAL_TABLET | Freq: Four times a day (QID) | ORAL | Status: DC | PRN

## 2016-11-21 MED ORDER — BACITRACIN ZINC 500 UNIT/GM EX OINT
TOPICAL_OINTMENT | Freq: Two times a day (BID) | CUTANEOUS | Status: DC
  Administered 2016-11-21 – 2016-11-22 (×3): via TOPICAL
  Filled 2016-11-21 (×2): qty 28.35

## 2016-11-21 MED ORDER — METOPROLOL TARTRATE 25 MG PO TABS
25.0000 mg | ORAL_TABLET | Freq: Two times a day (BID) | ORAL | Status: DC
  Administered 2016-11-21 – 2016-11-22 (×3): 25 mg via ORAL
  Filled 2016-11-21 (×3): qty 1

## 2016-11-21 MED ORDER — LEVETIRACETAM 500 MG PO TABS
500.0000 mg | ORAL_TABLET | Freq: Two times a day (BID) | ORAL | Status: DC
  Administered 2016-11-21 – 2016-11-22 (×4): 500 mg via ORAL
  Filled 2016-11-21 (×4): qty 1

## 2016-11-21 MED ORDER — DIBUCAINE 1 % RE OINT
TOPICAL_OINTMENT | RECTAL | Status: DC | PRN
  Filled 2016-11-21: qty 28

## 2016-11-21 MED ORDER — VALACYCLOVIR HCL 500 MG PO TABS
500.0000 mg | ORAL_TABLET | Freq: Every day | ORAL | Status: DC
  Administered 2016-11-21 – 2016-11-22 (×2): 500 mg via ORAL
  Filled 2016-11-21 (×2): qty 1

## 2016-11-21 MED ORDER — ACETAMINOPHEN 650 MG RE SUPP: 650.0000 mg | Freq: Four times a day (QID) | RECTAL | Status: DC | PRN

## 2016-11-21 MED ORDER — DM-GUAIFENESIN ER 30-600 MG PO TB12: 1.0000 | ORAL_TABLET | Freq: Two times a day (BID) | ORAL | Status: DC | PRN

## 2016-11-21 MED ORDER — ADULT MULTIVITAMIN W/MINERALS CH
1.0000 | ORAL_TABLET | Freq: Every day | ORAL | Status: DC
  Administered 2016-11-21 – 2016-11-22 (×2): 1 via ORAL
  Filled 2016-11-21 (×2): qty 1

## 2016-11-21 MED ORDER — ONDANSETRON HCL 4 MG/2ML IJ SOLN
4.0000 mg | Freq: Four times a day (QID) | INTRAMUSCULAR | Status: DC | PRN
Start: 2016-11-21 — End: 2016-11-22

## 2016-11-21 MED ORDER — ALBUTEROL SULFATE (2.5 MG/3ML) 0.083% IN NEBU
2.5000 mg | INHALATION_SOLUTION | RESPIRATORY_TRACT | Status: DC | PRN
Start: 2016-11-21 — End: 2016-11-22
  Administered 2016-11-21 – 2016-11-22 (×2): 2.5 mg via RESPIRATORY_TRACT
  Filled 2016-11-21 (×2): qty 3

## 2016-11-21 MED ORDER — PANTOPRAZOLE SODIUM 40 MG PO TBEC
40.0000 mg | DELAYED_RELEASE_TABLET | Freq: Every day | ORAL | Status: DC
  Administered 2016-11-21 – 2016-11-22 (×2): 40 mg via ORAL
  Filled 2016-11-21 (×2): qty 1

## 2016-11-21 MED ORDER — HYDRALAZINE HCL 20 MG/ML IJ SOLN
5.0000 mg | INTRAMUSCULAR | Status: DC | PRN
  Filled 2016-11-21: qty 0.25

## 2016-11-21 MED ORDER — ENOXAPARIN SODIUM 40 MG/0.4ML ~~LOC~~ SOLN
40.0000 mg | Freq: Every day | SUBCUTANEOUS | Status: DC
  Administered 2016-11-21 (×2): 40 mg via SUBCUTANEOUS
  Filled 2016-11-21 (×2): qty 0.4

## 2016-11-21 MED ORDER — ACETAMINOPHEN 325 MG PO TABS: 650.0000 mg | ORAL_TABLET | Freq: Four times a day (QID) | ORAL | Status: DC | PRN

## 2016-11-21 MED ORDER — CITALOPRAM HYDROBROMIDE 20 MG PO TABS
10.0000 mg | ORAL_TABLET | Freq: Every day | ORAL | Status: DC
  Administered 2016-11-21 – 2016-11-22 (×2): 10 mg via ORAL
  Filled 2016-11-21 (×2): qty 1
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
## 2016-11-21 NOTE — Progress Notes (Addendum)
PROGRESS NOTE [facility]   Kim Blankenship   JKK:[id number] DOB: 03-20-1946  DOA: 11/20/2016 PCP: Kim Bayley, DO   Brief Narrative:  Kim Blankenship is a 71 year old female with medical history significant for hypertension, hyperlipidemia, COPD, stroke, depression, PR ES, A. fib not on anticoagulation, seizures and alcohol abuse presented to the emergency department complaining of generalized weakness, diarrhea and dysuria. Upon ED evaluation she was found to have hyponatremia and UA concerning for UTI and acute renal failure with creatinine of 1.3 for which patient was admitted for further treatment.  Subjective: Patient is seen and examined, she has no complaints this morning. Sodium up to 124, creatinine normalize. Denies dysuria and hematuria. Patient afebrile, tolerating diet well  Assessment & Plan: Hyponatremia: Initial sodium of 119, with no changes in mental status. This can be multifactorial including recent diarrhea, patient also on Hyzaar and spironolactone, but ammonia from alcohol abuse. Thyroid function is normal.  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 Patient has been referred to Onc/OBGYN for follow up. Paraneoplastic syndrome can be contributing to low Na+  UTI - UA grossly abnormal with positive nitrate and numerous WBC On Rocephin Urine culture pending  Acute renal failure: Likely secondary to dehydration and continuation of ARB and diuretics Prerenal Creatinine improving after IV hydration Avoid nephrotoxic agents  Essential hypertension Hyzaar and spironolactone hold Continue metoprolol Hydralazine as needed BP so far stable  A. Fib -  CHADSVASc 5 Not on oral anticoagulation, due to high risk of fall, alcohol abuse and seizures Heart rate is well controlled Continue metoprolol and aspirin  DVT prophylaxis: Lovenox Code Status: Full code Family Communication: None at bedside Disposition Plan: Home when medically stable, can transfer to medical floor  Consultants:   None  Procedures:   None  Antimicrobials: Anti-infectives    Start     Dose/Rate Route Frequency Ordered Stop   11/21/16 2200  cefTRIAXone (ROCEPHIN) 1 g in dextrose 5 % 50 mL IVPB     1 g 100 mL/hr over 30 Minutes Intravenous Every 24 hours 11/20/16 2359     11/21/16 1000  valACYclovir (VALTREX) tablet 500 mg     500 mg Oral Daily 11/21/16 0000     11/20/16 2315  cefTRIAXone (ROCEPHIN) 1 g in dextrose 5 % 50 mL IVPB     1 g 100 mL/hr over 30 Minutes Intravenous  Once 11/20/16 2312 11/20/16 2359       Objective: Vitals:   11/21/16 0100 11/21/16 0110 11/21/16 0302 11/21/16 0800  BP:  (!) 122/44  125/60  Pulse:  67  69  Resp:  12  13  Temp:   (!) 97.1 F (36.2 C)   TempSrc:   Axillary   SpO2:  97%  94%  Weight: 60.5 kg (133 lb 6.1 oz)     Height:        Intake/Output Summary (Last 24 hours) at 11/21/16 0828 Last data filed at 11/21/16 0500  Gross per 24 hour  Intake           427.91 ml  Output              600 ml  Net          -  172.09 ml   Filed Weights   11/20/16 2007 11/21/16 0100  Weight: 61.7 kg (136 lb) 60.5 kg (133 lb 6.1 oz)    Examination:  General exam: NAD HEENT: OP moist and clear, several bruises on full head  Respiratory system: Clear to auscultation. No wheezes,crackle or rhonchi Cardiovascular system: S1 & S2 heard, RRR. No JVD, murmurs, rubs or gallops Gastrointestinal system: Abdomen is nondistended, soft and nontender.  Central nervous system: Alert and oriented. No focal neurological deficits. Extremities: No pedal edema. Symmetric, strength 5/5   Skin: Some skin tears arm bilaterally and bruises    Psychiatry: Judgement and insight appear normal. Mood & affect appropriate.    Data Reviewed: I have personally reviewed following labs and imaging studies  CBC:  Recent Labs Lab 11/20/16 2112 11/20/16 2123 11/21/16 0543  WBC  --  9.0 8.7  HGB 10.2* 10.9* 11.0*  HCT 30.0* 29.3* 29.3*  MCV  --  83.2 84.2  PLT  --  262 932   Basic Metabolic Panel:  Recent Labs Lab 11/20/16 2112 11/20/16 2123 11/21/16 0049 11/21/16 0543  NA 118* 119* 120* 124*  K 4.0 4.1 3.5 4.0  CL 85* 88* 90* 96*  CO2  --  21* 21* 22  GLUCOSE 92 92 130* 109*  BUN 23* 23* 20 20  CREATININE 1.50* 1.38* 1.10* 1.00  CALCIUM  --  8.3* 8.3* 8.1*   GFR: Estimated Creatinine Clearance: 45.5 mL/min (by C-G formula based on SCr of 1 mg/dL). Liver Function Tests: No results for input(s): AST, ALT, ALKPHOS, BILITOT, PROT, ALBUMIN in the last 168 hours. No results for input(s): LIPASE, AMYLASE in the last 168 hours. No results for input(s): AMMONIA in the last 168 hours. Coagulation Profile: No results for input(s): INR, PROTIME in the last 168 hours. Cardiac Enzymes: No results for input(s): CKTOTAL, CKMB, CKMBINDEX, TROPONINI in the last 168 hours. BNP (last 3 results) No results for input(s): PROBNP in the last 8760 hours. HbA1C: No results for input(s): HGBA1C in the last 72 hours. CBG: No results for input(s): GLUCAP in the last 168 hours. Lipid Profile: No results for input(s): CHOL, HDL, LDLCALC, TRIG, CHOLHDL, LDLDIRECT in the last 72 hours. Thyroid Function Tests:  Recent Labs  11/21/16 0543  TSH 0.560   Anemia Panel: No results for input(s): VITAMINB12, FOLATE, FERRITIN, TIBC, IRON, RETICCTPCT in the last 72 hours. Sepsis Labs: No results for input(s): PROCALCITON, LATICACIDVEN in the last 168 hours.  No results found for this or any previous visit (from the past 240 hour(s)).    Radiology Studies: Ct Head Wo Contrast  Result Date: 11/20/2016 CLINICAL DATA:  Blunt head trauma.  EXAM: CT HEAD WITHOUT CONTRAST TECHNIQUE: Contiguous axial images were obtained from the base of the skull through the vertex without intravenous contrast. COMPARISON:  CT and MRI 10/30/2016 FINDINGS: Brain: Previous parasagittal left parietal-occipital region hypodensity exam has resolved in the interim. No intracranial hemorrhage, mass effect, or midline shift. No extra-axial or subdural fluid collection. Mild chronic small vessel ischemia is stable. No evidence of acute ischemia. No hydrocephalus, basilar cisterns are patent. Vascular: Atherosclerosis of skullbase vasculature without hyperdense vessel or abnormal calcification. Skull: No skull fracture.  No focal lesion. Sinuses/Orbits: Again seen fluid in left side of sphenoid sinus. Remaining sinuses are clear. Mastoid air cells are well-aerated. Other: Minimal left frontal scalp edema. IMPRESSION: 1. Minimal left frontal scalp edema, no skull fracture or acute intracranial abnormality. 2. Resolved left paracentral parietal-occipital hypodensity from prior exam. Electronically Signed  By: Jeb Levering M.D.   On: 11/20/2016 22:29      Scheduled Meds: . aspirin EC  81 mg Oral Daily  . atorvastatin  20 mg Oral q1800  . citalopram  10 mg Oral Daily  . enoxaparin (LOVENOX) injection  40 mg Subcutaneous QHS  . folic acid  1 mg Oral Daily  . hydrALAZINE  25 mg Oral Q8H  . levETIRAcetam  500 mg Oral BID  . LORazepam  0-4 mg Intravenous Q6H   Followed by  . [START ON 11/23/2016] LORazepam  0-4 mg Intravenous Q12H  . metoprolol tartrate  25 mg Oral BID  . multivitamin with minerals  1 tablet Oral Q breakfast  . pantoprazole  40 mg Oral Daily  . thiamine  100 mg Oral Daily   Or  . thiamine  100 mg Intravenous Daily  . valACYclovir  500 mg Oral Daily   Continuous Infusions: . cefTRIAXone (ROCEPHIN)  IV       LOS: 1 day    Time spent: Total of 25 minutes spent with pt, greater than 50% of which was spent in discussion of  treatment,  counseling and coordination of care    Chipper Oman, MD Pager: Text Page via [email or web address]   If 7PM-7AM, please contact night-coverage [email or web address] 11/21/2016, 8:28 AM

## 2016-11-22 DIAGNOSIS — N179 Acute kidney failure, unspecified: Principal | ICD-10-CM

## 2016-11-22 LAB — BASIC METABOLIC PANEL
Anion gap: 7 (ref 5–15)
BUN: 10 mg/dL (ref 6–20)
CO2: 24 mmol/L (ref 22–32)
Calcium: 8.7 mg/dL — ABNORMAL LOW (ref 8.9–10.3)
Chloride: 95 mmol/L — ABNORMAL LOW (ref 101–111)
Creatinine, Ser: 0.67 mg/dL (ref 0.44–1.00)
GFR calc Af Amer: >60 mL/min (ref >60)
GFR calc non Af Amer: >60 mL/min (ref >60)
Glucose, Bld: 147 mg/dL — ABNORMAL HIGH (ref 65–99)
Potassium: 3.3 mmol/L — ABNORMAL LOW (ref 3.5–5.1)
Sodium: 126 mmol/L — ABNORMAL LOW (ref 135–145)

## 2016-11-22 LAB — UREA NITROGEN, URINE: Urea Nitrogen, Ur: 162 mg/dL

## 2016-11-22 MED ORDER — POTASSIUM CHLORIDE CRYS ER 20 MEQ PO TBCR
60.0000 meq | EXTENDED_RELEASE_TABLET | Freq: Once | ORAL | Status: AC
  Administered 2016-11-22: 60 meq via ORAL
  Filled 2016-11-22: qty 3

## 2016-11-22 MED ORDER — SODIUM CHLORIDE 1 G PO TABS: 1.0000 g | ORAL_TABLET | Freq: Three times a day (TID) | ORAL | 0 refills | Status: AC

## 2016-11-22 MED ORDER — BUDESONIDE-FORMOTEROL FUMARATE 160-4.5 MCG/ACT IN AERO: 2.0000 | INHALATION_SPRAY | Freq: Two times a day (BID) | RESPIRATORY_TRACT | 0 refills | Status: DC

## 2016-11-22 MED ORDER — CEFPODOXIME PROXETIL 200 MG PO TABS: 200.0000 mg | ORAL_TABLET | Freq: Two times a day (BID) | ORAL | 0 refills | Status: AC

## 2016-11-22 MED ORDER — SODIUM CHLORIDE 1 G PO TABS
1.0000 g | ORAL_TABLET | Freq: Two times a day (BID) | ORAL | Status: DC
  Administered 2016-11-22: 1 g via ORAL
  Filled 2016-11-22 (×2): qty 1

## 2016-11-22 MED ORDER — SIMETHICONE 80 MG PO CHEW
160.0000 mg | CHEWABLE_TABLET | Freq: Four times a day (QID) | ORAL | Status: DC | PRN
  Administered 2016-11-22: 160 mg via ORAL
  Filled 2016-11-22: qty 2

## 2016-11-22 NOTE — Discharge Summary (Addendum)
Physician Discharge Summary  Kim Blankenship  YBO:[id number]  DOB: October 07, 1945  DOA: 11/20/2016 PCP: Glenford Bayley, DO  Admit date: 11/20/2016 Discharge date: 11/22/2016  Admitted From: Home  Disposition: Home patient will travel with family to [place]   Recommendations for Outpatient Follow-up:  1. Follow up with PCP in 1 weeks 2. Please obtain BMP/CBC in one week to monitor Na+, K and Hgb  3. Please follow up final urine cultures - E coli growing pending sensitivities  4. Need to follow up with Onc/OBGYN for vulvar neoplasia - biopsy done by Joneen Caraway  Discharge Condition: Stable  CODE STATUS: FULL  Diet recommendation: Heart Healthy   Brief/Interim Summary: Kim Blankenship is a 71 year old female with medical history significant for hypertension, hyperlipidemia, COPD, stroke, depression, PRES, A. fib not on anticoagulation, seizures and alcohol abuse presented to the emergency department complaining of generalized weakness, diarrhea and dysuria. Upon ED evaluation she was found to have hyponatremia and UA concerning for UTI and acute renal failure with creatinine of 1.3 for which patient was admitted for further treatment. Urine cultures showed E coli she was treated with ceftriaxone. Hyponatremia treated with NS and Na+ up to 127. Patient feel back to baseline, clinically stable and requested to be discharge so she can go to [place] with her family. Patient at this point is clinically stable, she is afebrile, denies weakness and tolerating diet well. Patient was discharge with Vantin and salt tablets. Her family has made arrangement for her to follow up with PCP when she arrives to [place].    Subjective: Patient seen and examined on the day of discharge, patient feeling back to normal, she has no complaints. Afebrile, tolerating diet well. Denies hematuria, dysuria, chest pain, sob weakness and dizziness.   Discharge Diagnoses/Hospital Course:  Hyponatremia: Initial sodium of 119, with no changes in  mental status. This can be multifactorial including recent diarrhea, patient also on Hyzaar, potomania from alcohol abuse. Keppra could has been another contributor.  Thyroid function is normal.  Hypotonic hyponatremia serum osmolalities 254, this correlate with acute renal failure and HCTZ Na+ up to 127 and asymptomatic will discharge with salt tablet for 3 days, patient advised to follow up with PCP in 1 week to repeat levels.  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 Patient has been referred to Onc/OBGYN for follow up. Paraneoplastic syndrome can be contributing to low Na+ as well.   UTI - UA grossly abnormal with positive nitrate and numerous WBC Urine culture growth E. Coli - sensitivities pending - patient clinically responding to rocephin  Treated with rocephin - will d/c on Vantin to complete 7 days  Blood cultures no growth thus far   Acute renal failure: secondary to dehydration and continuation of ARB and diuretics Prerenal Cr normalized  Avoid nephrotoxic agents Hyzaar dc/ed  Essential hypertension Hyzaar has been d/c due to hypoNa+  Continue aldactone, hydralazine and lopressor, if another medication need, may add Norvasc  BP stable during hospital stay   A. Fib - CHADSVASc 5 Not on oral anticoagulation, due to high risk of fall, alcohol abuse and seizures - need to discuss with PCP  Heart rate is well controlled Continue metoprolol and aspirin  All other chronic medical condition were stable during the hospitalization.  On the day of the discharge the patient's vitals were stable, and no other acute medical condition were reported by  patient. Patient was felt safe to be discharge to home   Discharge Instructions  You were cared for by a hospitalist during your hospital stay. If you have any questions about your discharge medications or the care you  received while you were in the hospital after you are discharged, you can call the unit and asked to speak with the hospitalist on call if the hospitalist that took care of you is not available. Once you are discharged, your primary care physician will handle any further medical issues. Please note that NO REFILLS for any discharge medications will be authorized once you are discharged, as it is imperative that you return to your primary care physician (or establish a relationship with a primary care physician if you do not have one) for your aftercare needs so that they can reassess your need for medications and monitor your lab values.  Discharge Instructions    Call MD for:  difficulty breathing, headache or visual disturbances    Complete by:  As directed    Call MD for:  extreme fatigue    Complete by:  As directed    Call MD for:  hives    Complete by:  As directed    Call MD for:  persistant dizziness or light-headedness    Complete by:  As directed    Call MD for:  persistant nausea and vomiting    Complete by:  As directed    Call MD for:  redness, tenderness, or signs of infection (pain, swelling, redness, odor or green/yellow discharge around incision site)    Complete by:  As directed    Call MD for:  severe uncontrolled pain    Complete by:  As directed    Call MD for:  temperature >100.4    Complete by:  As directed    Diet - low sodium heart healthy    Complete by:  As directed    Increase activity slowly    Complete by:  As directed      Allergies as of 11/22/2016      Reactions   Tape Other (See Comments)   THE PATIENT'S SKIN IS THIN AND TEARS VERY EASILY (she "picks" at it; please use coban wrap)      Medication List    STOP taking these medications   ibuprofen 200 MG tablet Commonly known as:  ADVIL,MOTRIN   losartan-hydrochlorothiazide 100-25 MG tablet Commonly known as:  HYZAAR     TAKE these medications   aspirin EC 81 MG tablet Take 81 mg by mouth  daily.   atorvastatin 20 MG tablet Commonly known as:  LIPITOR Take 20 mg by mouth daily.   budesonide-formoterol 160-4.5 MCG/ACT inhaler Commonly known as:  SYMBICORT Inhale 2 puffs into the lungs 2 (two) times daily.   cefpodoxime 200 MG tablet Commonly known as:  VANTIN Take 1 tablet (200 mg total) by mouth 2 (two) times daily.   citalopram 10 MG tablet Commonly known as:  CELEXA Take 10 mg by mouth daily.   esomeprazole 20 MG capsule Commonly known as:  NEXIUM Take 20 mg by mouth daily at 12 noon.   hydrALAZINE 10 MG tablet Commonly known as:  APRESOLINE Take 1 tablet (10 mg total) by mouth every 8 (eight) hours.   levETIRAcetam 500 MG tablet Commonly known as:  KEPPRA Take 1 tablet (500 mg total) by mouth 2 (two) times daily.   metoprolol tartrate 25 MG tablet Commonly known as:  LOPRESSOR Take 1 tablet (25  mg total) by mouth 2 (two) times daily.   multivitamin tablet Take 1 tablet by mouth daily with breakfast.   sodium chloride 1 g tablet Take 1 tablet (1 g total) by mouth 3 (three) times daily with meals.   spironolactone 25 MG tablet Commonly known as:  ALDACTONE Take 25 mg by mouth daily.   valACYclovir 500 MG tablet Commonly known as:  VALTREX Take 500 mg by mouth daily.            Discharge Care Instructions        Start     Ordered   11/22/16 0000  sodium chloride 1 g tablet  3 times daily with meals     11/22/16 1523   11/22/16 0000  cefpodoxime (VANTIN) 200 MG tablet  2 times daily     11/22/16 1523   11/22/16 0000  budesonide-formoterol (SYMBICORT) 160-4.5 MCG/ACT inhaler  2 times daily     11/22/16 1523   11/22/16 0000  Increase activity slowly     11/22/16 1523   11/22/16 0000  Diet - low sodium heart healthy     11/22/16 1523   11/22/16 0000  Call MD for:  temperature >100.4     11/22/16 1523   11/22/16 0000  Call MD for:  persistant nausea and vomiting     11/22/16 1523   11/22/16 0000  Call MD for:  severe uncontrolled pain      11/22/16 1523   11/22/16 0000  Call MD for:  redness, tenderness, or signs of infection (pain, swelling, redness, odor or green/yellow discharge around incision site)     11/22/16 1523   11/22/16 0000  Call MD for:  difficulty breathing, headache or visual disturbances     11/22/16 1523   11/22/16 0000  Call MD for:  hives     11/22/16 1523   11/22/16 0000  Call MD for:  persistant dizziness or light-headedness     11/22/16 1523   11/22/16 0000  Call MD for:  extreme fatigue     11/22/16 1523     Follow-up Information    Le, Thao P, DO. Schedule an appointment as soon as possible for a visit.   Specialty:  Family Medicine Why:  Hospital follow up  Contact information: [place] [address] [phone]          Allergies  Allergen Reactions  . Tape Other (See Comments)    THE PATIENT'S SKIN IS THIN AND TEARS VERY EASILY (she "picks" at it; please use coban wrap)    Consultations:  None    Procedures/Studies: Ct Head Wo Contrast  Result Date: 11/20/2016 CLINICAL DATA:  Blunt head trauma. EXAM: CT HEAD WITHOUT CONTRAST TECHNIQUE: Contiguous axial images were obtained from the base of the skull through the vertex without intravenous contrast. COMPARISON:  CT and MRI 10/30/2016 FINDINGS: Brain: Previous parasagittal left parietal-occipital region hypodensity exam has resolved in the interim. No intracranial hemorrhage, mass effect, or midline shift. No extra-axial or subdural fluid collection. Mild chronic small vessel ischemia is stable. No evidence of acute ischemia. No hydrocephalus, basilar cisterns are patent. Vascular: Atherosclerosis of skullbase vasculature without hyperdense vessel or abnormal calcification. Skull: No skull fracture.  No focal lesion. Sinuses/Orbits: Again seen fluid in left side of sphenoid sinus. Remaining sinuses are clear. Mastoid air cells are well-aerated. Other: Minimal left frontal scalp edema. IMPRESSION: 1. Minimal left  frontal scalp edema, no skull fracture or acute intracranial abnormality. 2. Resolved  left paracentral parietal-occipital hypodensity from prior exam. Electronically Signed   By: Jeb Levering M.D.   On: 11/20/2016 22:29   Ct Head Wo Contrast  Result Date: 10/30/2016 CLINICAL DATA:  Initial evaluation for acute altered mental status, seizure. EXAM: CT HEAD WITHOUT CONTRAST TECHNIQUE: Contiguous axial images were obtained from the base of the skull through the vertex without intravenous contrast. COMPARISON:  Prior CT from 04/05/2016. FINDINGS: Brain: Cerebral volume within normal limits, and stable from previous. There is subtle patchy hypodensity within the parasagittal left parieto-occipital region (series 3, image 23), not definitely seen on previous. Additional possible vague hypodensity within the adjacent parasagittal right parieto-occipital lobe as well. Findings are nonspecific, but could reflect sequelae of PRES given history of seizure. Underlying mass not excluded. Few additional scattered subcortical hypodensities involving the bilateral frontal lobes are grossly similar to previous, and may be related underlying chronic microvascular ischemic changes. No acute intracranial hemorrhage. No other evidence for acute large vessel territory infarct. No other mass lesion or midline shift. No hydrocephalus. No extra-axial fluid collection. Vascular: No hyperdense vessel. Scattered vascular calcifications noted within the carotid siphons. Skull: Scalp soft tissues and calvarium within normal limits. Sinuses/Orbits: Globes and orbital soft tissues within normal limits. Mild scattered mucosal thickening within the left sphenoid and maxillary sinuses as well the ethmoidal air cells. No mastoid effusion. IMPRESSION: Patchy hypodensity within the parasagittal left parietal lobe, new from prior. Given the history of seizure, finding may reflect sequelae of PRES. Underlying mass not entirely excluded. Further  evaluation with brain MRI, with and without gadolinium, recommended. Findings discussed by telephone at the time of interpretation on 10/30/2016 at approximately 1:45 a.m. with Dr. Kathrynn Speed. Electronically Signed   By: Jeannine Boga M.D.   On: 10/30/2016 02:04   Mr Brain W And Wo Contrast  Result Date: 10/30/2016 CLINICAL DATA:  Initial evaluation for acute seizure. EXAM: MRI HEAD WITHOUT AND WITH CONTRAST TECHNIQUE: Multiplanar, multiecho pulse sequences of the brain and surrounding structures were obtained without and with intravenous contrast. CONTRAST:  87mL MULTIHANCE GADOBENATE DIMEGLUMINE 529 MG/ML IV SOLN COMPARISON:  Prior head CT from earlier the same day. FINDINGS: Brain: Cerebral volume within normal limits for age. Patchy T2/FLAIR hyperintensity involving the periventricular and deep white matter both cerebral hemispheres most consistent with chronic small vessel ischemic disease, overall stable from previous. Multifocal patchy T2/FLAIR signal abnormality seen involving the cortical gray matter and underlying subcortical deep white matter of the bilateral parieto-occipital regions in a distribution most consistent with PRES. Associated mild patchy diffusion abnormality. No associated hemorrhage or abnormal enhancement. No other evidence for acute infarct. No mass lesion, midline shift or mass effect. No hydrocephalus. No extra-axial fluid collection. Major dural sinuses are patent. No other abnormal enhancement. Pituitary gland suprasellar region within normal limits. Vascular: Major intracranial vascular flow voids are maintained. Skull and upper cervical spine: Cerebellar tonsils mildly low lying within the foramen magnum without frank Chiari malformation. Mild degenerate spondylolysis noted within upper cervical spine without significant stenosis. Bone marrow signal intensity within normal limits. No scalp soft tissue abnormality. Sinuses/Orbits: Globes and orbital soft tissues  within normal limits. Mild scattered mucosal thickening within the paranasal sinuses. Air-fluid level noted within the left sphenoid sinus. No significant mastoid effusion. Inner ear structures within normal limits. IMPRESSION: 1. Findings consistent with PRES as above. 2. Otherwise stable underlying chronic microvascular ischemic disease. 3. Acute left sphenoid sinusitis. Electronically Signed   By: Jeannine Boga M.D.   On: 10/30/2016 06:08  Dg Chest Portable 1 View  Result Date: 10/30/2016 CLINICAL DATA:  Altered mental status EXAM: PORTABLE CHEST 1 VIEW COMPARISON:  04/05/2016 FINDINGS: Shallow inspiration. Heart size and pulmonary vascularity are normal. Probable emphysematous changes in the lungs. No airspace disease or consolidation. No blunting of costophrenic angles. No pneumothorax. Calcified and tortuous aorta. IMPRESSION: Shallow inspiration. Probable emphysematous changes in the lungs. Aortic atherosclerosis. No active pulmonary disease. Electronically Signed   By: Lucienne Capers M.D.   On: 10/30/2016 00:45    Discharge Exam: Vitals:   11/22/16 0855 11/22/16 1324  BP:  (!) 147/69  Pulse:  64  Resp: 18 16  Temp:  99.2 F (37.3 C)  SpO2: 95% 96%   Vitals:   11/21/16 2054 11/22/16 0500 11/22/16 0855 11/22/16 1324  BP: 138/72 (!) 128/92  (!) 147/69  Pulse: 79 62  64  Resp: 18 18 18 16   Temp: 98.3 F (36.8 C) 98.5 F (36.9 C)  99.2 F (37.3 C)  TempSrc: Oral Oral  Oral  SpO2: 96% 99% 95% 96%  Weight:      Height:        General: Pt is alert, awake, not in acute distress Cardiovascular: RRR, S1/S2 +, no rubs, no gallops Respiratory: CTA bilaterally, no wheezing, no rhonchi Abdominal: Soft, NT, ND, bowel sounds + Extremities: no edema, no cyanosis   The results of significant diagnostics from this hospitalization (including imaging, microbiology, ancillary and laboratory) are listed below for reference.     Microbiology: Recent Results (from the past 240  hour(s))  Urine Culture     Status: Abnormal (Preliminary result)   Collection Time: 11/20/16 10:12 PM  Result Value Ref Range Status   Specimen Description URINE, RANDOM  Final   Special Requests NONE  Final   Culture >=100,000 COLONIES/mL ESCHERICHIA COLI (A)  Final   Report Status PENDING  Incomplete  Culture, blood (Routine X 2) w Reflex to ID Panel     Status: None (Preliminary result)   Collection Time: 11/21/16 12:49 AM  Result Value Ref Range Status   Specimen Description BLOOD LEFT ARM  Final   Special Requests   Final    BOTTLES DRAWN AEROBIC AND ANAEROBIC Blood Culture adequate volume   Culture   Final    NO GROWTH 1 DAY Performed at [facility], [place] [address], [place], [place] [address]    Report Status PENDING  Incomplete  Culture, blood (Routine X 2) w Reflex to ID Panel     Status: None (Preliminary result)   Collection Time: 11/21/16 12:52 AM  Result Value Ref Range Status   Specimen Description BLOOD RIGHT ARM  Final   Special Requests   Final    BOTTLES DRAWN AEROBIC AND ANAEROBIC Blood Culture adequate volume   Culture   Final    NO GROWTH 1 DAY Performed at [facility], [place] [address] [place], [place] [address]    Report Status PENDING  Incomplete  MRSA PCR Screening     Status: Abnormal   Collection Time: 11/21/16 10:09 AM  Result Value Ref Range Status   MRSA by PCR POSITIVE (A) NEGATIVE Final    Comment:        The GeneXpert MRSA Assay (FDA approved for NASAL specimens only), is one component of a comprehensive MRSA colonization surveillance program. It is not intended to diagnose MRSA infection nor to guide or monitor treatment for MRSA infections. RESULT CALLED TO, READ BACK BY AND VERIFIED WITH: MAY,B @  1310 ON [id number] BY POTEAT,S      Labs: BNP (last 3 results) No results for input(s): BNP in the last 8760 hours. Basic Metabolic Panel:  Recent Labs Lab 11/20/16 2123 11/21/16 0049 11/21/16 0543 11/21/16 1204  11/22/16 0821  NA 119* 120* 124* 127* 126*  K 4.1 3.5 4.0 4.1 3.3*  CL 88* 90* 96* 98* 95*  CO2 21* 21* 22 24 24   GLUCOSE 92 130* 109* 161* 147*  BUN 23* 20 20 14 10   CREATININE 1.38* 1.10* 1.00 0.78 0.67  CALCIUM 8.3* 8.3* 8.1* 8.7* 8.7*   Liver Function Tests: No results for input(s): AST, ALT, ALKPHOS, BILITOT, PROT, ALBUMIN in the last 168 hours. No results for input(s): LIPASE, AMYLASE in the last 168 hours. No results for input(s): AMMONIA in the last 168 hours. CBC:  Recent Labs Lab 11/20/16 2112 11/20/16 2123 11/21/16 0543  WBC  --  9.0 8.7  HGB 10.2* 10.9* 11.0*  HCT 30.0* 29.3* 29.3*  MCV  --  83.2 84.2  PLT  --  262 265   Cardiac Enzymes: No results for input(s): CKTOTAL, CKMB, CKMBINDEX, TROPONINI in the last 168 hours. BNP: Invalid input(s): POCBNP CBG: No results for input(s): GLUCAP in the last 168 hours. D-Dimer No results for input(s): DDIMER in the last 72 hours. Hgb A1c No results for input(s): HGBA1C in the last 72 hours. Lipid Profile No results for input(s): CHOL, HDL, LDLCALC, TRIG, CHOLHDL, LDLDIRECT in the last 72 hours. Thyroid function studies  Recent Labs  11/21/16 0543  TSH 0.560   Anemia work up No results for input(s): VITAMINB12, FOLATE, FERRITIN, TIBC, IRON, RETICCTPCT in the last 72 hours. Urinalysis    Component Value Date/Time   COLORURINE YELLOW 11/20/2016 2212   APPEARANCEUR CLOUDY (A) 11/20/2016 2212   LABSPEC 1.006 11/20/2016 2212   PHURINE 5.0 11/20/2016 2212   GLUCOSEU NEGATIVE 11/20/2016 2212   HGBUR MODERATE (A) 11/20/2016 2212   BILIRUBINUR NEGATIVE 11/20/2016 2212   KETONESUR NEGATIVE 11/20/2016 2212   PROTEINUR NEGATIVE 11/20/2016 2212   NITRITE POSITIVE (A) 11/20/2016 2212   LEUKOCYTESUR LARGE (A) 11/20/2016 2212   Sepsis Labs Invalid input(s): PROCALCITONIN,  WBC,  LACTICIDVEN Microbiology Recent Results (from the past 240 hour(s))  Urine Culture     Status: Abnormal (Preliminary result)   Collection  Time: 11/20/16 10:12 PM  Result Value Ref Range Status   Specimen Description URINE, RANDOM  Final   Special Requests NONE  Final   Culture >=100,000 COLONIES/mL ESCHERICHIA COLI (A)  Final   Report Status PENDING  Incomplete  Culture, blood (Routine X 2) w Reflex to ID Panel     Status: None (Preliminary result)   Collection Time: 11/21/16 12:49 AM  Result Value Ref Range Status   Specimen Description BLOOD LEFT ARM  Final   Special Requests   Final    BOTTLES DRAWN AEROBIC AND ANAEROBIC Blood Culture adequate volume   Culture   Final    NO GROWTH 1 DAY Performed at [facility], [place] [address], [place], [place] [address]    Report Status PENDING  Incomplete  Culture, blood (Routine X 2) w Reflex to ID Panel     Status: None (Preliminary result)   Collection Time: 11/21/16 12:52 AM  Result Value Ref Range Status   Specimen Description BLOOD RIGHT ARM  Final   Special Requests   Final    BOTTLES DRAWN AEROBIC AND ANAEROBIC Blood Culture adequate volume   Culture   Final  NO GROWTH 1 DAY Performed at [facility], [place] [address], [place], [place] [address]    Report Status PENDING  Incomplete  MRSA PCR Screening     Status: Abnormal   Collection Time: 11/21/16 10:09 AM  Result Value Ref Range Status   MRSA by PCR POSITIVE (A) NEGATIVE Final    Comment:        The GeneXpert MRSA Assay (FDA approved for NASAL specimens only), is one component of a comprehensive MRSA colonization surveillance program. It is not intended to diagnose MRSA infection nor to guide or monitor treatment for MRSA infections. RESULT CALLED TO, READ BACK BY AND VERIFIED WITH: MAY,B @ 1310 ON [id number] BY POTEAT,S      Time coordinating discharge: 35 minutes  SIGNED:  Chipper Oman, MD  [facility] 11/22/2016, 3:23 PM  Pager please text page via  [email or web address] Password [email or web address]

## 2016-11-23 LAB — URINE CULTURE: Culture: >=100000 — AB

## 2016-11-23 LAB — LEVETIRACETAM LEVEL: Levetiracetam Lvl: 22 ug/mL (ref 10.0–40.0)

## 2016-11-26 LAB — CULTURE, BLOOD (ROUTINE X 2)
Culture: NO GROWTH
Culture: NO GROWTH
Special Requests: ADEQUATE
Special Requests: ADEQUATE

## 2016-11-29 ENCOUNTER — Ambulatory Visit: Payer: Medicare Other | Admitting: Gynecology

## 2017-03-13 ENCOUNTER — Other Ambulatory Visit: Payer: Self-pay

## 2017-03-13 ENCOUNTER — Emergency Department (HOSPITAL_COMMUNITY)
Admission: EM | Admit: 2017-03-13 | Discharge: 2017-03-13 | Disposition: A | Discharge status: Home / Self Care | Payer: Medicare Other | Attending: Emergency Medicine | Admitting: Emergency Medicine

## 2017-03-13 ENCOUNTER — Emergency Department (HOSPITAL_COMMUNITY): Payer: Medicare Other

## 2017-03-13 ENCOUNTER — Encounter (HOSPITAL_COMMUNITY): Payer: Self-pay | Admitting: Emergency Medicine

## 2017-03-13 DIAGNOSIS — Z7982 Long term (current) use of aspirin: Secondary | ICD-10-CM | POA: Insufficient documentation

## 2017-03-13 DIAGNOSIS — F4325 Adjustment disorder with mixed disturbance of emotions and conduct: Secondary | ICD-10-CM | POA: Diagnosis present

## 2017-03-13 DIAGNOSIS — J45909 Unspecified asthma, uncomplicated: Secondary | ICD-10-CM | POA: Diagnosis not present

## 2017-03-13 DIAGNOSIS — T50904A Poisoning by unspecified drugs, medicaments and biological substances, undetermined, initial encounter: Secondary | ICD-10-CM | POA: Diagnosis present

## 2017-03-13 DIAGNOSIS — I10 Essential (primary) hypertension: Secondary | ICD-10-CM | POA: Insufficient documentation

## 2017-03-13 DIAGNOSIS — J449 Chronic obstructive pulmonary disease, unspecified: Secondary | ICD-10-CM | POA: Insufficient documentation

## 2017-03-13 DIAGNOSIS — F329 Major depressive disorder, single episode, unspecified: Secondary | ICD-10-CM | POA: Diagnosis not present

## 2017-03-13 DIAGNOSIS — Z96641 Presence of right artificial hip joint: Secondary | ICD-10-CM | POA: Insufficient documentation

## 2017-03-13 DIAGNOSIS — Z79899 Other long term (current) drug therapy: Secondary | ICD-10-CM | POA: Diagnosis not present

## 2017-03-13 LAB — COMPREHENSIVE METABOLIC PANEL
ALT: 19 U/L (ref 14–54)
AST: 27 U/L (ref 15–41)
Albumin: 4 g/dL (ref 3.5–5.0)
Alkaline Phosphatase: 74 U/L (ref 38–126)
Anion gap: 8 (ref 5–15)
BUN: 10 mg/dL (ref 6–20)
CO2: 25 mmol/L (ref 22–32)
Calcium: 9.2 mg/dL (ref 8.9–10.3)
Chloride: 105 mmol/L (ref 101–111)
Creatinine, Ser: 0.7 mg/dL (ref 0.44–1.00)
GFR calc Af Amer: >60 mL/min (ref >60)
GFR calc non Af Amer: >60 mL/min (ref >60)
Glucose, Bld: 87 mg/dL (ref 65–99)
Potassium: 3.9 mmol/L (ref 3.5–5.1)
Sodium: 138 mmol/L (ref 135–145)
Total Bilirubin: 0.6 mg/dL (ref 0.3–1.2)
Total Protein: 6.8 g/dL (ref 6.5–8.1)

## 2017-03-13 LAB — CBC
HCT: 35.7 % — ABNORMAL LOW (ref 36.0–46.0)
Hemoglobin: 12.3 g/dL (ref 12.0–15.0)
MCH: 33.2 pg (ref 26.0–34.0)
MCHC: 34.5 g/dL (ref 30.0–36.0)
MCV: 96.5 fL (ref 78.0–100.0)
Platelets: 252 10*3/uL (ref 150–400)
RBC: 3.7 MIL/uL — ABNORMAL LOW (ref 3.87–5.11)
RDW: 13 % (ref 11.5–15.5)
WBC: 6.2 10*3/uL (ref 4.0–10.5)

## 2017-03-13 LAB — RAPID URINE DRUG SCREEN, HOSP PERFORMED
Amphetamines: NOT DETECTED
Barbiturates: NOT DETECTED
Benzodiazepines: NOT DETECTED
Cocaine: NOT DETECTED
Opiates: NOT DETECTED
Tetrahydrocannabinol: NOT DETECTED

## 2017-03-13 LAB — ETHANOL: Alcohol, Ethyl (B): 12 mg/dL — ABNORMAL HIGH (ref <10)

## 2017-03-13 LAB — ACETAMINOPHEN LEVEL
Acetaminophen (Tylenol), Serum: <10 ug/mL — ABNORMAL LOW (ref 10–30)
Acetaminophen (Tylenol), Serum: <10 ug/mL — ABNORMAL LOW (ref 10–30)

## 2017-03-13 LAB — SALICYLATE LEVEL
Salicylate Lvl: <7 mg/dL (ref 2.8–30.0)
Salicylate Lvl: <7 mg/dL (ref 2.8–30.0)

## 2017-03-13 LAB — CBG MONITORING, ED: Glucose-Capillary: 88 mg/dL (ref 65–99)

## 2017-03-13 MED ORDER — ACETAMINOPHEN 325 MG PO TABS: 650.0000 mg | ORAL_TABLET | ORAL | Status: DC | PRN

## 2017-03-13 NOTE — ED Notes (Signed)
Pt has called daughter multiple times with no answer. Pt currently does not have a ride. Pt has had a stroke as recently as Aug 2018 and it not cognitive enough to ride bus.

## 2017-03-13 NOTE — ED Notes (Signed)
Pt states that she has called her daughter to come and pick her up over an hr ago. No one in waiting room is answering to page for this pt.

## 2017-03-13 NOTE — ED Provider Notes (Signed)
Patient reevaluated.  She is alert and oriented x3 without neurological deficits.  She states she took 1 Seroquel tablet as a sleeping pill and 1 glass of wine last night.  She is not suicidal or homicidal.   Nat Christen, MD 03/13/17 1220

## 2017-03-13 NOTE — ED Provider Notes (Addendum)
[place] DEPT Provider Note   CSN: [id number] Arrival date & time: 03/13/17  0154     History   Chief Complaint Chief Complaint  Patient presents with  . Drug Overdose    HPI Kim Blankenship is a 71 y.o. female.  The history is provided by the patient, the EMS personnel and a relative.  Drug Overdose  This is a new problem. The current episode started 1 to 2 hours ago. The problem occurs constantly. The problem has not changed since onset.Pertinent negatives include no chest pain, no abdominal pain, no headaches and no shortness of breath. Nothing aggravates the symptoms. Nothing relieves the symptoms. She has tried nothing for the symptoms. The treatment provided moderate relief.  Patient reports she took one seroquel that was her daughters to sleep EMS reported they were called out by daughter who found patient minimally responsive and more than 8 tablets were missing and she used this medication with ETOH.    Past Medical History:  Diagnosis Date  . Asthma   . Atrial fibrillation ([place])   . COPD (chronic obstructive pulmonary disease) ([place])   . Hypertension   . Irregular heart rate   . PRES (posterior reversible encephalopathy syndrome)   . Stroke [facility])     Patient Active Problem List   Diagnosis Date Noted  . AKI (acute kidney injury) ([place]) 11/21/2016  . Stroke (cerebrum) ([place]) 11/21/2016  . Hyponatremia 11/20/2016  . UTI (urinary tract infection) 11/20/2016  . HLD (hyperlipidemia) 11/20/2016  . GERD (gastroesophageal reflux disease) 11/20/2016  . Depression 11/20/2016  . Hypertensive emergency 11/04/2016  . EtOH dependence ([place]) 11/04/2016  . Hyperglycemia 11/04/2016  . Essential hypertension   . Hypertensive urgency 10/30/2016  . PAF (paroxysmal atrial fibrillation) ([place]) 10/30/2016  . PRES (posterior reversible encephalopathy syndrome) 10/30/2016  . Seizure ([place])   . Acute blood loss anemia   . Closed fracture of right hip  ([place]) 02/09/2016  . Chest pain 11/07/2015  . COPD (chronic obstructive pulmonary disease) ([place]) 08/19/2013  . COPD exacerbation ([place]) 08/19/2013  . Abnormal EKG 08/19/2013  . Fever, unspecified 08/19/2013    Past Surgical History:  Procedure Laterality Date  . CESAREAN SECTION    . Left Foot Reconstruction    . OPEN REDUCTION INTERNAL FIXATION (ORIF) METACARPAL Right 03/10/2015   Procedure: OPEN REDUCTION INTERNAL FIXATION (ORIF) RIGHT LONG  METACARPAL FRACTURE;  Surgeon: Leanora Cover, MD;  Location: [facility];  Service: Orthopedics;  Laterality: Right;  . TONSILLECTOMY    . TOTAL HIP ARTHROPLASTY Right 02/09/2016   Procedure: TOTAL HIP ARTHROPLASTY ANTERIOR APPROACH;  Surgeon: Frederik Pear, MD;  [facility];  Service: Orthopedics;  Laterality: Right;    OB History    Gravida Para Term Preterm AB Living             1   SAB TAB Ectopic Multiple Live Births                   Home Medications    Prior to Admission medications   Medication Sig Start Date End Date Taking? Authorizing Provider  aspirin EC 81 MG tablet Take 81 mg by mouth daily.   Yes [provider]  atorvastatin (LIPITOR) 20 MG tablet Take 20 mg by mouth daily. 04/08/16  Yes [provider]  citalopram (CELEXA) 10 MG tablet Take 10 mg by mouth daily.   Yes [provider]  gabapentin (NEURONTIN) 300 MG capsule Take 300-600 mg by mouth 2 (  two) times daily. 300MG  in the morning and 600MG  in the evening   Yes [provider]  levETIRAcetam (KEPPRA) 500 MG tablet Take 1 tablet (500 mg total) by mouth 2 (two) times daily. 11/06/16  Yes Florencia Reasons, MD  metoprolol tartrate (LOPRESSOR) 25 MG tablet Take 1 tablet (25 mg total) by mouth 2 (two) times daily. Patient taking differently: Take 25-50 mg by mouth 2 (two) times daily. 50MG  in the morning and 25MG  in the evening 11/06/16  Yes Florencia Reasons, MD  spironolactone (ALDACTONE) 25 MG tablet Take 25 mg by mouth daily.   Yes [provider]    Family History Family History  Problem Relation Age of Onset  . Dementia Father     Social History Social History   Tobacco Use  . Smoking status: Former Smoker    Types: Cigarettes  . Smokeless tobacco: Never Used  Substance Use Topics  . Alcohol use: Yes    Comment: wine daily  . Drug use: No     Allergies   Tape   Review of Systems Review of Systems  HENT: Negative for congestion and sinus pain.   Respiratory: Negative for shortness of breath.   Cardiovascular: Negative for chest pain.  Gastrointestinal: Negative for abdominal pain.  Musculoskeletal: Negative for back pain.  Neurological: Negative for headaches.  Psychiatric/Behavioral: The patient is not nervous/anxious and is not hyperactive.   All other systems reviewed and are negative.    Physical Exam Updated Vital Signs BP (!) 188/125 (BP Location: Left Arm)   Pulse 67   Temp (!) 97.5 F (36.4 C) (Oral)   Resp 16   SpO2 97%   Physical Exam  Constitutional: She appears well-developed and well-nourished.  Sleepy but is awake and conversant.    HENT:  Head: Normocephalic and atraumatic.  Mouth/Throat: No oropharyngeal exudate.  Eyes: Conjunctivae are normal. Pupils are equal, round, and reactive to light.  Neck: Normal range of motion. Neck supple.  Cardiovascular: Normal rate, regular rhythm, normal heart sounds and intact distal pulses.  Pulmonary/Chest: Effort normal and breath sounds normal. No stridor. She has no wheezes. She has no rales.  Abdominal: Soft. Bowel sounds are normal. She exhibits no mass. There is no tenderness. There is no rebound and no guarding.  Musculoskeletal: Normal range of motion.  Neurological: She is alert. She displays normal reflexes. No cranial nerve deficit. Coordination normal.  Skin: Skin is warm and dry. Capillary refill takes less than 2 seconds.  Psychiatric: She is not aggressive and not withdrawn.     ED Treatments / Results   Labs (all labs ordered are listed, but only abnormal results are displayed)  Results for orders placed or performed during the hospital encounter of 03/13/17  Comprehensive metabolic panel  Result Value Ref Range   Sodium 138 135 - 145 mmol/L   Potassium 3.9 3.5 - 5.1 mmol/L   Chloride 105 101 - 111 mmol/L   CO2 25 22 - 32 mmol/L   Glucose, Bld 87 65 - 99 mg/dL   BUN 10 6 - 20 mg/dL   Creatinine, Ser 0.70 0.44 - 1.00 mg/dL   Calcium 9.2 8.9 - 10.3 mg/dL   Total Protein 6.8 6.5 - 8.1 g/dL   Albumin 4.0 3.5 - 5.0 g/dL   AST 27 15 - 41 U/L   ALT 19 14 - 54 U/L   Alkaline Phosphatase 74 38 - 126 U/L   Total Bilirubin 0.6 0.3 - 1.2 mg/dL   GFR calc  non Af Amer >60 >60 mL/min   GFR calc Af Amer >60 >60 mL/min   Anion gap 8 5 - 15  Ethanol  Result Value Ref Range   Alcohol, Ethyl (B) 12 (H) <91 mg/dL  Salicylate level  Result Value Ref Range   Salicylate Lvl <6.3 2.8 - 30.0 mg/dL  Acetaminophen level  Result Value Ref Range   Acetaminophen (Tylenol), Serum <10 (L) 10 - 30 ug/mL  cbc  Result Value Ref Range   WBC 6.2 4.0 - 10.5 K/uL   RBC 3.70 (L) 3.87 - 5.11 MIL/uL   Hemoglobin 12.3 12.0 - 15.0 g/dL   HCT 35.7 (L) 36.0 - 46.0 %   MCV 96.5 78.0 - 100.0 fL   MCH 33.2 26.0 - 34.0 pg   MCHC 34.5 30.0 - 36.0 g/dL   RDW 13.0 11.5 - 15.5 %   Platelets 252 150 - 400 K/uL  Rapid urine drug screen (hospital performed)  Result Value Ref Range   Opiates NONE DETECTED NONE DETECTED   Cocaine NONE DETECTED NONE DETECTED   Benzodiazepines NONE DETECTED NONE DETECTED   Amphetamines NONE DETECTED NONE DETECTED   Tetrahydrocannabinol NONE DETECTED NONE DETECTED   Barbiturates NONE DETECTED NONE DETECTED  CBG monitoring, ED  Result Value Ref Range   Glucose-Capillary 88 65 - 99 mg/dL   No results found.   Procedures Procedures (including critical care time)  Medications Ordered in ED Medications  acetaminophen (TYLENOL) tablet 650 mg (not administered)      Final Clinical  Impressions(s) / ED Diagnoses    Final diagnoses:  Drug overdose, undetermined intent, initial encounter    Medically cleared by me but will need to see psychiatry as patient has a history and 8 pills with ETOH is extremely concerning for intentional overdose    Abella Shugart, MD 03/13/17 [place], Enrico Eaddy, MD 03/13/17 [id number]

## 2017-03-13 NOTE — ED Triage Notes (Signed)
Per EMS: Pt from home.  Pt's daughter takes seroquel.  Daughter found seroquel bottle in her mother's bathroom.  There were only 2.5 tabs left when there should be ten.  Pt was asked if she took seroquel and pt verbalized she had taken seroquel with etoh.   Pt states that she doesn't remember taking a bunch of them.  Unsure if this was a suicidal attempt.  Pt responds to voice but is sleeping on arrival.

## 2017-03-13 NOTE — Discharge Instructions (Signed)
Tests showed no life-threatening condition.  Follow-up your primary care doctor.  Return if worse.

## 2017-03-13 NOTE — BH Assessment (Signed)
B[facility] Assessment Progress Note  Per Jacqueline Norman, DO, this pt does not require psychiatric hospitalization at this time.  [facility] from [facility].  Pt does not require any referrals for outpatient follow up.  Pt's nurse has been notified.  Avrom Robarts, MA Triage Specialist [phone]     

## 2017-03-13 NOTE — BHH Suicide Risk Assessment (Signed)
Suicide Risk Assessment  Discharge Assessment   [facility] Discharge Suicide Risk Assessment   Principal Problem: Adjustment disorder with mixed disturbance of emotions and conduct Discharge Diagnoses:  Patient Active Problem List   Diagnosis Date Noted  . Adjustment disorder with mixed disturbance of emotions and conduct [F43.25] 03/13/2017    Priority: High  . AKI (acute kidney injury) ([place]) [N17.9] 11/21/2016  . Stroke (cerebrum) ([place]) [I63.9] 11/21/2016  . Hyponatremia [E87.1] 11/20/2016  . UTI (urinary tract infection) [N39.0] 11/20/2016  . HLD (hyperlipidemia) [E78.5] 11/20/2016  . GERD (gastroesophageal reflux disease) [K21.9] 11/20/2016  . Depression [F32.9] 11/20/2016  . Hypertensive emergency [I16.1] 11/04/2016  . EtOH dependence ([place]) [F10.20] 11/04/2016  . Hyperglycemia [R73.9] 11/04/2016  . Essential hypertension [I10]   . Hypertensive urgency [I16.0] 10/30/2016  . PAF (paroxysmal atrial fibrillation) ([place]) [I48.0] 10/30/2016  . PRES (posterior reversible encephalopathy syndrome) [I67.83] 10/30/2016  . Seizure ([place]) [R56.9]   . Acute blood loss anemia [D62]   . Closed fracture of right hip ([place]) [S72.001A] 02/09/2016  . Chest pain [R07.9] 11/07/2015  . COPD (chronic obstructive pulmonary disease) ([place]) [J44.9] 08/19/2013  . COPD exacerbation ([place]) [J44.1] 08/19/2013  . Abnormal EKG [R94.31] 08/19/2013  . Fever, unspecified [R50.9] 08/19/2013    Total Time spent with patient: 45 minutes  Musculoskeletal: Strength & Muscle Tone: within normal limits Gait & Station: normal Patient leans: N/A  Psychiatric Specialty Exam:   Blood pressure (!) 174/80, pulse 68, temperature (!) 97.5 F (36.4 C), temperature source Oral, resp. rate 15, SpO2 98 %.There is no height or weight on file to calculate BMI.  General Appearance: Casual  Eye Contact::  Good  Speech:  Normal Rate409  Volume:  Normal  Mood:  Euthymic  Affect:  Congruent  Thought Process:  Coherent and  Descriptions of Associations: Intact  Orientation:  Full (Time, Place, and Person)  Thought Content:  WDL and Logical  Suicidal Thoughts:  No  Homicidal Thoughts:  No  Memory:  Immediate;   Good Recent;   Good Remote;   Good  Judgement:  Fair  Insight:  Good  Psychomotor Activity:  Normal  Concentration:  Good  Recall:  Good  Fund of Knowledge:Good  Language: Good  Akathisia:  No  Handed:  Right  AIMS (if indicated):     Assets:  Housing Leisure Time Physical Health Resilience Social Support  Sleep:     Cognition: WNL  ADL's:  Intact   Mental Status Per Nursing Assessment::   On Admission:   71 yo female who presented to the ED via her daughter after drinking and taking a Seroquel for her restless legs.  Denies suicide attempt or past attempts.  No homicidal ideations, hallucinations, and does not feel alcohol is an issue for her.  She lives in [place] but came to [place] to get her daughter into rehab for heroin use.  Caveat:  She fears her daughter sent her here to steal from her since she has multiple times in the past by breaking into her accounts and stealing things to sell, like her engagement ring from her deceased husband.  Stable to discharge.   Demographic Factors:  Caucasian and Living alone  Loss Factors: NA  Historical Factors: NA  Risk Reduction Factors:   Sense of responsibility to family  Continued Clinical Symptoms:  None   Cognitive Features That Contribute To Risk:  None    Suicide Risk:  Minimal: No identifiable suicidal ideation.  Patients presenting with no risk factors but with morbid  ruminations; may be classified as minimal risk based on the severity of the depressive symptoms    Plan Of Care/Follow-up recommendations:  Activity:  as tolerated Diet:  heart healthy diet  LORD, JAMISON, NP 03/13/2017, 10:49 AM

## 2017-03-13 NOTE — BH Assessment (Signed)
Assessment Note  Kim Blankenship is an 71 y.o. female who came to [facility] by EMS after her daughter called stating that she took 50 of her seroquel and alcohol. Pt adimately denies this and states that she took 1 seroquel for sleep and a glass of wine. (pt BAL on admission was 12). Pt states that she has been having a lot of stress due to her daughter's heroin addiction which has been affecting her sleep. She states that her daughter is living with her at the moment and since she has been there she has stolen around $500.00 from her. She states that her daughter has probably stolen 300,000 from her since she became an addict. Pt states that her daughter might have called EMS to get her out of the house to steal more from her. Pt is very tearful and states that she wants her daughter to go to rehab but she is "dragging her feet". Her daughter got a DUI recently where she was involved in a motor vehicle accident and may face jail time. She will most likely not be able to see her son and daughter anymore (pt's grandchildren) so this has been a stressor to her as well. Pt daughter also lost her fiance 3 weeks ago to complications with alcohol addiction.  Pt states that she is not suicidal and never was. She has no history of this and no previous history of inpatient admissions in the past. She has been to [facility] for stress related to her daughters addiction but no other outpatient counseling. She denies HI or AVH and has no history of this either. Pt has no access to guns in the home.  She states that she was confused with impaired memory when EMS got to her house. Pt states that she feels like "she did when she had TIA's in August". She states that she was hospitalized at [facility] in August for this. She currently has a neurological appointment December 26th. This was reported to the EDP who states that he will do a head CT scan to address this. Pt states that she has a sister in [place] that she plans to stay  with after her appointment on the 26th. She has good family support and no history of trauma or abuse in her past.   Pt does not meet inpatient criteria. Pt was staffed with Dr. Lacinda Axon- EDP who will take patient over medically to do a CT scan and work up.    Diagnosis: F43.21 Adjustment Disorder with depressed mood  Past Medical History:  Past Medical History:  Diagnosis Date  . Asthma   . Atrial fibrillation ([place])   . COPD (chronic obstructive pulmonary disease) ([place])   . Hypertension   . Irregular heart rate   . PRES (posterior reversible encephalopathy syndrome)   . Stroke [facility])     Past Surgical History:  Procedure Laterality Date  . CESAREAN SECTION    . Left Foot Reconstruction    . OPEN REDUCTION INTERNAL FIXATION (ORIF) METACARPAL Right 03/10/2015   Procedure: OPEN REDUCTION INTERNAL FIXATION (ORIF) RIGHT LONG  METACARPAL FRACTURE;  Surgeon: Leanora Cover, MD;  Location: [facility];  Service: Orthopedics;  Laterality: Right;  . TONSILLECTOMY    . TOTAL HIP ARTHROPLASTY Right 02/09/2016   Procedure: TOTAL HIP ARTHROPLASTY ANTERIOR APPROACH;  Surgeon: Frederik Pear, MD;  [facility];  Service: Orthopedics;  Laterality: Right;    Family History:  Family History  Problem Relation Age of Onset  . Dementia Father  Social History:  reports that she has quit smoking. Her smoking use included cigarettes. she has never used smokeless tobacco. She reports that she drinks alcohol. She reports that she does not use drugs.  Additional Social History:  Alcohol / Drug Use History of alcohol / drug use?: No history of alcohol / drug abuse  CIWA: CIWA-Ar BP: (!) 174/80 Pulse Rate: 68 COWS:    Allergies:  Allergies  Allergen Reactions  . Tape Other (See Comments)    THE PATIENT'S SKIN IS THIN AND TEARS VERY EASILY (she "picks" at it; please use coban wrap)    Home Medications:  (Not in a hospital admission)  OB/GYN Status:  No LMP recorded. Patient is  postmenopausal.  General Assessment Data [facility] of [facility]: [facility] TTS Assessment: In system Is this a Tele or Face-to-Face Assessment?: Face-to-Face Is this an Initial Assessment or a Re-assessment for this encounter?: Initial Assessment Marital status: Divorced [place] name: [place] Is patient pregnant?: No Pregnancy Status: No Living Arrangements: Children Can pt return to current living arrangement?: Yes Admission Status: Voluntary Is patient capable of signing voluntary admission?: No Referral Source: Self/Family/Friend Insurance type: Medicare      [place] Living Arrangements: Children Name of Psychiatrist: None Name of Therapist: None  Education Status Is patient currently in school?: No  Risk to self with the past 6 months Suicidal Ideation: No Has patient been a risk to self within the past 6 months prior to admission? : No Suicidal Intent: No Has patient had any suicidal intent within the past 6 months prior to admission? : No Is patient at risk for suicide?: No Suicidal Plan?: No Has patient had any suicidal plan within the past 6 months prior to admission? : No Access to Means: No What has been your use of drugs/alcohol within the last 12 months?: pt states she drinks a glass of wine Previous Attempts/Gestures: No How many times?: 0 Other Self Harm Risks: none Triggers for Past Attempts: None known Intentional Self Injurious Behavior: None Family Suicide History: No Recent stressful life event(s): Conflict (Comment) Persecutory voices/beliefs?: No Depression: Yes Depression Symptoms: Insomnia Substance abuse history and/or treatment for substance abuse?: No Suicide prevention information given to non-admitted patients: Not applicable  Risk to Others within the past 6 months Homicidal Ideation: No Does patient have any lifetime risk of violence toward others beyond the six months prior to admission? : No Thoughts of Harm to Others:  No Current Homicidal Intent: No Current Homicidal Plan: No Access to Homicidal Means: No Identified Victim: None History of harm to others?: No Assessment of Violence: None Noted Violent Behavior Description: none Does patient have access to weapons?: No Criminal Charges Pending?: No Does patient have a court date: No Is patient on probation?: No  Psychosis Hallucinations: None noted Delusions: None noted  Mental Status Report Appearance/Hygiene: In hospital gown Eye Contact: Good Motor Activity: Freedom of movement Speech: Logical/coherent Level of Consciousness: Alert Mood: Anxious Affect: Depressed Anxiety Level: Moderate Thought Processes: Coherent Judgement: Unimpaired Orientation: Person, Place, Time, Situation Obsessive Compulsive Thoughts/Behaviors: None  Cognitive Functioning Concentration: Normal Memory: Recent Intact, Remote Intact IQ: Average Insight: Good Impulse Control: Good Appetite: Good Weight Loss: 0 Weight Gain: 0 Sleep: Decreased Total Hours of Sleep: 4 Vegetative Symptoms: Staying in bed  ADLScreening [facility] Assessment Services) Patient's cognitive ability adequate to safely complete daily activities?: Yes Patient able to express need for assistance with ADLs?: Yes Independently performs ADLs?: Yes (appropriate for developmental age)  Prior Inpatient Therapy  Prior Inpatient Therapy: No  Prior Outpatient Therapy Prior Outpatient Therapy: No Does patient have an ACCT team?: No Does patient have Intensive In-House Services?  : No Does patient have [facility] services? : No Does patient have P4CC services?: No  ADL Screening (condition at time of admission) Patient's cognitive ability adequate to safely complete daily activities?: Yes Is the patient deaf or have difficulty hearing?: No Does the patient have difficulty seeing, even when wearing glasses/contacts?: No Does the patient have difficulty concentrating, remembering, or making  decisions?: No Patient able to express need for assistance with ADLs?: Yes Does the patient have difficulty dressing or bathing?: No Independently performs ADLs?: Yes (appropriate for developmental age) Does the patient have difficulty walking or climbing stairs?: No Weakness of Legs: None Weakness of Arms/Hands: None  Home Assistive Devices/Equipment Home Assistive Devices/Equipment: None  Therapy Consults (therapy consults require a physician order) PT Evaluation Needed: No OT Evalulation Needed: No SLP Evaluation Needed: No Abuse/Neglect Assessment (Assessment to be complete while patient is alone) Abuse/Neglect Assessment Can Be Completed: Yes Physical Abuse: Denies Verbal Abuse: Denies Sexual Abuse: Denies Self-Neglect: Denies Values / Beliefs Cultural Requests During Hospitalization: None Spiritual Requests During Hospitalization: None Consults Spiritual Care Consult Needed: No Social Work Consult Needed: No Regulatory affairs officer (For Healthcare) Does Patient Have a Medical Advance Directive?: No Would patient like information on creating a medical advance directive?: No - Patient declined Nutrition Screen- [place] Adult/[facility] Patient's home diet: Regular Has the patient recently lost weight without trying?: No Has the patient been eating poorly because of a decreased appetite?: No Malnutrition Screening Tool Score: 0  Additional Information 1:1 In Past 12 Months?: No CIRT Risk: No Elopement Risk: No Does patient have medical clearance?: Yes     Disposition:  Disposition Initial Assessment Completed for this Encounter: Yes Disposition of Patient: Other dispositions  On Site Evaluation by: Mindi Curling, LCAS  Reviewed with Physician:  Dr. Rudean Haskell [facility] 03/13/2017 9:55 AM

## 2017-08-14 ENCOUNTER — Encounter: Payer: Self-pay | Admitting: Physician Assistant

## 2017-09-02 ENCOUNTER — Ambulatory Visit: Payer: Medicare Other | Admitting: Physician Assistant

## 2018-01-05 ENCOUNTER — Institutional Professional Consult (permissible substitution): Payer: Medicare Other | Admitting: Pulmonary Disease

## 2018-04-04 ENCOUNTER — Emergency Department (HOSPITAL_COMMUNITY): Payer: Medicare Other

## 2018-04-04 ENCOUNTER — Encounter (HOSPITAL_COMMUNITY): Payer: Self-pay

## 2018-04-04 ENCOUNTER — Emergency Department (HOSPITAL_COMMUNITY)
Admission: EM | Admit: 2018-04-04 | Discharge: 2018-04-04 | Disposition: A | Discharge status: Home / Self Care | Payer: Medicare Other | Attending: Emergency Medicine | Admitting: Emergency Medicine

## 2018-04-04 ENCOUNTER — Other Ambulatory Visit: Payer: Self-pay

## 2018-04-04 DIAGNOSIS — Z87891 Personal history of nicotine dependence: Secondary | ICD-10-CM | POA: Diagnosis not present

## 2018-04-04 DIAGNOSIS — J441 Chronic obstructive pulmonary disease with (acute) exacerbation: Secondary | ICD-10-CM

## 2018-04-04 DIAGNOSIS — Z96641 Presence of right artificial hip joint: Secondary | ICD-10-CM | POA: Insufficient documentation

## 2018-04-04 DIAGNOSIS — Z7982 Long term (current) use of aspirin: Secondary | ICD-10-CM | POA: Insufficient documentation

## 2018-04-04 DIAGNOSIS — I10 Essential (primary) hypertension: Secondary | ICD-10-CM | POA: Insufficient documentation

## 2018-04-04 DIAGNOSIS — Z79899 Other long term (current) drug therapy: Secondary | ICD-10-CM | POA: Insufficient documentation

## 2018-04-04 DIAGNOSIS — R0602 Shortness of breath: Secondary | ICD-10-CM | POA: Diagnosis present

## 2018-04-04 LAB — CBC
HCT: 41.3 % (ref 36.0–46.0)
Hemoglobin: 13.4 g/dL (ref 12.0–15.0)
MCH: 31.9 pg (ref 26.0–34.0)
MCHC: 32.4 g/dL (ref 30.0–36.0)
MCV: 98.3 fL (ref 80.0–100.0)
Platelets: 240 10*3/uL (ref 150–400)
RBC: 4.2 MIL/uL (ref 3.87–5.11)
RDW: 13.2 % (ref 11.5–15.5)
WBC: 8.6 10*3/uL (ref 4.0–10.5)
nRBC: 0 % (ref 0.0–0.2)

## 2018-04-04 LAB — BASIC METABOLIC PANEL
Anion gap: 13 (ref 5–15)
BUN: 13 mg/dL (ref 8–23)
CO2: 26 mmol/L (ref 22–32)
Calcium: 9.3 mg/dL (ref 8.9–10.3)
Chloride: 103 mmol/L (ref 98–111)
Creatinine, Ser: 0.79 mg/dL (ref 0.44–1.00)
GFR calc Af Amer: >60 mL/min (ref >60)
GFR calc non Af Amer: >60 mL/min (ref >60)
Glucose, Bld: 100 mg/dL — ABNORMAL HIGH (ref 70–99)
Potassium: 3.8 mmol/L (ref 3.5–5.1)
Sodium: 142 mmol/L (ref 135–145)

## 2018-04-04 MED ORDER — ALBUTEROL SULFATE (2.5 MG/3ML) 0.083% IN NEBU
5.0000 mg | INHALATION_SOLUTION | Freq: Once | RESPIRATORY_TRACT | Status: AC
  Administered 2018-04-04: 5 mg via RESPIRATORY_TRACT
  Filled 2018-04-04: qty 6

## 2018-04-04 MED ORDER — PREDNISONE 50 MG PO TABS: 50.0000 mg | ORAL_TABLET | Freq: Every day | ORAL | 0 refills | Status: DC

## 2018-04-04 MED ORDER — PREDNISONE 20 MG PO TABS
60.0000 mg | ORAL_TABLET | Freq: Once | ORAL | Status: AC
  Administered 2018-04-04: 60 mg via ORAL
  Filled 2018-04-04: qty 3

## 2018-04-04 MED ORDER — ALBUTEROL SULFATE (5 MG/ML) 0.5% IN NEBU: 2.5000 mg | INHALATION_SOLUTION | Freq: Four times a day (QID) | RESPIRATORY_TRACT | 12 refills | Status: DC | PRN

## 2018-04-04 NOTE — ED Notes (Signed)
ED Provider at bedside. 

## 2018-04-04 NOTE — ED Provider Notes (Signed)
[facility] DEPT Provider Note   CSN: [id number] Arrival date & time: 04/04/18  1539     History   Chief Complaint Chief Complaint  Patient presents with  . Shortness of Breath    HPI Kim Blankenship is a 73 y.o. female.  HPI Patient presents to the emergency room for evaluation of shortness of breath.  Patient states she started having symptoms this past week.  Patient states she has a nebulizer machine but she does not have the appropriate fluid.  This past weekend the symptoms were getting more severe.  Last night she felt very short of breath and had to call EMS.  She was given a breathing treatment and felt significantly better.  Patient decided to go see a walk-in clinic affiliated with her doctor's office today to get a refill of her nebulizer solution.  Patient states she when she went there she was told she needed to come to the emergency room.  Patient denies any chest pain.  She denies any fevers.  She was given another breathing treatment here in the emergency room and is feeling better. Past Medical History:  Diagnosis Date  . Asthma   . Atrial fibrillation ([place])   . COPD (chronic obstructive pulmonary disease) ([place])   . Hypertension   . Irregular heart rate   . PRES (posterior reversible encephalopathy syndrome)   . Stroke [facility])     Patient Active Problem List   Diagnosis Date Noted  . 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 AKI (acute kidney injury) ([place]) 11/21/2016  . 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 Hyponatremia 11/20/2016  . UTI (urinary tract infection) 11/20/2016  . HLD (hyperlipidemia) 11/20/2016  . GERD (gastroesophageal reflux disease) 11/20/2016  . Depression 11/20/2016  . Hypertensive emergency 11/04/2016  . EtOH dependence ([place]) 11/04/2016  . Hyperglycemia 11/04/2016  . Essential hypertension   . Hypertensive urgency 10/30/2016  . PAF (paroxysmal atrial fibrillation)  ([place]) 10/30/2016  . PRES (posterior reversible encephalopathy syndrome) 10/30/2016  . Seizure ([place])   . Acute blood loss anemia   . Closed fracture of right hip ([place]) 02/09/2016  . Chest pain 11/07/2015  . COPD (chronic obstructive pulmonary disease) ([place]) 08/19/2013  . COPD exacerbation ([place]) 08/19/2013  . Abnormal EKG 08/19/2013  . Fever, unspecified 08/19/2013    Past Surgical History:  Procedure Laterality Date  . CESAREAN SECTION    . Left Foot Reconstruction    . OPEN REDUCTION INTERNAL FIXATION (ORIF) METACARPAL Right 03/10/2015   Procedure: OPEN REDUCTION INTERNAL FIXATION (ORIF) RIGHT LONG  METACARPAL FRACTURE;  Surgeon: Leanora Cover, MD;  Location: [facility];  Service: Orthopedics;  Laterality: Right;  . TONSILLECTOMY    . TOTAL HIP ARTHROPLASTY Right 02/09/2016   Procedure: TOTAL HIP ARTHROPLASTY ANTERIOR APPROACH;  Surgeon: Frederik Pear, MD;  Location: [facility];  Service: Orthopedics;  Laterality: Right;     OB History    Gravida      Para      Term      Preterm      AB      Living  1     SAB      TAB      Ectopic      Multiple      Live Births               Home Medications    Prior to Admission medications   Medication Sig Start Date End Date  Taking? Authorizing Provider  albuterol (PROVENTIL) (5 MG/ML) 0.5% nebulizer solution Take 0.5 mLs (2.5 mg total) by nebulization every 6 (six) hours as needed for wheezing or shortness of breath. 04/04/18   Dorie Rank, MD  aspirin EC 81 MG tablet Take 81 mg by mouth daily.    [provider]  atorvastatin (LIPITOR) 20 MG tablet Take 20 mg by mouth daily. 04/08/16   [provider]  citalopram (CELEXA) 10 MG tablet Take 10 mg by mouth daily.    [provider]  gabapentin (NEURONTIN) 300 MG capsule Take 300-600 mg by mouth 2 (two) times daily. 300MG  in the morning and 600MG  in the evening    [provider]  levETIRAcetam (KEPPRA) 500 MG tablet Take 1 tablet  (500 mg total) by mouth 2 (two) times daily. 11/06/16   Florencia Reasons, MD  metoprolol tartrate (LOPRESSOR) 25 MG tablet Take 1 tablet (25 mg total) by mouth 2 (two) times daily. Patient taking differently: Take 25-50 mg by mouth 2 (two) times daily. 50MG  in the morning and 25MG  in the evening 11/06/16   Florencia Reasons, MD  predniSONE (DELTASONE) 50 MG tablet Take 1 tablet (50 mg total) by mouth daily. 04/04/18   Dorie Rank, MD  spironolactone (ALDACTONE) 25 MG tablet Take 25 mg by mouth daily.    [provider]    Family History Family History  Problem Relation Age of Onset  . Dementia Father     Social History Social History   Tobacco Use  . Smoking status: Former Smoker    Types: Cigarettes  . Smokeless tobacco: Never Used  Substance Use Topics  . Alcohol use: Yes    Comment: wine daily  . Drug use: No     Allergies   Tape   Review of Systems Review of Systems  All other systems reviewed and are negative.    Physical Exam Updated Vital Signs BP (!) 171/143   Pulse 87   Temp 98.2 F (36.8 C) (Oral)   Resp (!) 30   Wt 66.2 kg   SpO2 94%   BMI 26.70 kg/m   Physical Exam Vitals signs and nursing note reviewed.  Constitutional:      General: She is not in acute distress.    Appearance: She is well-developed.  HENT:     Head: Normocephalic and atraumatic.     Right Ear: External ear normal.     Left Ear: External ear normal.  Eyes:     General: No scleral icterus.       Right eye: No discharge.        Left eye: No discharge.     Conjunctiva/sclera: Conjunctivae normal.  Neck:     Musculoskeletal: Neck supple.     Trachea: No tracheal deviation.  Cardiovascular:     Rate and Rhythm: Normal rate and regular rhythm.  Pulmonary:     Effort: Pulmonary effort is normal. No respiratory distress.     Breath sounds: No stridor. Wheezing present. No rales.  Abdominal:     General: Bowel sounds are normal. There is no distension.     Palpations: Abdomen is soft.      Tenderness: There is no abdominal tenderness. There is no guarding or rebound.  Musculoskeletal:        General: No tenderness.  Skin:    General: Skin is warm and dry.     Findings: No rash.  Neurological:     Mental Status: She is alert.  Cranial Nerves: No cranial nerve deficit (no facial droop, extraocular movements intact, no slurred speech).     Sensory: No sensory deficit.     Motor: No abnormal muscle tone or seizure activity.     Coordination: Coordination normal.      ED Treatments / Results  Labs (all labs ordered are listed, but only abnormal results are displayed) Labs Reviewed  BASIC METABOLIC PANEL - Abnormal; Notable for the following components:      Result Value   Glucose, Bld 100 (*)    All other components within normal limits  CBC    EKG EKG Interpretation  Date/Time:  Saturday April 04 2018 16:33:03 EST Ventricular Rate:  77 PR Interval:    QRS Duration: 108 QT Interval:  418 QTC Calculation: 474 R Axis:   81 Text Interpretation:  poor data quality normal sinus rhythm borderline right axis deviation Confirmed by Dorie Rank [phone]) on 04/04/2018 4:36:09 PM   Radiology Dg Chest 2 View  Result Date: 04/04/2018 CLINICAL DATA:  Shortness of breath reported history of COPD. EXAM: CHEST - 2 VIEW COMPARISON:  10/30/2016 FINDINGS: Cardiac silhouette is normal in size and configuration no mediastinal or hilar masses. No evidence of adenopathy. Lungs are mildly hyperexpanded, but clear. No pleural effusion or pneumothorax. Skeletal structures are intact. IMPRESSION: No active cardiopulmonary disease. Electronically Signed   By: Lajean Manes M.D.   On: 04/04/2018 18:22    Procedures Procedures (including critical care time)  Medications Ordered in ED Medications  albuterol (PROVENTIL) (2.5 MG/3ML) 0.083% nebulizer solution 5 mg (5 mg Nebulization Given 04/04/18 1626)  albuterol (PROVENTIL) (2.5 MG/3ML) 0.083% nebulizer solution 5 mg (5 mg Nebulization  Given 04/04/18 1812)  predniSONE (DELTASONE) tablet 60 mg (60 mg Oral Given 04/04/18 1812)     Initial Impression / Assessment and Plan / ED Course  I have reviewed the triage vital signs and the nursing notes.  Pertinent labs & imaging results that were available during my care of the patient were reviewed by me and considered in my medical decision making (see chart for details).  Clinical Course as of Apr 05 1919  Sat Apr 04, 2018  1857 Chest x-ray without pneumonia.  Labs reviewed.  No acute abnormalities   [JK]  1920 Symptoms improved after nebulizer treatment.  Patient is breathing easily.  Her oxygen saturation is normal.  Please note that the blood pressure and respiratory rate documented at 1900 is an accurate at the bedside her blood pressure was in the 140s and she was breathing in the low 20s   [JK]    Clinical Course User Index [JK] Dorie Rank, MD    She presented to the emergency room for evaluation of shortness of breath.  Patient has a history of COPD.  In the ED she was wheezing.  Symptoms improved with breathing treatments.  Labs and x-rays are reassuring.  Plan on discharge home with prescription of albuterol and prednisone.  Final Clinical Impressions(s) / ED Diagnoses   Final diagnoses:  COPD exacerbation [facility])    ED Discharge Orders         Ordered    albuterol (PROVENTIL) (5 MG/ML) 0.5% nebulizer solution  Every 6 hours PRN     04/04/18 1920    predniSONE (DELTASONE) 50 MG tablet  Daily     04/04/18 1920           Dorie Rank, MD 04/04/18 [place]

## 2018-04-04 NOTE — ED Notes (Signed)
Bed: WA12 Expected date:  Expected time:  Means of arrival:  Comments: Triage 1  

## 2018-04-04 NOTE — Discharge Instructions (Addendum)
Take the medications as prescribed, follow-up with your primary care doctor next week to make sure you are improving °

## 2018-04-04 NOTE — ED Triage Notes (Signed)
Pt reports that she called EMS last night 3 times due to SOB. Pt reports that they recently prescribed nebs . Pt reports that she does not have neb solution at home and went to a walk in clinic to get a neb prescription and the walk in clinic denied the prescription and told her to come to ER. Pt reports SOB worsening Tuesday.

## 2018-06-01 ENCOUNTER — Ambulatory Visit (INDEPENDENT_AMBULATORY_CARE_PROVIDER_SITE_OTHER): Payer: Medicare Other | Admitting: Emergency Medicine

## 2018-06-01 ENCOUNTER — Telehealth: Payer: Self-pay | Admitting: Emergency Medicine

## 2018-06-01 ENCOUNTER — Encounter: Payer: Self-pay | Admitting: Emergency Medicine

## 2018-06-01 VITALS — BP 134/80 | HR 71 | Ht 63.0 in | Wt 163.0 lb

## 2018-06-01 DIAGNOSIS — J449 Chronic obstructive pulmonary disease, unspecified: Secondary | ICD-10-CM

## 2018-06-01 NOTE — Progress Notes (Signed)
Subjective:    Patient ID: Kim Blankenship, female    DOB: 08/20/1945, 73 y.o.   MRN: [id number]  HPI 73 year old former smoker (60 pack years) with a history of atrial fibrillation, hypertension, history of PRES syndrome w associated seizure activity.  Also carries a history of COPD. She was started on Symbicort about 1.5 yrs ago, but is not on it currently. She has SOB w stairs, cold weather. Able to shop, do most household chores (but slower than she used to). She has a lot of nasal drainage, coughs, prod of white. Can be brought on w exertion. She hears wheeze almost every day. She uses DuoNeb nebs twice a day. Uses ventolin about 4-5x a day. She has exacerbated before > was in ED in January 2020.    Review of Systems  Constitutional: Positive for unexpected weight change. Negative for fever.  HENT: Negative for congestion, dental problem, ear pain, nosebleeds, postnasal drip, rhinorrhea, sinus pressure, sneezing, sore throat and trouble swallowing.   Eyes: Negative for redness and itching.  Respiratory: Positive for cough and shortness of breath. Negative for chest tightness and wheezing.   Cardiovascular: Negative for palpitations and leg swelling.  Gastrointestinal: Negative for nausea and vomiting.  Genitourinary: Negative for dysuria.  Musculoskeletal: Negative for joint swelling.  Skin: Negative for rash.  Neurological: Negative for headaches.  Hematological: Does not bruise/bleed easily.  Psychiatric/Behavioral: Positive for dysphoric mood. The patient is not nervous/anxious.     Past Medical History:  Diagnosis Date  . Asthma   . Atrial fibrillation ([place])   . COPD (chronic obstructive pulmonary disease) ([place])   . Hypertension   . Irregular heart rate   . PRES (posterior reversible encephalopathy syndrome)   . Stroke [facility])      Family History  Problem Relation Age of Onset  . Dementia Father      Social History   Socioeconomic History  . Marital status: Divorced      Spouse name: Not on file  . Number of children: 1  . Years of education: Not on file  . Highest education level: Not on file  Occupational History  . Not on file  Social Needs  . Financial resource strain: Not on file  . Food insecurity:    Worry: Not on file    Inability: Not on file  . Transportation needs:    Medical: Not on file    Non-medical: Not on file  Tobacco Use  . Smoking status: Former Smoker    Packs/day: 2.00    Years: 30.00    Pack years: 60.00    Types: Cigarettes    Last attempt to quit: 04/1995    Years since quitting: 23.1  . Smokeless tobacco: Never Used  Substance and Sexual Activity  . Alcohol use: Yes    Comment: wine daily  . Drug use: No  . Sexual activity: Not on file  Lifestyle  . Physical activity:    Days per week: Not on file    Minutes per session: Not on file  . Stress: Not on file  Relationships  . Social connections:    Talks on phone: Not on file    Gets together: Not on file    Attends religious service: Not on file    Active member of club or organization: Not on file    Attends meetings of clubs or organizations: Not on file    Relationship status: Not on file  . Intimate partner violence:    Fear  of current or ex partner: Not on file    Emotionally abused: Not on file    Physically abused: Not on file    Forced sexual activity: Not on file  Other Topics Concern  . Not on file  Social History Narrative   Lives with daughter and her son   Caffeine use: 1 cup coffee every morning     Allergies  Allergen Reactions  . Tape Other (See Comments)    THE PATIENT'S SKIN IS THIN AND TEARS VERY EASILY (she "picks" at it; please use coban wrap)     Outpatient Medications Prior to Visit  Medication Sig Dispense Refill  . albuterol (PROVENTIL HFA;VENTOLIN HFA) 108 (90 Base) MCG/ACT inhaler Inhale 2 puffs into the lungs every 6 (six) hours as needed for wheezing or shortness of breath.    Marland Kitchen aspirin EC 81 MG tablet Take 81 mg by  mouth daily.    Marland Kitchen atorvastatin (LIPITOR) 20 MG tablet Take 20 mg by mouth daily.    . budesonide-formoterol (SYMBICORT) 160-4.5 MCG/ACT inhaler Inhale 2 puffs into the lungs 2 (two) times daily.    . citalopram (CELEXA) 10 MG tablet Take 10 mg by mouth daily.    Marland Kitchen ipratropium-albuterol (DUONEB) 0.5-2.5 (3) MG/3ML SOLN Take 3 mLs by nebulization every 6 (six) hours as needed.    . levETIRAcetam (KEPPRA) 500 MG tablet Take 1 tablet (500 mg total) by mouth 2 (two) times daily. 60 tablet 0  . metoprolol tartrate (LOPRESSOR) 25 MG tablet Take 1 tablet (25 mg total) by mouth 2 (two) times daily. (Patient taking differently: Take 25-50 mg by mouth 2 (two) times daily. 50MG  in the morning and 25MG  in the evening) 60 tablet 0  . Multiple Vitamins-Minerals (MULTIVITAMIN ADULT PO) Take 1 tablet by mouth daily.    Marland Kitchen spironolactone (ALDACTONE) 25 MG tablet Take 25 mg by mouth daily.    Marland Kitchen albuterol (PROVENTIL) (5 MG/ML) 0.5% nebulizer solution Take 0.5 mLs (2.5 mg total) by nebulization every 6 (six) hours as needed for wheezing or shortness of breath. 20 mL 12  . gabapentin (NEURONTIN) 300 MG capsule Take 300-600 mg by mouth 2 (two) times daily. 300MG  in the morning and 600MG  in the evening    . predniSONE (DELTASONE) 50 MG tablet Take 1 tablet (50 mg total) by mouth daily. 5 tablet 0   No facility-administered medications prior to visit.         Objective:   Physical Exam Vitals:   06/01/18 1121  BP: 134/80  Pulse: 71  SpO2: 94%  Weight: 163 lb (73.9 kg)  Height: 5\' 3"  (1.6 m)  Gen: Pleasant, well-nourished, in no distress,  normal affect  ENT: No lesions,  mouth clear,  oropharynx clear, no postnasal drip  Neck: No JVD, no stridor  Lungs: No use of accessory muscles, no crackles or wheezing on normal respiration, no wheeze on forced expiration  Cardiovascular: RRR, heart sounds normal, no murmur or gallops, no peripheral edema  Musculoskeletal: No deformities, no cyanosis or  clubbing  Neuro: alert, awake, non focal  Skin: Warm, no lesions or rash      Assessment & Plan:  COPD (chronic obstructive pulmonary disease) (HCC) Based on her clinical history, tobacco history, response to prednisone and bronchodilators suspect that she does have at least moderate COPD.  She needs pulmonary function testing to quantify her degree of obstruction.  I'd like to have her on scheduled LAMA / LABA therapy.  She is not sure that she has  gotten much response to DuoNeb.  I will have her get a copy of her insurance formulary so that we will know which medications are preferred.  In the meantime increase the DuoNeb to 3 times daily, continue the albuterol as needed.  We will perform full pulmonary function testing at your next office visit. Please continue your albuterol/ipratropium (DuoNeb) nebulizer treatments on a daily schedule.  You could consider increasing this from 2 times a day to 3 times a day until next visit. Keep your albuterol inhaler (Ventolin) available to use 2 puffs up to every 4 hours if you need it for shortness of breath, wheeze, chest tightness Please try to obtain a copy of your insurance formulary for [place] to review next time.  This will help [place] choose the most appropriate and cost effective inhaler. Pneumonia shot up-to-date, given 2017 Follow with Dr. Lamonte Sakai next available with full pulmonary function testing on the same day.    Baltazar Apo, MD, PhD 06/01/2018, 12:01 PM [facility] [phone] or if no answer [phone]

## 2018-06-01 NOTE — Patient Instructions (Addendum)
We will perform full pulmonary function testing at your next office visit. Please continue your albuterol/ipratropium (DuoNeb) nebulizer treatments on a daily schedule.  You could consider increasing this from 2 times a day to 3 times a day until next visit. Keep your albuterol inhaler (Ventolin) available to use 2 puffs up to every 4 hours if you need it for shortness of breath, wheeze, chest tightness Please try to obtain a copy of your insurance formulary for [place] to review next time.  This will help [place] choose the most appropriate and cost effective inhaler. Pneumonia shot up-to-date, given 2017 Follow with Dr. Lamonte Sakai next available with full pulmonary function testing on the same day.

## 2018-06-01 NOTE — Assessment & Plan Note (Signed)
Based on her clinical history, tobacco history, response to prednisone and bronchodilators suspect that she does have at least moderate COPD.  She needs pulmonary function testing to quantify her degree of obstruction.  I'd like to have her on scheduled LAMA / LABA therapy.  She is not sure that she has gotten much response to DuoNeb.  I will have her get a copy of her insurance formulary so that we will know which medications are preferred.  In the meantime increase the DuoNeb to 3 times daily, continue the albuterol as needed.  We will perform full pulmonary function testing at your next office visit. Please continue your albuterol/ipratropium (DuoNeb) nebulizer treatments on a daily schedule.  You could consider increasing this from 2 times a day to 3 times a day until next visit. Keep your albuterol inhaler (Ventolin) available to use 2 puffs up to every 4 hours if you need it for shortness of breath, wheeze, chest tightness Please try to obtain a copy of your insurance formulary for [place] to review next time.  This will help [place] choose the most appropriate and cost effective inhaler. Pneumonia shot up-to-date, given 2017 Follow with Dr. Lamonte Sakai next available with full pulmonary function testing on the same day.

## 2018-06-01 NOTE — Telephone Encounter (Signed)
Office note from today's visit with Dr. Lamonte Sakai has been printed. Faxed to Angela's attention at [phone].

## 2018-06-30 ENCOUNTER — Ambulatory Visit: Payer: Medicare Other | Admitting: Emergency Medicine

## 2018-07-29 IMAGING — RF DG HIP (WITH PELVIS) OPERATIVE*R*
1 series · 2 of 2 positions shown · non-contrast
Comparison: 02/09/2016

CLINICAL DATA: Postop

EXAM:
DG C-ARM 61-120 MIN-NO REPORT; OPERATIVE RIGHT HIP WITH PELVIS

[Series 1: run · 2 of 2 slices shown]
[im 1/2]
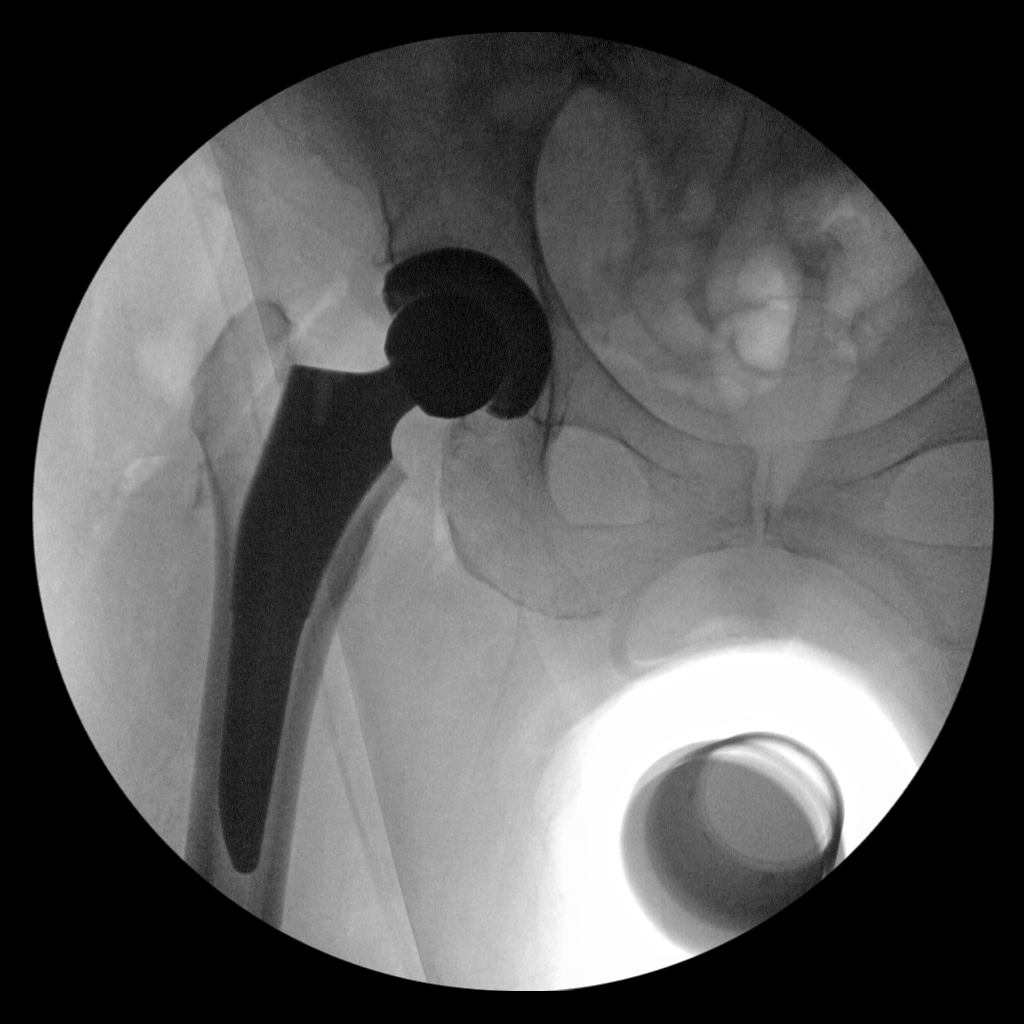
[im 2/2]
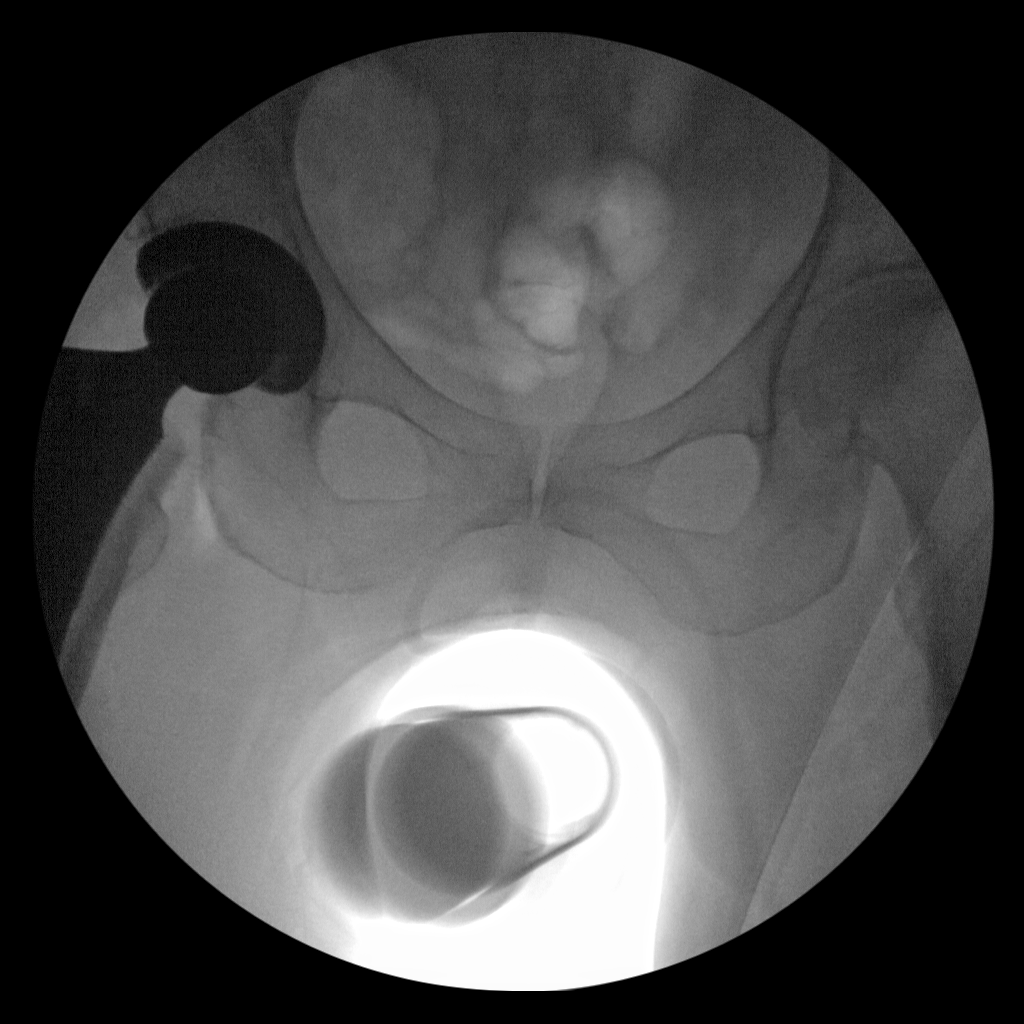

[2 of 2 positions shown; findings below may reference images not displayed]

FLUOROSCOPY TIME:  Fluoroscopy Time:  15 seconds

Radiation Exposure Index (if provided by the fluoroscopic device):
0.58 mGy

Number of Acquired Spot Images: 2
FINDINGS: The patient is status post intraoperative repair of right femoral
neck fracture. There is right hip prosthesis with anatomic
alignment.
IMPRESSION: Right hip prosthesis with anatomic alignment. Please see the
operative report.

## 2018-12-04 ENCOUNTER — Other Ambulatory Visit: Payer: Self-pay

## 2018-12-04 ENCOUNTER — Emergency Department (HOSPITAL_COMMUNITY): Payer: Medicare Other

## 2018-12-04 ENCOUNTER — Inpatient Hospital Stay (HOSPITAL_COMMUNITY)
Admission: EM | Admit: 2018-12-04 | Discharge: 2018-12-10 | DRG: 378 | Disposition: A | Discharge status: Home / Self Care | Payer: Medicare Other | Attending: Internal Medicine | Admitting: Internal Medicine

## 2018-12-04 ENCOUNTER — Encounter (HOSPITAL_COMMUNITY): Payer: Self-pay

## 2018-12-04 DIAGNOSIS — K921 Melena: Secondary | ICD-10-CM

## 2018-12-04 DIAGNOSIS — K922 Gastrointestinal hemorrhage, unspecified: Secondary | ICD-10-CM | POA: Diagnosis not present

## 2018-12-04 DIAGNOSIS — K920 Hematemesis: Secondary | ICD-10-CM

## 2018-12-04 DIAGNOSIS — N179 Acute kidney failure, unspecified: Secondary | ICD-10-CM | POA: Diagnosis present

## 2018-12-04 DIAGNOSIS — Z20828 Contact with and (suspected) exposure to other viral communicable diseases: Secondary | ICD-10-CM | POA: Diagnosis present

## 2018-12-04 DIAGNOSIS — I480 Paroxysmal atrial fibrillation: Secondary | ICD-10-CM | POA: Diagnosis present

## 2018-12-04 DIAGNOSIS — Z87891 Personal history of nicotine dependence: Secondary | ICD-10-CM

## 2018-12-04 DIAGNOSIS — I10 Essential (primary) hypertension: Secondary | ICD-10-CM | POA: Diagnosis present

## 2018-12-04 DIAGNOSIS — Z96641 Presence of right artificial hip joint: Secondary | ICD-10-CM | POA: Diagnosis present

## 2018-12-04 DIAGNOSIS — D62 Acute posthemorrhagic anemia: Secondary | ICD-10-CM | POA: Diagnosis present

## 2018-12-04 DIAGNOSIS — F329 Major depressive disorder, single episode, unspecified: Secondary | ICD-10-CM | POA: Diagnosis present

## 2018-12-04 DIAGNOSIS — F32A Depression, unspecified: Secondary | ICD-10-CM | POA: Diagnosis present

## 2018-12-04 DIAGNOSIS — D509 Iron deficiency anemia, unspecified: Secondary | ICD-10-CM | POA: Diagnosis present

## 2018-12-04 DIAGNOSIS — I639 Cerebral infarction, unspecified: Secondary | ICD-10-CM | POA: Diagnosis present

## 2018-12-04 DIAGNOSIS — Z8673 Personal history of transient ischemic attack (TIA), and cerebral infarction without residual deficits: Secondary | ICD-10-CM

## 2018-12-04 DIAGNOSIS — I110 Hypertensive heart disease with heart failure: Secondary | ICD-10-CM | POA: Diagnosis present

## 2018-12-04 DIAGNOSIS — K219 Gastro-esophageal reflux disease without esophagitis: Secondary | ICD-10-CM | POA: Diagnosis present

## 2018-12-04 DIAGNOSIS — T39395A Adverse effect of other nonsteroidal anti-inflammatory drugs [NSAID], initial encounter: Secondary | ICD-10-CM | POA: Diagnosis present

## 2018-12-04 DIAGNOSIS — R319 Hematuria, unspecified: Secondary | ICD-10-CM | POA: Diagnosis not present

## 2018-12-04 DIAGNOSIS — Z791 Long term (current) use of non-steroidal anti-inflammatories (NSAID): Secondary | ICD-10-CM

## 2018-12-04 DIAGNOSIS — N3001 Acute cystitis with hematuria: Secondary | ICD-10-CM | POA: Diagnosis present

## 2018-12-04 DIAGNOSIS — N390 Urinary tract infection, site not specified: Secondary | ICD-10-CM | POA: Diagnosis not present

## 2018-12-04 DIAGNOSIS — E785 Hyperlipidemia, unspecified: Secondary | ICD-10-CM | POA: Diagnosis present

## 2018-12-04 DIAGNOSIS — R451 Restlessness and agitation: Secondary | ICD-10-CM | POA: Diagnosis not present

## 2018-12-04 DIAGNOSIS — Z7951 Long term (current) use of inhaled steroids: Secondary | ICD-10-CM | POA: Diagnosis not present

## 2018-12-04 DIAGNOSIS — J449 Chronic obstructive pulmonary disease, unspecified: Secondary | ICD-10-CM | POA: Diagnosis present

## 2018-12-04 DIAGNOSIS — Z79899 Other long term (current) drug therapy: Secondary | ICD-10-CM | POA: Diagnosis not present

## 2018-12-04 DIAGNOSIS — Z781 Physical restraint status: Secondary | ICD-10-CM

## 2018-12-04 DIAGNOSIS — R569 Unspecified convulsions: Secondary | ICD-10-CM | POA: Diagnosis not present

## 2018-12-04 DIAGNOSIS — F600 Paranoid personality disorder: Secondary | ICD-10-CM | POA: Diagnosis not present

## 2018-12-04 DIAGNOSIS — F1020 Alcohol dependence, uncomplicated: Secondary | ICD-10-CM | POA: Diagnosis present

## 2018-12-04 DIAGNOSIS — E876 Hypokalemia: Secondary | ICD-10-CM | POA: Diagnosis not present

## 2018-12-04 DIAGNOSIS — I5032 Chronic diastolic (congestive) heart failure: Secondary | ICD-10-CM | POA: Diagnosis present

## 2018-12-04 DIAGNOSIS — Z7982 Long term (current) use of aspirin: Secondary | ICD-10-CM

## 2018-12-04 DIAGNOSIS — R410 Disorientation, unspecified: Secondary | ICD-10-CM

## 2018-12-04 DIAGNOSIS — G2581 Restless legs syndrome: Secondary | ICD-10-CM | POA: Diagnosis present

## 2018-12-04 DIAGNOSIS — Z23 Encounter for immunization: Secondary | ICD-10-CM

## 2018-12-04 DIAGNOSIS — F10239 Alcohol dependence with withdrawal, unspecified: Secondary | ICD-10-CM | POA: Diagnosis not present

## 2018-12-04 DIAGNOSIS — F05 Delirium due to known physiological condition: Secondary | ICD-10-CM | POA: Diagnosis not present

## 2018-12-04 DIAGNOSIS — G40909 Epilepsy, unspecified, not intractable, without status epilepticus: Secondary | ICD-10-CM | POA: Diagnosis present

## 2018-12-04 DIAGNOSIS — D500 Iron deficiency anemia secondary to blood loss (chronic): Secondary | ICD-10-CM | POA: Diagnosis present

## 2018-12-04 LAB — COMPREHENSIVE METABOLIC PANEL
ALT: 23 U/L (ref 0–44)
AST: 24 U/L (ref 15–41)
Albumin: 3.7 g/dL (ref 3.5–5.0)
Alkaline Phosphatase: 56 U/L (ref 38–126)
Anion gap: 13 (ref 5–15)
BUN: 39 mg/dL — ABNORMAL HIGH (ref 8–23)
CO2: 21 mmol/L — ABNORMAL LOW (ref 22–32)
Calcium: 9 mg/dL (ref 8.9–10.3)
Chloride: 102 mmol/L (ref 98–111)
Creatinine, Ser: 1.66 mg/dL — ABNORMAL HIGH (ref 0.44–1.00)
GFR calc Af Amer: 36 mL/min — ABNORMAL LOW (ref >60)
GFR calc non Af Amer: 31 mL/min — ABNORMAL LOW (ref >60)
Glucose, Bld: 119 mg/dL — ABNORMAL HIGH (ref 70–99)
Potassium: 3.8 mmol/L (ref 3.5–5.1)
Sodium: 136 mmol/L (ref 135–145)
Total Bilirubin: 0.4 mg/dL (ref 0.3–1.2)
Total Protein: 6 g/dL — ABNORMAL LOW (ref 6.5–8.1)

## 2018-12-04 LAB — POC OCCULT BLOOD, ED: Fecal Occult Bld: POSITIVE — AB

## 2018-12-04 LAB — TYPE AND SCREEN
ABO/RH(D): O NEG
Antibody Screen: NEGATIVE

## 2018-12-04 LAB — PROTIME-INR
INR: 1 (ref 0.8–1.2)
Prothrombin Time: 13.4 seconds (ref 11.4–15.2)

## 2018-12-04 LAB — LACTIC ACID, PLASMA: Lactic Acid, Venous: 1.5 mmol/L (ref 0.5–1.9)

## 2018-12-04 LAB — URINALYSIS, ROUTINE W REFLEX MICROSCOPIC
Bilirubin Urine: NEGATIVE
Glucose, UA: NEGATIVE mg/dL
Ketones, ur: NEGATIVE mg/dL
Nitrite: NEGATIVE
Protein, ur: NEGATIVE mg/dL
Specific Gravity, Urine: 1.021 (ref 1.005–1.030)
pH: 5 (ref 5.0–8.0)

## 2018-12-04 LAB — CBC
HCT: 29.9 % — ABNORMAL LOW (ref 36.0–46.0)
Hemoglobin: 10.2 g/dL — ABNORMAL LOW (ref 12.0–15.0)
MCH: 32.1 pg (ref 26.0–34.0)
MCHC: 34.1 g/dL (ref 30.0–36.0)
MCV: 94 fL (ref 80.0–100.0)
Platelets: 307 10*3/uL (ref 150–400)
RBC: 3.18 MIL/uL — ABNORMAL LOW (ref 3.87–5.11)
RDW: 12.7 % (ref 11.5–15.5)
WBC: 8.8 10*3/uL (ref 4.0–10.5)
nRBC: 0 % (ref 0.0–0.2)

## 2018-12-04 LAB — ABO/RH: ABO/RH(D): O NEG

## 2018-12-04 MED ORDER — SODIUM CHLORIDE 0.9 % IV SOLN
1.0000 g | Freq: Once | INTRAVENOUS | Status: AC
  Administered 2018-12-04: 1 g via INTRAVENOUS
  Filled 2018-12-04: qty 10

## 2018-12-04 MED ORDER — LORAZEPAM 2 MG/ML IJ SOLN: 1.0000 mg | INTRAMUSCULAR | Status: DC | PRN

## 2018-12-04 MED ORDER — SODIUM CHLORIDE 0.9 % IV BOLUS
500.0000 mL | Freq: Once | INTRAVENOUS | Status: AC
  Administered 2018-12-04: 500 mL via INTRAVENOUS

## 2018-12-04 MED ORDER — PANTOPRAZOLE SODIUM 40 MG IV SOLR
40.0000 mg | Freq: Once | INTRAVENOUS | Status: AC
  Administered 2018-12-04: 40 mg via INTRAVENOUS
  Filled 2018-12-04: qty 40

## 2018-12-04 MED ORDER — PANTOPRAZOLE SODIUM 40 MG IV SOLR
40.0000 mg | Freq: Two times a day (BID) | INTRAVENOUS | Status: DC
  Administered 2018-12-04: 40 mg via INTRAVENOUS
  Filled 2018-12-04: qty 40

## 2018-12-04 MED ORDER — IOHEXOL 300 MG/ML  SOLN
80.0000 mL | Freq: Once | INTRAMUSCULAR | Status: AC | PRN
  Administered 2018-12-04: 80 mL via INTRAVENOUS

## 2018-12-04 NOTE — H&P (Signed)
History and Physical    Kim Blankenship [id number] DOB: Mar 16, 1946 DOA: 12/04/2018  Referring MD/NP/PA:   PCP: Gregor Hams, FNP   Patient coming from:  The patient is coming from home.  At baseline, pt is independent for most of ADL.        Chief Complaint: Hematemesis, dysuria  HPI: Kim Blankenship is a 73 y.o. female with medical history significant of hypertension, hyperlipidemia, COPD, stroke, depression, PRES, atrial fibrillation not on anticoagulants, asthma, dCHF, alcohol abuse, seizure, who presents with hematemesis.  Patient states that she has had 3 episodes of hematemesis, 2 episodes at 2 days ago and another episode this morning.  She also had multiple episodes of dark stool diarrhea since yesterday.  Patient denies dizziness, chest pain or shortness of breath.  She states that she has suprapubic abdominal discomfort, dysuria, burning on urination and increased urinary frequency.  No epigastric abdominal pain.  No unilateral weakness.  Patient states she is taking aspirin and ibuprofen.  She denies history of GI bleeding or gastric ulcer disease.  She is alcohol dependent, last drinking was 2 days ago.  ED Course: pt was found to have hemoglobin dropped from 13.4 on 04/04/2018 10.2 today, positive FOBT, positive urinalysis (hazy appearance, large amount of leukocyte, rare bacteria, WBC 21-50), negative COVID-19 test, AKI with creatinine 1.66, BUN 39, temperature normal, blood pressure 159/87, heart rate in 90s, oxygen saturation 91 to 93% on room air.  CT abdomen/pelvis is not impressive.  Patient is admitted to telemetry bed as inpatient. Dr. Cristina Gong of GI was consulted  Review of Systems:   General: no fevers, chills, no body weight gain, has fatigue HEENT: no blurry vision, hearing changes or sore throat Respiratory: no dyspnea, coughing, wheezing CV: no chest pain, no palpitations GI: has nausea, hematemesis, has suprapubic abdominal pain. no constipation GU: has  dysuria, burning on urination, increased urinary frequency, no hematuria  Ext: no leg edema Neuro: no unilateral weakness, numbness, or tingling, no vision change or hearing loss Skin: no rash, no skin tear. MSK: No muscle spasm, no deformity, no limitation of range of movement in spin Heme: No easy bruising.  Travel history: No recent long distant travel.  Allergy:  Allergies  Allergen Reactions   Tape Other (See Comments)    THE PATIENT'S SKIN IS THIN AND TEARS VERY EASILY (she "picks" at it; please use coban wrap)    Past Medical History:  Diagnosis Date   Asthma    Atrial fibrillation (HCC)    COPD (chronic obstructive pulmonary disease) ([place])    Hypertension    Irregular heart rate    PRES (posterior reversible encephalopathy syndrome)    Stroke [facility])     Past Surgical History:  Procedure Laterality Date   CESAREAN SECTION     Left Foot Reconstruction     OPEN REDUCTION INTERNAL FIXATION (ORIF) METACARPAL Right 03/10/2015   Procedure: OPEN REDUCTION INTERNAL FIXATION (ORIF) RIGHT LONG  METACARPAL FRACTURE;  Surgeon: Leanora Cover, MD;  Location: [place];  Service: Orthopedics;  Laterality: Right;   TONSILLECTOMY     TOTAL HIP ARTHROPLASTY Right 02/09/2016   Procedure: TOTAL HIP ARTHROPLASTY ANTERIOR APPROACH;  Surgeon: Frederik Pear, MD;  Location: [facility];  Service: Orthopedics;  Laterality: Right;    Social History:  reports that she quit smoking about 23 years ago. Her smoking use included cigarettes. She has a 60.00 pack-year smoking history. She has never used smokeless tobacco. She reports current alcohol use. She reports that she  does not use drugs.  Family History:  Family History  Problem Relation Age of Onset   Dementia Father      Prior to Admission medications   Medication Sig Start Date End Date Taking? Authorizing Provider  albuterol (PROVENTIL HFA;VENTOLIN HFA) 108 (90 Base) MCG/ACT inhaler Inhale 2 puffs into the lungs  every 6 (six) hours as needed for wheezing or shortness of breath.    [provider]  aspirin EC 81 MG tablet Take 81 mg by mouth daily.    [provider]  atorvastatin (LIPITOR) 20 MG tablet Take 20 mg by mouth daily. 04/08/16   [provider]  budesonide-formoterol (SYMBICORT) 160-4.5 MCG/ACT inhaler Inhale 2 puffs into the lungs 2 (two) times daily. 05/27/18   [provider]  citalopram (CELEXA) 10 MG tablet Take 10 mg by mouth daily.    [provider]  ipratropium-albuterol (DUONEB) 0.5-2.5 (3) MG/3ML SOLN Take 3 mLs by nebulization every 6 (six) hours as needed.    [provider]  levETIRAcetam (KEPPRA) 500 MG tablet Take 1 tablet (500 mg total) by mouth 2 (two) times daily. 11/06/16   Florencia Reasons, MD  metoprolol tartrate (LOPRESSOR) 25 MG tablet Take 1 tablet (25 mg total) by mouth 2 (two) times daily. Patient taking differently: Take 25-50 mg by mouth 2 (two) times daily. 50MG  in the morning and 25MG  in the evening 11/06/16   Florencia Reasons, MD  Multiple Vitamins-Minerals (MULTIVITAMIN ADULT PO) Take 1 tablet by mouth daily.    [provider]  spironolactone (ALDACTONE) 25 MG tablet Take 25 mg by mouth daily.    [provider]    Physical Exam: Vitals:   12/05/18 0230 12/05/18 0245 12/05/18 0300 12/05/18 0352  BP: (!) 149/95 (!) 147/79 (!) 157/78 140/74  Pulse: 94 92 91 93  Resp: 12 11 12 15   Temp:    98.3 F (36.8 C)  TempSrc:    Oral  SpO2: 94% 93% 93% 94%   General: Not in acute distress HEENT:       Eyes: PERRL, EOMI, no scleral icterus.       ENT: No discharge from the ears and nose, no pharynx injection, no tonsillar enlargement.        Neck: No JVD, no bruit, no mass felt. Heme: No neck lymph node enlargement. Cardiac: S1/S2, RRR, No murmurs, No gallops or rubs. Respiratory:  No rales, wheezing, rhonchi or rubs. GI: Soft, nondistended, has mild suprapubic tenderness, no rebound pain, no organomegaly, BS  present. GU: No hematuria Ext: No pitting leg edema bilaterally. 2+DP/PT pulse bilaterally. Musculoskeletal: No joint deformities, No joint redness or warmth, no limitation of ROM in spin. Skin: No rashes.  Neuro: Alert, oriented X3, cranial nerves II-XII grossly intact, moves all extremities normally. Labs on Admission: I have personally reviewed following labs and imaging studies  CBC: Recent Labs  Lab 12/04/18 1734 12/05/18 0146  WBC 8.8 9.7  HGB 10.2* 9.6*  HCT 29.9* 28.0*  MCV 94.0 95.2  PLT 307 [id number]   Basic Metabolic Panel: Recent Labs  Lab 12/04/18 1734  NA 136  K 3.8  CL 102  CO2 21*  GLUCOSE 119*  BUN 39*  CREATININE 1.66*  CALCIUM 9.0   GFR: CrCl cannot be calculated (Unknown ideal weight.). Liver Function Tests: Recent Labs  Lab 12/04/18 1734  AST 24  ALT 23  ALKPHOS 56  BILITOT 0.4  PROT 6.0*  ALBUMIN 3.7   No results for input(s): LIPASE, AMYLASE in the  last 168 hours. No results for input(s): AMMONIA in the last 168 hours. Coagulation Profile: Recent Labs  Lab 12/04/18 1734  INR 1.0   Cardiac Enzymes: No results for input(s): CKTOTAL, CKMB, CKMBINDEX, TROPONINI in the last 168 hours. BNP (last 3 results) No results for input(s): PROBNP in the last 8760 hours. HbA1C: No results for input(s): HGBA1C in the last 72 hours. CBG: No results for input(s): GLUCAP in the last 168 hours. Lipid Profile: No results for input(s): CHOL, HDL, LDLCALC, TRIG, CHOLHDL, LDLDIRECT in the last 72 hours. Thyroid Function Tests: No results for input(s): TSH, T4TOTAL, FREET4, T3FREE, THYROIDAB in the last 72 hours. Anemia Panel: No results for input(s): VITAMINB12, FOLATE, FERRITIN, TIBC, IRON, RETICCTPCT in the last 72 hours. Urine analysis:    Component Value Date/Time   COLORURINE YELLOW 12/04/2018 2026   APPEARANCEUR HAZY (A) 12/04/2018 2026   LABSPEC 1.021 12/04/2018 2026   PHURINE 5.0 12/04/2018 2026   GLUCOSEU NEGATIVE 12/04/2018 2026   HGBUR  MODERATE (A) 12/04/2018 2026   BILIRUBINUR NEGATIVE 12/04/2018 2026   KETONESUR NEGATIVE 12/04/2018 2026   PROTEINUR NEGATIVE 12/04/2018 2026   NITRITE NEGATIVE 12/04/2018 2026   LEUKOCYTESUR LARGE (A) 12/04/2018 2026   Sepsis Labs: @LABRCNTIP (procalcitonin:4,lacticidven:4) ) Recent Results (from the past 240 hour(s))  SARS Coronavirus 2 [facility] order, Performed in [facility] hospital lab) Nasopharyngeal Nasopharyngeal Swab     Status: None   Collection Time: 12/04/18 10:59 PM   Specimen: Nasopharyngeal Swab  Result Value Ref Range Status   SARS Coronavirus 2 NEGATIVE NEGATIVE Final    Comment: (NOTE) If result is NEGATIVE SARS-CoV-2 target nucleic acids are NOT DETECTED. The SARS-CoV-2 RNA is generally detectable in upper and lower  respiratory specimens during the acute phase of infection. The lowest  concentration of SARS-CoV-2 viral copies this assay can detect is 250  copies / mL. A negative result does not preclude SARS-CoV-2 infection  and should not be used as the sole basis for treatment or other  patient management decisions.  A negative result may occur with  improper specimen collection / handling, submission of specimen other  than nasopharyngeal swab, presence of viral mutation(s) within the  areas targeted by this assay, and inadequate number of viral copies  (<250 copies / mL). A negative result must be combined with clinical  observations, patient history, and epidemiological information. If result is POSITIVE SARS-CoV-2 target nucleic acids are DETECTED. The SARS-CoV-2 RNA is generally detectable in upper and lower  respiratory specimens dur ing the acute phase of infection.  Positive  results are indicative of active infection with SARS-CoV-2.  Clinical  correlation with patient history and other diagnostic information is  necessary to determine patient infection status.  Positive results do  not rule out bacterial infection or co-infection with other  viruses. If result is PRESUMPTIVE POSTIVE SARS-CoV-2 nucleic acids MAY BE PRESENT.   A presumptive positive result was obtained on the submitted specimen  and confirmed on repeat testing.  While 2019 novel coronavirus  (SARS-CoV-2) nucleic acids may be present in the submitted sample  additional confirmatory testing may be necessary for epidemiological  and / or clinical management purposes  to differentiate between  SARS-CoV-2 and other Sarbecovirus currently known to infect humans.  If clinically indicated additional testing with an alternate test  methodology [phone]) is advised. The SARS-CoV-2 RNA is generally  detectable in upper and lower respiratory sp ecimens during the acute  phase of infection. The expected result is Negative. Fact Sheet for Patients:  StrictlyIdeas.no Fact Sheet for Healthcare Providers: BankingDealers.co.za This test is not yet approved or cleared by the [place] FDA and has been authorized for detection and/or diagnosis of SARS-CoV-2 by FDA under an Emergency Use Authorization (EUA).  This EUA will remain in effect (meaning this test can be used) for the duration of the COVID-19 declaration under Section 564(b)(1) of the Act, 21 U.S.C. section 360bbb-3(b)(1), unless the authorization is terminated or revoked sooner. Performed at [facility], [place] [address], [place], [place] [address]      Radiological Exams on Admission: Ct Abdomen Pelvis W Contrast  Result Date: 12/04/2018 CLINICAL DATA:  Abdominal pain, anal and vaginal bleeding EXAM: CT ABDOMEN AND PELVIS WITH CONTRAST TECHNIQUE: Multidetector CT imaging of the abdomen and pelvis was performed using the standard protocol following bolus administration of intravenous contrast. CONTRAST:  50mL OMNIPAQUE IOHEXOL 300 MG/ML  SOLN COMPARISON:  None. FINDINGS: Lower chest: Lung bases demonstrate no acute consolidation or effusion. Heart size within  normal limits. Hepatobiliary: No focal liver abnormality is seen. No gallstones, gallbladder wall thickening, or biliary dilatation. Pancreas: Atrophic.  No inflammatory change Spleen: Normal in size without focal abnormality. Adrenals/Urinary Tract: Adrenal glands are unremarkable. Kidneys are normal, without renal calculi, focal lesion, or hydronephrosis. Bladder is unremarkable. Stomach/Bowel: Stomach is within normal limits. Appendix appears normal. No evidence of bowel wall thickening, distention, or inflammatory changes. Vascular/Lymphatic: Moderate aortic atherosclerosis. No aneurysm. No significantly enlarged lymph nodes. Reproductive: Uterus and bilateral adnexa are unremarkable. Other: Negative for free air or free fluid. Musculoskeletal: Right hip replacement. Scoliosis of the spine. Moderate compression deformity T12 with about 40% loss of vertebral body height. Mild inferior endplate deformity with anterior wedging at L1. Mild superior endplate deformity at L4. IMPRESSION: 1. No CT evidence for acute intra-abdominal or pelvic abnormality. 2. Multiple compression deformities of the thoracolumbar spine of uncertain age Electronically Signed   By: Donavan Foil M.D.   On: 12/04/2018 22:07     EKG: Independently reviewed.  Sinus rhythm, QTC 408, low voltage, ST depression in inferior leads, V4-V6, early R wave progression  Assessment/Plan Principal Problem:   GIB (gastrointestinal bleeding) Active Problems:   COPD (chronic obstructive pulmonary disease) (HCC)   Seizure (HCC)   PAF (paroxysmal atrial fibrillation) (HCC)   EtOH dependence (HCC)   Essential hypertension   UTI (urinary tract infection)   HLD (hyperlipidemia)   GERD (gastroesophageal reflux disease)   Depression   AKI (acute kidney injury) (HCC)   Stroke (cerebrum) (HCC)   Chronic diastolic CHF (congestive heart failure) (HCC)   GIB (gastrointestinal bleeding): Possible upper GI bleeding.  Hgb dropped from  13.4-->10.2-->9.6.  Hemodynamically stable.  Patient is taking aspirin ibuprofen. She is alcohol dependent, may have alcoholic gastritis.  Liver cirrhosis due to alcohol abuse is also possible. Her liver function is normal. GI, Dr. Cristina Gong was consulted by EDP  - will admit to tele bed as inpt - GI consulted by Ed, will follow up recommendations which is highly appreciated - NPO  - IVF: 500 cc NS bolus - Start IV pantoprazole gtt - Zofran IV for nausea - Avoid NSAIDs and SQ heparin - Maintain IV access (2 large bore IVs if possible). - Monitor closely and follow q6h cbc, transfuse as necessary, if Hgb<7.0 - LaB: INR, PTT and type screen  Seizure -Seizure precaution -When necessary Ativan for seizure -Continue Home medications: Keppra  PAF (paroxysmal atrial fibrillation) ([place]): CHA2DS2-VASc Score is 5, needs oral anticoagulation, but pt is not on  AC. Now has GIB. Heart rate is well controlled. -Metoprolol   HTN:  -Continue home medications: Metoprolol -hold spironolactone due to AKI -IV hydralazine prn  EtOH dependence ([place]): -CiWA protocol  UTI (urinary tract infection): -rocephine -f/u Bx and Ux  HLD (hyperlipidemia): -lipitor  GERD (gastroesophageal reflux disease): -on IV protonix  Depression: -Celexa  AKI: Likely due to dehydration and continuation of diuretics - IVF as above - Follow up renal function by BMP - Hold   Hx of Stroke (cerebrum) (HCC) -hold ASA due to GIB  Chronic diastolic CHF (congestive heart failure) ([place]): 2D echo on 11/01/2016 showed EF 55-60 % with grade 1 diastolic dysfunction.  Patient does not have leg edema or JVD.  No shortness of breath.  CHF seem to be compensated. -Hold lisinopril due to AKI.  COPD (chronic obstructive pulmonary disease) ([place]): stable -prn albuterol   Inpatient status:  # Patient requires inpatient status due to high intensity of service, high risk for further deterioration and high frequency of surveillance  required.  I certify that at the point of admission it is my clinical judgment that the patient will require inpatient hospital care spanning beyond 2 midnights from the point of admission.   This patient has multiple chronic comorbidities including hypertension, hyperlipidemia, COPD, stroke, depression, PRES, atrial fibrillation not on anticoagulants, asthma, dCHF, alcohol abuse, seizure  Now patient has presenting with hematemesis, possible upper GI bleeding  The initial radiographic and laboratory data are worrisome because of hemoglobin dropped from 13.4-9.6.  Also has AKI and positive urinalysis.   Current medical needs: please see my assessment and plan  Predictability of an adverse outcome (risk): Patient has multiple comorbidities, now presents with possible upper GI bleeding.  Hemoglobin dropped from 13.4-9.6.  Patient will need EGD.  Given history of aspirin and ibuprofen use and alcohol dependency, patient may have peptic ulcer and liver cirrhosis, patient is at high risk of developing massive upper GI bleeding.  At high risk of deteriorating.  Patient will need to be treated in hospital for at least 2 days.        DVT ppx: SCD Code Status: Full code Family Communication: None at bed side.   Disposition Plan:  Anticipate discharge back to previous home environment Consults called:  Dr. Cristina Gong Admission status:  Inpatient/tele     Date of Service 12/05/2018    [facility] [facility]   If 7PM-7AM, please contact night-coverage [email or web address] Password [email or web address] 12/05/2018, 3:55 AM

## 2018-12-04 NOTE — ED Provider Notes (Signed)
[place] EMERGENCY DEPARTMENT Provider Note   CSN: [id number] Arrival date & time: 12/04/18  1714   History   Chief Complaint Chief Complaint  Patient presents with  . Hematemesis   HPI Kim Blankenship is a 73 y.o. female with past medical history significant for A. fib, COPD, hypertension, press, CVA who presents for evaluation of hematemesis. Patient states she has had multiple episodes of blood in her emesis.  Patient states this will alternate between bright red and dark red.  Patient states she is also had some melena with dark tarry stools and hematuria. Patient states she has lower abdominal pain however this is chronic in nature.  hematuria chronic in nature. Has also had diarrhea and dysuria which she states she has had this for "a long time."  Denies headache, fever, chills, chest pain, shortness of breath, weakness, facial droop.  Denies additional aggravating or alleviating factors. No anticoagulation. Takes ASA 81 mg daily. She does frequent NSAID 4-6 pills daily x months for restless leg syndrome. No prior hx of GI bleeds. Tolerating PO intake without difficulty.  History obtained from patient and past medical records.  No interpreter was used.   Prior abd surgeries: None PCP- [facility]- None    HPI  Past Medical History:  Diagnosis Date  . Asthma   . Atrial fibrillation ([place])   . COPD (chronic obstructive pulmonary disease) ([place])   . Hypertension   . Irregular heart rate   . PRES (posterior reversible encephalopathy syndrome)   . Stroke [facility])     Patient Active Problem List   Diagnosis Date Noted  . GIB (gastrointestinal bleeding) 12/04/2018  . Chronic diastolic CHF (congestive heart failure) ([place]) 12/04/2018  . 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 AKI (acute kidney injury) ([place]) 11/21/2016  . Stroke (cerebrum) ([place]) 11/21/2016  . Hyponatremia 11/20/2016  . UTI (urinary tract infection)  11/20/2016  . HLD (hyperlipidemia) 11/20/2016  . GERD (gastroesophageal reflux disease) 11/20/2016  . Depression 11/20/2016  . Hypertensive emergency 11/04/2016  . EtOH dependence ([place]) 11/04/2016  . Hyperglycemia 11/04/2016  . Essential hypertension   . Hypertensive urgency 10/30/2016  . PAF (paroxysmal atrial fibrillation) ([place]) 10/30/2016  . PRES (posterior reversible encephalopathy syndrome) 10/30/2016  . Seizure ([place])   . Acute blood loss anemia   . Closed fracture of right hip ([place]) 02/09/2016  . Chest pain 11/07/2015  . COPD (chronic obstructive pulmonary disease) ([place]) 08/19/2013  . Abnormal EKG 08/19/2013  . Fever, unspecified 08/19/2013    Past Surgical History:  Procedure Laterality Date  . CESAREAN SECTION    . Left Foot Reconstruction    . OPEN REDUCTION INTERNAL FIXATION (ORIF) METACARPAL Right 03/10/2015   Procedure: OPEN REDUCTION INTERNAL FIXATION (ORIF) RIGHT LONG  METACARPAL FRACTURE;  Surgeon: Leanora Cover, MD;  Location: [place];  Service: Orthopedics;  Laterality: Right;  . TONSILLECTOMY    . TOTAL HIP ARTHROPLASTY Right 02/09/2016   Procedure: TOTAL HIP ARTHROPLASTY ANTERIOR APPROACH;  Surgeon: Frederik Pear, MD;  [facility];  Service: Orthopedics;  Laterality: Right;     OB History    Gravida      Para      Term      Preterm      AB      Living  1     SAB      TAB      Ectopic      Multiple  Live Births               Home Medications    Prior to Admission medications   Medication Sig Start Date End Date Taking? Authorizing Provider  albuterol (PROVENTIL HFA;VENTOLIN HFA) 108 (90 Base) MCG/ACT inhaler Inhale 2 puffs into the lungs every 6 (six) hours as needed for wheezing or shortness of breath.    [provider]  aspirin EC 81 MG tablet Take 81 mg by mouth daily.    [provider]  atorvastatin (LIPITOR) 20 MG tablet Take 20 mg by mouth daily. 04/08/16   [provider]   budesonide-formoterol (SYMBICORT) 160-4.5 MCG/ACT inhaler Inhale 2 puffs into the lungs 2 (two) times daily. 05/27/18   [provider]  citalopram (CELEXA) 10 MG tablet Take 10 mg by mouth daily.    [provider]  ipratropium-albuterol (DUONEB) 0.5-2.5 (3) MG/3ML SOLN Take 3 mLs by nebulization every 6 (six) hours as needed.    [provider]  levETIRAcetam (KEPPRA) 500 MG tablet Take 1 tablet (500 mg total) by mouth 2 (two) times daily. 11/06/16   Florencia Reasons, MD  metoprolol tartrate (LOPRESSOR) 25 MG tablet Take 1 tablet (25 mg total) by mouth 2 (two) times daily. Patient taking differently: Take 25-50 mg by mouth 2 (two) times daily. 50MG  in the morning and 25MG  in the evening 11/06/16   Florencia Reasons, MD  Multiple Vitamins-Minerals (MULTIVITAMIN ADULT PO) Take 1 tablet by mouth daily.    [provider]  spironolactone (ALDACTONE) 25 MG tablet Take 25 mg by mouth daily.    [provider]    Family History Family History  Problem Relation Age of Onset  . Dementia Father     Social History Social History   Tobacco Use  . Smoking status: Former Smoker    Packs/day: 2.00    Years: 30.00    Pack years: 60.00    Types: Cigarettes    Quit date: 04/1995    Years since quitting: 23.6  . Smokeless tobacco: Never Used  Substance Use Topics  . Alcohol use: Yes    Comment: wine daily  . Drug use: No     Allergies   Tape   Review of Systems Review of Systems  Constitutional: Negative.   HENT: Negative.   Gastrointestinal: Positive for abdominal pain, blood in stool, diarrhea (Chronic), nausea and vomiting. Negative for abdominal distention and constipation.  Genitourinary: Positive for dysuria and hematuria. Negative for decreased urine volume, difficulty urinating, flank pain, frequency, urgency, vaginal bleeding, vaginal discharge and vaginal pain. Dyspareunia: Chronic.  Musculoskeletal: Negative.  Negative for gait problem.  Skin: Negative.    Neurological: Negative.   All other systems reviewed and are negative.    Physical Exam Updated Vital Signs BP (!) 159/87 (BP Location: Left Arm)   Pulse 91   Temp 98.8 F (37.1 C) (Oral)   Resp 14   SpO2 91%   Physical Exam Vitals signs and nursing note reviewed.  Constitutional:      General: She is not in acute distress.    Appearance: She is well-developed. She is not ill-appearing or toxic-appearing.  HENT:     Head: Atraumatic.     Nose: Nose normal.     Mouth/Throat:     Mouth: Mucous membranes are moist.     Pharynx: Oropharynx is clear.  Eyes:     Pupils: Pupils are equal, round, and reactive to light.  Neck:     Musculoskeletal: Normal  range of motion.  Cardiovascular:     Rate and Rhythm: Normal rate.     Pulses: Normal pulses.     Heart sounds: Normal heart sounds. No friction rub. No gallop.   Pulmonary:     Effort: Pulmonary effort is normal. No respiratory distress.     Breath sounds: Normal breath sounds.     Comments: On [place] oxygen at abseline Abdominal:     General: There is no distension.     Comments: Soft without rebound or guarding.  Generalized tenderness to the subrapubic region.  Musculoskeletal: Normal range of motion.     Comments: Moves all 4 extremities without difficulty.  Skin:    General: Skin is warm and dry.     Comments: Brisk capillary refill.  Neurological:     General: No focal deficit present.     Mental Status: She is alert and oriented to person, place, and time.     Motor: No weakness.     Comments: Cranial nerves II through XII grossly intact.  No facial droop.    ED Treatments / Results  Labs (all labs ordered are listed, but only abnormal results are displayed) Labs Reviewed  COMPREHENSIVE METABOLIC PANEL - Abnormal; Notable for the following components:      Result Value   CO2 21 (*)    Glucose, Bld 119 (*)    BUN 39 (*)    Creatinine, Ser 1.66 (*)    Total Protein 6.0 (*)    GFR calc non Af Amer 31 (*)     GFR calc Af Amer 36 (*)    All other components within normal limits  CBC - Abnormal; Notable for the following components:   RBC 3.18 (*)    Hemoglobin 10.2 (*)    HCT 29.9 (*)    All other components within normal limits  URINALYSIS, ROUTINE W REFLEX MICROSCOPIC - Abnormal; Notable for the following components:   APPearance HAZY (*)    Hgb urine dipstick MODERATE (*)    Leukocytes,Ua LARGE (*)    Bacteria, UA RARE (*)    All other components within normal limits  POC OCCULT BLOOD, ED - Abnormal; Notable for the following components:   Fecal Occult Bld POSITIVE (*)    All other components within normal limits  URINE CULTURE  SARS CORONAVIRUS 2 (HOSPITAL ORDER, [facility])  PROTIME-INR  LACTIC ACID, PLASMA  LACTIC ACID, PLASMA  CBC  BASIC METABOLIC PANEL  CBC  CBC  POC OCCULT BLOOD, ED  TYPE AND SCREEN  ABO/RH    EKG EKG Interpretation  Date/Time:  Friday December 04 2018 17:26:43 EDT Ventricular Rate:  88 PR Interval:    QRS Duration: 95 QT Interval:  337 QTC Calculation: 408 R Axis:   81 Text Interpretation:  Sinus rhythm Borderline short PR interval Borderline right axis deviation Repol abnrm suggests ischemia, anterolateral Confirmed by Dene Gentry [phone]) on 12/04/2018 5:31:33 PM   Radiology Ct Abdomen Pelvis W Contrast  Result Date: 12/04/2018 CLINICAL DATA:  Abdominal pain, anal and vaginal bleeding EXAM: CT ABDOMEN AND PELVIS WITH CONTRAST TECHNIQUE: Multidetector CT imaging of the abdomen and pelvis was performed using the standard protocol following bolus administration of intravenous contrast. CONTRAST:  52mL OMNIPAQUE IOHEXOL 300 MG/ML  SOLN COMPARISON:  None. FINDINGS: Lower chest: Lung bases demonstrate no acute consolidation or effusion. Heart size within normal limits. Hepatobiliary: No focal liver abnormality is seen. No gallstones, gallbladder wall thickening, or biliary dilatation. Pancreas: Atrophic.  No inflammatory change  Spleen: Normal in size without focal abnormality. Adrenals/Urinary Tract: Adrenal glands are unremarkable. Kidneys are normal, without renal calculi, focal lesion, or hydronephrosis. Bladder is unremarkable. Stomach/Bowel: Stomach is within normal limits. Appendix appears normal. No evidence of bowel wall thickening, distention, or inflammatory changes. Vascular/Lymphatic: Moderate aortic atherosclerosis. No aneurysm. No significantly enlarged lymph nodes. Reproductive: Uterus and bilateral adnexa are unremarkable. Other: Negative for free air or free fluid. Musculoskeletal: Right hip replacement. Scoliosis of the spine. Moderate compression deformity T12 with about 40% loss of vertebral body height. Mild inferior endplate deformity with anterior wedging at L1. Mild superior endplate deformity at L4. IMPRESSION: 1. No CT evidence for acute intra-abdominal or pelvic abnormality. 2. Multiple compression deformities of the thoracolumbar spine of uncertain age Electronically Signed   By: Donavan Foil M.D.   On: 12/04/2018 22:07    Procedures Procedures (including critical care time)  Medications Ordered in ED Medications  pantoprazole (PROTONIX) injection 40 mg (40 mg Intravenous Given 12/04/18 2254)  LORazepam (ATIVAN) injection 1 mg (has no administration in time range)  pantoprazole (PROTONIX) injection 40 mg (40 mg Intravenous Given 12/04/18 1806)  sodium chloride 0.9 % bolus 500 mL (500 mLs Intravenous New Bag/Given 12/04/18 1806)  cefTRIAXone (ROCEPHIN) 1 g in sodium chloride 0.9 % 100 mL IVPB (0 g Intravenous Stopped 12/04/18 2229)  iohexol (OMNIPAQUE) 300 MG/ML solution 80 mL (80 mLs Intravenous Contrast Given 12/04/18 2140)    Initial Impression / Assessment and Plan / ED Course  I have reviewed the triage vital signs and the nursing notes.  Pertinent labs & imaging results that were available during my care of the patient were reviewed by me and considered in my medical decision making (see chart for  details).  73 year old female presents for evaluation of hematemesis, melena, hematuria.  No anticoagulation however she does use 4-6 NSAIDs daily for restless leg syndrome.  Abdomen with mild tenderness to lower quadrants diffusely.  Chronic dysuria and diarrhea.  She is not followed by GI.  Concern for upper GI bleed given NSAID use.  She is afebrile, nonseptic, non-ill-appearing.  Vital signs stable.  Will obtain labs, give Protonix, CT scan, occults and reevaluate.  Labs and imaging personally reviewed:  Metabolic panel with BUN elevated at 39 creatinine 1.66 CBC without leukocytosis however hemoglobin 10.2, this is a three-point drop from her last labs approximate 10 months ago Lactic acid 1.5 INR 1.0 POC occult postive Urinalysis hematuria and UTI.  Will culture.  Will give Rocephin. CT AP without acute findings EKG without ST/T changes COVID pending  2100: Consulted with GI.  They will evaluate patient. Patient likely with acute upper GI bleed given elevated BUN, drop in hemoglobin, occult positive.  Likely source from chronic NSAID use.  2230: Dr. Cristina Gong from [facility] has evaluated patient. Will perform EGD likely tomorrow however this will depend on anesthesia given COPD.  2254: Consulted Dr. Blaine Hamper with [facility] who will evaluate patient for admission.  She is hemodynamically stable.  She has been given Protonix and fluids. Urinalysis with UTI given Rocephin.  No evidence of sirs or sepsis.  Patient has not wanted anything for pain at this time. No need for transfusion at this time.  Patient discussed with attending physician, Dr. Francia Greaves who agrees with above treatment, plan and disposition.         The patient appears reasonably stabilized for admission considering the current resources, flow, and capabilities available in the ED at this time, and I doubt any  other [facility] requiring further screening and/or treatment in the ED prior to admission. Final Clinical Impressions(s) / ED Diagnoses    Final diagnoses:  Hematemesis with nausea  Upper GI bleed  Melena  Acute cystitis with hematuria  AKI (acute kidney injury) [facility])    ED Discharge Orders    None       Pepe Mineau A, PA-C 12/04/18 2258    Valarie Merino, MD 12/10/18 2241

## 2018-12-04 NOTE — Consult Note (Signed)
Referring Provider:  Dr. Dene Gentry Primary Care Physician:  Gregor Hams, FNP Primary Gastroenterologist: None (unassigned)  Reason for Consultation: GI bleeding (hematemesis and melena)  HPI: Kim Blankenship is a 73 y.o. female with COPD who is being admitted through the emergency room tonight with her first-ever GI bleed, characterized by 2 episodes of hematemesis 2 days ago plus another episode yesterday morning, followed by multiple episodes of black diarrhea yesterday and today.    Upon presentation to the emergency room, her initial hemoglobin of 10.2 reflected a moderate decrease from her baseline of 13.4 when checked 8 months ago; in addition, her BUN was up to 39.    She is on a daily 81 mg aspirin tablet and uses about 20 ibuprofen tablets per week.  However, there is no prior history of ulcer disease or GI bleeding.     Past Medical History:  Diagnosis Date  . Asthma   . Atrial fibrillation ([place])   . COPD (chronic obstructive pulmonary disease) ([place])   . Hypertension   . Irregular heart rate   . PRES (posterior reversible encephalopathy syndrome)   . Stroke [facility])     Past Surgical History:  Procedure Laterality Date  . CESAREAN SECTION    . Left Foot Reconstruction    . OPEN REDUCTION INTERNAL FIXATION (ORIF) METACARPAL Right 03/10/2015   Procedure: OPEN REDUCTION INTERNAL FIXATION (ORIF) RIGHT LONG  METACARPAL FRACTURE;  Surgeon: Leanora Cover, MD;  Location: [place];  Service: Orthopedics;  Laterality: Right;  . TONSILLECTOMY    . TOTAL HIP ARTHROPLASTY Right 02/09/2016   Procedure: TOTAL HIP ARTHROPLASTY ANTERIOR APPROACH;  Surgeon: Frederik Pear, MD;  Location[facility];  Service: Orthopedics;  Laterality: Right;    Prior to Admission medications   Medication Sig Start Date End Date Taking? Authorizing Provider  albuterol (PROVENTIL HFA;VENTOLIN HFA) 108 (90 Base) MCG/ACT inhaler Inhale 2 puffs into the lungs every 6 (six) hours as needed for  wheezing or shortness of breath.    [provider]  aspirin EC 81 MG tablet Take 81 mg by mouth daily.    [provider]  atorvastatin (LIPITOR) 20 MG tablet Take 20 mg by mouth daily. 04/08/16   [provider]  budesonide-formoterol (SYMBICORT) 160-4.5 MCG/ACT inhaler Inhale 2 puffs into the lungs 2 (two) times daily. 05/27/18   [provider]  citalopram (CELEXA) 10 MG tablet Take 10 mg by mouth daily.    [provider]  ipratropium-albuterol (DUONEB) 0.5-2.5 (3) MG/3ML SOLN Take 3 mLs by nebulization every 6 (six) hours as needed.    [provider]  levETIRAcetam (KEPPRA) 500 MG tablet Take 1 tablet (500 mg total) by mouth 2 (two) times daily. 11/06/16   Florencia Reasons, MD  metoprolol tartrate (LOPRESSOR) 25 MG tablet Take 1 tablet (25 mg total) by mouth 2 (two) times daily. Patient taking differently: Take 25-50 mg by mouth 2 (two) times daily. 50MG  in the morning and 25MG  in the evening 11/06/16   Florencia Reasons, MD  Multiple Vitamins-Minerals (MULTIVITAMIN ADULT PO) Take 1 tablet by mouth daily.    [provider]  spironolactone (ALDACTONE) 25 MG tablet Take 25 mg by mouth daily.    [provider]    No current facility-administered medications for this encounter.    Current Outpatient Medications  Medication Sig Dispense Refill  . albuterol (PROVENTIL HFA;VENTOLIN HFA) 108 (90 Base) MCG/ACT inhaler Inhale 2 puffs into the lungs every 6 (six) hours as needed for wheezing  or shortness of breath.    Marland Kitchen aspirin EC 81 MG tablet Take 81 mg by mouth daily.    Marland Kitchen atorvastatin (LIPITOR) 20 MG tablet Take 20 mg by mouth daily.    . budesonide-formoterol (SYMBICORT) 160-4.5 MCG/ACT inhaler Inhale 2 puffs into the lungs 2 (two) times daily.    . citalopram (CELEXA) 10 MG tablet Take 10 mg by mouth daily.    Marland Kitchen ipratropium-albuterol (DUONEB) 0.5-2.5 (3) MG/3ML SOLN Take 3 mLs by nebulization every 6 (six) hours as needed.    . levETIRAcetam  (KEPPRA) 500 MG tablet Take 1 tablet (500 mg total) by mouth 2 (two) times daily. 60 tablet 0  . metoprolol tartrate (LOPRESSOR) 25 MG tablet Take 1 tablet (25 mg total) by mouth 2 (two) times daily. (Patient taking differently: Take 25-50 mg by mouth 2 (two) times daily. 50MG  in the morning and 25MG  in the evening) 60 tablet 0  . Multiple Vitamins-Minerals (MULTIVITAMIN ADULT PO) Take 1 tablet by mouth daily.    Marland Kitchen spironolactone (ALDACTONE) 25 MG tablet Take 25 mg by mouth daily.      Allergies as of 12/04/2018 - Review Complete 12/04/2018  Allergen Reaction Noted  . Tape Other (See Comments) 11/04/2016    Family History  Problem Relation Age of Onset  . Dementia Father     Social History   Socioeconomic History  . Marital status: Divorced    Spouse name: Not on file  . Number of children: 1  . Years of education: Not on file  . Highest education level: Not on file  Occupational History  . Not on file  Social Needs  . Financial resource strain: Not on file  . Food insecurity    Worry: Not on file    Inability: Not on file  . Transportation needs    Medical: Not on file    Non-medical: Not on file  Tobacco Use  . Smoking status: Former Smoker    Packs/day: 2.00    Years: 30.00    Pack years: 60.00    Types: Cigarettes    Quit date: 04/1995    Years since quitting: 23.6  . Smokeless tobacco: Never Used  Substance and Sexual Activity  . Alcohol use: Yes    Comment: wine daily  . Drug use: No  . Sexual activity: Not on file  Lifestyle  . Physical activity    Days per week: Not on file    Minutes per session: Not on file  . Stress: Not on file  Relationships  . Social Herbalist on phone: Not on file    Gets together: Not on file    Attends religious service: Not on file    Active member of club or organization: Not on file    Attends meetings of clubs or organizations: Not on file    Relationship status: Not on file  . Intimate partner violence     Fear of current or ex partner: Not on file    Emotionally abused: Not on file    Physically abused: Not on file    Forced sexual activity: Not on file  Other Topics Concern  . Not on file  Social History Narrative   Lives with daughter and her son   Caffeine use: 1 cup coffee every morning    Review of Systems: Not on home O2, is able to walk a couple of blocks.  No chest pain.  No skin rashes or joint swelling, no urinary  symptoms, no leg swelling  Physical Exam: Vital signs in last 24 hours: Temp:  [98.8 F (37.1 C)] 98.8 F (37.1 C) (09/04 1737) Pulse Rate:  [90-91] 91 (09/04 1737) Resp:  [11-14] 14 (09/04 1737) BP: (159)/(87) 159/87 (09/04 1737) SpO2:  [91 %-93 %] 91 % (09/04 1737)   General:   Alert, somewhat chronically ill-appearing Caucasian female in no distress Head:  Normocephalic and atraumatic. Eyes:  Sclera clear, no icterus.    Lungs: Few soft expiratory rhonchi, reasonably good air movement, no respiratory distress or use of accessory muscles. Heart:   Regular rate and rhythm; no murmurs, clicks, rubs,  or gallops. Abdomen:  Soft, nontender,and nondistended. No masses, hepatosplenomegaly or ventral hernias noted. Msk:   Symmetrical without gross deformities. Extremities:   Without edema. Neurologic:  Alert and coherent;  grossly normal neurologically. Skin:  Intact without significant lesions or rashes. Psych:   Alert and cooperative. Normal mood and affect.  Intake/Output from previous day: No intake/output data recorded. Intake/Output this shift: No intake/output data recorded.  Lab Results: Recent Labs    12/04/18 1734  WBC 8.8  HGB 10.2*  HCT 29.9*  PLT 307   BMET Recent Labs    12/04/18 1734  NA 136  K 3.8  CL 102  CO2 21*  GLUCOSE 119*  BUN 39*  CREATININE 1.66*  CALCIUM 9.0   LFT Recent Labs    12/04/18 1734  PROT 6.0*  ALBUMIN 3.7  AST 24  ALT 23  ALKPHOS 56  BILITOT 0.4   PT/INR Recent Labs    12/04/18 1734  LABPROT  13.4  INR 1.0    Studies/Results: Ct Abdomen Pelvis W Contrast  Result Date: 12/04/2018 CLINICAL DATA:  Abdominal pain, anal and vaginal bleeding EXAM: CT ABDOMEN AND PELVIS WITH CONTRAST TECHNIQUE: Multidetector CT imaging of the abdomen and pelvis was performed using the standard protocol following bolus administration of intravenous contrast. CONTRAST:  7mL OMNIPAQUE IOHEXOL 300 MG/ML  SOLN COMPARISON:  None. FINDINGS: Lower chest: Lung bases demonstrate no acute consolidation or effusion. Heart size within normal limits. Hepatobiliary: No focal liver abnormality is seen. No gallstones, gallbladder wall thickening, or biliary dilatation. Pancreas: Atrophic.  No inflammatory change Spleen: Normal in size without focal abnormality. Adrenals/Urinary Tract: Adrenal glands are unremarkable. Kidneys are normal, without renal calculi, focal lesion, or hydronephrosis. Bladder is unremarkable. Stomach/Bowel: Stomach is within normal limits. Appendix appears normal. No evidence of bowel wall thickening, distention, or inflammatory changes. Vascular/Lymphatic: Moderate aortic atherosclerosis. No aneurysm. No significantly enlarged lymph nodes. Reproductive: Uterus and bilateral adnexa are unremarkable. Other: Negative for free air or free fluid. Musculoskeletal: Right hip replacement. Scoliosis of the spine. Moderate compression deformity T12 with about 40% loss of vertebral body height. Mild inferior endplate deformity with anterior wedging at L1. Mild superior endplate deformity at L4. IMPRESSION: 1. No CT evidence for acute intra-abdominal or pelvic abnormality. 2. Multiple compression deformities of the thoracolumbar spine of uncertain age Electronically Signed   By: Donavan Foil M.D.   On: 12/04/2018 22:07    Impression: 1.  Upper GI bleed, currently quiescent, presumably from NSAID gastropathy.  2.  Posthemorrhagic anemia, moderate.  Plan: 1.  IV pantoprazole 2.  Endoscopic evaluation when available  on the schedule (anesthesia support, which I think would be prudent in this patient with COPD, may not be available tomorrow).  The patient is currently clinically stable and there is no need for urgent endoscopic evaluation.  However, I did discuss the nature, purpose, and  risks of the procedure with the patient and she is agreeable to proceed, and I do feel it would be helpful in her management going forward, to clarify the etiology of her bleeding and help guide future medical therapy.   LOS: 0 days   Youlanda Mighty Amberlea Spagnuolo  12/04/2018, 10:14 PM   Pager [phone] If no answer or after 5 PM call [phone]

## 2018-12-04 NOTE — ED Triage Notes (Signed)
Pt bib gcems with c/o N/V/D, states she has been vomiting blood and blood in stool. States this has been going on since weds.

## 2018-12-05 DIAGNOSIS — R569 Unspecified convulsions: Secondary | ICD-10-CM

## 2018-12-05 DIAGNOSIS — N390 Urinary tract infection, site not specified: Secondary | ICD-10-CM

## 2018-12-05 DIAGNOSIS — R319 Hematuria, unspecified: Secondary | ICD-10-CM

## 2018-12-05 LAB — CBC
HCT: 28 % — ABNORMAL LOW (ref 36.0–46.0)
HCT: 26.4 % — ABNORMAL LOW (ref 36.0–46.0)
HCT: 26.6 % — ABNORMAL LOW (ref 36.0–46.0)
Hemoglobin: 9.6 g/dL — ABNORMAL LOW (ref 12.0–15.0)
Hemoglobin: 9.2 g/dL — ABNORMAL LOW (ref 12.0–15.0)
Hemoglobin: 9.3 g/dL — ABNORMAL LOW (ref 12.0–15.0)
MCH: 32.7 pg (ref 26.0–34.0)
MCH: 32.6 pg (ref 26.0–34.0)
MCH: 33 pg (ref 26.0–34.0)
MCHC: 34.3 g/dL (ref 30.0–36.0)
MCHC: 34.8 g/dL (ref 30.0–36.0)
MCHC: 35 g/dL (ref 30.0–36.0)
MCV: 95.2 fL (ref 80.0–100.0)
MCV: 93.6 fL (ref 80.0–100.0)
MCV: 94.3 fL (ref 80.0–100.0)
Platelets: 317 10*3/uL (ref 150–400)
Platelets: 279 10*3/uL (ref 150–400)
Platelets: 294 10*3/uL (ref 150–400)
RBC: 2.94 MIL/uL — ABNORMAL LOW (ref 3.87–5.11)
RBC: 2.82 MIL/uL — ABNORMAL LOW (ref 3.87–5.11)
RBC: 2.82 MIL/uL — ABNORMAL LOW (ref 3.87–5.11)
RDW: 13 % (ref 11.5–15.5)
RDW: 13.1 % (ref 11.5–15.5)
RDW: 13 % (ref 11.5–15.5)
WBC: 9.7 10*3/uL (ref 4.0–10.5)
WBC: 8.7 10*3/uL (ref 4.0–10.5)
WBC: 9.5 10*3/uL (ref 4.0–10.5)
nRBC: 0 % (ref 0.0–0.2)
nRBC: 0 % (ref 0.0–0.2)
nRBC: 0 % (ref 0.0–0.2)

## 2018-12-05 LAB — BASIC METABOLIC PANEL
Anion gap: 10 (ref 5–15)
BUN: 25 mg/dL — ABNORMAL HIGH (ref 8–23)
CO2: 24 mmol/L (ref 22–32)
Calcium: 8.8 mg/dL — ABNORMAL LOW (ref 8.9–10.3)
Chloride: 103 mmol/L (ref 98–111)
Creatinine, Ser: 1.14 mg/dL — ABNORMAL HIGH (ref 0.44–1.00)
GFR calc Af Amer: 56 mL/min — ABNORMAL LOW (ref >60)
GFR calc non Af Amer: 48 mL/min — ABNORMAL LOW (ref >60)
Glucose, Bld: 105 mg/dL — ABNORMAL HIGH (ref 70–99)
Potassium: 3.6 mmol/L (ref 3.5–5.1)
Sodium: 137 mmol/L (ref 135–145)

## 2018-12-05 LAB — BRAIN NATRIURETIC PEPTIDE: B Natriuretic Peptide: 28.5 pg/mL (ref 0.0–100.0)

## 2018-12-05 LAB — PROTIME-INR
INR: 1.1 (ref 0.8–1.2)
Prothrombin Time: 13.7 seconds (ref 11.4–15.2)

## 2018-12-05 LAB — IRON AND TIBC
Iron: 26 ug/dL — ABNORMAL LOW (ref 28–170)
Saturation Ratios: 9 % — ABNORMAL LOW (ref 10.4–31.8)
TIBC: 277 ug/dL (ref 250–450)
UIBC: 251 ug/dL

## 2018-12-05 LAB — VITAMIN B12: Vitamin B-12: 554 pg/mL (ref 180–914)

## 2018-12-05 LAB — FERRITIN: Ferritin: 46 ng/mL (ref 11–307)

## 2018-12-05 LAB — SARS CORONAVIRUS 2 BY RT PCR (HOSPITAL ORDER, PERFORMED IN ~~LOC~~ HOSPITAL LAB): SARS Coronavirus 2: NEGATIVE

## 2018-12-05 LAB — APTT: aPTT: 30 seconds (ref 24–36)

## 2018-12-05 MED ORDER — SODIUM CHLORIDE 0.9 % IV SOLN
INTRAVENOUS | Status: DC
  Administered 2018-12-05 – 2018-12-06 (×2): via INTRAVENOUS

## 2018-12-05 MED ORDER — LORAZEPAM 2 MG/ML IJ SOLN
0.0000 mg | Freq: Two times a day (BID) | INTRAMUSCULAR | Status: DC
  Administered 2018-12-08: 1 mg via INTRAVENOUS
  Filled 2018-12-05: qty 1

## 2018-12-05 MED ORDER — ACETAMINOPHEN 650 MG RE SUPP: 650.0000 mg | Freq: Four times a day (QID) | RECTAL | Status: DC | PRN

## 2018-12-05 MED ORDER — ATORVASTATIN CALCIUM 10 MG PO TABS
20.0000 mg | ORAL_TABLET | Freq: Every day | ORAL | Status: DC
  Administered 2018-12-05 – 2018-12-10 (×5): 20 mg via ORAL
  Filled 2018-12-05 (×6): qty 2

## 2018-12-05 MED ORDER — SODIUM CHLORIDE 0.9 % IV SOLN
8.0000 mg/h | INTRAVENOUS | Status: DC
  Administered 2018-12-05 – 2018-12-06 (×4): 8 mg/h via INTRAVENOUS
  Filled 2018-12-05 (×3): qty 80

## 2018-12-05 MED ORDER — ONDANSETRON HCL 4 MG/2ML IJ SOLN: 4.0000 mg | Freq: Four times a day (QID) | INTRAMUSCULAR | Status: DC | PRN

## 2018-12-05 MED ORDER — CITALOPRAM HYDROBROMIDE 10 MG PO TABS
10.0000 mg | ORAL_TABLET | Freq: Every day | ORAL | Status: DC
  Administered 2018-12-05 – 2018-12-10 (×5): 10 mg via ORAL
  Filled 2018-12-05 (×6): qty 1

## 2018-12-05 MED ORDER — SODIUM CHLORIDE 0.9 % IV SOLN: INTRAVENOUS | Status: DC

## 2018-12-05 MED ORDER — FOLIC ACID 1 MG PO TABS
1.0000 mg | ORAL_TABLET | Freq: Every day | ORAL | Status: DC
  Administered 2018-12-05 – 2018-12-10 (×5): 1 mg via ORAL
  Filled 2018-12-05 (×6): qty 1

## 2018-12-05 MED ORDER — POLYETHYLENE GLYCOL 3350 17 G PO PACK: 17.0000 g | PACK | Freq: Every day | ORAL | Status: DC | PRN

## 2018-12-05 MED ORDER — HYDRALAZINE HCL 20 MG/ML IJ SOLN: 10.0000 mg | INTRAMUSCULAR | Status: DC | PRN

## 2018-12-05 MED ORDER — ADULT MULTIVITAMIN W/MINERALS CH
1.0000 | ORAL_TABLET | Freq: Every day | ORAL | Status: DC
  Administered 2018-12-05 – 2018-12-10 (×5): 1 via ORAL
  Filled 2018-12-05 (×6): qty 1

## 2018-12-05 MED ORDER — PANTOPRAZOLE SODIUM 40 MG IV SOLR: 40.0000 mg | Freq: Two times a day (BID) | INTRAVENOUS | Status: DC

## 2018-12-05 MED ORDER — LORAZEPAM 2 MG/ML IJ SOLN: 1.0000 mg | Freq: Four times a day (QID) | INTRAMUSCULAR | Status: DC | PRN

## 2018-12-05 MED ORDER — LORAZEPAM 1 MG PO TABS: 1.0000 mg | ORAL_TABLET | Freq: Four times a day (QID) | ORAL | Status: DC | PRN

## 2018-12-05 MED ORDER — HYDRALAZINE HCL 20 MG/ML IJ SOLN: 5.0000 mg | INTRAMUSCULAR | Status: DC | PRN

## 2018-12-05 MED ORDER — ALBUTEROL SULFATE (2.5 MG/3ML) 0.083% IN NEBU
3.0000 mL | INHALATION_SOLUTION | RESPIRATORY_TRACT | Status: DC | PRN
  Administered 2018-12-05 – 2018-12-10 (×3): 3 mL via RESPIRATORY_TRACT
  Filled 2018-12-05 (×3): qty 3

## 2018-12-05 MED ORDER — HALOPERIDOL LACTATE 5 MG/ML IJ SOLN
1.0000 mg | Freq: Four times a day (QID) | INTRAMUSCULAR | Status: AC | PRN
  Administered 2018-12-05 – 2018-12-06 (×3): 1 mg via INTRAVENOUS
  Filled 2018-12-05 (×5): qty 1

## 2018-12-05 MED ORDER — SODIUM CHLORIDE 0.9 % IV SOLN
1.0000 g | INTRAVENOUS | Status: DC
  Administered 2018-12-05 – 2018-12-08 (×4): 1 g via INTRAVENOUS
  Filled 2018-12-05 (×4): qty 10

## 2018-12-05 MED ORDER — LORAZEPAM 2 MG/ML IJ SOLN
0.0000 mg | Freq: Four times a day (QID) | INTRAMUSCULAR | Status: DC
  Administered 2018-12-05: 1 mg via INTRAVENOUS
  Administered 2018-12-06: 4 mg via INTRAVENOUS
  Filled 2018-12-05: qty 1
  Filled 2018-12-05: qty 2

## 2018-12-05 MED ORDER — VITAMIN B-1 100 MG PO TABS
100.0000 mg | ORAL_TABLET | Freq: Every day | ORAL | Status: DC
  Administered 2018-12-05 – 2018-12-10 (×5): 100 mg via ORAL
  Filled 2018-12-05 (×6): qty 1

## 2018-12-05 MED ORDER — ACETAMINOPHEN 325 MG PO TABS
650.0000 mg | ORAL_TABLET | Freq: Four times a day (QID) | ORAL | Status: DC | PRN
  Administered 2018-12-06: 650 mg via ORAL
  Filled 2018-12-05: qty 2

## 2018-12-05 MED ORDER — SENNOSIDES-DOCUSATE SODIUM 8.6-50 MG PO TABS: 2.0000 | ORAL_TABLET | Freq: Every evening | ORAL | Status: DC | PRN

## 2018-12-05 MED ORDER — THIAMINE HCL 100 MG/ML IJ SOLN
100.0000 mg | Freq: Every day | INTRAMUSCULAR | Status: DC
  Filled 2018-12-05 (×3): qty 2

## 2018-12-05 MED ORDER — METOPROLOL TARTRATE 25 MG PO TABS
25.0000 mg | ORAL_TABLET | Freq: Every day | ORAL | Status: DC
  Administered 2018-12-05 – 2018-12-09 (×4): 25 mg via ORAL
  Filled 2018-12-05 (×4): qty 1

## 2018-12-05 MED ORDER — ONDANSETRON HCL 4 MG PO TABS: 4.0000 mg | ORAL_TABLET | Freq: Four times a day (QID) | ORAL | Status: DC | PRN

## 2018-12-05 MED ORDER — METOPROLOL TARTRATE 50 MG PO TABS
50.0000 mg | ORAL_TABLET | Freq: Every day | ORAL | Status: DC
  Administered 2018-12-05 – 2018-12-10 (×5): 50 mg via ORAL
  Filled 2018-12-05 (×6): qty 1

## 2018-12-05 MED ORDER — LEVETIRACETAM 500 MG PO TABS
500.0000 mg | ORAL_TABLET | Freq: Two times a day (BID) | ORAL | Status: DC
  Administered 2018-12-05 – 2018-12-10 (×8): 500 mg via ORAL
  Filled 2018-12-05 (×10): qty 1

## 2018-12-05 NOTE — ED Notes (Signed)
Attempted report x 2 

## 2018-12-05 NOTE — Progress Notes (Signed)
Patient stated that she wants to go home if the procedure is not getting done today. MD Amin notified. Patient can have clear liquids now and be NPO after midnight.

## 2018-12-05 NOTE — Progress Notes (Signed)
Patient did not get her upper endoscopy performed today because she was not n.p.o.  It has been rescheduled for tomorrow morning around 10 AM.  Meanwhile, the patient's hemoglobin is holding steady at 9.6, and per discussion with the sitter who has been at the bedside for the past 6 hours, the patient has had no further bleeding from either end.    The patient corroborates that although following admission (and, thankfully after obtaining consent for upper endoscopy yesterday evening), she has been having confusion so her current ability to give history is unreliable.  Attempted to reach patient's daughter, Kim Blankenship, on her cell phone to discuss procedure but no answer at this time.  Continue every 12 hour IV pantoprazole.  Cleotis Nipper, M.D. Pager [phone] If no answer or after 5 PM call [phone]

## 2018-12-05 NOTE — ED Notes (Signed)
ED TO INPATIENT HANDOFF REPORT  ED Nurse Name and Phone #: Annie Main [phone]  S Name/Age/Gender Kim Blankenship 73 y.o. female Room/Bed: 028C/028C  Code Status   Code Status: Prior  Home/SNF/Other Home Patient oriented to: self, place, time and situation Is this baseline? Yes   Triage Complete: Triage complete  Chief Complaint N/V/D  Triage Note Pt bib gcems with c/o N/V/D, states she has been vomiting blood and blood in stool. States this has been going on since weds.    Allergies Allergies  Allergen Reactions  . Tape Other (See Comments)    THE PATIENT'S SKIN IS THIN AND TEARS VERY EASILY (she "picks" at it; please use coban wrap)    Level of Care/Admitting Diagnosis ED Disposition    ED Disposition Condition [facility]: [facility] [[id number]]  Level of Care: Telemetry Medical [104]  Covid Evaluation: Asymptomatic Screening Protocol (No Symptoms)  Diagnosis: GIB (gastrointestinal bleeding) [id number]  Admitting Physician: Ivor Costa [[id number]]  Attending Physician: Ivor Costa [phone]  Estimated length of stay: past midnight tomorrow  Certification:: I certify this patient will need inpatient services for at least 2 midnights  PT Class (Do Not Modify): Inpatient [101]  PT Acc Code (Do Not Modify): Private [1]       B Medical/Surgery History Past Medical History:  Diagnosis Date  . Asthma   . Atrial fibrillation ([place])   . COPD (chronic obstructive pulmonary disease) ([place])   . Hypertension   . Irregular heart rate   . PRES (posterior reversible encephalopathy syndrome)   . Stroke [facility])    Past Surgical History:  Procedure Laterality Date  . CESAREAN SECTION    . Left Foot Reconstruction    . OPEN REDUCTION INTERNAL FIXATION (ORIF) METACARPAL Right 03/10/2015   Procedure: OPEN REDUCTION INTERNAL FIXATION (ORIF) RIGHT LONG  METACARPAL FRACTURE;  Surgeon: Leanora Cover, MD;  Location: [facility];  Service: Orthopedics;   Laterality: Right;  . TONSILLECTOMY    . TOTAL HIP ARTHROPLASTY Right 02/09/2016   Procedure: TOTAL HIP ARTHROPLASTY ANTERIOR APPROACH;  Surgeon: Frederik Pear, MD;  Location: [facility];  Service: Orthopedics;  Laterality: Right;     A IV Location/Drains/Wounds Patient Lines/Drains/Airways Status   Active Line/Drains/Airways    Name:   Placement date:   Placement time:   Site:   Days:   Peripheral IV 03/13/17 Right Wrist   03/13/17    0300    Wrist   632   Peripheral IV 12/04/18 Right Antecubital   12/04/18    1715    Antecubital   1   Incision (Closed) 03/10/15 Hand Right   03/10/15    1615     1366   Incision (Closed) 02/09/16 Hip Right   02/09/16    1620     1030   Wound / Incision (Open or Dehisced) 11/07/15 Laceration Toe (Comment  which one) Left pt's been soaking toe in Epsom salt   11/07/15    0140    Toe (Comment  which one)   1124          Intake/Output Last 24 hours No intake or output data in the 24 hours ending 12/05/18 0042  Labs/Imaging Results for orders placed or performed during the hospital encounter of 12/04/18 (from the past 48 hour(s))  Comprehensive metabolic panel     Status: Abnormal   Collection Time: 12/04/18  5:34 PM  Result Value Ref Range   Sodium 136 135 - 145  mmol/L   Potassium 3.8 3.5 - 5.1 mmol/L   Chloride 102 98 - 111 mmol/L   CO2 21 (L) 22 - 32 mmol/L   Glucose, Bld 119 (H) 70 - 99 mg/dL   BUN 39 (H) 8 - 23 mg/dL   Creatinine, Ser 1.66 (H) 0.44 - 1.00 mg/dL   Calcium 9.0 8.9 - 10.3 mg/dL   Total Protein 6.0 (L) 6.5 - 8.1 g/dL   Albumin 3.7 3.5 - 5.0 g/dL   AST 24 15 - 41 U/L   ALT 23 0 - 44 U/L   Alkaline Phosphatase 56 38 - 126 U/L   Total Bilirubin 0.4 0.3 - 1.2 mg/dL   GFR calc non Af Amer 31 (L) >60 mL/min   GFR calc Af Amer 36 (L) >60 mL/min   Anion gap 13 5 - 15    Comment: Performed at [facility], [address] [place], [place] [address]  CBC     Status: Abnormal   Collection Time: 12/04/18  5:34 PM  Result Value Ref  Range   WBC 8.8 4.0 - 10.5 K/uL   RBC 3.18 (L) 3.87 - 5.11 MIL/uL   Hemoglobin 10.2 (L) 12.0 - 15.0 g/dL   HCT 29.9 (L) 36.0 - 46.0 %   MCV 94.0 80.0 - 100.0 fL   MCH 32.1 26.0 - 34.0 pg   MCHC 34.1 30.0 - 36.0 g/dL   RDW 12.7 11.5 - 15.5 %   Platelets 307 150 - 400 K/uL   nRBC 0.0 0.0 - 0.2 %    Comment: Performed at [facility], [place] [address] [place], [place] [address]  Protime-INR     Status: None   Collection Time: 12/04/18  5:34 PM  Result Value Ref Range   Prothrombin Time 13.4 11.4 - 15.2 seconds   INR 1.0 0.8 - 1.2    Comment: (NOTE) INR goal varies based on device and disease states. Performed at [facility], [place] [address] [place], [place] [address]   Lactic acid, plasma     Status: None   Collection Time: 12/04/18  5:34 PM  Result Value Ref Range   Lactic Acid, Venous 1.5 0.5 - 1.9 mmol/L    Comment: Performed at [facility] [address] [place], [place] [address]  Type and screen [place]     Status: None   Collection Time: 12/04/18  5:35 PM  Result Value Ref Range   ABO/RH(D) O NEG    Antibody Screen NEG    Sample Expiration      12/07/2018,2359 Performed at [facility], [place] [address] [place], [place] [address]   ABO/Rh     Status: None   Collection Time: 12/04/18  5:35 PM  Result Value Ref Range   ABO/RH(D)      O NEG Performed at [facility] [address], [place], [place] [address]   Urinalysis, Routine w reflex microscopic     Status: Abnormal   Collection Time: 12/04/18  8:26 PM  Result Value Ref Range   Color, Urine YELLOW YELLOW   APPearance HAZY (A) CLEAR   Specific Gravity, Urine 1.021 1.005 - 1.030   pH 5.0 5.0 - 8.0   Glucose, UA NEGATIVE NEGATIVE mg/dL   Hgb urine dipstick MODERATE (A) NEGATIVE   Bilirubin Urine NEGATIVE NEGATIVE   Ketones, ur NEGATIVE NEGATIVE mg/dL   Protein, ur NEGATIVE NEGATIVE mg/dL   Nitrite NEGATIVE NEGATIVE  Leukocytes,Ua LARGE (A) NEGATIVE    RBC / HPF 6-10 0 - 5 RBC/hpf   WBC, UA 21-50 0 - 5 WBC/hpf   Bacteria, UA RARE (A) NONE SEEN   Squamous Epithelial / LPF 0-5 0 - 5   Mucus PRESENT    Hyaline Casts, UA PRESENT     Comment: Performed at [facility], [place] [address] [place], [place] [address]  POC occult blood, ED     Status: Abnormal   Collection Time: 12/04/18  8:49 PM  Result Value Ref Range   Fecal Occult Bld POSITIVE (A) NEGATIVE   Ct Abdomen Pelvis W Contrast  Result Date: 12/04/2018 CLINICAL DATA:  Abdominal pain, anal and vaginal bleeding EXAM: CT ABDOMEN AND PELVIS WITH CONTRAST TECHNIQUE: Multidetector CT imaging of the abdomen and pelvis was performed using the standard protocol following bolus administration of intravenous contrast. CONTRAST:  41mL OMNIPAQUE IOHEXOL 300 MG/ML  SOLN COMPARISON:  None. FINDINGS: Lower chest: Lung bases demonstrate no acute consolidation or effusion. Heart size within normal limits. Hepatobiliary: No focal liver abnormality is seen. No gallstones, gallbladder wall thickening, or biliary dilatation. Pancreas: Atrophic.  No inflammatory change Spleen: Normal in size without focal abnormality. Adrenals/Urinary Tract: Adrenal glands are unremarkable. Kidneys are normal, without renal calculi, focal lesion, or hydronephrosis. Bladder is unremarkable. Stomach/Bowel: Stomach is within normal limits. Appendix appears normal. No evidence of bowel wall thickening, distention, or inflammatory changes. Vascular/Lymphatic: Moderate aortic atherosclerosis. No aneurysm. No significantly enlarged lymph nodes. Reproductive: Uterus and bilateral adnexa are unremarkable. Other: Negative for free air or free fluid. Musculoskeletal: Right hip replacement. Scoliosis of the spine. Moderate compression deformity T12 with about 40% loss of vertebral body height. Mild inferior endplate deformity with anterior wedging at L1. Mild superior endplate deformity at L4. IMPRESSION: 1. No CT evidence for acute  intra-abdominal or pelvic abnormality. 2. Multiple compression deformities of the thoracolumbar spine of uncertain age Electronically Signed   By: Donavan Foil M.D.   On: 12/04/2018 22:07    Pending Labs Unresulted Labs (From admission, onward)    Start     Ordered   12/05/18 0500  CBC  Tomorrow morning,   R     12/04/18 2223   12/05/18 [id number]  Basic metabolic panel  Tomorrow morning,   R     12/04/18 2223   12/04/18 2256  CBC  Now then every 6 hours,   R (with STAT occurrences)     12/04/18 2255   12/04/18 2238  SARS Coronavirus 2 [facility] order, Performed in [facility] hospital lab) Nasopharyngeal Nasopharyngeal Swab  (Symptomatic/High Risk of Exposure/Tier 1 Patients Labs with Precautions)  Once,   STAT    Question Answer Comment  Is this test for diagnosis or screening Screening   Symptomatic for COVID-19 as defined by CDC No   Hospitalized for COVID-19 No   Admitted to ICU for COVID-19 No   Previously tested for COVID-19 No   Resident in a congregate (group) care setting No   Employed in healthcare setting No   Pregnant No      12/04/18 2237   12/04/18 1721  Lactic acid, plasma  Now then every 2 hours,   STAT     12/04/18 1720   12/04/18 1720  Urine Culture  Add-on,   AD     12/04/18 1720   Signed and Held  SARS Coronavirus 2 [facility] order, Performed in [facility] hospital lab) Nasopharyngeal Nasal Swab  Once,   R  Comments: No isolation needed for this testing (if isolation ordered for another indication, maintain current isolation).   Question Answer Comment  Is this test for diagnosis or screening Screening   Symptomatic for COVID-19 as defined by CDC No   Hospitalized for COVID-19 No   Admitted to ICU for COVID-19 No   Previously tested for COVID-19 No   Resident in a congregate (group) care setting No   Employed in healthcare setting No   Pregnant No   Pre-procedural testing Yes      Signed and Held   Signed and Held  Brain natriuretic peptide  ONCE - STAT,    R     Signed and Held   Signed and Held  Culture, blood (Routine X 2) w Reflex to ID Panel  BLOOD CULTURE X 2,   R    Comments: Please obtain prior to antibiotic administration.    Signed and Held   Signed and Held  Protime-INR  Once,   R     Signed and Held   Signed and Held  APTT  Once,   R     Signed and Held   Signed and Held  Basic metabolic panel  Tomorrow morning,   R     Signed and Held          Vitals/Pain Today's Vitals   12/04/18 2230 12/04/18 2245 12/04/18 2300 12/04/18 2315  BP: (!) 141/94 (!) 152/90 (!) 139/103 (!) 158/84  Pulse: 93 94 99 96  Resp: 14 13 18 16   Temp:      TempSrc:      SpO2: 92% 93% 93% 99%    Isolation Precautions Airborne and Contact precautions  Medications Medications  pantoprazole (PROTONIX) injection 40 mg (40 mg Intravenous Given 12/04/18 2254)  LORazepam (ATIVAN) injection 1 mg (has no administration in time range)  pantoprazole (PROTONIX) injection 40 mg (40 mg Intravenous Given 12/04/18 1806)  sodium chloride 0.9 % bolus 500 mL (500 mLs Intravenous New Bag/Given 12/04/18 1806)  cefTRIAXone (ROCEPHIN) 1 g in sodium chloride 0.9 % 100 mL IVPB (0 g Intravenous Stopped 12/04/18 2229)  iohexol (OMNIPAQUE) 300 MG/ML solution 80 mL (80 mLs Intravenous Contrast Given 12/04/18 2140)    Mobility walks Low fall risk   Focused Assessments   R Recommendations: See Admitting Provider Note  Report given to:   Additional Notes:

## 2018-12-05 NOTE — Progress Notes (Signed)
Patient is alert and oriented to self disoriented to place. She is confused and saying that she is going to see her brother and talk to him about something. She pulled her IV out. MD Reesa Chew is notified. Awaiting for an IV to give haldol, and order for a sitter is placed. Staffing said sitter will be here at 1500. Will continue to monitor.

## 2018-12-05 NOTE — Progress Notes (Signed)
Haldol is given, patient is now in bed with her bed alarm on. Will continue to monitor.

## 2018-12-05 NOTE — Progress Notes (Signed)
PROGRESS NOTE    Kim Blankenship  [id number] DOB: 1945/06/20 DOA: 12/04/2018 PCP: Tobie Lords D, FNP   Brief Narrative:  73 year old with history of essential hypertension, hyperlipidemia, COPD, depression, atrial fibrillation not on anticoagulation, diastolic CHF, seizure disorder, alcohol use presented to the hospital with hematemesis.  She had presented in taking at least 20 tablets of ibuprofen at home weekly.  She was Hemoccult positive with about 3 g of hemoglobin drop.  GI was consulted who recommended endoscopy when able. Mild AKi improving with fluids and on Rocephin for UTI.    Assessment & Plan:   Principal Problem:   GIB (gastrointestinal bleeding) Active Problems:   COPD (chronic obstructive pulmonary disease) (HCC)   Seizure (HCC)   PAF (paroxysmal atrial fibrillation) (HCC)   EtOH dependence (HCC)   Essential hypertension   UTI (urinary tract infection)   HLD (hyperlipidemia)   GERD (gastroesophageal reflux disease)   Depression   AKI (acute kidney injury) ([place])   Stroke (cerebrum) (HCC)   Chronic diastolic CHF (congestive heart failure) (HCC)   Hematemesis/melena- upper GI bleed - Concern for gastritis versus ulcer.  Continue Protonix drip.  Avoid NSAIDs.  Hold off on anticoagulation - GI following, plans for endoscopic work-up -Supportive care.  Acute kidney injury -Baseline 0.79, admission creatinine 1.66.  Improving, today 1.14 -Supportive care, avoid nephrotoxic drugs.  Urinary tract infection with some hematuria.  -Follow-up culture. -Rocephin day 2.  Alcohol use -On as needed Ativan.  Withdrawal protocol.  Folate, thiamine and multivitamin ordered.  History of paroxysmal atrial fibrillation -Not on any anticoagulation.  Has elevated chadVASCs 5.  Unable to give due to GI bleeding anyways at this time, can discuss at outpatient.  Diastolic CHF, chronic with preserved ejection fraction 55%, grade 1 - Appears to be euvolemic.  No evidence of  fluid overload.  Holding lisinopril due to AKI.  History of seizure disorder -Seizure precaution.  Continue home [place].  Essential hypertension - Continue metoprolol.  Holding Aldactone due to mild AKI.  GERD -PPI  COPD -PRN albuterol  Depression -On Celexa  History of stroke -Aspirin on hold due to GI bleeding  Hyperlipidemia -Statin  DVT prophylaxis: SCDs Code Status: Full code Family Communication: Spoke with her daughter Cecille Rubin Disposition Plan: To be determined, needs endoscopic evaluation  Consultants:  GI  Procedures:   None  Antimicrobials:   None   Subjective: Still having some dark stool but no more hematemesis.  Review of Systems Otherwise negative except as per HPI, including: General: Denies fever, chills, night sweats or unintended weight loss. Resp: Denies cough, wheezing, shortness of breath. Cardiac: Denies chest pain, palpitations, orthopnea, paroxysmal nocturnal dyspnea. GI: Denies abdominal pain, nausea, vomiting, diarrhea or constipation GU: Denies dysuria, frequency, hesitancy or incontinence MS: Denies muscle aches, joint pain or swelling Neuro: Denies headache, neurologic deficits (focal weakness, numbness, tingling), abnormal gait Psych: Denies anxiety, depression, SI/HI/AVH Skin: Denies new rashes or lesions ID: Denies sick contacts, exotic exposures, travel  Objective: Vitals:   12/05/18 0245 12/05/18 0300 12/05/18 0352 12/05/18 0822  BP: (!) 147/79 (!) 157/78 140/74 (!) 128/55  Pulse: 92 91 93 91  Resp: 11 12 15 20   Temp:   98.3 F (36.8 C) 98.5 F (36.9 C)  TempSrc:   Oral Oral  SpO2: 93% 93% 94% 94%  Weight:   66 kg   Height:   5\' 4"  (1.626 m)     Intake/Output Summary (Last 24 hours) at 12/05/2018 0954 Last data filed at 12/05/2018 0600 Gross  per 24 hour  Intake 7.34 ml  Output 400 ml  Net -392.66 ml   Filed Weights   12/05/18 0352  Weight: 66 kg    Examination:  General exam: Appears calm and comfortable   Respiratory system: Clear to auscultation. Respiratory effort normal. Cardiovascular system: S1 & S2 heard, RRR. No JVD, murmurs, rubs, gallops or clicks. No pedal edema. Gastrointestinal system: Abdomen is nondistended, soft and nontender. No organomegaly or masses felt. Normal bowel sounds heard. Central nervous system: Alert and oriented. No focal neurological deficits. Extremities: Symmetric 5 x 5 power. Skin: No rashes, lesions or ulcers Psychiatry: Judgement and insight appear normal. Mood & affect appropriate.     Data Reviewed:   CBC: Recent Labs  Lab 12/04/18 1734 12/05/18 0146  WBC 8.8 9.7  HGB 10.2* 9.6*  HCT 29.9* 28.0*  MCV 94.0 95.2  PLT 307 [id number]   Basic Metabolic Panel: Recent Labs  Lab 12/04/18 1734 12/05/18 0728  NA 136 137  K 3.8 3.6  CL 102 103  CO2 21* 24  GLUCOSE 119* 105*  BUN 39* 25*  CREATININE 1.66* 1.14*  CALCIUM 9.0 8.8*   GFR: Estimated Creatinine Clearance: 42.3 mL/min (A) (by C-G formula based on SCr of 1.14 mg/dL (H)). Liver Function Tests: Recent Labs  Lab 12/04/18 1734  AST 24  ALT 23  ALKPHOS 56  BILITOT 0.4  PROT 6.0*  ALBUMIN 3.7   No results for input(s): LIPASE, AMYLASE in the last 168 hours. No results for input(s): AMMONIA in the last 168 hours. Coagulation Profile: Recent Labs  Lab 12/04/18 1734 12/05/18 0728  INR 1.0 1.1   Cardiac Enzymes: No results for input(s): CKTOTAL, CKMB, CKMBINDEX, TROPONINI in the last 168 hours. BNP (last 3 results) No results for input(s): PROBNP in the last 8760 hours. HbA1C: No results for input(s): HGBA1C in the last 72 hours. CBG: No results for input(s): GLUCAP in the last 168 hours. Lipid Profile: No results for input(s): CHOL, HDL, LDLCALC, TRIG, CHOLHDL, LDLDIRECT in the last 72 hours. Thyroid Function Tests: No results for input(s): TSH, T4TOTAL, FREET4, T3FREE, THYROIDAB in the last 72 hours. Anemia Panel: No results for input(s): VITAMINB12, FOLATE, FERRITIN, TIBC,  IRON, RETICCTPCT in the last 72 hours. Sepsis Labs: Recent Labs  Lab 12/04/18 1734  LATICACIDVEN 1.5    Recent Results (from the past 240 hour(s))  SARS Coronavirus 2 [facility] order, Performed in [facility] hospital lab) Nasopharyngeal Nasopharyngeal Swab     Status: None   Collection Time: 12/04/18 10:59 PM   Specimen: Nasopharyngeal Swab  Result Value Ref Range Status   SARS Coronavirus 2 NEGATIVE NEGATIVE Final    Comment: (NOTE) If result is NEGATIVE SARS-CoV-2 target nucleic acids are NOT DETECTED. The SARS-CoV-2 RNA is generally detectable in upper and lower  respiratory specimens during the acute phase of infection. The lowest  concentration of SARS-CoV-2 viral copies this assay can detect is 250  copies / mL. A negative result does not preclude SARS-CoV-2 infection  and should not be used as the sole basis for treatment or other  patient management decisions.  A negative result may occur with  improper specimen collection / handling, submission of specimen other  than nasopharyngeal swab, presence of viral mutation(s) within the  areas targeted by this assay, and inadequate number of viral copies  (<250 copies / mL). A negative result must be combined with clinical  observations, patient history, and epidemiological information. If result is POSITIVE SARS-CoV-2 target nucleic acids are DETECTED.  The SARS-CoV-2 RNA is generally detectable in upper and lower  respiratory specimens dur ing the acute phase of infection.  Positive  results are indicative of active infection with SARS-CoV-2.  Clinical  correlation with patient history and other diagnostic information is  necessary to determine patient infection status.  Positive results do  not rule out bacterial infection or co-infection with other viruses. If result is PRESUMPTIVE POSTIVE SARS-CoV-2 nucleic acids MAY BE PRESENT.   A presumptive positive result was obtained on the submitted specimen  and confirmed on  repeat testing.  While 2019 novel coronavirus  (SARS-CoV-2) nucleic acids may be present in the submitted sample  additional confirmatory testing may be necessary for epidemiological  and / or clinical management purposes  to differentiate between  SARS-CoV-2 and other Sarbecovirus currently known to infect humans.  If clinically indicated additional testing with an alternate test  methodology [phone]) is advised. The SARS-CoV-2 RNA is generally  detectable in upper and lower respiratory sp ecimens during the acute  phase of infection. The expected result is Negative. Fact Sheet for Patients:  StrictlyIdeas.no Fact Sheet for Healthcare Providers: BankingDealers.co.za This test is not yet approved or cleared by the [place] FDA and has been authorized for detection and/or diagnosis of SARS-CoV-2 by FDA under an Emergency Use Authorization (EUA).  This EUA will remain in effect (meaning this test can be used) for the duration of the COVID-19 declaration under Section 564(b)(1) of the Act, 21 U.S.C. section 360bbb-3(b)(1), unless the authorization is terminated or revoked sooner. Performed at [facility], [place] [address], [place], [place] [address]          Radiology Studies: Ct Abdomen Pelvis W Contrast  Result Date: 12/04/2018 CLINICAL DATA:  Abdominal pain, anal and vaginal bleeding EXAM: CT ABDOMEN AND PELVIS WITH CONTRAST TECHNIQUE: Multidetector CT imaging of the abdomen and pelvis was performed using the standard protocol following bolus administration of intravenous contrast. CONTRAST:  49mL OMNIPAQUE IOHEXOL 300 MG/ML  SOLN COMPARISON:  None. FINDINGS: Lower chest: Lung bases demonstrate no acute consolidation or effusion. Heart size within normal limits. Hepatobiliary: No focal liver abnormality is seen. No gallstones, gallbladder wall thickening, or biliary dilatation. Pancreas: Atrophic.  No inflammatory change Spleen:  Normal in size without focal abnormality. Adrenals/Urinary Tract: Adrenal glands are unremarkable. Kidneys are normal, without renal calculi, focal lesion, or hydronephrosis. Bladder is unremarkable. Stomach/Bowel: Stomach is within normal limits. Appendix appears normal. No evidence of bowel wall thickening, distention, or inflammatory changes. Vascular/Lymphatic: Moderate aortic atherosclerosis. No aneurysm. No significantly enlarged lymph nodes. Reproductive: Uterus and bilateral adnexa are unremarkable. Other: Negative for free air or free fluid. Musculoskeletal: Right hip replacement. Scoliosis of the spine. Moderate compression deformity T12 with about 40% loss of vertebral body height. Mild inferior endplate deformity with anterior wedging at L1. Mild superior endplate deformity at L4. IMPRESSION: 1. No CT evidence for acute intra-abdominal or pelvic abnormality. 2. Multiple compression deformities of the thoracolumbar spine of uncertain age Electronically Signed   By: Donavan Foil M.D.   On: 12/04/2018 22:07        Scheduled Meds:  atorvastatin  20 mg Oral Daily   citalopram  10 mg Oral Daily   folic acid  1 mg Oral Daily   levETIRAcetam  500 mg Oral BID   LORazepam  0-4 mg Intravenous Q6H   Followed by   Derrill Memo ON 12/07/2018] LORazepam  0-4 mg Intravenous Q12H   metoprolol tartrate  25 mg Oral QHS  metoprolol tartrate  50 mg Oral Daily   multivitamin with minerals  1 tablet Oral Daily   [START ON 12/08/2018] pantoprazole  40 mg Intravenous Q12H   thiamine  100 mg Oral Daily   Or   thiamine  100 mg Intravenous Daily   Continuous Infusions:  sodium chloride     sodium chloride 20 mL/hr at 12/05/18 0902   cefTRIAXone (ROCEPHIN)  IV     pantoprozole (PROTONIX) infusion 8 mg/hr (12/05/18 0542)     LOS: 1 day   Time spent= 35 mins    Patrik Turnbaugh Arsenio Loader, MD [facility]  If 7PM-7AM, please contact night-coverage [email or web address] 12/05/2018, 9:54 AM

## 2018-12-05 NOTE — ED Notes (Signed)
RN will collect labs. 

## 2018-12-06 ENCOUNTER — Encounter (HOSPITAL_COMMUNITY)
Admission: EM | Disposition: A | Discharge status: Home / Self Care | Payer: Self-pay | Source: Home / Self Care | Attending: Internal Medicine

## 2018-12-06 DIAGNOSIS — R451 Restlessness and agitation: Secondary | ICD-10-CM

## 2018-12-06 LAB — CBC
HCT: 22.3 % — ABNORMAL LOW (ref 36.0–46.0)
Hemoglobin: 7.8 g/dL — ABNORMAL LOW (ref 12.0–15.0)
MCH: 33.1 pg (ref 26.0–34.0)
MCHC: 35 g/dL (ref 30.0–36.0)
MCV: 94.5 fL (ref 80.0–100.0)
Platelets: 213 10*3/uL (ref 150–400)
RBC: 2.36 MIL/uL — ABNORMAL LOW (ref 3.87–5.11)
RDW: 13.1 % (ref 11.5–15.5)
WBC: 6.4 10*3/uL (ref 4.0–10.5)
nRBC: 0 % (ref 0.0–0.2)

## 2018-12-06 LAB — BASIC METABOLIC PANEL
Anion gap: 10 (ref 5–15)
BUN: 11 mg/dL (ref 8–23)
CO2: 23 mmol/L (ref 22–32)
Calcium: 8.5 mg/dL — ABNORMAL LOW (ref 8.9–10.3)
Chloride: 106 mmol/L (ref 98–111)
Creatinine, Ser: 0.83 mg/dL (ref 0.44–1.00)
GFR calc Af Amer: >60 mL/min (ref >60)
GFR calc non Af Amer: >60 mL/min (ref >60)
Glucose, Bld: 105 mg/dL — ABNORMAL HIGH (ref 70–99)
Potassium: 3.2 mmol/L — ABNORMAL LOW (ref 3.5–5.1)
Sodium: 139 mmol/L (ref 135–145)

## 2018-12-06 LAB — URINE CULTURE: Culture: 30000 — AB

## 2018-12-06 LAB — MAGNESIUM: Magnesium: 1.7 mg/dL (ref 1.7–2.4)

## 2018-12-06 SURGERY — ESOPHAGOGASTRODUODENOSCOPY (EGD) WITH PROPOFOL
Anesthesia: Monitor Anesthesia Care

## 2018-12-06 MED ORDER — PANTOPRAZOLE SODIUM 40 MG IV SOLR
40.0000 mg | Freq: Two times a day (BID) | INTRAVENOUS | Status: DC
  Administered 2018-12-06 – 2018-12-10 (×9): 40 mg via INTRAVENOUS
  Filled 2018-12-06 (×9): qty 40

## 2018-12-06 MED ORDER — HALOPERIDOL LACTATE 5 MG/ML IJ SOLN
2.0000 mg | Freq: Four times a day (QID) | INTRAMUSCULAR | Status: AC | PRN
  Administered 2018-12-06 – 2018-12-07 (×2): 2 mg via INTRAVENOUS
  Filled 2018-12-06 (×4): qty 1

## 2018-12-06 MED ORDER — PANTOPRAZOLE SODIUM 40 MG IV SOLR: 40.0000 mg | Freq: Two times a day (BID) | INTRAVENOUS | Status: DC

## 2018-12-06 MED ORDER — SUCRALFATE 1 GM/10ML PO SUSP
1.0000 g | Freq: Three times a day (TID) | ORAL | Status: DC
  Administered 2018-12-06 – 2018-12-10 (×10): 1 g via ORAL
  Filled 2018-12-06 (×10): qty 10

## 2018-12-06 MED ORDER — LEVETIRACETAM IN NACL 500 MG/100ML IV SOLN
500.0000 mg | Freq: Once | INTRAVENOUS | Status: AC
  Administered 2018-12-06: 500 mg via INTRAVENOUS
  Filled 2018-12-06: qty 100

## 2018-12-06 MED ORDER — FERROUS SULFATE 325 (65 FE) MG PO TABS
325.0000 mg | ORAL_TABLET | Freq: Two times a day (BID) | ORAL | Status: DC
  Administered 2018-12-07 – 2018-12-10 (×5): 325 mg via ORAL
  Filled 2018-12-06 (×5): qty 1

## 2018-12-06 MED ORDER — HALOPERIDOL LACTATE 5 MG/ML IJ SOLN
2.0000 mg | Freq: Once | INTRAMUSCULAR | Status: AC
  Administered 2018-12-06: 2 mg via INTRAVENOUS

## 2018-12-06 MED ORDER — METOPROLOL TARTRATE 5 MG/5ML IV SOLN: 5.0000 mg | INTRAVENOUS | Status: DC | PRN

## 2018-12-06 NOTE — Progress Notes (Signed)
Patient removed telemetry box. Staff called to place tele on standby due to patient behavior.  Patient removed IV from right wrist. Haldol 2 mg given IM in right buttock per verbal order of provider.  Patient placed in soft wrist restraint per order.

## 2018-12-06 NOTE — Progress Notes (Signed)
Patient is awake now trying to jump out of bed, she is very agitated and is being verbally abusive to staff. MD notified, haldol mg given. Patient is very irritable is not allowing staff to get an EKG at this moment, MD notified. Patient is in restraint and is being checked q2h. She now has a Actuary at bedside. Will continue to monitor.

## 2018-12-06 NOTE — Progress Notes (Signed)
Called by bedside RN with concerns with patient becoming increasingly agitated. According to RN, pt became slightly agitated on dayshift 12/05/2018 and was given IV Ativan. This apparently made her symptoms worse so she was subsequently given haldol IV. This appeared to be helpful in calming pt for most of the day but now patient has become violent against staff and daughter who was at the bedside. Pt has kicked everyone out of the room and is refusing to speak with anyone at the moment.   Alcohol use/Agitation Attempted to assess and talk with patient and calm her but was told to "get the hell out". She refuses to answer any questions at this time.  - Pt to remain 1:1 with sitter - Given Haldol 2mg  IV - Security called -4 point restraints for now -Discussion had with daughter about having pt IVC.  Lovey Newcomer, NP [facility] [phone] [phone]

## 2018-12-06 NOTE — Progress Notes (Signed)
Patient increase level of agitation. Staff attempted to enter room to place restraint. Patient became verbally aggressive. Security attempted to talk with patient per her request. Patient became verbally aggressive toward security.  Staff paged provider. Provider responded to page. Ordered Haldol 2mg  IV or IM if unable to get IV access.

## 2018-12-06 NOTE — Consult Note (Signed)
Patient unable to participate in psychiatric assessment due being combative, drowsy and lethargic. Staff nurse is aware. Please re-consult psychiatric service when patient is alert and awake.   Corena Pilgrim, MD Attending psychiatrist

## 2018-12-06 NOTE — Progress Notes (Signed)
Paged provider regarding patient increased agitation and eratic behavior.  Patient verbally aggressive toward daughter. States the room has dirty newspaper and cigarette butts all over and she has to get out of this [place]. Staff attempted to redirect her.Patient states she wants security present. Security called  Provider responded to page. Ordered restraing

## 2018-12-06 NOTE — Progress Notes (Signed)
Awaiting IV placement to restart patient IV protonix

## 2018-12-06 NOTE — Progress Notes (Signed)
PROGRESS NOTE    Kim Blankenship  [id number] DOB: 29-Feb-1946 DOA: 12/04/2018 PCP: Tobie Lords D, FNP   Brief Narrative:  73 year old with history of essential hypertension, hyperlipidemia, COPD, depression, atrial fibrillation not on anticoagulation, diastolic CHF, seizure disorder, alcohol use presented to the hospital with hematemesis.  She had presented in taking at least 20 tablets of ibuprofen at home weekly.  She was Hemoccult positive with about 3 g of hemoglobin drop.  GI was consulted who recommended endoscopy when able but at the moment patient is quite agitated. Mild AKi improving with fluids and on Rocephin for UTI.    Assessment & Plan:   Principal Problem:   GIB (gastrointestinal bleeding) Active Problems:   COPD (chronic obstructive pulmonary disease) (HCC)   Seizure (HCC)   PAF (paroxysmal atrial fibrillation) (HCC)   EtOH dependence (HCC)   Essential hypertension   UTI (urinary tract infection)   HLD (hyperlipidemia)   GERD (gastroesophageal reflux disease)   Depression   AKI (acute kidney injury) ([place])   Stroke (cerebrum) (HCC)   Chronic diastolic CHF (congestive heart failure) (HCC)   Agitation and delirium - We will hold off on giving further Ativan.  Does not actively show any signs of alcohol withdrawal.  We will continue folate, thiamine and multivitamin -Haldol has been ordered.  When patient allows will get bladder scan to ensure she is not having urinary retention especially in setting of UTI worsening her delirium and agitation - Consulted psych.  Perhaps addition of low-dose Seroquel or risperidone may help? - Sitter has been ordered as patient appears to be aggressive towards the staff and concern for harming herself by pulling on IV lines and tubing.  Hematemesis/melena- upper GI bleed - Appears to have subsided.  We will transition from Protonix drip to twice daily.  sucralfate - Spoke with Dr. Cristina Gong this morning-given patient's agitation  and being hemodynamically stable-would like to hold off on endoscopy today. -Supportive care.  Acute kidney injury, resolved -Baseline 0.79, admission creatinine 1.66.  Down to 0.8 -Supportive care, avoid nephrotoxic drugs.  Urinary tract infection with some hematuria.  -Follow-up culture. -Rocephin day 3.  Abnormal EKG -repeat EKG ordered if patient allows, No chest pain.   History of paroxysmal atrial fibrillation -Not on any anticoagulation.  Has elevated chadVASCs 5.  Unable to give due to GI bleeding anyways at this time, can discuss at outpatient.  Diastolic CHF, chronic with preserved ejection fraction 55%, grade 1 - Appears to be euvolemic.  No evidence of fluid overload.  Lisinopril on hold  History of seizure disorder -Seizure precaution.  Continue home [place].  Essential hypertension - Continue metoprolol.  Holding Aldactone due to mild AKI.  GERD -PPI  COPD -PRN albuterol  Depression -On Celexa  History of stroke -Aspirin on hold due to GI bleeding  Hyperlipidemia -Statin  DVT prophylaxis: SCDs Code Status: Full code Family Communication: Spoke with the patient's daughter regarding her agitation and delirium.  She is also aware that we are holding off on endoscopic evaluation at this time Disposition Plan: Monitor for agitation in the meantime monitor for bleeding as well  Consultants:  GI  Procedures:   None  Antimicrobials:   None   Subjective: Quite agitated over last 24 hrs. aggressive towards staff members.  No further bleeding.   Review of Systems Otherwise negative except as per HPI, including: General = no fevers, chills, dizziness, malaise, fatigue HEENT/EYES = negative for pain, redness, loss of vision, double vision, blurred vision, loss of  hearing, sore throat, hoarseness, dysphagia Cardiovascular= negative for chest pain, palpitation, murmurs, lower extremity swelling Respiratory/lungs= negative for shortness of breath, cough,  hemoptysis, wheezing, mucus production Gastrointestinal= negative for nausea, vomiting,, abdominal pain, melena, hematemesis Genitourinary= negative for Dysuria, Hematuria, Change in Urinary Frequency MSK = Negative for arthralgia, myalgias, Back Pain, Joint swelling  Neurology= Negative for headache, seizures, numbness, tingling  Psychiatry= Negative  depression, suicidal and homocidal ideation Allergy/Immunology= Medication/Food allergy as listed  Skin= Negative for Rash, lesions, ulcers, itching   Objective: Vitals:   12/05/18 2153 12/05/18 2353 12/06/18 0350 12/06/18 0643  BP: 118/63 (!) 144/72 127/77 124/70  Pulse: 90 82 89 83  Resp:  20 18 20   Temp:  98.9 F (37.2 C) 98.4 F (36.9 C)   TempSrc:  Oral Oral   SpO2: 93% 95% 92%   Weight:   64.9 kg   Height:        Intake/Output Summary (Last 24 hours) at 12/06/2018 0959 Last data filed at 12/06/2018 0500 Gross per 24 hour  Intake 742.49 ml  Output 600 ml  Net 142.49 ml   Filed Weights   12/05/18 0352 12/06/18 0350  Weight: 66 kg 64.9 kg    Examination: Constitutional: NAD, agitated.  Eyes: PERRL, lids and conjunctivae normal ENMT: Mucous membranes are moist. Posterior pharynx clear of any exudate or lesions.Normal dentition.  Neck: normal, supple, no masses, no thyromegaly Respiratory: clear to auscultation bilaterally, no wheezing, no crackles. Normal respiratory effort. No accessory muscle use.  Cardiovascular: Regular rate and rhythm, no murmurs / rubs / gallops. No extremity edema. 2+ pedal pulses. No carotid bruits.  Abdomen: no tenderness, no masses palpated. No hepatosplenomegaly. Bowel sounds positive.  Musculoskeletal: no clubbing / cyanosis. No joint deformity upper and lower extremities. Good ROM, no contractures. Normal muscle tone.  Skin: no rashes, lesions, ulcers. No induration Neurologic: CN 2-12 grossly intact. Sensation intact, DTR normal. Strength 5/5 in all 4.  Psychiatric: Poorjudgment and insight.  Alert and oriented x 1. Agitated   Data Reviewed:   CBC: Recent Labs  Lab 12/04/18 1734 12/05/18 0146 12/05/18 1040 12/05/18 1644 12/06/18 0814  WBC 8.8 9.7 8.7 9.5 6.4  HGB 10.2* 9.6* 9.2* 9.3* 7.8*  HCT 29.9* 28.0* 26.4* 26.6* 22.3*  MCV 94.0 95.2 93.6 94.3 94.5  PLT 307 317 279 294 [id number]   Basic Metabolic Panel: Recent Labs  Lab 12/04/18 1734 12/05/18 0728 12/06/18 0814  NA 136 137 139  K 3.8 3.6 3.2*  CL 102 103 106  CO2 21* 24 23  GLUCOSE 119* 105* 105*  BUN 39* 25* 11  CREATININE 1.66* 1.14* 0.83  CALCIUM 9.0 8.8* 8.5*  MG  --   --  1.7   GFR: Estimated Creatinine Clearance: 53.7 mL/min (by C-G formula based on SCr of 0.83 mg/dL). Liver Function Tests: Recent Labs  Lab 12/04/18 1734  AST 24  ALT 23  ALKPHOS 56  BILITOT 0.4  PROT 6.0*  ALBUMIN 3.7   No results for input(s): LIPASE, AMYLASE in the last 168 hours. No results for input(s): AMMONIA in the last 168 hours. Coagulation Profile: Recent Labs  Lab 12/04/18 1734 12/05/18 0728  INR 1.0 1.1   Cardiac Enzymes: No results for input(s): CKTOTAL, CKMB, CKMBINDEX, TROPONINI in the last 168 hours. BNP (last 3 results) No results for input(s): PROBNP in the last 8760 hours. HbA1C: No results for input(s): HGBA1C in the last 72 hours. CBG: No results for input(s): GLUCAP in the last 168 hours. Lipid Profile: No  results for input(s): CHOL, HDL, LDLCALC, TRIG, CHOLHDL, LDLDIRECT in the last 72 hours. Thyroid Function Tests: No results for input(s): TSH, T4TOTAL, FREET4, T3FREE, THYROIDAB in the last 72 hours. Anemia Panel: Recent Labs    12/05/18 1040  VITAMINB12 554  FERRITIN 46  TIBC 277  IRON 26*   Sepsis Labs: Recent Labs  Lab 12/04/18 1734  LATICACIDVEN 1.5    Recent Results (from the past 240 hour(s))  Urine Culture     Status: Abnormal   Collection Time: 12/04/18  8:07 PM   Specimen: Urine, Random  Result Value Ref Range Status   Specimen Description URINE, RANDOM  Final    Special Requests   Final    NONE Performed at [facility], [place] [address], [place], [place] [address]    Culture (A)  Final    30,000 COLONIES/mL MULTIPLE SPECIES PRESENT, SUGGEST RECOLLECTION   Report Status 12/06/2018 FINAL  Final  SARS Coronavirus 2 [facility] order, Performed in [facility] hospital lab) Nasopharyngeal Nasopharyngeal Swab     Status: None   Collection Time: 12/04/18 10:59 PM   Specimen: Nasopharyngeal Swab  Result Value Ref Range Status   SARS Coronavirus 2 NEGATIVE NEGATIVE Final    Comment: (NOTE) If result is NEGATIVE SARS-CoV-2 target nucleic acids are NOT DETECTED. The SARS-CoV-2 RNA is generally detectable in upper and lower  respiratory specimens during the acute phase of infection. The lowest  concentration of SARS-CoV-2 viral copies this assay can detect is 250  copies / mL. A negative result does not preclude SARS-CoV-2 infection  and should not be used as the sole basis for treatment or other  patient management decisions.  A negative result may occur with  improper specimen collection / handling, submission of specimen other  than nasopharyngeal swab, presence of viral mutation(s) within the  areas targeted by this assay, and inadequate number of viral copies  (<250 copies / mL). A negative result must be combined with clinical  observations, patient history, and epidemiological information. If result is POSITIVE SARS-CoV-2 target nucleic acids are DETECTED. The SARS-CoV-2 RNA is generally detectable in upper and lower  respiratory specimens dur ing the acute phase of infection.  Positive  results are indicative of active infection with SARS-CoV-2.  Clinical  correlation with patient history and other diagnostic information is  necessary to determine patient infection status.  Positive results do  not rule out bacterial infection or co-infection with other viruses. If result is PRESUMPTIVE POSTIVE SARS-CoV-2 nucleic acids MAY BE PRESENT.   A  presumptive positive result was obtained on the submitted specimen  and confirmed on repeat testing.  While 2019 novel coronavirus  (SARS-CoV-2) nucleic acids may be present in the submitted sample  additional confirmatory testing may be necessary for epidemiological  and / or clinical management purposes  to differentiate between  SARS-CoV-2 and other Sarbecovirus currently known to infect humans.  If clinically indicated additional testing with an alternate test  methodology [phone]) is advised. The SARS-CoV-2 RNA is generally  detectable in upper and lower respiratory sp ecimens during the acute  phase of infection. The expected result is Negative. Fact Sheet for Patients:  StrictlyIdeas.no Fact Sheet for Healthcare Providers: BankingDealers.co.za This test is not yet approved or cleared by the [place] FDA and has been authorized for detection and/or diagnosis of SARS-CoV-2 by FDA under an Emergency Use Authorization (EUA).  This EUA will remain in effect (meaning this test can be used) for the duration of the COVID-19  declaration under Section 564(b)(1) of the Act, 21 U.S.C. section 360bbb-3(b)(1), unless the authorization is terminated or revoked sooner. Performed at [facility], [place] [address], [place], [place] [address]          Radiology Studies: Ct Abdomen Pelvis W Contrast  Result Date: 12/04/2018 CLINICAL DATA:  Abdominal pain, anal and vaginal bleeding EXAM: CT ABDOMEN AND PELVIS WITH CONTRAST TECHNIQUE: Multidetector CT imaging of the abdomen and pelvis was performed using the standard protocol following bolus administration of intravenous contrast. CONTRAST:  30mL OMNIPAQUE IOHEXOL 300 MG/ML  SOLN COMPARISON:  None. FINDINGS: Lower chest: Lung bases demonstrate no acute consolidation or effusion. Heart size within normal limits. Hepatobiliary: No focal liver abnormality is seen. No gallstones, gallbladder wall  thickening, or biliary dilatation. Pancreas: Atrophic.  No inflammatory change Spleen: Normal in size without focal abnormality. Adrenals/Urinary Tract: Adrenal glands are unremarkable. Kidneys are normal, without renal calculi, focal lesion, or hydronephrosis. Bladder is unremarkable. Stomach/Bowel: Stomach is within normal limits. Appendix appears normal. No evidence of bowel wall thickening, distention, or inflammatory changes. Vascular/Lymphatic: Moderate aortic atherosclerosis. No aneurysm. No significantly enlarged lymph nodes. Reproductive: Uterus and bilateral adnexa are unremarkable. Other: Negative for free air or free fluid. Musculoskeletal: Right hip replacement. Scoliosis of the spine. Moderate compression deformity T12 with about 40% loss of vertebral body height. Mild inferior endplate deformity with anterior wedging at L1. Mild superior endplate deformity at L4. IMPRESSION: 1. No CT evidence for acute intra-abdominal or pelvic abnormality. 2. Multiple compression deformities of the thoracolumbar spine of uncertain age Electronically Signed   By: Donavan Foil M.D.   On: 12/04/2018 22:07        Scheduled Meds: . atorvastatin  20 mg Oral Daily  . citalopram  10 mg Oral Daily  . [START ON 12/07/2018] ferrous sulfate  325 mg Oral BID WC  . folic acid  1 mg Oral Daily  . levETIRAcetam  500 mg Oral BID  . LORazepam  0-4 mg Intravenous Q6H   Followed by  . [START ON 12/07/2018] LORazepam  0-4 mg Intravenous Q12H  . metoprolol tartrate  25 mg Oral QHS  . metoprolol tartrate  50 mg Oral Daily  . multivitamin with minerals  1 tablet Oral Daily  . [START ON 12/08/2018] pantoprazole  40 mg Intravenous Q12H  . pantoprazole (PROTONIX) IV  40 mg Intravenous Q12H  . sucralfate  1 g Oral TID WC & HS  . thiamine  100 mg Oral Daily   Or  . thiamine  100 mg Intravenous Daily   Continuous Infusions: . sodium chloride    . sodium chloride 20 mL/hr at 12/06/18 0559  . cefTRIAXone (ROCEPHIN)  IV  Stopped (12/06/18 0014)     LOS: 2 days   Time spent= 35 mins    Thais Silberstein Arsenio Loader, MD [facility]  If 7PM-7AM, please contact night-coverage [email or web address] 12/06/2018, 9:59 AM

## 2018-12-06 NOTE — Progress Notes (Addendum)
RN called RR Nurse after patient became increasingly agitated again once Haldol wore off.  RR nurse at bedside and encouraging staff to give patient CIWA ativan.

## 2018-12-06 NOTE — Progress Notes (Signed)
Patient is without overt bleeding, per discussion with sitter.  Hemoglobin has dropped from 9.3 to 7.8 over the past 12 hours, however, this has been associated with a drop in both BUN and creatinine, suggestive of improved hydration and equilibration of the patient's hemoglobin.  Therefore, I do not think the patient is actively bleeding.  The patient's main clinical problem has been agitation requiring sedation and four-point restraints.  Consequently, a decision was made not to pursue endoscopic evaluation today, because it was felt that attempting it in an uncooperative patient would cause undue risk, relative to benefit since it does not appear the patient is actively bleeding.  Recommendations:  1.  I discussed the case with the patient's attending physician, Dr. Reesa Chew, who is agreeable with holding off on endoscopy  2.  Would empirically add sucralfate while the patient is in-house, as adjunctive therapy for any ulcer disease that might be present  3.  Until the patient is reliably taking her p.o. medications (which he recently has been refusing), would continue twice daily intravenous PPI therapy  4.  The further we get out from the patient's initial bleeding episode, the less both a diagnostic and therapeutic benefit of endoscopic evaluation there will be.  Given this patient's agitation, I do not think endoscopy on this hospitalization will be prudent or, fortunately, necessary.    5.  Accordingly, I would recommend empiric medical therapy with twice daily PPI and 4 times daily sucralfate until discharge.  Thereafter, the patient should probably remain empirically on once daily PPI therapy forever, since the exact cause and source of her GI bleed is unknown, although it is assumed to be due to ulcer disease related to excessive use of aspirin and ibuprofen.  6.  It would be very important for this patient to avoid NSAID'S in the future and she needs to be instructed in this if she is going  back to her own home.  7.  It would be ideal to avoid aspirin indefinitely in this patient as well, but if it is felt to be strongly clinically indicated, restarting low-dose aspirin in a week or 2 could be done with reasonable safety, as long as the patient was receiving concurrent PPI prophylaxis.  8.  I will sign off but would be happy to see the patient again at your request if further input is needed.  Cleotis Nipper, M.D. Pager [phone] If no answer or after 5 PM call [phone]

## 2018-12-06 NOTE — Significant Event (Signed)
Rapid Response Event Note  Overview:Called as a second set of eyes because pt is agitated with an increased CIWA. Pt on CIWA protocol and was given ativan yesterday on day shift and it was thought that the ativan made her even more confused. Due to this, staff have been treating pt's agitation with Haldol t/o night shift. Pt has received a total of 3mg  haldol tonight and, though it initially helps, it is very short-lived.  Time Called: 0615 Arrival Time: 0620 Event Type: Other (Comment)(agitation/CIWA)  Initial Focused Assessment: Pt laying in bed, alert x 1, very agitated, being verbally abusive to staff. Pt will follow commands at her own discretion and will move all extremities. CIWA-31, BP-124/70, HR-86(SR), RR-20 Interventions: 4mg  ativan per CIWA protocol Plan of Care (if not transferred): CIWA/ativan q6h per order. Monitor pt. Call RRT if further assistance needed. Event Summary: Name of Physician Notified: Kennon Holter, NP at (PTA RRT)    at          [place], Carren Rang

## 2018-12-06 NOTE — Progress Notes (Signed)
RN paged provider regarding increasing patient agitation.

## 2018-12-06 NOTE — Progress Notes (Signed)
Patient was able to void 374ml by herself, per NT  Kim. She had an unmeasured urine on the bed also. IN and out cath was not done because of that. MD aware.

## 2018-12-06 NOTE — Progress Notes (Signed)
Patient is refusing medications by mouth, MD aware. Patient is refusing EKG and bladder scan also.

## 2018-12-07 LAB — CBC
HCT: 25.8 % — ABNORMAL LOW (ref 36.0–46.0)
Hemoglobin: 8.7 g/dL — ABNORMAL LOW (ref 12.0–15.0)
MCH: 32.2 pg (ref 26.0–34.0)
MCHC: 33.7 g/dL (ref 30.0–36.0)
MCV: 95.6 fL (ref 80.0–100.0)
Platelets: 265 10*3/uL (ref 150–400)
RBC: 2.7 MIL/uL — ABNORMAL LOW (ref 3.87–5.11)
RDW: 13.2 % (ref 11.5–15.5)
WBC: 7.5 10*3/uL (ref 4.0–10.5)
nRBC: 0 % (ref 0.0–0.2)

## 2018-12-07 LAB — BASIC METABOLIC PANEL
Anion gap: 11 (ref 5–15)
BUN: 6 mg/dL — ABNORMAL LOW (ref 8–23)
CO2: 26 mmol/L (ref 22–32)
Calcium: 8.5 mg/dL — ABNORMAL LOW (ref 8.9–10.3)
Chloride: 102 mmol/L (ref 98–111)
Creatinine, Ser: 0.83 mg/dL (ref 0.44–1.00)
GFR calc Af Amer: >60 mL/min (ref >60)
GFR calc non Af Amer: >60 mL/min (ref >60)
Glucose, Bld: 97 mg/dL (ref 70–99)
Potassium: 3.1 mmol/L — ABNORMAL LOW (ref 3.5–5.1)
Sodium: 139 mmol/L (ref 135–145)

## 2018-12-07 LAB — MAGNESIUM: Magnesium: 1.6 mg/dL — ABNORMAL LOW (ref 1.7–2.4)

## 2018-12-07 LAB — GLUCOSE, CAPILLARY
Glucose-Capillary: 97 mg/dL (ref 70–99)
Glucose-Capillary: 113 mg/dL — ABNORMAL HIGH (ref 70–99)
Glucose-Capillary: 105 mg/dL — ABNORMAL HIGH (ref 70–99)

## 2018-12-07 MED ORDER — SODIUM CHLORIDE 0.9 % IV SOLN
INTRAVENOUS | Status: DC | PRN
  Administered 2018-12-07: 1000 mL via INTRAVENOUS

## 2018-12-07 MED ORDER — POTASSIUM CHLORIDE CRYS ER 20 MEQ PO TBCR
40.0000 meq | EXTENDED_RELEASE_TABLET | ORAL | Status: AC
  Administered 2018-12-07 (×2): 40 meq via ORAL
  Filled 2018-12-07 (×2): qty 2

## 2018-12-07 MED ORDER — RISPERIDONE 1 MG PO TABS
0.5000 mg | ORAL_TABLET | Freq: Every day | ORAL | Status: DC
  Administered 2018-12-07: 0.5 mg via ORAL
  Filled 2018-12-07: qty 1

## 2018-12-07 MED ORDER — MAGNESIUM OXIDE 400 (241.3 MG) MG PO TABS
800.0000 mg | ORAL_TABLET | ORAL | Status: AC
  Administered 2018-12-07 (×2): 800 mg via ORAL
  Filled 2018-12-07 (×2): qty 2

## 2018-12-07 NOTE — Progress Notes (Signed)
PROGRESS NOTE    Kim Blankenship  [id number] DOB: 09/20/1945 DOA: 12/04/2018 PCP: Tobie Lords D, FNP   Brief Narrative:  73 year old with history of essential hypertension, hyperlipidemia, COPD, depression, atrial fibrillation not on anticoagulation, diastolic CHF, seizure disorder, alcohol use presented to the hospital with hematemesis.  She had presented in taking at least 20 tablets of ibuprofen at home weekly.  She was Hemoccult positive with about 3 g of hemoglobin drop.  GI was consulted who recommended endoscopy when able but at the moment patient is quite agitated. Mild AKi improving with fluids and on Rocephin for UTI.    Assessment & Plan:   Principal Problem:   GIB (gastrointestinal bleeding) Active Problems:   COPD (chronic obstructive pulmonary disease) (HCC)   Seizure (HCC)   PAF (paroxysmal atrial fibrillation) (HCC)   EtOH dependence (HCC)   Essential hypertension   UTI (urinary tract infection)   HLD (hyperlipidemia)   GERD (gastroesophageal reflux disease)   Depression   AKI (acute kidney injury) ([place])   Stroke (cerebrum) (HCC)   Chronic diastolic CHF (congestive heart failure) (HCC)   Agitation and delirium - Avoid Ativan as there is no evidence of alcohol withdrawal -Haldol ordered-helping little bit - Consulted psych- unable to see the patient as she was combative? - I will order bedtime Risperdal -Continue sitter in place.  Hematemesis/melena- upper GI bleed -Subsided, continue PPI and sucralfate - Spoke with Dr. Cristina Gong -given patient's agitation and being hemodynamically stable-would like to hold off on endoscopy today. -Supportive care.  Acute kidney injury, resolved -Baseline 0.79, admission creatinine 1.66.  Down to 0.8 -Supportive care, avoid nephrotoxic drugs.  Urinary tract infection with some hematuria.  -Follow-up culture. -Rocephin day 4/5.  Abnormal EKG -Normal QTC.  No acute ST-T changes.  Slight mild ST depression in the  lateral lead but she is asymptomatic  History of paroxysmal atrial fibrillation -Not on any anticoagulation.  Has elevated chadVASCs 5.  Unable to give due to GI bleeding anyways at this time, can discuss at outpatient.  Diastolic CHF, chronic with preserved ejection fraction 55%, grade 1 - Appears to be euvolemic.  No evidence of fluid overload.  Lisinopril on hold  History of seizure disorder -Seizure precaution.  Continue home [place].  Essential hypertension - Continue metoprolol.  Holding Aldactone due to mild AKI.  GERD -PPI  COPD -PRN albuterol  Depression -On Celexa  History of stroke -Aspirin on hold due to GI bleeding  Hyperlipidemia -Statin  DVT prophylaxis: SCDs Code Status: Full code Family Communication: None at bedside Disposition Plan: Still has quite a bit of frequent agitation.  Consultants:  GI  Procedures:   None  Antimicrobials:   None   Subjective: Patient was combative and aggressive overnight, early morning she received Haldol.  During my evaluation she was very calm and drowsy but easily arousable.  Did not have any complaints.  Review of Systems Otherwise negative except as per HPI, including: General = no fevers, chills, dizziness, malaise, fatigue HEENT/EYES = negative for pain, redness, loss of vision, double vision, blurred vision, loss of hearing, sore throat, hoarseness, dysphagia Cardiovascular= negative for chest pain, palpitation, murmurs, lower extremity swelling Respiratory/lungs= negative for shortness of breath, cough, hemoptysis, wheezing, mucus production Gastrointestinal= negative for nausea, vomiting,, abdominal pain, melena, hematemesis Genitourinary= negative for Dysuria, Hematuria, Change in Urinary Frequency MSK = Negative for arthralgia, myalgias, Back Pain, Joint swelling  Neurology= Negative for headache, seizures, numbness, tingling  Psychiatry= Negative for anxiety, depression, suicidal and homocidal  ideation  Allergy/Immunology= Medication/Food allergy as listed  Skin= Negative for Rash, lesions, ulcers, itching    Objective: Vitals:   12/07/18 0000 12/07/18 0300 12/07/18 0508 12/07/18 0957  BP: 115/72 (!) 159/85 (!) 126/58 127/62  Pulse: 96 98 82 82  Resp: 18 18 18 18   Temp:   98.9 F (37.2 C) 98.6 F (37 C)  TempSrc:   Oral Oral  SpO2: 95%  94% 95%  Weight:   64.2 kg   Height:        Intake/Output Summary (Last 24 hours) at 12/07/2018 1122 Last data filed at 12/07/2018 0300 Gross per 24 hour  Intake 360 ml  Output 500 ml  Net -140 ml   Filed Weights   12/05/18 0352 12/06/18 0350 12/07/18 0508  Weight: 66 kg 64.9 kg 64.2 kg    Examination: Constitutional: NAD, calm, comfortable Eyes: PERRL, lids and conjunctivae normal ENMT: Mucous membranes are moist. Posterior pharynx clear of any exudate or lesions.Normal dentition.  Neck: normal, supple, no masses, no thyromegaly Respiratory: clear to auscultation bilaterally, no wheezing, no crackles. Normal respiratory effort. No accessory muscle use.  Cardiovascular: Regular rate and rhythm, no murmurs / rubs / gallops. No extremity edema. 2+ pedal pulses. No carotid bruits.  Abdomen: no tenderness, no masses palpated. No hepatosplenomegaly. Bowel sounds positive.  Musculoskeletal: no clubbing / cyanosis. No joint deformity upper and lower extremities. Good ROM, no contractures. Normal muscle tone.  Skin: no rashes, lesions, ulcers. No induration Neurologic: CN 2-12 grossly intact. Sensation intact, DTR normal. Strength 4/5 in all 4.  Psychiatric: Calm, alert to her name.  Poor judgment and insight.   Data Reviewed:   CBC: Recent Labs  Lab 12/05/18 0146 12/05/18 1040 12/05/18 1644 12/06/18 0814 12/07/18 0551  WBC 9.7 8.7 9.5 6.4 7.5  HGB 9.6* 9.2* 9.3* 7.8* 8.7*  HCT 28.0* 26.4* 26.6* 22.3* 25.8*  MCV 95.2 93.6 94.3 94.5 95.6  PLT 317 279 294 213 [id number]   Basic Metabolic Panel: Recent Labs  Lab 12/04/18 1734  12/05/18 0728 12/06/18 0814 12/07/18 0551  NA 136 137 139 139  K 3.8 3.6 3.2* 3.1*  CL 102 103 106 102  CO2 21* 24 23 26   GLUCOSE 119* 105* 105* 97  BUN 39* 25* 11 6*  CREATININE 1.66* 1.14* 0.83 0.83  CALCIUM 9.0 8.8* 8.5* 8.5*  MG  --   --  1.7 1.6*   GFR: Estimated Creatinine Clearance: 53.7 mL/min (by C-G formula based on SCr of 0.83 mg/dL). Liver Function Tests: Recent Labs  Lab 12/04/18 1734  AST 24  ALT 23  ALKPHOS 56  BILITOT 0.4  PROT 6.0*  ALBUMIN 3.7   No results for input(s): LIPASE, AMYLASE in the last 168 hours. No results for input(s): AMMONIA in the last 168 hours. Coagulation Profile: Recent Labs  Lab 12/04/18 1734 12/05/18 0728  INR 1.0 1.1   Cardiac Enzymes: No results for input(s): CKTOTAL, CKMB, CKMBINDEX, TROPONINI in the last 168 hours. BNP (last 3 results) No results for input(s): PROBNP in the last 8760 hours. HbA1C: No results for input(s): HGBA1C in the last 72 hours. CBG: Recent Labs  Lab 12/07/18 0557  GLUCAP 97   Lipid Profile: No results for input(s): CHOL, HDL, LDLCALC, TRIG, CHOLHDL, LDLDIRECT in the last 72 hours. Thyroid Function Tests: No results for input(s): TSH, T4TOTAL, FREET4, T3FREE, THYROIDAB in the last 72 hours. Anemia Panel: Recent Labs    12/05/18 1040  VITAMINB12 554  FERRITIN 46  TIBC 277  IRON 26*  Sepsis Labs: Recent Labs  Lab 12/04/18 1734  LATICACIDVEN 1.5    Recent Results (from the past 240 hour(s))  Urine Culture     Status: Abnormal   Collection Time: 12/04/18  8:07 PM   Specimen: Urine, Random  Result Value Ref Range Status   Specimen Description URINE, RANDOM  Final   Special Requests   Final    NONE Performed at [facility], [place] [address] [place], [place] [address]    Culture (A)  Final    30,000 COLONIES/mL MULTIPLE SPECIES PRESENT, SUGGEST RECOLLECTION   Report Status 12/06/2018 FINAL  Final  SARS Coronavirus 2 [facility] order, Performed in [facility] hospital lab)  Nasopharyngeal Nasopharyngeal Swab     Status: None   Collection Time: 12/04/18 10:59 PM   Specimen: Nasopharyngeal Swab  Result Value Ref Range Status   SARS Coronavirus 2 NEGATIVE NEGATIVE Final    Comment: (NOTE) If result is NEGATIVE SARS-CoV-2 target nucleic acids are NOT DETECTED. The SARS-CoV-2 RNA is generally detectable in upper and lower  respiratory specimens during the acute phase of infection. The lowest  concentration of SARS-CoV-2 viral copies this assay can detect is 250  copies / mL. A negative result does not preclude SARS-CoV-2 infection  and should not be used as the sole basis for treatment or other  patient management decisions.  A negative result may occur with  improper specimen collection / handling, submission of specimen other  than nasopharyngeal swab, presence of viral mutation(s) within the  areas targeted by this assay, and inadequate number of viral copies  (<250 copies / mL). A negative result must be combined with clinical  observations, patient history, and epidemiological information. If result is POSITIVE SARS-CoV-2 target nucleic acids are DETECTED. The SARS-CoV-2 RNA is generally detectable in upper and lower  respiratory specimens dur ing the acute phase of infection.  Positive  results are indicative of active infection with SARS-CoV-2.  Clinical  correlation with patient history and other diagnostic information is  necessary to determine patient infection status.  Positive results do  not rule out bacterial infection or co-infection with other viruses. If result is PRESUMPTIVE POSTIVE SARS-CoV-2 nucleic acids MAY BE PRESENT.   A presumptive positive result was obtained on the submitted specimen  and confirmed on repeat testing.  While 2019 novel coronavirus  (SARS-CoV-2) nucleic acids may be present in the submitted sample  additional confirmatory testing may be necessary for epidemiological  and / or clinical management purposes  to  differentiate between  SARS-CoV-2 and other Sarbecovirus currently known to infect humans.  If clinically indicated additional testing with an alternate test  methodology [phone]) is advised. The SARS-CoV-2 RNA is generally  detectable in upper and lower respiratory sp ecimens during the acute  phase of infection. The expected result is Negative. Fact Sheet for Patients:  StrictlyIdeas.no Fact Sheet for Healthcare Providers: BankingDealers.co.za This test is not yet approved or cleared by the [place] FDA and has been authorized for detection and/or diagnosis of SARS-CoV-2 by FDA under an Emergency Use Authorization (EUA).  This EUA will remain in effect (meaning this test can be used) for the duration of the COVID-19 declaration under Section 564(b)(1) of the Act, 21 U.S.C. section 360bbb-3(b)(1), unless the authorization is terminated or revoked sooner. Performed at [facility], [place] [address] [place], [place] [address]   Culture, blood (Routine X 2) w Reflex to ID Panel     Status: None (Preliminary result)  Collection Time: 12/05/18  7:17 AM   Specimen: BLOOD  Result Value Ref Range Status   Specimen Description BLOOD LEFT ANTECUBITAL  Final   Special Requests   Final    BOTTLES DRAWN AEROBIC AND ANAEROBIC Blood Culture adequate volume   Culture   Final    NO GROWTH 2 DAYS Performed at [facility], [address], [place], [place] [address]    Report Status PENDING  Incomplete  Culture, blood (Routine X 2) w Reflex to ID Panel     Status: None (Preliminary result)   Collection Time: 12/05/18  7:24 AM   Specimen: BLOOD LEFT ARM  Result Value Ref Range Status   Specimen Description BLOOD LEFT ARM  Final   Special Requests   Final    BOTTLES DRAWN AEROBIC AND ANAEROBIC Blood Culture adequate volume   Culture   Final    NO GROWTH 2 DAYS Performed at [facility], [place] [address], [place], [place]  [address]    Report Status PENDING  Incomplete         Radiology Studies: No results found.      Scheduled Meds: . atorvastatin  20 mg Oral Daily  . citalopram  10 mg Oral Daily  . ferrous sulfate  325 mg Oral BID WC  . folic acid  1 mg Oral Daily  . levETIRAcetam  500 mg Oral BID  . LORazepam  0-4 mg Intravenous Q12H  . magnesium oxide  800 mg Oral Q4H  . metoprolol tartrate  25 mg Oral QHS  . metoprolol tartrate  50 mg Oral Daily  . multivitamin with minerals  1 tablet Oral Daily  . pantoprazole  40 mg Intravenous Q12H  . potassium chloride  40 mEq Oral Q4H  . risperiDONE  0.5 mg Oral QHS  . sucralfate  1 g Oral TID WC & HS  . thiamine  100 mg Oral Daily   Or  . thiamine  100 mg Intravenous Daily   Continuous Infusions: . sodium chloride    . sodium chloride 20 mL/hr at 12/06/18 0559  . cefTRIAXone (ROCEPHIN)  IV 1 g (12/07/18 0018)     LOS: 3 days   Time spent= 35 mins    Ankit Arsenio Loader, MD [facility]  If 7PM-7AM, please contact night-coverage [email or web address] 12/07/2018, 11:22 AM

## 2018-12-07 NOTE — Progress Notes (Signed)
Pt. Sitting in bed finishing up dinner. [id number] apologetic regarding her behavior.

## 2018-12-07 NOTE — Progress Notes (Signed)
Spoke with daughter Mickel Baas Updates given. Mickel Baas also spoke to pt.

## 2018-12-08 DIAGNOSIS — F05 Delirium due to known physiological condition: Secondary | ICD-10-CM

## 2018-12-08 DIAGNOSIS — R410 Disorientation, unspecified: Secondary | ICD-10-CM

## 2018-12-08 DIAGNOSIS — F600 Paranoid personality disorder: Secondary | ICD-10-CM

## 2018-12-08 LAB — AMMONIA: Ammonia: <9 umol/L — ABNORMAL LOW (ref 9–35)

## 2018-12-08 LAB — CBC
HCT: 24.6 % — ABNORMAL LOW (ref 36.0–46.0)
Hemoglobin: 8.6 g/dL — ABNORMAL LOW (ref 12.0–15.0)
MCH: 33 pg (ref 26.0–34.0)
MCHC: 35 g/dL (ref 30.0–36.0)
MCV: 94.3 fL (ref 80.0–100.0)
Platelets: 272 10*3/uL (ref 150–400)
RBC: 2.61 MIL/uL — ABNORMAL LOW (ref 3.87–5.11)
RDW: 13.2 % (ref 11.5–15.5)
WBC: 5.4 10*3/uL (ref 4.0–10.5)
nRBC: 0 % (ref 0.0–0.2)

## 2018-12-08 LAB — FOLATE RBC
Folate, Hemolysate: 561 ng/mL
Folate, RBC: 2166 ng/mL (ref >498)
Hematocrit: 25.9 % — ABNORMAL LOW (ref 34.0–46.6)

## 2018-12-08 LAB — BASIC METABOLIC PANEL
Anion gap: 11 (ref 5–15)
BUN: <5 mg/dL — ABNORMAL LOW (ref 8–23)
CO2: 24 mmol/L (ref 22–32)
Calcium: 8.9 mg/dL (ref 8.9–10.3)
Chloride: 105 mmol/L (ref 98–111)
Creatinine, Ser: 0.86 mg/dL (ref 0.44–1.00)
GFR calc Af Amer: >60 mL/min (ref >60)
GFR calc non Af Amer: >60 mL/min (ref >60)
Glucose, Bld: 135 mg/dL — ABNORMAL HIGH (ref 70–99)
Potassium: 4 mmol/L (ref 3.5–5.1)
Sodium: 140 mmol/L (ref 135–145)

## 2018-12-08 LAB — GLUCOSE, CAPILLARY: Glucose-Capillary: 119 mg/dL — ABNORMAL HIGH (ref 70–99)

## 2018-12-08 LAB — MAGNESIUM: Magnesium: 1.7 mg/dL (ref 1.7–2.4)

## 2018-12-08 LAB — TSH: TSH: 1.565 u[IU]/mL (ref 0.350–4.500)

## 2018-12-08 MED ORDER — HALOPERIDOL LACTATE 5 MG/ML IJ SOLN
5.0000 mg | Freq: Four times a day (QID) | INTRAMUSCULAR | Status: DC | PRN
  Administered 2018-12-08 (×2): 5 mg via INTRAVENOUS
  Filled 2018-12-08 (×3): qty 1

## 2018-12-08 MED ORDER — OLANZAPINE 5 MG PO TABS
5.0000 mg | ORAL_TABLET | Freq: Every day | ORAL | Status: DC
  Administered 2018-12-08 – 2018-12-09 (×2): 5 mg via ORAL
  Filled 2018-12-08 (×2): qty 1

## 2018-12-08 MED ORDER — LORAZEPAM 2 MG/ML IJ SOLN: 0.5000 mg | Freq: Once | INTRAMUSCULAR | Status: DC

## 2018-12-08 MED ORDER — HALOPERIDOL LACTATE 5 MG/ML IJ SOLN
5.0000 mg | Freq: Once | INTRAMUSCULAR | Status: AC
  Administered 2018-12-08: 5 mg via INTRAVENOUS
  Filled 2018-12-08: qty 1

## 2018-12-08 NOTE — Progress Notes (Signed)
PROGRESS NOTE    Kim Blankenship  [id number] DOB: [birth year] DOA: 12/04/2018 PCP: Tobie Lords D, FNP   Brief Narrative:  73 year old with history of essential hypertension, hyperlipidemia, COPD, depression, atrial fibrillation not on anticoagulation, diastolic CHF, seizure disorder, alcohol use presented to the hospital with hematemesis.  She had presented in taking at least 20 tablets of ibuprofen at home weekly.  She was Hemoccult positive with about 3 g of hemoglobin drop.  GI was consulted who recommended endoscopy when able but at the moment patient is quite agitated. Mild AKi improving with fluids and on Rocephin for UTI.  Patient has been quite delirious and combative in the hospital requiring frequent doses of Haldol and also started on risperidone.  Psychiatry has been consulted.   Assessment & Plan:   Principal Problem:   GIB (gastrointestinal bleeding) Active Problems:   COPD (chronic obstructive pulmonary disease) (HCC)   Seizure (HCC)   PAF (paroxysmal atrial fibrillation) (HCC)   EtOH dependence (HCC)   Essential hypertension   UTI (urinary tract infection)   HLD (hyperlipidemia)   GERD (gastroesophageal reflux disease)   Depression   AKI (acute kidney injury) ([place])   Stroke (cerebrum) (HCC)   Chronic diastolic CHF (congestive heart failure) (HCC)   Agitation and delirium, persistent - Still having significant amount of agitation and delirium.  Risperdal started last night with minimal help.  Haldol has been ordered.  Restraints in place.  Discussed with the daughter at bedside. -We will consult psychiatry for their assistance with care severe delirium and possibly needing for inpatient psych. -Continue sitter in place. She was on Ativan for alcohol withdrawal protocol, will discontinue benzodiazepines.  Unfortunately patient is not allowing any lab work or any medical work-up.  No focal neurologic sign therefore okay with holding off on MRI or CT of the head.  Hematemesis/melena- upper GI bleed, subsided -Subsided, continue PPI and sucralfate.  - Spoke with Dr. Cristina Gong -given patient's agitation and being hemodynamically stable-would like to hold off on endoscopy. -Supportive care.  Acute kidney injury, resolved -Baseline 0.79, admission creatinine 1.66.  Down to 0.8 -Supportive care, avoid nephrotoxic drugs.  Urinary tract infection with some hematuria.  -Follow-up culture. -Rocephin day 5/5.  Abnormal EKG -Normal QTC.  No acute ST-T changes.  Slight mild ST depression in the lateral lead but she is asymptomatic  History of paroxysmal atrial fibrillation -Not on any anticoagulation.  Has elevated chadVASCs 5.  Unable to give due to GI bleeding anyways at this time, can discuss at outpatient.  Diastolic CHF, chronic with preserved ejection fraction 55%, grade 1 - Appears to be euvolemic.  No evidence of fluid overload.  Lisinopril on hold  History of seizure disorder -Seizure precaution.  Continue home [place].  Essential hypertension - Continue metoprolol.  Holding Aldactone due to mild AKI.  GERD -PPI  COPD -PRN albuterol  Depression -On Celexa  History of stroke -Aspirin on hold due to GI bleeding  Hyperlipidemia -Statin  Discussed with RN  DVT prophylaxis: SCDs Code Status: Full code Family Communication: Daughter Mickel Baas at bedside during my evaluation Disposition Plan: Still quite agitated and delirious.  Psychiatry consulted.  Unsafe discharge  Consultants:  GI  Procedures:   None  Antimicrobials:   None   Subjective: Patient was again combative and delirious overnight with minimal help from Haldol.  When I saw the patient this morning she was in restraints with daughter at bedside.  She was somewhat calm but very confused.  She was also having some  hallucinations.  Daughter tells me that at home she is seen her getting delirious but very mild but this is very severe and has never happened before to the  extent.  Review of Systems Otherwise negative except as per HPI, including: General = no fevers, chills, dizziness, malaise, fatigue HEENT/EYES = negative for pain, redness, loss of vision, double vision, blurred vision, loss of hearing, sore throat, hoarseness, dysphagia Cardiovascular= negative for chest pain, palpitation, murmurs, lower extremity swelling Respiratory/lungs= negative for shortness of breath, cough, hemoptysis, wheezing, mucus production Gastrointestinal= negative for nausea, vomiting,, abdominal pain, melena, hematemesis Genitourinary= negative for Dysuria, Hematuria, Change in Urinary Frequency MSK = Negative for arthralgia, myalgias, Back Pain, Joint swelling  Neurology= Negative for headache, seizures, numbness, tingling  Psychiatry= Negative for anxiety, depression, suicidal and homocidal ideation Allergy/Immunology= Medication/Food allergy as listed  Skin= Negative for Rash, lesions, ulcers, itching     Objective: Vitals:   12/07/18 1607 12/07/18 2010 12/08/18 0013 12/08/18 0538  BP: (!) 137/93 133/63  (!) 162/85  Pulse: 77 82  90  Resp: 18 18  18   Temp: 98.6 F (37 C) 99 F (37.2 C)  97.7 F (36.5 C)  TempSrc: Oral Oral  Oral  SpO2: 100% 94%  97%  Weight:   66.2 kg   Height:        Intake/Output Summary (Last 24 hours) at 12/08/2018 0944 Last data filed at 12/08/2018 0540 Gross per 24 hour  Intake 160 ml  Output 200 ml  Net -40 ml   Filed Weights   12/06/18 0350 12/07/18 0508 12/08/18 0013  Weight: 64.9 kg 64.2 kg 66.2 kg    Examination: Will not allow me full examination given her extent of agitation and anxiety. She is currently in restraints. Only Alert to her.  Sitter and her daughter is at the bedside as well.  Data Reviewed:   CBC: Recent Labs  Lab 12/05/18 0146 12/05/18 1040 12/05/18 1644 12/06/18 0814 12/07/18 0551  WBC 9.7 8.7 9.5 6.4 7.5  HGB 9.6* 9.2* 9.3* 7.8* 8.7*  HCT 28.0* 26.4* 26.6* 22.3* 25.8*  MCV 95.2 93.6 94.3  94.5 95.6  PLT 317 279 294 213 [id number]   Basic Metabolic Panel: Recent Labs  Lab 12/04/18 1734 12/05/18 0728 12/06/18 0814 12/07/18 0551  NA 136 137 139 139  K 3.8 3.6 3.2* 3.1*  CL 102 103 106 102  CO2 21* 24 23 26   GLUCOSE 119* 105* 105* 97  BUN 39* 25* 11 6*  CREATININE 1.66* 1.14* 0.83 0.83  CALCIUM 9.0 8.8* 8.5* 8.5*  MG  --   --  1.7 1.6*   GFR: Estimated Creatinine Clearance: 58.2 mL/min (by C-G formula based on SCr of 0.83 mg/dL). Liver Function Tests: Recent Labs  Lab 12/04/18 1734  AST 24  ALT 23  ALKPHOS 56  BILITOT 0.4  PROT 6.0*  ALBUMIN 3.7   No results for input(s): LIPASE, AMYLASE in the last 168 hours. No results for input(s): AMMONIA in the last 168 hours. Coagulation Profile: Recent Labs  Lab 12/04/18 1734 12/05/18 0728  INR 1.0 1.1   Cardiac Enzymes: No results for input(s): CKTOTAL, CKMB, CKMBINDEX, TROPONINI in the last 168 hours. BNP (last 3 results) No results for input(s): PROBNP in the last 8760 hours. HbA1C: No results for input(s): HGBA1C in the last 72 hours. CBG: Recent Labs  Lab 12/07/18 0557 12/07/18 1643 12/07/18 2110 12/08/18 0630  GLUCAP 97 113* 105* 119*   Lipid Profile: No results for input(s): CHOL, HDL, LDLCALC,  TRIG, CHOLHDL, LDLDIRECT in the last 72 hours. Thyroid Function Tests: No results for input(s): TSH, T4TOTAL, FREET4, T3FREE, THYROIDAB in the last 72 hours. Anemia Panel: Recent Labs    12/05/18 1040  VITAMINB12 554  FERRITIN 46  TIBC 277  IRON 26*   Sepsis Labs: Recent Labs  Lab 12/04/18 1734  LATICACIDVEN 1.5    Recent Results (from the past 240 hour(s))  Urine Culture     Status: Abnormal   Collection Time: 12/04/18  8:07 PM   Specimen: Urine, Random  Result Value Ref Range Status   Specimen Description URINE, RANDOM  Final   Special Requests   Final    NONE Performed at [facility], [place] [address] [place], [place] [address]    Culture (A)  Final    30,000 COLONIES/mL  MULTIPLE SPECIES PRESENT, SUGGEST RECOLLECTION   Report Status 12/06/2018 FINAL  Final  SARS Coronavirus 2 [facility] order, Performed in [facility] hospital lab) Nasopharyngeal Nasopharyngeal Swab     Status: None   Collection Time: 12/04/18 10:59 PM   Specimen: Nasopharyngeal Swab  Result Value Ref Range Status   SARS Coronavirus 2 NEGATIVE NEGATIVE Final    Comment: (NOTE) If result is NEGATIVE SARS-CoV-2 target nucleic acids are NOT DETECTED. The SARS-CoV-2 RNA is generally detectable in upper and lower  respiratory specimens during the acute phase of infection. The lowest  concentration of SARS-CoV-2 viral copies this assay can detect is 250  copies / mL. A negative result does not preclude SARS-CoV-2 infection  and should not be used as the sole basis for treatment or other  patient management decisions.  A negative result may occur with  improper specimen collection / handling, submission of specimen other  than nasopharyngeal swab, presence of viral mutation(s) within the  areas targeted by this assay, and inadequate number of viral copies  (<250 copies / mL). A negative result must be combined with clinical  observations, patient history, and epidemiological information. If result is POSITIVE SARS-CoV-2 target nucleic acids are DETECTED. The SARS-CoV-2 RNA is generally detectable in upper and lower  respiratory specimens dur ing the acute phase of infection.  Positive  results are indicative of active infection with SARS-CoV-2.  Clinical  correlation with patient history and other diagnostic information is  necessary to determine patient infection status.  Positive results do  not rule out bacterial infection or co-infection with other viruses. If result is PRESUMPTIVE POSTIVE SARS-CoV-2 nucleic acids MAY BE PRESENT.   A presumptive positive result was obtained on the submitted specimen  and confirmed on repeat testing.  While 2019 novel coronavirus  (SARS-CoV-2) nucleic  acids may be present in the submitted sample  additional confirmatory testing may be necessary for epidemiological  and / or clinical management purposes  to differentiate between  SARS-CoV-2 and other Sarbecovirus currently known to infect humans.  If clinically indicated additional testing with an alternate test  methodology [phone]) is advised. The SARS-CoV-2 RNA is generally  detectable in upper and lower respiratory sp ecimens during the acute  phase of infection. The expected result is Negative. Fact Sheet for Patients:  StrictlyIdeas.no Fact Sheet for Healthcare Providers: BankingDealers.co.za This test is not yet approved or cleared by the [place] FDA and has been authorized for detection and/or diagnosis of SARS-CoV-2 by FDA under an Emergency Use Authorization (EUA).  This EUA will remain in effect (meaning this test can be used) for the duration of the COVID-19 declaration under Section 564(b)(1) of the  Act, 21 U.S.C. section 360bbb-3(b)(1), unless the authorization is terminated or revoked sooner. Performed at [facility], [place] [address], [place], [place] [address]   Culture, blood (Routine X 2) w Reflex to ID Panel     Status: None (Preliminary result)   Collection Time: 12/05/18  7:17 AM   Specimen: BLOOD  Result Value Ref Range Status   Specimen Description BLOOD LEFT ANTECUBITAL  Final   Special Requests   Final    BOTTLES DRAWN AEROBIC AND ANAEROBIC Blood Culture adequate volume   Culture   Final    NO GROWTH 3 DAYS Performed at [facility], [place] [address] [place], [place] [address]    Report Status PENDING  Incomplete  Culture, blood (Routine X 2) w Reflex to ID Panel     Status: None (Preliminary result)   Collection Time: 12/05/18  7:24 AM   Specimen: BLOOD LEFT ARM  Result Value Ref Range Status   Specimen Description BLOOD LEFT ARM  Final   Special Requests   Final    BOTTLES DRAWN  AEROBIC AND ANAEROBIC Blood Culture adequate volume   Culture   Final    NO GROWTH 3 DAYS Performed at [facility], [place] [address] [place], [place] [address]    Report Status PENDING  Incomplete         Radiology Studies: No results found.      Scheduled Meds: . atorvastatin  20 mg Oral Daily  . citalopram  10 mg Oral Daily  . ferrous sulfate  325 mg Oral BID WC  . folic acid  1 mg Oral Daily  . levETIRAcetam  500 mg Oral BID  . LORazepam  0-4 mg Intravenous Q12H  . metoprolol tartrate  25 mg Oral QHS  . metoprolol tartrate  50 mg Oral Daily  . multivitamin with minerals  1 tablet Oral Daily  . pantoprazole  40 mg Intravenous Q12H  . risperiDONE  0.5 mg Oral QHS  . sucralfate  1 g Oral TID WC & HS  . thiamine  100 mg Oral Daily   Or  . thiamine  100 mg Intravenous Daily   Continuous Infusions: . sodium chloride    . sodium chloride 20 mL/hr at 12/06/18 0559  . sodium chloride 1,000 mL (12/07/18 2229)  . cefTRIAXone (ROCEPHIN)  IV 1 g (12/07/18 2231)     LOS: 4 days   Time spent= 35 mins    Felipe Paluch Arsenio Loader, MD [facility]  If 7PM-7AM, please contact night-coverage [email or web address] 12/08/2018, 9:44 AM

## 2018-12-08 NOTE — Progress Notes (Signed)
At pts. Bedside with primary RN, pt. Yelling and screaming attempting to get out of restraints. Pt. Able to remove RLL restraint. Restraints checked. Pt. Offered toileting. Will continue to monitor.

## 2018-12-08 NOTE — Care Management Important Message (Signed)
Important Message  Patient Details  Name: Kim Blankenship MRN: [id number] Date of Birth: July 25, 1945   Medicare Important Message Given:  Yes     Orbie Pyo 12/08/2018, 3:24 PM

## 2018-12-08 NOTE — Progress Notes (Signed)
Number dialed for patient's sister, [name], per patient request. Patient now talking to [facility] via telephone.

## 2018-12-08 NOTE — Consult Note (Addendum)
Telepsych Consultation   Reason for Consult:  "This is a reconsult. Need assistance with appropriate med adjustment and eval for possible inpatient [facility] admission." Referring Physician:  Dr. Gerlean Ren Location of Patient: [facility] Location of Provider: [facility]  Patient Identification: Kim Blankenship MRN:  [id number] Principal Diagnosis: Delirium Diagnosis:  Principal Problem:   GIB (gastrointestinal bleeding) Active Problems:   COPD (chronic obstructive pulmonary disease) (HCC)   Seizure ([place])   PAF (paroxysmal atrial fibrillation) (HCC)   EtOH dependence ([place])   Essential hypertension   UTI (urinary tract infection)   HLD (hyperlipidemia)   GERD (gastroesophageal reflux disease)   Depression   AKI (acute kidney injury) ([place])   Stroke (cerebrum) (HCC)   Chronic diastolic CHF (congestive heart failure) ([place])   Total Time spent with patient: 1 hour  Subjective:   Kim Blankenship is a 73 y.o. female patient admitted with GIB.  HPI:   Per chart review, patient was admitted with GIB due to excessive use of Ibuprofen. Her hospital course has been complicated by UTI, alcohol withdrawal and agitation/delirium. She requires restraints for agitation. She has received 10 mg of Haldol over the past 24 hours. Home medications include Celexa 10 mg daily.   On interview, Kim Blankenship reports that she is doing well. She reports coming to [facility] for a family reunion. She was unable to find her daughter and believes that she was "taken by people." She later reports that she just has not come to the hospital to visit her today. She reports "terrible memory loss." She is oriented to person and place. She is able to look at the date on the board in her room. She reports September twice when asked the year. She is unclear of the reason she is admitted to the hospital. She reports that her mood is good. She denies SI, HI or AVH. She denies problems with sleep or appetite. She repeatedly  asks about her daughter and informs this notewriter that the questions that she is asking is irrelevant to finding her daughter.   Past Psychiatric History: Alcohol abuse and depression.   Risk to Self:  None. Denies SI.  Risk to Others:  None. Denies HI.  Prior Inpatient Therapy:  Denies  Prior Outpatient Therapy:  Denies   Past Medical History:  Past Medical History:  Diagnosis Date  . Asthma   . Atrial fibrillation ([place])   . COPD (chronic obstructive pulmonary disease) ([place])   . Hypertension   . Irregular heart rate   . PRES (posterior reversible encephalopathy syndrome)   . Stroke [facility])     Past Surgical History:  Procedure Laterality Date  . CESAREAN SECTION    . Left Foot Reconstruction    . OPEN REDUCTION INTERNAL FIXATION (ORIF) METACARPAL Right 03/10/2015   Procedure: OPEN REDUCTION INTERNAL FIXATION (ORIF) RIGHT LONG  METACARPAL FRACTURE;  Surgeon: Leanora Cover, MD;  Location: [facility];  Service: Orthopedics;  Laterality: Right;  . TONSILLECTOMY    . TOTAL HIP ARTHROPLASTY Right 02/09/2016   Procedure: TOTAL HIP ARTHROPLASTY ANTERIOR APPROACH;  Surgeon: Frederik Pear, MD;  Location: [facility];  Service: Orthopedics;  Laterality: Right;   Family History:  Family History  Problem Relation Age of Onset  . Dementia Father    Family Psychiatric  History: Denies  Social History:  Social History   Substance and Sexual Activity  Alcohol Use Yes   Comment: wine daily     Social History   Substance and Sexual Activity  Drug Use No    Social History   Socioeconomic History  . Marital status: Divorced    Spouse name: Not on file  . Number of children: 1  . Years of education: Not on file  . Highest education level: Not on file  Occupational History  . Not on file  Social Needs  . Financial resource strain: Not on file  . Food insecurity    Worry: Not on file    Inability: Not on file  . Transportation needs    Medical: Not on file     Non-medical: Not on file  Tobacco Use  . Smoking status: Former Smoker    Packs/day: 2.00    Years: 30.00    Pack years: 60.00    Types: Cigarettes    Quit date: 04/1995    Years since quitting: 23.7  . Smokeless tobacco: Never Used  Substance and Sexual Activity  . Alcohol use: Yes    Comment: wine daily  . Drug use: No  . Sexual activity: Not on file  Lifestyle  . Physical activity    Days per week: Not on file    Minutes per session: Not on file  . Stress: Not on file  Relationships  . Social Herbalist on phone: Not on file    Gets together: Not on file    Attends religious service: Not on file    Active member of club or organization: Not on file    Attends meetings of clubs or organizations: Not on file    Relationship status: Not on file  Other Topics Concern  . Not on file  Social History Narrative   Lives with daughter and her son   Caffeine use: 1 cup coffee every morning   Additional Social History: She lives in [place], [place]. She reports occasional use of alcohol. She denies a history of heavy alcohol use. She denies illicit drug use.     Allergies:   Allergies  Allergen Reactions  . Tape Other (See Comments)    THE PATIENT'S SKIN IS THIN AND TEARS VERY EASILY (she "picks" at it; please use coban wrap)    Labs:  Results for orders placed or performed during the hospital encounter of 12/04/18 (from the past 48 hour(s))  Basic metabolic panel     Status: Abnormal   Collection Time: 12/07/18  5:51 AM  Result Value Ref Range   Sodium 139 135 - 145 mmol/L   Potassium 3.1 (L) 3.5 - 5.1 mmol/L   Chloride 102 98 - 111 mmol/L   CO2 26 22 - 32 mmol/L   Glucose, Bld 97 70 - 99 mg/dL   BUN 6 (L) 8 - 23 mg/dL   Creatinine, Ser 0.83 0.44 - 1.00 mg/dL   Calcium 8.5 (L) 8.9 - 10.3 mg/dL   GFR calc non Af Amer >60 >60 mL/min   GFR calc Af Amer >60 >60 mL/min   Anion gap 11 5 - 15    Comment: Performed at [facility], [place] [address],  [place], [place] [address]  CBC     Status: Abnormal   Collection Time: 12/07/18  5:51 AM  Result Value Ref Range   WBC 7.5 4.0 - 10.5 K/uL   RBC 2.70 (L) 3.87 - 5.11 MIL/uL   Hemoglobin 8.7 (L) 12.0 - 15.0 g/dL   HCT 25.8 (L) 36.0 - 46.0 %   MCV 95.6 80.0 - 100.0 fL   MCH 32.2 26.0 - 34.0  pg   MCHC 33.7 30.0 - 36.0 g/dL   RDW 13.2 11.5 - 15.5 %   Platelets 265 150 - 400 K/uL   nRBC 0.0 0.0 - 0.2 %    Comment: Performed at [facility], [place] [address], [place], [place] [address]  Magnesium     Status: Abnormal   Collection Time: 12/07/18  5:51 AM  Result Value Ref Range   Magnesium 1.6 (L) 1.7 - 2.4 mg/dL    Comment: Performed at [facility] [address], [place], [place] [address]  Glucose, capillary     Status: None   Collection Time: 12/07/18  5:57 AM  Result Value Ref Range   Glucose-Capillary 97 70 - 99 mg/dL   Comment 1 Notify RN    Comment 2 Document in Chart   Glucose, capillary     Status: Abnormal   Collection Time: 12/07/18  4:43 PM  Result Value Ref Range   Glucose-Capillary 113 (H) 70 - 99 mg/dL  Glucose, capillary     Status: Abnormal   Collection Time: 12/07/18  9:10 PM  Result Value Ref Range   Glucose-Capillary 105 (H) 70 - 99 mg/dL  Glucose, capillary     Status: Abnormal   Collection Time: 12/08/18  6:30 AM  Result Value Ref Range   Glucose-Capillary 119 (H) 70 - 99 mg/dL  Basic metabolic panel     Status: Abnormal   Collection Time: 12/08/18 10:09 AM  Result Value Ref Range   Sodium 140 135 - 145 mmol/L   Potassium 4.0 3.5 - 5.1 mmol/L   Chloride 105 98 - 111 mmol/L   CO2 24 22 - 32 mmol/L   Glucose, Bld 135 (H) 70 - 99 mg/dL   BUN <5 (L) 8 - 23 mg/dL   Creatinine, Ser 0.86 0.44 - 1.00 mg/dL   Calcium 8.9 8.9 - 10.3 mg/dL   GFR calc non Af Amer >60 >60 mL/min   GFR calc Af Amer >60 >60 mL/min   Anion gap 11 5 - 15    Comment: Performed at [facility], [place] [address], [place], [place] [address]  CBC     Status: Abnormal    Collection Time: 12/08/18 10:09 AM  Result Value Ref Range   WBC 5.4 4.0 - 10.5 K/uL   RBC 2.61 (L) 3.87 - 5.11 MIL/uL   Hemoglobin 8.6 (L) 12.0 - 15.0 g/dL   HCT 24.6 (L) 36.0 - 46.0 %   MCV 94.3 80.0 - 100.0 fL   MCH 33.0 26.0 - 34.0 pg   MCHC 35.0 30.0 - 36.0 g/dL   RDW 13.2 11.5 - 15.5 %   Platelets 272 150 - 400 K/uL   nRBC 0.0 0.0 - 0.2 %    Comment: Performed at [facility], [place] [address] [place], [place] [address]  Magnesium     Status: None   Collection Time: 12/08/18 10:09 AM  Result Value Ref Range   Magnesium 1.7 1.7 - 2.4 mg/dL    Comment: Performed at [facility] [address], [place], [place] [address]  TSH     Status: None   Collection Time: 12/08/18 10:09 AM  Result Value Ref Range   TSH 1.565 0.350 - 4.500 uIU/mL    Comment: Performed by a 3rd Generation assay with a functional sensitivity of <=0.01 uIU/mL. Performed at [facility], [place] [address], [place],  [address]   Ammonia  Status: Abnormal   Collection Time: 12/08/18 10:09 AM  Result Value Ref Range   Ammonia <9 (L) 9 - 35 umol/L    Comment: Performed at [facility], [place] [address], [place], [place] [address]    Medications:  Current Facility-Administered Medications  Medication Dose Route Frequency Provider Last Rate Last Dose  . 0.9 %  sodium chloride infusion   Intravenous Continuous Buccini, Robert, MD      . 0.9 %  sodium chloride infusion   Intravenous Continuous Ronald Lobo, MD 20 mL/hr at 12/06/18 0559    . 0.9 %  sodium chloride infusion   Intravenous PRN Damita Lack, MD 10 mL/hr at 12/07/18 2229 1,000 mL at 12/07/18 2229  . acetaminophen (TYLENOL) tablet 650 mg  650 mg Oral Q6H PRN Ivor Costa, MD   650 mg at 12/06/18 0606   Or  . acetaminophen (TYLENOL) suppository 650 mg  650 mg Rectal Q6H PRN Ivor Costa, MD      . albuterol (PROVENTIL) (2.5 MG/3ML) 0.083% nebulizer solution 3 mL  3 mL Inhalation Q4H PRN Ivor Costa, MD   3 mL at 12/05/18  2116  . atorvastatin (LIPITOR) tablet 20 mg  20 mg Oral Daily Ivor Costa, MD   20 mg at 12/08/18 1041  . cefTRIAXone (ROCEPHIN) 1 g in sodium chloride 0.9 % 100 mL IVPB  1 g Intravenous Q24H Ivor Costa, MD 200 mL/hr at 12/07/18 2231 1 g at 12/07/18 2231  . citalopram (CELEXA) tablet 10 mg  10 mg Oral Daily Ivor Costa, MD   10 mg at 12/08/18 1041  . ferrous sulfate tablet 325 mg  325 mg Oral BID WC Amin, Ankit Chirag, MD   325 mg at 12/08/18 1041  . folic acid (FOLVITE) tablet 1 mg  1 mg Oral Daily Ivor Costa, MD   1 mg at 12/08/18 1041  . haloperidol lactate (HALDOL) injection 5 mg  5 mg Intravenous Q6H PRN Amin, Ankit Chirag, MD   5 mg at 12/08/18 0800  . hydrALAZINE (APRESOLINE) injection 10 mg  10 mg Intravenous Q4H PRN Amin, Ankit Chirag, MD      . levETIRAcetam (KEPPRA) tablet 500 mg  500 mg Oral BID Ivor Costa, MD   500 mg at 12/08/18 1041  . metoprolol tartrate (LOPRESSOR) injection 5 mg  5 mg Intravenous Q5 min PRN Blount, Scarlette Shorts T, NP      . metoprolol tartrate (LOPRESSOR) tablet 25 mg  25 mg Oral QHS Ivor Costa, MD   25 mg at 12/07/18 2216  . metoprolol tartrate (LOPRESSOR) tablet 50 mg  50 mg Oral Daily Ivor Costa, MD   50 mg at 12/08/18 1041  . multivitamin with minerals tablet 1 tablet  1 tablet Oral Daily Ivor Costa, MD   1 tablet at 12/08/18 1041  . ondansetron (ZOFRAN) tablet 4 mg  4 mg Oral Q6H PRN Ivor Costa, MD       Or  . ondansetron [facility]) injection 4 mg  4 mg Intravenous Q6H PRN Ivor Costa, MD      . pantoprazole (PROTONIX) injection 40 mg  40 mg Intravenous Q12H Amin, Ankit Chirag, MD   40 mg at 12/08/18 1041  . polyethylene glycol (MIRALAX / GLYCOLAX) packet 17 g  17 g Oral Daily PRN Amin, Ankit Chirag, MD      . risperiDONE (RISPERDAL) tablet 0.5 mg  0.5 mg Oral QHS Amin, Ankit Chirag, MD   0.5 mg at 12/07/18 2216  . senna-docusate (Senokot-S) tablet  2 tablet  2 tablet Oral QHS PRN Amin, Ankit Chirag, MD      . sucralfate (CARAFATE) 1 GM/10ML suspension 1 g  1 g Oral TID  WC & HS Amin, Ankit Chirag, MD   1 g at 12/08/18 1047  . thiamine (VITAMIN B-1) tablet 100 mg  100 mg Oral Daily Ivor Costa, MD   100 mg at 12/08/18 1041   Or  . thiamine (B-1) injection 100 mg  100 mg Intravenous Daily Ivor Costa, MD        Musculoskeletal: Strength & Muscle Tone: Atrophy associated with aging. Gait & Station: UTA since patient is lying in bed. Patient leans: N/A  Psychiatric Specialty Exam: Physical Exam  Nursing note and vitals reviewed. Constitutional: She is oriented to person, place, and time. She appears well-developed and well-nourished.  HENT:  Head: Normocephalic and atraumatic.  Neck: Normal range of motion.  Respiratory: Effort normal.  Musculoskeletal: Normal range of motion.  Neurological: She is alert and oriented to person, place, and time.  Psychiatric: Her behavior is normal. Her affect is labile. Her speech is tangential. Thought content is paranoid and delusional. Cognition and memory are impaired. She expresses impulsivity.    Review of Systems  Gastrointestinal: Negative for constipation, diarrhea, nausea and vomiting.  Psychiatric/Behavioral: Negative for hallucinations and suicidal ideas. The patient does not have insomnia.   All other systems reviewed and are negative.   Blood pressure (!) 162/85, pulse 90, temperature 97.7 F (36.5 C), temperature source Oral, resp. rate 18, height 5\' 4"  (1.626 m), weight 66.2 kg, SpO2 97 %.Body mass index is 25.06 kg/m.  General Appearance: Fairly Groomed, elderly, Caucasian female, wearing a hospital gown who is lying in bed. NAD.   Eye Contact:  Good  Speech:  Clear and Coherent and Normal Rate  Volume:  Normal  Mood:  Irritable  Affect:  Labile  Thought Process:  Disorganized and Descriptions of Associations: Tangential  Orientation:  Full (Time, Place, and Person)  Thought Content:  Delusions and Paranoid Ideation  Suicidal Thoughts:  No  Homicidal Thoughts:  No  Memory:  Immediate;    Fair Recent;   Fair Remote;   Fair  Judgement:  Impaired  Insight:  Poor  Psychomotor Activity:  Normal  Concentration:  Concentration: Poor and Attention Span: Poor  Recall:  [employer] of Knowledge:  Fair  Language:  Fair  Akathisia:  No  Handed:  Right  AIMS (if indicated):   N/A  Assets:  Housing Resilience Social Support  ADL's:  Intact  Cognition: Impaired due to altered mental status.   Sleep:   Okay   Assessment:  Kim Blankenship is a 73 y.o. female who was admitted with GIB due to excessive use of Ibuprofen. Patient is disorganized in thought process with paranoid and delusional beliefs. Her attention span is poor. It appears that her mental status fluctuates which is most consistent with delirium due to her medical condition. Recommend Zyprexa for agitation.    Treatment Plan Summary: -Continue Celexa 10 mg daily for depression.  -Start Zyprexa 5 mg qhs for agitation/psychosis. Can increase to 5 mg BID if only partially effective for symptom management.  -EKG reviewed and QTc 462 on 9/6. Please closely monitor when starting or increasing QTc prolonging agents.   -Psychiatry will sign off on patient at this time. Please consult psychiatry again as needed.    Disposition: Patient does not meet criteria for psychiatric inpatient admission.  This service was provided via telemedicine using  a 2-way, interactive audio and Radiographer, therapeutic.  Names of all persons participating in this telemedicine service and their role in this encounter. Name: Buford Dresser, DO Role: Psychiatrist  Name: Kim Blankenship  Role: Patient    Faythe Dingwall, DO 12/08/2018 1:36 PM

## 2018-12-08 NOTE — Plan of Care (Signed)
  Problem: Education: Goal: Knowledge of General Education information will improve Description: Including pain rating scale, medication(s)/side effects and non-pharmacologic comfort measures Outcome: Progressing   Problem: Health Behavior/Discharge Planning: Goal: Ability to manage health-related needs will improve Outcome: Progressing   Problem: Clinical Measurements: Goal: Ability to maintain clinical measurements within normal limits will improve Outcome: Progressing Goal: Will remain free from infection Outcome: Progressing Goal: Diagnostic test results will improve Outcome: Progressing Goal: Respiratory complications will improve Outcome: Progressing Goal: Cardiovascular complication will be avoided Outcome: Progressing   Problem: Activity: Goal: Risk for activity intolerance will decrease Outcome: Progressing   Problem: Nutrition: Goal: Adequate nutrition will be maintained Outcome: Progressing   Problem: Coping: Goal: Level of anxiety will decrease Outcome: Progressing   Problem: Elimination: Goal: Will not experience complications related to bowel motility Outcome: Progressing Goal: Will not experience complications related to urinary retention Outcome: Progressing   Problem: Pain Managment: Goal: General experience of comfort will improve Outcome: Progressing   Problem: Safety: Goal: Ability to remain free from injury will improve Outcome: Progressing   Problem: Skin Integrity: Goal: Risk for impaired skin integrity will decrease Outcome: Progressing   Problem: Education: Goal: Ability to demonstrate management of disease process will improve Outcome: Progressing Goal: Ability to verbalize understanding of medication therapies will improve Outcome: Progressing Goal: Individualized Educational Video(s) Outcome: Progressing   Problem: Activity: Goal: Capacity to carry out activities will improve Outcome: Progressing   Problem: Cardiac: Goal:  Ability to achieve and maintain adequate cardiopulmonary perfusion will improve Outcome: Progressing   Problem: Education: Goal: Ability to demonstrate management of disease process will improve Outcome: Progressing Goal: Ability to verbalize understanding of medication therapies will improve Outcome: Progressing Goal: Individualized Educational Video(s) Outcome: Progressing   Problem: Activity: Goal: Capacity to carry out activities will improve Outcome: Progressing   Problem: Cardiac: Goal: Ability to achieve and maintain adequate cardiopulmonary perfusion will improve Outcome: Progressing   

## 2018-12-08 NOTE — Progress Notes (Signed)
Patient is very agitated and confused. Walking in the hall hallucinating and is trying to leave. Patient was becoming aggressive and security was called to the floor. MD was paged and placed an order for 5 mg of Haldol and 4 point restraints. Patient is now in bed in restraints and is still very agitated. Not able to place pt back on tele at this time. Will continue to monitor.

## 2018-12-09 DIAGNOSIS — D62 Acute posthemorrhagic anemia: Secondary | ICD-10-CM

## 2018-12-09 DIAGNOSIS — I480 Paroxysmal atrial fibrillation: Secondary | ICD-10-CM

## 2018-12-09 LAB — BASIC METABOLIC PANEL
Anion gap: 11 (ref 5–15)
BUN: <5 mg/dL — ABNORMAL LOW (ref 8–23)
CO2: 24 mmol/L (ref 22–32)
Calcium: 8.8 mg/dL — ABNORMAL LOW (ref 8.9–10.3)
Chloride: 107 mmol/L (ref 98–111)
Creatinine, Ser: 0.76 mg/dL (ref 0.44–1.00)
GFR calc Af Amer: >60 mL/min (ref >60)
GFR calc non Af Amer: >60 mL/min (ref >60)
Glucose, Bld: 100 mg/dL — ABNORMAL HIGH (ref 70–99)
Potassium: 3.5 mmol/L (ref 3.5–5.1)
Sodium: 142 mmol/L (ref 135–145)

## 2018-12-09 LAB — CBC
HCT: 26.2 % — ABNORMAL LOW (ref 36.0–46.0)
Hemoglobin: 8.8 g/dL — ABNORMAL LOW (ref 12.0–15.0)
MCH: 32.1 pg (ref 26.0–34.0)
MCHC: 33.6 g/dL (ref 30.0–36.0)
MCV: 95.6 fL (ref 80.0–100.0)
Platelets: 323 10*3/uL (ref 150–400)
RBC: 2.74 MIL/uL — ABNORMAL LOW (ref 3.87–5.11)
RDW: 13.4 % (ref 11.5–15.5)
WBC: 4.4 10*3/uL (ref 4.0–10.5)
nRBC: 0 % (ref 0.0–0.2)

## 2018-12-09 LAB — MAGNESIUM: Magnesium: 1.7 mg/dL (ref 1.7–2.4)

## 2018-12-09 MED ORDER — LORATADINE 10 MG PO TABS
10.0000 mg | ORAL_TABLET | Freq: Every day | ORAL | Status: DC
  Administered 2018-12-09 – 2018-12-10 (×2): 10 mg via ORAL
  Filled 2018-12-09 (×2): qty 1

## 2018-12-09 MED ORDER — INFLUENZA VAC A&B SA ADJ QUAD 0.5 ML IM PRSY
0.5000 mL | PREFILLED_SYRINGE | INTRAMUSCULAR | Status: AC
  Administered 2018-12-10: 0.5 mL via INTRAMUSCULAR
  Filled 2018-12-09: qty 0.5

## 2018-12-09 NOTE — Progress Notes (Signed)
Attempted to give pt haldol for agitation after yelling at sitter and throwing her out of room but pt refused medication. Told this nurse to "get the hell out of my room" and grabbed stethoscope and attempted to pull it off neck. Haldol not given at this time.

## 2018-12-09 NOTE — Progress Notes (Addendum)
PROGRESS NOTE   Kim Blankenship  [id number]    DOB: 05-15-1945    DOA: 12/04/2018  PCP: Gregor Hams, FNP   I have briefly reviewed patients previous medical records in [facility]  Chief Complaint  Patient presents with   Hematemesis    Brief Narrative:  73 year old female, reportedly lives with her spouse, PMH of HTN, HLD, COPD, depression, A. fib not on anticoagulation, chronic diastolic CHF, seizure disorder, alcohol use presented to the hospital with hematemesis in the context of taking at least 20 tablets of ibuprofen weekly at home.  She was Hemoccult positive and had approximately 3 g hemoglobin drop.  Hospital course complicated by acute agitation/delirium for which psychiatry consulted.  Mild AKI resolved, being treated for suspected UTI, [facility] consulted and recommend endoscopy when patient able to cooperate.   Assessment & Plan:   Principal Problem:   Delirium Active Problems:   COPD (chronic obstructive pulmonary disease) (HCC)   Seizure (HCC)   PAF (paroxysmal atrial fibrillation) (HCC)   EtOH dependence (HCC)   Essential hypertension   UTI (urinary tract infection)   HLD (hyperlipidemia)   GERD (gastroesophageal reflux disease)   Depression   AKI (acute kidney injury) ([place])   Stroke (cerebrum) (HCC)   GIB (gastrointestinal bleeding)   Chronic diastolic CHF (congestive heart failure) (HCC)   Agitated delirium, persistent  As per daughter, at baseline apart from mild memory deficit issues, is quite coherent.  She did not have any mental status changes while she left her home and even the first day in the hospital.  Etiology unclear but suspect multifactorial, some alcohol withdrawal, UTI, sleep deprivation (had not slept for 3 days PTA), acute illness and hospital delirium complicating underlying mild dementia.  As per chart review, has had Risperdal with minimal effect, ongoing PRN Haldol use, soft restraints which are currently off.  Has 1: 1  Air cabin crew, continue.  Psychiatry consultation 9/8 appreciated and as per recommendations was started on Zyprexa 5 mg at bedtime and consider adding Zyprexa 5 mg in the morning if agitation and delirium persist.  CIWA protocol benzodiazepines were discontinued.  No focal deficits and patient not cooperating with labs or procedures and hence held off on brain imaging.  Agitated early this morning but on my visit sleeping, arousable and cooperative.  Continue current management (home dose of Celexa, Zyprexa at bedtime, thiamine supplements and as needed Haldol) and close monitoring.  Monitor EKG periodically for QTC prolongation.  EKG 9/6: QTC 462.  Requested again today.  As per nursing update, since this morning patient has been much better, calm and cooperative without agitation.  Planning to monitor off of sitter.  Acute upper GI bleed  Presented with hematemesis and melena.  Suspect due to NSAID use and may have underlying gastritis, ulcer disease versus other etiologies.  Clinically seems to have stopped given stable hemoglobin for several days now.  Continue Protonix 40 mg IV twice daily and sucralfate.  Prior hospitalist discussed with Dr. Cristina Gong, [facility] and holding off EGD until patient mental status allows.  Acute blood loss anemia  Hemoglobin 13.4 on 04/04/2018.  Presented with hemoglobin of 10.2 which is gradually drifted down but stable in the mid 8 g range since 9/7.   Suspect due to acute upper GI bleed.  Follow CBCs closely.  Acute kidney injury  Baseline creatinine 0.79.  Presented with creatinine of 1.66.  Resolved.  Avoid nephrotoxic's.  Suspected UTI  Urine culture not helpful i.e. 30,000 colonies  of multiple species.  Blood cultures x2: Negative to date  Has completed 5 days of IV ceftriaxone, discontinued ceftriaxone after 9/8 dose.  Hypomagnesemia/hypokalemia  Replaced.  Paroxysmal A. Fib  Not on telemetry, likely related to agitated  delirium and lack of cooperation.  Continue metoprolol.  chadVASCs 5.  However unable to do anticoagulation given acute GI bleed, may follow-up outpatient with her PCP for further decisions.  Chronic diastolic CHF  TTE with LVEF 55% and grade 1 diastolic dysfunction.  Clinically euvolemic.  Seizure disorder  Continue Keppra.  No reported seizures.  Per daughter, patient has not had a seizure in a long time.  Essential hypertension  Mildly uncontrolled.  Continue current metoprolol.  Holding lisinopril and Aldactone due to recent acute kidney injury  GERD  Continue PPI.  COPD  Stable without clinical bronchospasm  Depression  Continue Celexa.  Hyperlipidemia  Continue statins.  History of CVA  Home aspirin on hold due to GI bleeding.  Anticoagulation discussion as above.  Alcohol use  Quantity unclear.  States that she drinks 2 glasses of wine per week.  CIWA scores 0 overnight.  Continue thiamine.  As per daughter, patient drinks up to 2-3 small glasses of Chardonnay every night which are watered-down.    DVT prophylaxis: SCDs Code Status: Full Family Communication: Discussed in detail with patient's daughter via phone on 9/9, updated care and answered questions and towards the end of the conversation, phone abruptly got disconnected. Disposition: To be determined pending clinical improvement   Consultants:  Psychiatry  Procedures:  None  Antimicrobials:  IV ceftriaxone, completed 5 days course on 9/8   Subjective: Patient agitated this morning, yelled at sitter when threw her out of the room and refused her Haldol.  Grabbed RN stethoscope and attempted to pull it off her neck.  I interviewed and examined patient along with sitter at bedside.  Patient sleeping but easily arousable, oriented only to self and states that she is at home.  Reports feeling cold.  Denies any other complaints.  Denies feeling hungry or wanting breakfast.  Was  cooperative to simple questions and physical exam.  Objective:  Vitals:   12/08/18 0015 12/08/18 0538 12/08/18 1706 12/09/18 0000  BP: 137/67 (!) 162/85 (!) 165/78 (!) 142/64  Pulse: 80 90 81 74  Resp: 18 18 18    Temp: (!) 97.5 F (36.4 C) 97.7 F (36.5 C) 97.9 F (36.6 C)   TempSrc: Oral Oral Oral   SpO2: 96% 97% 97%   Weight:      Height:        Examination:  General exam: Elderly female, moderately built and nourished lying comfortably propped up in bed without distress. Respiratory system: Clear to auscultation. Respiratory effort normal. Cardiovascular system: S1 & S2 heard, RRR. No JVD, murmurs, rubs, gallops or clicks. No pedal edema.  Not on telemetry. Gastrointestinal system: Abdomen is nondistended, soft and nontender. No organomegaly or masses felt. Normal bowel sounds heard. Central nervous system: Mental status as noted above.. No focal neurological deficits. Extremities: Symmetric 5 x 5 power. Skin: No rashes, lesions or ulcers Psychiatry: Judgement and insight impaired. Mood & affect cannot be assessed at this time.     Data Reviewed: I have personally reviewed following labs and imaging studies  CBC: Recent Labs  Lab 12/05/18 1644 12/06/18 0814 12/07/18 0551 12/08/18 1009 12/09/18 0905  WBC 9.5 6.4 7.5 5.4 4.4  HGB 9.3* 7.8* 8.7* 8.6* 8.8*  HCT 26.6* 22.3* 25.8* 24.6* 26.2*  MCV 94.3  94.5 95.6 94.3 95.6  PLT 294 213 265 272 [id number]   Basic Metabolic Panel: Recent Labs  Lab 12/05/18 0728 12/06/18 0814 12/07/18 0551 12/08/18 1009 12/09/18 0905  NA 137 139 139 140 142  K 3.6 3.2* 3.1* 4.0 3.5  CL 103 106 102 105 107  CO2 24 23 26 24 24   GLUCOSE 105* 105* 97 135* 100*  BUN 25* 11 6* <5* <5*  CREATININE 1.14* 0.83 0.83 0.86 0.76  CALCIUM 8.8* 8.5* 8.5* 8.9 8.8*  MG  --  1.7 1.6* 1.7 1.7   Liver Function Tests: Recent Labs  Lab 12/04/18 1734  AST 24  ALT 23  ALKPHOS 56  BILITOT 0.4  PROT 6.0*  ALBUMIN 3.7    Cardiac Enzymes: No  results for input(s): CKTOTAL, CKMB, CKMBINDEX, TROPONINI in the last 168 hours.  CBG: Recent Labs  Lab 12/07/18 0557 12/07/18 1643 12/07/18 2110 12/08/18 0630  GLUCAP 97 113* 105* 119*    Recent Results (from the past 240 hour(s))  Urine Culture     Status: Abnormal   Collection Time: 12/04/18  8:07 PM   Specimen: Urine, Random  Result Value Ref Range Status   Specimen Description URINE, RANDOM  Final   Special Requests   Final    NONE Performed at [facility], [place] [address], [place], [place] [address]    Culture (A)  Final    30,000 COLONIES/mL MULTIPLE SPECIES PRESENT, SUGGEST RECOLLECTION   Report Status 12/06/2018 FINAL  Final  SARS Coronavirus 2 [facility] order, Performed in [facility] hospital lab) Nasopharyngeal Nasopharyngeal Swab     Status: None   Collection Time: 12/04/18 10:59 PM   Specimen: Nasopharyngeal Swab  Result Value Ref Range Status   SARS Coronavirus 2 NEGATIVE NEGATIVE Final    Comment: (NOTE) If result is NEGATIVE SARS-CoV-2 target nucleic acids are NOT DETECTED. The SARS-CoV-2 RNA is generally detectable in upper and lower  respiratory specimens during the acute phase of infection. The lowest  concentration of SARS-CoV-2 viral copies this assay can detect is 250  copies / mL. A negative result does not preclude SARS-CoV-2 infection  and should not be used as the sole basis for treatment or other  patient management decisions.  A negative result may occur with  improper specimen collection / handling, submission of specimen other  than nasopharyngeal swab, presence of viral mutation(s) within the  areas targeted by this assay, and inadequate number of viral copies  (<250 copies / mL). A negative result must be combined with clinical  observations, patient history, and epidemiological information. If result is POSITIVE SARS-CoV-2 target nucleic acids are DETECTED. The SARS-CoV-2 RNA is generally detectable in upper and lower  respiratory  specimens dur ing the acute phase of infection.  Positive  results are indicative of active infection with SARS-CoV-2.  Clinical  correlation with patient history and other diagnostic information is  necessary to determine patient infection status.  Positive results do  not rule out bacterial infection or co-infection with other viruses. If result is PRESUMPTIVE POSTIVE SARS-CoV-2 nucleic acids MAY BE PRESENT.   A presumptive positive result was obtained on the submitted specimen  and confirmed on repeat testing.  While 2019 novel coronavirus  (SARS-CoV-2) nucleic acids may be present in the submitted sample  additional confirmatory testing may be necessary for epidemiological  and / or clinical management purposes  to differentiate between  SARS-CoV-2 and other Sarbecovirus currently known to infect humans.  If clinically indicated additional testing  with an alternate test  methodology [phone]) is advised. The SARS-CoV-2 RNA is generally  detectable in upper and lower respiratory sp ecimens during the acute  phase of infection. The expected result is Negative. Fact Sheet for Patients:  StrictlyIdeas.no Fact Sheet for Healthcare Providers: BankingDealers.co.za This test is not yet approved or cleared by the [place] FDA and has been authorized for detection and/or diagnosis of SARS-CoV-2 by FDA under an Emergency Use Authorization (EUA).  This EUA will remain in effect (meaning this test can be used) for the duration of the COVID-19 declaration under Section 564(b)(1) of the Act, 21 U.S.C. section 360bbb-3(b)(1), unless the authorization is terminated or revoked sooner. Performed at [facility], [place] [address], [place], [place] [address]   Culture, blood (Routine X 2) w Reflex to ID Panel     Status: None (Preliminary result)   Collection Time: 12/05/18  7:17 AM   Specimen: BLOOD  Result Value Ref Range Status   Specimen  Description BLOOD LEFT ANTECUBITAL  Final   Special Requests   Final    BOTTLES DRAWN AEROBIC AND ANAEROBIC Blood Culture adequate volume   Culture   Final    NO GROWTH 4 DAYS Performed at [facility], [place] [address], [place], [place] [address]    Report Status PENDING  Incomplete  Culture, blood (Routine X 2) w Reflex to ID Panel     Status: None (Preliminary result)   Collection Time: 12/05/18  7:24 AM   Specimen: BLOOD LEFT ARM  Result Value Ref Range Status   Specimen Description BLOOD LEFT ARM  Final   Special Requests   Final    BOTTLES DRAWN AEROBIC AND ANAEROBIC Blood Culture adequate volume   Culture   Final    NO GROWTH 4 DAYS Performed at [facility], [place] [address] [place], [place] [address]    Report Status PENDING  Incomplete         Radiology Studies: No results found.      Scheduled Meds:  atorvastatin  20 mg Oral Daily   citalopram  10 mg Oral Daily   ferrous sulfate  325 mg Oral BID WC   folic acid  1 mg Oral Daily   levETIRAcetam  500 mg Oral BID   metoprolol tartrate  25 mg Oral QHS   metoprolol tartrate  50 mg Oral Daily   multivitamin with minerals  1 tablet Oral Daily   OLANZapine  5 mg Oral QHS   pantoprazole  40 mg Intravenous Q12H   sucralfate  1 g Oral TID WC & HS   thiamine  100 mg Oral Daily   Or   thiamine  100 mg Intravenous Daily   Continuous Infusions:  sodium chloride     sodium chloride 20 mL/hr at 12/06/18 0559   sodium chloride 1,000 mL (12/07/18 2229)   cefTRIAXone (ROCEPHIN)  IV 1 g (12/08/18 2036)     LOS: 5 days     Vernell Leep, MD, FACP, [facility]. [facility]  To contact the attending provider between 7A-7P or the covering provider during after hours 7P-7A, please log into the web site [email or web address] and access using universal  password for that web site. If you do not have the password, please call the hospital operator.  12/09/2018, 10:09 AM

## 2018-12-09 NOTE — Progress Notes (Signed)
Pt has been behaving appropriately all shift.  Confused to place and situation but easily redirectable.  Calm, cooperative and follows commands.  Daughter to visit this evening - hopefully patient is back to baseline mental status.

## 2018-12-10 LAB — CBC
HCT: 25 % — ABNORMAL LOW (ref 36.0–46.0)
Hemoglobin: 8.4 g/dL — ABNORMAL LOW (ref 12.0–15.0)
MCH: 32.1 pg (ref 26.0–34.0)
MCHC: 33.6 g/dL (ref 30.0–36.0)
MCV: 95.4 fL (ref 80.0–100.0)
Platelets: 338 10*3/uL (ref 150–400)
RBC: 2.62 MIL/uL — ABNORMAL LOW (ref 3.87–5.11)
RDW: 13.2 % (ref 11.5–15.5)
WBC: 6 10*3/uL (ref 4.0–10.5)
nRBC: 0 % (ref 0.0–0.2)

## 2018-12-10 LAB — GLUCOSE, CAPILLARY: Glucose-Capillary: 117 mg/dL — ABNORMAL HIGH (ref 70–99)

## 2018-12-10 MED ORDER — METOPROLOL TARTRATE 25 MG PO TABS: 25.0000 mg | ORAL_TABLET | ORAL | Status: DC

## 2018-12-10 MED ORDER — BACITRACIN-NEOMYCIN-POLYMYXIN OINTMENT TUBE
TOPICAL_OINTMENT | CUTANEOUS | Status: DC | PRN
  Administered 2018-12-10: 1 via TOPICAL
  Filled 2018-12-10: qty 14

## 2018-12-10 MED ORDER — FERROUS SULFATE 325 (65 FE) MG PO TABS: 325.0000 mg | ORAL_TABLET | Freq: Two times a day (BID) | ORAL | 0 refills | Status: AC

## 2018-12-10 MED ORDER — ADULT MULTIVITAMIN W/MINERALS CH: 1.0000 | ORAL_TABLET | Freq: Every day | ORAL | Status: AC

## 2018-12-10 MED ORDER — PANTOPRAZOLE SODIUM 40 MG PO TBEC: 40.0000 mg | DELAYED_RELEASE_TABLET | Freq: Every day | ORAL | 0 refills | Status: DC

## 2018-12-10 MED ORDER — THIAMINE HCL 100 MG PO TABS: 100.0000 mg | ORAL_TABLET | Freq: Every day | ORAL | 0 refills | Status: DC

## 2018-12-10 MED ORDER — GUAIFENESIN 100 MG/5ML PO SOLN
5.0000 mL | ORAL | Status: DC | PRN
  Administered 2018-12-10: 100 mg via ORAL
  Filled 2018-12-10: qty 5

## 2018-12-10 MED ORDER — FOLIC ACID 1 MG PO TABS: 1.0000 mg | ORAL_TABLET | Freq: Every day | ORAL | 0 refills | Status: AC

## 2018-12-10 NOTE — Plan of Care (Signed)
  Problem: Health Behavior/Discharge Planning: Goal: Ability to manage health-related needs will improve Outcome: Progressing   Problem: Clinical Measurements: Goal: Ability to maintain clinical measurements within normal limits will improve Outcome: Progressing Goal: Will remain free from infection Outcome: Progressing   Problem: Activity: Goal: Risk for activity intolerance will decrease Outcome: Progressing  Pt is able to ambulate & move around in bed pretty well  Problem: Coping: Goal: Level of anxiety will decrease Outcome: Progressing  Pt has been less anxious & is more alert today. Pt has been calm & cooperative all night.  Problem: Safety: Goal: Ability to remain free from injury will improve Outcome: Progressing  Pt has a bed alarm on & pt's dgtr is at the bedside with pt.

## 2018-12-10 NOTE — Discharge Summary (Signed)
Physician Discharge Summary  Kim Blankenship [id number] DOB: [birth year]  PCP: Gregor Hams, FNP  Admitted from: Home Discharged to: Home  Admit date: 12/04/2018 Discharge date: 12/10/2018  Recommendations for Outpatient Follow-up:   Follow-up Information    Gregor Hams, FNP. Schedule an appointment as soon as possible for a visit in 1 week(s).   Specialty: Family Medicine Why: To be seen with repeat labs (CBC & BMP). Contact information: [place] [address] [phone]          Recommend close outpatient follow-up of reported microscopic hematuria.  Consider repeating urine microscopy in a couple of weeks and if persists then consider further outpatient evaluation with urology.  Home Health: None Equipment/Devices: None  Discharge Condition: Improved and stable CODE STATUS: Full Diet recommendation: Heart healthy diet  Discharge Diagnoses:  Principal Problem:   Delirium Active Problems:   COPD (chronic obstructive pulmonary disease) (HCC)   Seizure (HCC)   PAF (paroxysmal atrial fibrillation) (HCC)   EtOH dependence (HCC)   Essential hypertension   UTI (urinary tract infection)   HLD (hyperlipidemia)   GERD (gastroesophageal reflux disease)   Depression   AKI (acute kidney injury) ([place])   Stroke (cerebrum) (HCC)   GIB (gastrointestinal bleeding)   Chronic diastolic CHF (congestive heart failure) (HCC)   Brief Summary: 73 year old female, widowed, lives with her daughter and her daughter's spouse, PMH of HTN, HLD, COPD, depression, A. fib not on anticoagulation, chronic diastolic CHF, seizure disorder?  Related to previous episode of PRES, alcohol use presented to the hospital with hematemesis in the context of taking at least 20 tablets of ibuprofen weekly at home.  She was Hemoccult positive and had approximately 3 g hemoglobin drop.  Hospital course complicated by acute agitation/delirium for which psychiatry consulted.  Mild  AKI resolved, being treated for suspected UTI, [facility] consulted.   Assessment & Plan:  Agitated delirium, persistent  As per daughter, at baseline apart from mild memory deficit issues, is quite coherent.  She did not have any mental status changes while she left her home and even the first day in the hospital.  Etiology unclear but suspect multifactorial, some alcohol withdrawal, UTI, sleep deprivation (had not slept for 3 days PTA), acute illness and hospital delirium complicating underlying mild dementia.  As per chart review, has had Risperdal with minimal effect,  PRN Haldol use, soft restraintsf.  Has 1: 1 Air cabin crew.Marland Kitchen  Psychiatry consultation 9/8 appreciated and as per recommendations was started on Zyprexa 5 mg at bedtime and consider adding Zyprexa 5 mg in the morning if agitation and delirium persist.  CIWA protocol benzodiazepines were discontinued.  No focal deficits and patient not cooperating with labs or procedures and hence held off on brain imaging and was not felt indicated.  Continued home dose of Celexa, Zyprexa at bedtime, thiamine supplements and as needed Haldol  EKG 9/9 showed QTC 442 ms.  Delirium has now resolved for >24 hours and patient's mental status is back to baseline.  Since this was an acute presentation and felt to be multifactorial as noted above, do not see need for continued use of Zyprexa which will be discontinued at discharge.  Patient advised regarding adequate sleep hygiene, minimize or completely stop alcohol use.  She verbalized understanding.  Acute upper GI bleed  Presented with hematemesis and melena.  Suspect due to NSAID use and may have underlying gastritis, ulcer disease versus other etiologies.  Bleeding has clinically stopped.  Treated in the  hospital with Protonix 40 mg IV twice daily and sucralfate.  I discussed with Dr. Cristina Gong who evaluated the patient again today.  Please refer to his detailed note from today.   Patient prefers not to have evaluation of her upper GI tract with her endoscopic or radiographic at present time.  He recommends avoiding NSAIDs, PPI once daily (likely long-term or indefinitely) and verified with him and advise [place] to resume aspirin 81 mg daily.  Patient does have iron deficiency anemia and hence recommend outpatient consideration for further evaluation including screening colonoscopy if one not performed recently +/- EGD.  Acute blood loss anemia  Hemoglobin 13.4 on 04/04/2018.  Presented with hemoglobin of 10.2 which is gradually drifted down but stable in the mid 8 g range since 9/7.   Suspect due to acute upper GI bleed.    Hemoglobin stable in the mid 8 g range.  Acute kidney injury  Baseline creatinine 0.79.  Presented with creatinine of 1.66.  Resolved.  Avoid nephrotoxic's.  Follow BMP as outpatient.  Suspected UTI  Urine culture not helpful i.e. 30,000 colonies of multiple species.  Blood cultures x2: Negative to date  Has completed 5 days of IV ceftriaxone, discontinued ceftriaxone after 9/8 dose.  Patient reports that she has seen PCP/?  Urology for history of blood in urine for the last couple of weeks and claims that she underwent some procedure.  Close outpatient follow-up of microscopic hematuria.  Hypomagnesemia/hypokalemia  Replaced.  Paroxysmal A. Fib  Continue metoprolol.  chadVASCs 5.  However unable to do anticoagulation given acute GI bleed, may follow-up outpatient with her PCP for further decisions.  As per brief chart review, it appears that she has not been on anticoagulation due to issues with noncompliance and prior hypertensive crisis  Can be reassessed as outpatient regarding appropriateness for starting anticoagulation.  Chronic diastolic CHF  TTE with LVEF 55% and grade 1 diastolic dysfunction.  Clinically euvolemic.  Resume prior home dose of Aldactone at discharge.  Seizure disorder  Continue Keppra.  No  reported seizures.  Per daughter, patient has not had a seizure in a long time.  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  Outpatient follow-up with neurology/PCP regarding need for continued use or if can be discontinued.  Essential hypertension  Mildly uncontrolled.  Continue current metoprolol.  Resume Aldactone.  GERD  Continue PPI.  COPD  Stable without clinical bronchospasm  Depression  Continue Celexa.  Hyperlipidemia  Continue statins.  History of CVA  Home aspirin was held due to GI bleeding.  Anticoagulation discussion as above.  After discussing with [facility] today, resumed aspirin at discharge.  Alcohol use  Patient confirms that she drinks maybe 8 ounces or 10 ounces of [place].  No overt withdrawal  Continue thiamine.   Consultants:  Psychiatry [facility]  Procedures:  None   Discharge Instructions  Discharge Instructions    Call MD for:   Complete by: As directed    Recurrent vomiting blood or coffee-ground material or passing blood or black tarry stools.  Recurrent confusion or altered mental status.   Call MD for:  difficulty breathing, headache or visual disturbances   Complete by: As directed    Call MD for:  extreme fatigue   Complete by: As directed    Call MD for:  persistant dizziness or light-headedness   Complete by: As directed    Call MD for:  persistant nausea and vomiting   Complete by: As directed  Call MD for:  severe uncontrolled pain   Complete by: As directed    Diet - low sodium heart healthy   Complete by: As directed    Increase activity slowly   Complete by: As directed        Medication List    TAKE these medications   albuterol (5 MG/ML) 0.5% nebulizer solution Commonly known as: PROVENTIL Take 2.5 mg by nebulization every 6 (six) hours as needed for wheezing.   aspirin EC 81 MG tablet Take 81 mg by mouth daily.   atorvastatin 20 MG tablet Commonly known as:  LIPITOR Take 20 mg by mouth daily.   citalopram 10 MG tablet Commonly known as: CELEXA Take 10 mg by mouth daily.   ferrous sulfate 325 (65 FE) MG tablet Take 1 tablet (325 mg total) by mouth 2 (two) times daily with a meal.   folic acid 1 MG tablet Commonly known as: FOLVITE Take 1 tablet (1 mg total) by mouth daily. Start taking on: December 11, 2018   levETIRAcetam 500 MG tablet Commonly known as: KEPPRA Take 1 tablet (500 mg total) by mouth 2 (two) times daily.   metoprolol tartrate 25 MG tablet Commonly known as: LOPRESSOR Take 1-2 tablets (25-50 mg total) by mouth See admin instructions. Take 2 tablets every morning and take 1 tablet in the evening   multivitamin with minerals Tabs tablet Take 1 tablet by mouth daily. Start taking on: December 11, 2018   pantoprazole 40 MG tablet Commonly known as: Protonix Take 1 tablet (40 mg total) by mouth daily.   spironolactone 25 MG tablet Commonly known as: ALDACTONE Take 25 mg by mouth daily.   thiamine 100 MG tablet Take 1 tablet (100 mg total) by mouth daily. Start taking on: December 11, 2018      Allergies  Allergen Reactions  . Tape Other (See Comments)    THE PATIENT'S SKIN IS THIN AND TEARS VERY EASILY (she "picks" at it; please use coban wrap)      Procedures/Studies: Ct Abdomen Pelvis W Contrast  Result Date: 12/04/2018 CLINICAL DATA:  Abdominal pain, anal and vaginal bleeding EXAM: CT ABDOMEN AND PELVIS WITH CONTRAST TECHNIQUE: Multidetector CT imaging of the abdomen and pelvis was performed using the standard protocol following bolus administration of intravenous contrast. CONTRAST:  42mL OMNIPAQUE IOHEXOL 300 MG/ML  SOLN COMPARISON:  None. FINDINGS: Lower chest: Lung bases demonstrate no acute consolidation or effusion. Heart size within normal limits. Hepatobiliary: No focal liver abnormality is seen. No gallstones, gallbladder wall thickening, or biliary dilatation. Pancreas: Atrophic.  No  inflammatory change Spleen: Normal in size without focal abnormality. Adrenals/Urinary Tract: Adrenal glands are unremarkable. Kidneys are normal, without renal calculi, focal lesion, or hydronephrosis. Bladder is unremarkable. Stomach/Bowel: Stomach is within normal limits. Appendix appears normal. No evidence of bowel wall thickening, distention, or inflammatory changes. Vascular/Lymphatic: Moderate aortic atherosclerosis. No aneurysm. No significantly enlarged lymph nodes. Reproductive: Uterus and bilateral adnexa are unremarkable. Other: Negative for free air or free fluid. Musculoskeletal: Right hip replacement. Scoliosis of the spine. Moderate compression deformity T12 with about 40% loss of vertebral body height. Mild inferior endplate deformity with anterior wedging at L1. Mild superior endplate deformity at L4. IMPRESSION: 1. No CT evidence for acute intra-abdominal or pelvic abnormality. 2. Multiple compression deformities of the thoracolumbar spine of uncertain age Electronically Signed   By: Donavan Foil M.D.   On: 12/04/2018 22:07      Subjective: Patient interviewed and examined with RN in room.  Denies complaints.  Anxious to go home.  No vomiting, hematemesis or melena reported.  As per RN, mental status changes have resolved for more than 24 hours.  Discharge Exam:  Vitals:   12/09/18 1935 12/09/18 2018 12/10/18 0426 12/10/18 0545  BP: 130/71   113/61  Pulse: 66   86  Resp: 18   17  Temp: 98.1 F (36.7 C)   98 F (36.7 C)  TempSrc: Oral   Oral  SpO2: 98% 95% 96% 95%  Weight:    65 kg  Height:        General exam: Elderly female, moderately built and nourished lying comfortably propped up in bed without distress. Respiratory system: Clear to auscultation. Respiratory effort normal. Cardiovascular system: S1 & S2 heard, RRR. No JVD, murmurs, rubs, gallops or clicks. No pedal edema.   Gastrointestinal system: Abdomen is nondistended, soft and nontender. No organomegaly or  masses felt. Normal bowel sounds heard. Central nervous system:  Alert and oriented x3. No focal neurological deficits. Extremities: Symmetric 5 x 5 power. Skin: No rashes, lesions or ulcers Psychiatry: Judgement and insight intact. Mood & affect pleasant and appropriate at this time.      The results of significant diagnostics from this hospitalization (including imaging, microbiology, ancillary and laboratory) are listed below for reference.     Microbiology: Recent Results (from the past 240 hour(s))  Urine Culture     Status: Abnormal   Collection Time: 12/04/18  8:07 PM   Specimen: Urine, Random  Result Value Ref Range Status   Specimen Description URINE, RANDOM  Final   Special Requests   Final    NONE Performed at [facility], [address] [place], [place] [address]    Culture (A)  Final    30,000 COLONIES/mL MULTIPLE SPECIES PRESENT, SUGGEST RECOLLECTION   Report Status 12/06/2018 FINAL  Final  SARS Coronavirus 2 [facility] order, Performed in [facility] hospital lab) Nasopharyngeal Nasopharyngeal Swab     Status: None   Collection Time: 12/04/18 10:59 PM   Specimen: Nasopharyngeal Swab  Result Value Ref Range Status   SARS Coronavirus 2 NEGATIVE NEGATIVE Final    Comment: (NOTE) If result is NEGATIVE SARS-CoV-2 target nucleic acids are NOT DETECTED. The SARS-CoV-2 RNA is generally detectable in upper and lower  respiratory specimens during the acute phase of infection. The lowest  concentration of SARS-CoV-2 viral copies this assay can detect is 250  copies / mL. A negative result does not preclude SARS-CoV-2 infection  and should not be used as the sole basis for treatment or other  patient management decisions.  A negative result may occur with  improper specimen collection / handling, submission of specimen other  than nasopharyngeal swab, presence of viral mutation(s) within the  areas targeted by this assay, and inadequate number of viral copies   (<250 copies / mL). A negative result must be combined with clinical  observations, patient history, and epidemiological information. If result is POSITIVE SARS-CoV-2 target nucleic acids are DETECTED. The SARS-CoV-2 RNA is generally detectable in upper and lower  respiratory specimens dur ing the acute phase of infection.  Positive  results are indicative of active infection with SARS-CoV-2.  Clinical  correlation with patient history and other diagnostic information is  necessary to determine patient infection status.  Positive results do  not rule out bacterial infection or co-infection with other viruses. If result is PRESUMPTIVE POSTIVE SARS-CoV-2 nucleic acids MAY BE PRESENT.   A presumptive positive result was  obtained on the submitted specimen  and confirmed on repeat testing.  While 2019 novel coronavirus  (SARS-CoV-2) nucleic acids may be present in the submitted sample  additional confirmatory testing may be necessary for epidemiological  and / or clinical management purposes  to differentiate between  SARS-CoV-2 and other Sarbecovirus currently known to infect humans.  If clinically indicated additional testing with an alternate test  methodology [phone]) is advised. The SARS-CoV-2 RNA is generally  detectable in upper and lower respiratory sp ecimens during the acute  phase of infection. The expected result is Negative. Fact Sheet for Patients:  StrictlyIdeas.no Fact Sheet for Healthcare Providers: BankingDealers.co.za This test is not yet approved or cleared by the [place] FDA and has been authorized for detection and/or diagnosis of SARS-CoV-2 by FDA under an Emergency Use Authorization (EUA).  This EUA will remain in effect (meaning this test can be used) for the duration of the COVID-19 declaration under Section 564(b)(1) of the Act, 21 U.S.C. section 360bbb-3(b)(1), unless the authorization is terminated  or revoked sooner. Performed at [facility], [place] [address] [place], [place] [address]   Culture, blood (Routine X 2) w Reflex to ID Panel     Status: None (Preliminary result)   Collection Time: 12/05/18  7:17 AM   Specimen: BLOOD  Result Value Ref Range Status   Specimen Description BLOOD LEFT ANTECUBITAL  Final   Special Requests   Final    BOTTLES DRAWN AEROBIC AND ANAEROBIC Blood Culture adequate volume   Culture   Final    NO GROWTH 4 DAYS Performed at [facility], [place] [address], [place], [place] [address]    Report Status PENDING  Incomplete  Culture, blood (Routine X 2) w Reflex to ID Panel     Status: None (Preliminary result)   Collection Time: 12/05/18  7:24 AM   Specimen: BLOOD LEFT ARM  Result Value Ref Range Status   Specimen Description BLOOD LEFT ARM  Final   Special Requests   Final    BOTTLES DRAWN AEROBIC AND ANAEROBIC Blood Culture adequate volume   Culture   Final    NO GROWTH 4 DAYS Performed at [facility], [place] [address], [place], [place] [address]    Report Status PENDING  Incomplete     Labs: CBC: Recent Labs  Lab 12/06/18 0814 12/07/18 0551 12/08/18 1009 12/09/18 0905 12/10/18 0521  WBC 6.4 7.5 5.4 4.4 6.0  HGB 7.8* 8.7* 8.6* 8.8* 8.4*  HCT 22.3* 25.8* 24.6* 26.2* 25.0*  MCV 94.5 95.6 94.3 95.6 95.4  PLT 213 265 272 323 [id number]   Basic Metabolic Panel: Recent Labs  Lab 12/05/18 0728 12/06/18 0814 12/07/18 0551 12/08/18 1009 12/09/18 0905  NA 137 139 139 140 142  K 3.6 3.2* 3.1* 4.0 3.5  CL 103 106 102 105 107  CO2 24 23 26 24 24   GLUCOSE 105* 105* 97 135* 100*  BUN 25* 11 6* <5* <5*  CREATININE 1.14* 0.83 0.83 0.86 0.76  CALCIUM 8.8* 8.5* 8.5* 8.9 8.8*  MG  --  1.7 1.6* 1.7 1.7   Liver Function Tests: Recent Labs  Lab 12/04/18 1734  AST 24  ALT 23  ALKPHOS 56  BILITOT 0.4  PROT 6.0*  ALBUMIN 3.7   BNP (last 3 results) Recent Labs    12/05/18 0728  BNP 28.5   Cardiac Enzymes: No results for  input(s): CKTOTAL, CKMB, CKMBINDEX, TROPONINI in the last 168 hours. CBG: Recent Labs  Lab 12/07/18 0557 12/07/18 1643 12/07/18 2110 12/08/18 0630 12/10/18 0611  GLUCAP 97 113* 105* 119* 117*   Hgb A1c No results for input(s): HGBA1C in the last 72 hours. Lipid Profile No results for input(s): CHOL, HDL, LDLCALC, TRIG, CHOLHDL, LDLDIRECT in the last 72 hours. Thyroid function studies Recent Labs    12/08/18 1009  TSH 1.565   Anemia work up No results for input(s): VITAMINB12, FOLATE, FERRITIN, TIBC, IRON, RETICCTPCT in the last 72 hours. Urinalysis    Component Value Date/Time   COLORURINE YELLOW 12/04/2018 2026   APPEARANCEUR HAZY (A) 12/04/2018 2026   LABSPEC 1.021 12/04/2018 2026   PHURINE 5.0 12/04/2018 2026   GLUCOSEU NEGATIVE 12/04/2018 2026   HGBUR MODERATE (A) 12/04/2018 2026   BILIRUBINUR NEGATIVE 12/04/2018 2026   KETONESUR NEGATIVE 12/04/2018 2026   PROTEINUR NEGATIVE 12/04/2018 2026   NITRITE NEGATIVE 12/04/2018 2026   LEUKOCYTESUR LARGE (A) 12/04/2018 2026      Time coordinating discharge: 45 minutes  SIGNED:  Vernell Leep, MD, FACP, [facility]. [facility]  To contact the attending provider between 7A-7P or the covering provider during after hours 7P-7A, please log into the web site [email or web address] and access using universal [place] password for that web site. If you do not have the password, please call the hospital operator.

## 2018-12-10 NOTE — Progress Notes (Signed)
I was contacted by the patient's attending hospitalist to reconsider endoscopic evaluation, now that the patient's delirium has cleared.  Bottom line: The patient prefers NOT to have evaluation of her upper GI tract, whether endoscopic or radiographic, at the present time.  Discussion: I am satisfied that the patient is "out of the woods" for all intents and purposes, with respect to risk of GI bleeding, as long as she remains on PPI therapy and avoids nonsteroidal anti-inflammatory drugs.  As previously discussed, I would recommend twice daily PPI dosing while in the hospital, and when she goes home, once daily dosing should be sufficient.  However, there is a lingering question about possible gastric malignancy, because the patient has lost about 30 pounds over the past 3 to 4 months, by her report.    She indicates she had previously gained a lot of weight, and that therefore, the initial part of this weight loss was intentional.  However, then she began to feel weak and did not feel much like eating.  She does not really give symptoms of early satiety.    On balance, I think it is very improbable that this patient is harboring a gastric malignancy, but on the other hand, even allowing for her COPD and age, I think that the rapidity and amount of weight loss exceeds that which I feel comfortable attributing simply to intentional weight loss.  Therefore, I did recommend to the patient that she have evaluation of her upper tract.    Concerning her pulmonary status, she is not on home oxygen and she says she can walk about a block under optimal conditions, so I think she would be at reasonable risk for anesthesia for an upper endoscopy.  I also offered her the option of an upper GI series, which of course would require no sedation and pose no risk to her.    However, after all this discussion, the patient elected not to have evaluation at this time.  She really does not think there is anything wrong  with her stomach in terms of malignancy, and will keep an eye on her weight over time, and she has my card if she has progressive weight loss or decides that she wants to pursue GI tract evaluation.  We will sign off, but again would be very happy to discuss this patient's case in more detail with you or to see her again, if you so request.  Cleotis Nipper, M.D. Pager [phone] If no answer or after 5 PM call [phone]

## 2018-12-10 NOTE — Progress Notes (Signed)
Called pts daughter [name] to notify that pt was on her way home with transport.

## 2018-12-10 NOTE — Progress Notes (Signed)
Pt provided with discharge instructions.  Dicussed all medications, follow up with PCP and when to seek help.  New medications sent to [facility] in [place].  All questions answered.  Awaiting [facility] for transport.

## 2018-12-10 NOTE — Discharge Instructions (Signed)

## 2018-12-10 NOTE — Progress Notes (Signed)
[facility] has arrived to pick up patient.

## 2018-12-10 NOTE — Progress Notes (Signed)
[facility] was called for patient for soonest available transport home to [address] [place], [place] [address].   No further needs identified at this time.   [place], [place]

## 2018-12-14 LAB — CULTURE, BLOOD (ROUTINE X 2)
Culture: NO GROWTH
Culture: NO GROWTH
Special Requests: ADEQUATE
Special Requests: ADEQUATE

## 2019-04-16 ENCOUNTER — Encounter (HOSPITAL_COMMUNITY): Payer: Self-pay | Admitting: Emergency Medicine

## 2019-04-16 ENCOUNTER — Emergency Department (HOSPITAL_COMMUNITY)
Admission: EM | Admit: 2019-04-16 | Discharge: 2019-04-17 | Disposition: A | Discharge status: Home / Self Care | Payer: Medicare Other | Attending: Emergency Medicine | Admitting: Emergency Medicine

## 2019-04-16 ENCOUNTER — Other Ambulatory Visit: Payer: Self-pay

## 2019-04-16 DIAGNOSIS — N903 Dysplasia of vulva, unspecified: Secondary | ICD-10-CM | POA: Diagnosis not present

## 2019-04-16 DIAGNOSIS — R195 Other fecal abnormalities: Secondary | ICD-10-CM | POA: Diagnosis not present

## 2019-04-16 DIAGNOSIS — R42 Dizziness and giddiness: Secondary | ICD-10-CM | POA: Insufficient documentation

## 2019-04-16 DIAGNOSIS — Z79899 Other long term (current) drug therapy: Secondary | ICD-10-CM | POA: Diagnosis not present

## 2019-04-16 DIAGNOSIS — I10 Essential (primary) hypertension: Secondary | ICD-10-CM | POA: Diagnosis not present

## 2019-04-16 DIAGNOSIS — R102 Pelvic and perineal pain: Secondary | ICD-10-CM | POA: Diagnosis not present

## 2019-04-16 DIAGNOSIS — I4891 Unspecified atrial fibrillation: Secondary | ICD-10-CM | POA: Insufficient documentation

## 2019-04-16 LAB — BASIC METABOLIC PANEL
Anion gap: 8 (ref 5–15)
BUN: 15 mg/dL (ref 8–23)
CO2: 26 mmol/L (ref 22–32)
Calcium: 9.5 mg/dL (ref 8.9–10.3)
Chloride: 102 mmol/L (ref 98–111)
Creatinine, Ser: 0.97 mg/dL (ref 0.44–1.00)
GFR calc Af Amer: >60 mL/min (ref >60)
GFR calc non Af Amer: 59 mL/min — ABNORMAL LOW (ref >60)
Glucose, Bld: 99 mg/dL (ref 70–99)
Potassium: 4.4 mmol/L (ref 3.5–5.1)
Sodium: 136 mmol/L (ref 135–145)

## 2019-04-16 LAB — CBC
HCT: 45.3 % (ref 36.0–46.0)
Hemoglobin: 14.7 g/dL (ref 12.0–15.0)
MCH: 30.6 pg (ref 26.0–34.0)
MCHC: 32.5 g/dL (ref 30.0–36.0)
MCV: 94.2 fL (ref 80.0–100.0)
Platelets: 303 10*3/uL (ref 150–400)
RBC: 4.81 MIL/uL (ref 3.87–5.11)
RDW: 13.1 % (ref 11.5–15.5)
WBC: 7.6 10*3/uL (ref 4.0–10.5)
nRBC: 0 % (ref 0.0–0.2)

## 2019-04-16 MED ORDER — SODIUM CHLORIDE 0.9% FLUSH: 3.0000 mL | Freq: Once | INTRAVENOUS | Status: DC

## 2019-04-16 NOTE — ED Notes (Signed)
Daughter [name] called upset because mom has been placed in the waiting room and she states she has cancer and is very ill.  She would like someone to turn her wifi on so she can call her--I alerted Triage nurse Merril Abbe

## 2019-04-16 NOTE — ED Triage Notes (Signed)
Pt brought to ED by [facility] from home for c/o dizziness and some sore on her vagina that pt states it could be cancer cells. Per pt she has hx of vertigo she really doesn't know when is the LSW time. Pt is AO x4 no neuro deficit noticed. BP 126/80, HR 98, R 18, 96% RA

## 2019-04-17 ENCOUNTER — Emergency Department (HOSPITAL_COMMUNITY): Payer: Medicare Other

## 2019-04-17 MED ORDER — HYDROCODONE-ACETAMINOPHEN 5-325 MG PO TABS: 1.0000 | ORAL_TABLET | Freq: Three times a day (TID) | ORAL | 0 refills | Status: AC | PRN

## 2019-04-17 MED ORDER — IOHEXOL 300 MG/ML  SOLN
100.0000 mL | Freq: Once | INTRAMUSCULAR | Status: AC | PRN
  Administered 2019-04-17: 04:00:00 100 mL via INTRAVENOUS

## 2019-04-17 MED ORDER — LIDOCAINE 5 % EX OINT: 1.0000 "application " | TOPICAL_OINTMENT | CUTANEOUS | 0 refills | Status: DC | PRN

## 2019-04-17 MED ORDER — SODIUM CHLORIDE 0.9 % IV BOLUS
500.0000 mL | Freq: Once | INTRAVENOUS | Status: AC
  Administered 2019-04-17: 02:00:00 500 mL via INTRAVENOUS

## 2019-04-17 MED ORDER — ACETAMINOPHEN 500 MG PO TABS: 1000.0000 mg | ORAL_TABLET | Freq: Once | ORAL | Status: DC

## 2019-04-17 MED ORDER — HYDROCODONE-ACETAMINOPHEN 5-325 MG PO TABS: 1.0000 | ORAL_TABLET | Freq: Three times a day (TID) | ORAL | 0 refills | Status: DC | PRN

## 2019-04-17 NOTE — ED Notes (Signed)
Patient's daughter called wanting an update, ED secretary transferred daughter to Dr. Leonette Monarch who provided update on patient.

## 2019-04-17 NOTE — ED Notes (Signed)
[phone] pts daughter [name] would like an update

## 2019-04-17 NOTE — ED Notes (Signed)
Patient transported to CT 

## 2019-04-17 NOTE — ED Notes (Signed)
Pt requesting that this RN contact her daughter prior to her leaving ED due to some questions that her daughter has. This RN contacted patient's daughter [name] who was very upset that her mother was being discharged from the ED This RN had Dr. Leonette Monarch speak with daughter in regards to patient's disposition status.

## 2019-04-17 NOTE — ED Notes (Signed)
Patient provided taxi voucher per MDs request after speaking with patient's daughter who advised they had no way to get patient home

## 2019-04-17 NOTE — ED Provider Notes (Signed)
[facility] EMERGENCY DEPARTMENT Provider Note  CSN: [id number] Arrival date & time: 04/16/19 2132  Chief Complaint(s) Dizziness  HPI Kim Blankenship is a 74 y.o. female with an extensive past medical history listed below who presents to the emergency department with vulvar pain.  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  She reports worsening pain for the past several months.  Has had scheduled appointments but has missed him.  Pain is worse with palpation and urination.  She relates the pain to return of lesions.  Patient also endorses decreased oral intake and hydration.  No abdominal pain, fevers, chills.  I spoke with the daughter who reported that the patient is also had bloody bowel movements.  Daughter also reported that the patient refuses to go to her appointments.  She also reports that they have difficulty getting her to her appointments due to lack of transportation.  She reports that she has spoken with her primary care provider who recommended she be admitted to the hospital for immediate work-up.  Daughter also reports that the patient is scheduled to leave town in 1 month and needs to have this work-up completed prior to her leaving.   HPI  Past Medical History Past Medical History:  Diagnosis Date  . Asthma   . Atrial fibrillation ([place])   . COPD (chronic obstructive pulmonary disease) ([place])   . Hypertension   . Irregular heart rate   . PRES (posterior reversible encephalopathy syndrome)   . Stroke [facility])    Patient Active Problem List   Diagnosis Date Noted  . Delirium   . GIB (gastrointestinal bleeding) 12/04/2018  . Chronic diastolic CHF (congestive heart failure) ([place]) 12/04/2018  . 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 AKI (acute kidney injury) ([place]) 11/21/2016  . Stroke (cerebrum) ([place]) 11/21/2016  . Hyponatremia 11/20/2016  . UTI  (urinary tract infection) 11/20/2016  . HLD (hyperlipidemia) 11/20/2016  . GERD (gastroesophageal reflux disease) 11/20/2016  . Depression 11/20/2016  . Hypertensive emergency 11/04/2016  . EtOH dependence ([place]) 11/04/2016  . Hyperglycemia 11/04/2016  . Essential hypertension   . Hypertensive urgency 10/30/2016  . PAF (paroxysmal atrial fibrillation) ([place]) 10/30/2016  . PRES (posterior reversible encephalopathy syndrome) 10/30/2016  . Seizure ([place])   . Acute blood loss anemia   . Closed fracture of right hip ([place]) 02/09/2016  . Chest pain 11/07/2015  . COPD (chronic obstructive pulmonary disease) ([place]) 08/19/2013  . Abnormal EKG 08/19/2013  . Fever, unspecified 08/19/2013   Home Medication(s) Prior to Admission medications   Medication Sig Start Date End Date Taking? Authorizing Provider  albuterol (PROVENTIL) (5 MG/ML) 0.5% nebulizer solution Take 2.5 mg by nebulization every 6 (six) hours as needed for wheezing.  07/15/18   [provider]  aspirin EC 81 MG tablet Take 81 mg by mouth daily.    [provider]  atorvastatin (LIPITOR) 20 MG tablet Take 20 mg by mouth daily. 04/08/16   [provider]  citalopram (CELEXA) 10 MG tablet Take 10 mg by mouth daily.    [provider]  ferrous sulfate 325 (65 FE) MG tablet Take 1 tablet (325 mg total) by mouth 2 (two) times daily with a meal. 12/10/18   Hongalgi, Lenis Dickinson, MD  folic acid (FOLVITE) 1 MG tablet Take 1 tablet (1 mg total) by mouth daily. 12/11/18   Hongalgi, Lenis Dickinson, MD  HYDROcodone-acetaminophen (NORCO/VICODIN) 5-325 MG tablet Take  1 tablet by mouth every 8 (eight) hours as needed for up to 3 days for severe pain (That is not improved by your scheduled acetaminophen regimen). Please do not exceed 4000 mg of acetaminophen (Tylenol) a 24-hour period. Please note that he may be prescribed additional medicine that contains acetaminophen. 04/17/19 04/20/19  Fatima Blank, MD  levETIRAcetam  (KEPPRA) 500 MG tablet Take 1 tablet (500 mg total) by mouth 2 (two) times daily. 11/06/16   Florencia Reasons, MD  lidocaine (XYLOCAINE) 5 % ointment Apply 1 application topically as needed. 04/17/19   Fatima Blank, MD  metoprolol tartrate (LOPRESSOR) 25 MG tablet Take 1-2 tablets (25-50 mg total) by mouth See admin instructions. Take 2 tablets every morning and take 1 tablet in the evening 12/10/18   Hongalgi, Lenis Dickinson, MD  Multiple Vitamin (MULTIVITAMIN WITH MINERALS) TABS tablet Take 1 tablet by mouth daily. 12/11/18   Hongalgi, Lenis Dickinson, MD  pantoprazole (PROTONIX) 40 MG tablet Take 1 tablet (40 mg total) by mouth daily. 12/10/18 01/09/19  Modena Jansky, MD  spironolactone (ALDACTONE) 25 MG tablet Take 25 mg by mouth daily.    [provider]  thiamine 100 MG tablet Take 1 tablet (100 mg total) by mouth daily. 12/11/18   Modena Jansky, MD                                                                                                                                    Past Surgical History Past Surgical History:  Procedure Laterality Date  . CESAREAN SECTION    . Left Foot Reconstruction    . OPEN REDUCTION INTERNAL FIXATION (ORIF) METACARPAL Right 03/10/2015   Procedure: OPEN REDUCTION INTERNAL FIXATION (ORIF) RIGHT LONG  METACARPAL FRACTURE;  Surgeon: Leanora Cover, MD;  Location: [facility];  Service: Orthopedics;  Laterality: Right;  . TONSILLECTOMY    . TOTAL HIP ARTHROPLASTY Right 02/09/2016   Procedure: TOTAL HIP ARTHROPLASTY ANTERIOR APPROACH;  Surgeon: Frederik Pear, MD;  Location: [facility];  Service: Orthopedics;  Laterality: Right;   Family History Family History  Problem Relation Age of Onset  . Dementia Father     Social History Social History   Tobacco Use  . Smoking status: Former Smoker    Packs/day: 2.00    Years: 30.00    Pack years: 60.00    Types: Cigarettes    Quit date: 04/1995    Years since quitting: 24.0  . Smokeless tobacco: Never  Used  Substance Use Topics  . Alcohol use: Yes    Comment: wine daily  . Drug use: No   Allergies Tape  Review of Systems Review of Systems All other systems are reviewed and are negative for acute change except as noted in the HPI  Physical Exam Vital Signs  I have reviewed the triage vital signs BP (!) 154/68   Pulse 63   Temp 98 F (36.7 C) (  Oral)   Resp 15   Ht 5\' 3"  (1.6 m)   Wt 59 kg   SpO2 96%   BMI 23.03 kg/m   Physical Exam Vitals reviewed.  Constitutional:      General: She is not in acute distress.    Appearance: She is well-developed. She is not diaphoretic.  HENT:     Head: Normocephalic and atraumatic.     Right Ear: External ear normal.     Left Ear: External ear normal.     Nose: Nose normal.  Eyes:     General: No scleral icterus.    Conjunctiva/sclera: Conjunctivae normal.  Neck:     Trachea: Phonation normal.  Cardiovascular:     Rate and Rhythm: Normal rate and regular rhythm.  Pulmonary:     Effort: Pulmonary effort is normal. No respiratory distress.     Breath sounds: No stridor.  Abdominal:     General: There is no distension.  Genitourinary:   Musculoskeletal:        General: Normal range of motion.     Cervical back: Normal range of motion.  Neurological:     Mental Status: She is alert and oriented to person, place, and time.  Psychiatric:        Behavior: Behavior normal.     ED Results and Treatments Labs (all labs ordered are listed, but only abnormal results are displayed) Labs Reviewed  BASIC METABOLIC PANEL - Abnormal; Notable for the following components:      Result Value   GFR calc non Af Amer 59 (*)    All other components within normal limits  CBC  CBG MONITORING, ED                                                                                                                         EKG  EKG Interpretation  Date/Time:  Friday April 16 2019 21:36:31 EST Ventricular Rate:  60 PR Interval:  138 QRS  Duration: 92 QT Interval:  402 QTC Calculation: 402 R Axis:   81 Text Interpretation: Normal sinus rhythm Normal ECG No significant change since last tracing Confirmed by Addison Lank [id number]) on 04/17/2019 1:46:18 AM      Radiology CT ABDOMEN PELVIS W CONTRAST  Result Date: 04/17/2019 CLINICAL DATA:  Pt c/o growth on her vagina that is sore. Sts h/o same and believes it could be cancer cells. 100 ml omni 300 iv^130mL OMNIPAQUE IOHEXOL 300 MG/ML SOLNAbd pain, unspecified EXAM: CT ABDOMEN AND PELVIS WITH CONTRAST TECHNIQUE: Multidetector CT imaging of the abdomen and pelvis was performed using the standard protocol following bolus administration of intravenous contrast. CONTRAST:  195mL OMNIPAQUE IOHEXOL 300 MG/ML  SOLN COMPARISON:  CT 12/04/2018 FINDINGS: Lower chest: Lung bases are clear. Hepatobiliary: segmental region of increased vascular flow in the RIGHT hepatic lobe with a central hyperdensity (image 21/7). Favor benign vascular phenomena. There is a small hypervascular focus at the same location on exam from 12/04/2018 and not changed  in size. Mild enhancement of gallbladder wall. No distension. Pancreas: No pancreatic inflammation. Pancreatic duct and common bile duct normal. Spleen: Normal spleen Adrenals/urinary tract: Adrenal glands normal. Kidneys enhance symmetrically. Potential nonobstructing calculus within the mid ureter measuring 1 mm on image 51/3. No hydroureter. The ureter is very thin proximal to this potential calculus. Bladder normal. Stomach/Bowel: Stomach, small bowel and colon normal. Cecum normal. Appendix not identified. Ascending, transverse and descending colon normal. Vascular/Lymphatic: Abdominal aorta is normal caliber with atherosclerotic calcification. There is no retroperitoneal or periportal lymphadenopathy. No pelvic lymphadenopathy. Reproductive: Uterus and adnexa are normal. No abnormality of the vagina or external genitalia identified. Recommend clinical  correlation. Other: No pelvic lymphadenopathy Musculoskeletal: RIGHT hip prosthetic. No aggressive osseous lesion. Compression fractures at T12/L1 unchanged. IMPRESSION: 1. No abnormality of the vagina or external genitalia. Recommend clinical correlation/physical exam as vagina/external genitalia is not well evaluated by CT. 2. No pelvic lymphadenopathy. 3. Nonobstructing calculus in the mid LEFT ureter.  No hydroureter. 4. Segmental enhancing focus in the RIGHT hepatic lobe is favored benign vascular phenomena. 5.  Aortic Atherosclerosis (ICD10-I70.0). Electronically Signed   By: Suzy Bouchard M.D.   On: 04/17/2019 05:17    Pertinent labs & imaging results that were available during my care of the patient were reviewed by me and considered in my medical decision making (see chart for details).  Medications Ordered in ED Medications  sodium chloride flush (NS) 0.9 % injection 3 mL (3 mLs Intravenous Not Given 04/17/19 0406)  acetaminophen (TYLENOL) tablet 1,000 mg (1,000 mg Oral Not Given 04/17/19 0630)  sodium chloride 0.9 % bolus 500 mL (0 mLs Intravenous Stopped 04/17/19 0302)  iohexol (OMNIPAQUE) 300 MG/ML solution 100 mL (100 mLs Intravenous Contrast Given 04/17/19 0420)                                                                                                                                    Procedures Procedures  (including critical care time)  Medical Decision Making / ED Course I have reviewed the nursing notes for this encounter and the patient's prior records (if available in EHR or on provided paperwork).   Shantasia Fonnesbeck was evaluated in Emergency Department on 04/17/2019 for the symptoms described in the history of present illness. She was evaluated in the context of the global COVID-19 pandemic, which necessitated consideration that the patient might be at risk for infection with the SARS-CoV-2 virus that causes COVID-19. Institutional protocols and algorithms that pertain  to the evaluation of patients at risk for COVID-19 are in a state of rapid change based on information released by regulatory bodies including the CDC and federal and state organizations. These policies and algorithms were followed during the patient's care in the ED.  Vulvar pain for vulvar dysplagia. No bleeding.  No superimposed infection. Labs grossly reassuring without significant electrolyte derangement or renal insufficiency.  No leukocytosis or anemia. CT scan without evidence of  metastatic disease or serious intra-abdominal inflammatory/infectious process.  Discussed findings with daughter extensively.  Fortunately patient does not require admission.  Consult to transition care was placed to assist patient and family with follow-up and transportation.      Final Clinical Impression(s) / ED Diagnoses Final diagnoses:  Vulvar pain    The patient appears reasonably screened and/or stabilized for discharge and I doubt any other medical condition or other [facility] requiring further screening, evaluation, or treatment in the ED at this time prior to discharge.  Disposition: Discharge  Condition: Good  I have discussed the results, Dx and Tx plan with the patient who expressed understanding and agree(s) with the plan. Discharge instructions discussed at great length. The patient was given strict return precautions who verbalized understanding of the instructions. No further questions at time of discharge.    ED Discharge Orders         Ordered    lidocaine (XYLOCAINE) 5 % ointment  As needed     04/17/19 0546    HYDROcodone-acetaminophen (NORCO/VICODIN) 5-325 MG tablet  Every 8 hours PRN,   Status:  Discontinued     04/17/19 0616    HYDROcodone-acetaminophen (NORCO/VICODIN) 5-325 MG tablet  Every 8 hours PRN     04/17/19 [place] narcotic database reviewed and no active prescriptions noted.   Follow Up: Gregor Hams, [place] [address] [phone]  Schedule an appointment as soon as possible for a visit        This chart was dictated using voice recognition software.  Despite best efforts to proofread,  errors can occur which can change the documentation meaning.   Fatima Blank, MD 04/17/19 [phone]

## 2019-04-17 NOTE — ED Notes (Signed)
Patient verbalized understanding of dc instructions, vss, ambulatory with nad.   

## 2019-04-17 NOTE — ED Notes (Signed)
Patient aware of u/a needed. States will let us know when she can go

## 2019-04-17 NOTE — ED Notes (Signed)
This RN contacted [employer] who advised they did not have a taxi currently and to call back in a few minutes. Ebony Hail EDT in lobby made aware of need to contact [employer] again in the next few minutes to get taxi set up for patient.

## 2019-04-28 ENCOUNTER — Emergency Department (HOSPITAL_COMMUNITY)
Admission: EM | Admit: 2019-04-28 | Discharge: 2019-04-29 | Disposition: A | Discharge status: Home / Self Care | Payer: Medicare Other | Attending: Emergency Medicine | Admitting: Emergency Medicine

## 2019-04-28 ENCOUNTER — Emergency Department (HOSPITAL_COMMUNITY): Payer: Medicare Other

## 2019-04-28 ENCOUNTER — Other Ambulatory Visit: Payer: Self-pay

## 2019-04-28 DIAGNOSIS — Z79899 Other long term (current) drug therapy: Secondary | ICD-10-CM | POA: Diagnosis not present

## 2019-04-28 DIAGNOSIS — Y929 Unspecified place or not applicable: Secondary | ICD-10-CM | POA: Insufficient documentation

## 2019-04-28 DIAGNOSIS — I110 Hypertensive heart disease with heart failure: Secondary | ICD-10-CM | POA: Insufficient documentation

## 2019-04-28 DIAGNOSIS — Z7982 Long term (current) use of aspirin: Secondary | ICD-10-CM | POA: Diagnosis not present

## 2019-04-28 DIAGNOSIS — I5032 Chronic diastolic (congestive) heart failure: Secondary | ICD-10-CM | POA: Diagnosis not present

## 2019-04-28 DIAGNOSIS — Z87891 Personal history of nicotine dependence: Secondary | ICD-10-CM | POA: Insufficient documentation

## 2019-04-28 DIAGNOSIS — R400 Somnolence: Secondary | ICD-10-CM

## 2019-04-28 DIAGNOSIS — Z7289 Other problems related to lifestyle: Secondary | ICD-10-CM

## 2019-04-28 DIAGNOSIS — F10929 Alcohol use, unspecified with intoxication, unspecified: Secondary | ICD-10-CM | POA: Insufficient documentation

## 2019-04-28 DIAGNOSIS — F1090 Alcohol use, unspecified, uncomplicated: Secondary | ICD-10-CM

## 2019-04-28 DIAGNOSIS — Y999 Unspecified external cause status: Secondary | ICD-10-CM | POA: Insufficient documentation

## 2019-04-28 DIAGNOSIS — J449 Chronic obstructive pulmonary disease, unspecified: Secondary | ICD-10-CM | POA: Diagnosis not present

## 2019-04-28 DIAGNOSIS — T50901A Poisoning by unspecified drugs, medicaments and biological substances, accidental (unintentional), initial encounter: Secondary | ICD-10-CM | POA: Insufficient documentation

## 2019-04-28 DIAGNOSIS — Z96641 Presence of right artificial hip joint: Secondary | ICD-10-CM | POA: Insufficient documentation

## 2019-04-28 DIAGNOSIS — Y939 Activity, unspecified: Secondary | ICD-10-CM | POA: Diagnosis not present

## 2019-04-28 DIAGNOSIS — Y901 Blood alcohol level of 20-39 mg/100 ml: Secondary | ICD-10-CM | POA: Diagnosis not present

## 2019-04-28 DIAGNOSIS — Z789 Other specified health status: Secondary | ICD-10-CM

## 2019-04-28 LAB — CBG MONITORING, ED: Glucose-Capillary: 99 mg/dL (ref 70–99)

## 2019-04-28 MED ORDER — NALOXONE HCL 0.4 MG/ML IJ SOLN: 0.4000 mg | INTRAMUSCULAR | Status: DC | PRN
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
## 2019-04-28 NOTE — ED Triage Notes (Signed)
Per EMS: Pt is coming from home with c/o Overdose. When EMS arrived the patient was breathing 4 RR per minute and had a pulse of 60. Pt had pinpoint pupils. 0.5mg  of Narcan administered at 09:30pm. Pt responded to Narcan and is currently A&Ox4. Pt denies the use of narcotics and is complaining of pain in her feet.  20g LFA  EMS VITALS: BP 104/60 HR 70 SPO2 95% RA CBG 110

## 2019-04-29 LAB — RAPID URINE DRUG SCREEN, HOSP PERFORMED
Amphetamines: NOT DETECTED
Barbiturates: NOT DETECTED
Benzodiazepines: POSITIVE — AB
Cocaine: NOT DETECTED
Opiates: NOT DETECTED
Tetrahydrocannabinol: NOT DETECTED

## 2019-04-29 LAB — COMPREHENSIVE METABOLIC PANEL
ALT: 22 U/L (ref 0–44)
AST: 32 U/L (ref 15–41)
Albumin: 3.8 g/dL (ref 3.5–5.0)
Alkaline Phosphatase: 54 U/L (ref 38–126)
Anion gap: 12 (ref 5–15)
BUN: 11 mg/dL (ref 8–23)
CO2: 25 mmol/L (ref 22–32)
Calcium: 9.2 mg/dL (ref 8.9–10.3)
Chloride: 100 mmol/L (ref 98–111)
Creatinine, Ser: 0.89 mg/dL (ref 0.44–1.00)
GFR calc Af Amer: >60 mL/min (ref >60)
GFR calc non Af Amer: >60 mL/min (ref >60)
Glucose, Bld: 100 mg/dL — ABNORMAL HIGH (ref 70–99)
Potassium: 4.2 mmol/L (ref 3.5–5.1)
Sodium: 137 mmol/L (ref 135–145)
Total Bilirubin: 1.4 mg/dL — ABNORMAL HIGH (ref 0.3–1.2)
Total Protein: 6.5 g/dL (ref 6.5–8.1)

## 2019-04-29 LAB — CBC WITH DIFFERENTIAL/PLATELET
Abs Immature Granulocytes: 0.07 10*3/uL (ref 0.00–0.07)
Basophils Absolute: 0.1 10*3/uL (ref 0.0–0.1)
Basophils Relative: 1 %
Eosinophils Absolute: 0.1 10*3/uL (ref 0.0–0.5)
Eosinophils Relative: 1 %
HCT: 39.7 % (ref 36.0–46.0)
Hemoglobin: 13.1 g/dL (ref 12.0–15.0)
Immature Granulocytes: 1 %
Lymphocytes Relative: 15 %
Lymphs Abs: 1.3 10*3/uL (ref 0.7–4.0)
MCH: 30.8 pg (ref 26.0–34.0)
MCHC: 33 g/dL (ref 30.0–36.0)
MCV: 93.4 fL (ref 80.0–100.0)
Monocytes Absolute: 0.5 10*3/uL (ref 0.1–1.0)
Monocytes Relative: 6 %
Neutro Abs: 6.5 10*3/uL (ref 1.7–7.7)
Neutrophils Relative %: 76 %
Platelets: 240 10*3/uL (ref 150–400)
RBC: 4.25 MIL/uL (ref 3.87–5.11)
RDW: 12.9 % (ref 11.5–15.5)
WBC: 8.6 10*3/uL (ref 4.0–10.5)
nRBC: 0 % (ref 0.0–0.2)

## 2019-04-29 LAB — ETHANOL: Alcohol, Ethyl (B): 31 mg/dL — ABNORMAL HIGH (ref <10)

## 2019-04-29 LAB — ACETAMINOPHEN LEVEL: Acetaminophen (Tylenol), Serum: <10 ug/mL — ABNORMAL LOW (ref 10–30)

## 2019-04-29 LAB — SALICYLATE LEVEL: Salicylate Lvl: <7 mg/dL — ABNORMAL LOW (ref 7.0–30.0)

## 2019-04-29 NOTE — ED Notes (Signed)
Patient provided scrubs to wear.

## 2019-04-29 NOTE — ED Notes (Signed)
Patient was verbalized discharge instructions. Pt had no further questions at this time. NAD. 

## 2019-04-29 NOTE — ED Notes (Signed)
Kim Blankenship ([id number]) was contacted in updated.

## 2019-07-22 ENCOUNTER — Emergency Department (HOSPITAL_COMMUNITY): Payer: Medicare Other

## 2019-07-22 ENCOUNTER — Observation Stay (HOSPITAL_COMMUNITY)
Admission: EM | Admit: 2019-07-22 | Discharge: 2019-07-23 | Disposition: A | Discharge status: Home / Self Care | Payer: Medicare Other | Attending: Internal Medicine | Admitting: Internal Medicine

## 2019-07-22 ENCOUNTER — Encounter (HOSPITAL_COMMUNITY): Payer: Self-pay | Admitting: Family Medicine

## 2019-07-22 DIAGNOSIS — G9340 Encephalopathy, unspecified: Secondary | ICD-10-CM | POA: Diagnosis not present

## 2019-07-22 DIAGNOSIS — Z87891 Personal history of nicotine dependence: Secondary | ICD-10-CM | POA: Insufficient documentation

## 2019-07-22 DIAGNOSIS — J9601 Acute respiratory failure with hypoxia: Secondary | ICD-10-CM | POA: Diagnosis not present

## 2019-07-22 DIAGNOSIS — Z7982 Long term (current) use of aspirin: Secondary | ICD-10-CM | POA: Diagnosis not present

## 2019-07-22 DIAGNOSIS — T402X1A Poisoning by other opioids, accidental (unintentional), initial encounter: Secondary | ICD-10-CM | POA: Insufficient documentation

## 2019-07-22 DIAGNOSIS — I10 Essential (primary) hypertension: Secondary | ICD-10-CM | POA: Diagnosis not present

## 2019-07-22 DIAGNOSIS — C519 Malignant neoplasm of vulva, unspecified: Secondary | ICD-10-CM | POA: Diagnosis not present

## 2019-07-22 DIAGNOSIS — Z7951 Long term (current) use of inhaled steroids: Secondary | ICD-10-CM | POA: Insufficient documentation

## 2019-07-22 DIAGNOSIS — Z96641 Presence of right artificial hip joint: Secondary | ICD-10-CM | POA: Diagnosis not present

## 2019-07-22 DIAGNOSIS — E785 Hyperlipidemia, unspecified: Secondary | ICD-10-CM | POA: Diagnosis not present

## 2019-07-22 DIAGNOSIS — F0390 Unspecified dementia without behavioral disturbance: Secondary | ICD-10-CM | POA: Insufficient documentation

## 2019-07-22 DIAGNOSIS — Z8673 Personal history of transient ischemic attack (TIA), and cerebral infarction without residual deficits: Secondary | ICD-10-CM | POA: Diagnosis not present

## 2019-07-22 DIAGNOSIS — F329 Major depressive disorder, single episode, unspecified: Secondary | ICD-10-CM | POA: Diagnosis not present

## 2019-07-22 DIAGNOSIS — F419 Anxiety disorder, unspecified: Secondary | ICD-10-CM | POA: Insufficient documentation

## 2019-07-22 DIAGNOSIS — I110 Hypertensive heart disease with heart failure: Secondary | ICD-10-CM | POA: Insufficient documentation

## 2019-07-22 DIAGNOSIS — Z20822 Contact with and (suspected) exposure to covid-19: Secondary | ICD-10-CM | POA: Diagnosis not present

## 2019-07-22 DIAGNOSIS — I480 Paroxysmal atrial fibrillation: Secondary | ICD-10-CM | POA: Diagnosis present

## 2019-07-22 DIAGNOSIS — T40601A Poisoning by unspecified narcotics, accidental (unintentional), initial encounter: Secondary | ICD-10-CM | POA: Diagnosis present

## 2019-07-22 DIAGNOSIS — F4325 Adjustment disorder with mixed disturbance of emotions and conduct: Secondary | ICD-10-CM | POA: Diagnosis present

## 2019-07-22 DIAGNOSIS — J449 Chronic obstructive pulmonary disease, unspecified: Secondary | ICD-10-CM | POA: Diagnosis present

## 2019-07-22 DIAGNOSIS — Z79899 Other long term (current) drug therapy: Secondary | ICD-10-CM | POA: Insufficient documentation

## 2019-07-22 DIAGNOSIS — Z79891 Long term (current) use of opiate analgesic: Secondary | ICD-10-CM | POA: Insufficient documentation

## 2019-07-22 DIAGNOSIS — T50904A Poisoning by unspecified drugs, medicaments and biological substances, undetermined, initial encounter: Secondary | ICD-10-CM

## 2019-07-22 LAB — CBC
HCT: 41.1 % (ref 36.0–46.0)
Hemoglobin: 13.3 g/dL (ref 12.0–15.0)
MCH: 31.9 pg (ref 26.0–34.0)
MCHC: 32.4 g/dL (ref 30.0–36.0)
MCV: 98.6 fL (ref 80.0–100.0)
Platelets: 248 10*3/uL (ref 150–400)
RBC: 4.17 MIL/uL (ref 3.87–5.11)
RDW: 13.2 % (ref 11.5–15.5)
WBC: 6.4 10*3/uL (ref 4.0–10.5)
nRBC: 0 % (ref 0.0–0.2)

## 2019-07-22 LAB — COMPREHENSIVE METABOLIC PANEL
ALT: 19 U/L (ref 0–44)
AST: 31 U/L (ref 15–41)
Albumin: 3.7 g/dL (ref 3.5–5.0)
Alkaline Phosphatase: 78 U/L (ref 38–126)
Anion gap: 12 (ref 5–15)
BUN: 11 mg/dL (ref 8–23)
CO2: 26 mmol/L (ref 22–32)
Calcium: 9.2 mg/dL (ref 8.9–10.3)
Chloride: 101 mmol/L (ref 98–111)
Creatinine, Ser: 0.87 mg/dL (ref 0.44–1.00)
GFR calc Af Amer: >60 mL/min (ref >60)
GFR calc non Af Amer: >60 mL/min (ref >60)
Glucose, Bld: 95 mg/dL (ref 70–99)
Potassium: 4.2 mmol/L (ref 3.5–5.1)
Sodium: 139 mmol/L (ref 135–145)
Total Bilirubin: 1 mg/dL (ref 0.3–1.2)
Total Protein: 6.1 g/dL — ABNORMAL LOW (ref 6.5–8.1)

## 2019-07-22 LAB — AMMONIA: Ammonia: 13 umol/L (ref 9–35)

## 2019-07-22 LAB — ACETAMINOPHEN LEVEL: Acetaminophen (Tylenol), Serum: <10 ug/mL — ABNORMAL LOW (ref 10–30)

## 2019-07-22 LAB — LACTIC ACID, PLASMA: Lactic Acid, Venous: 0.9 mmol/L (ref 0.5–1.9)

## 2019-07-22 LAB — SALICYLATE LEVEL: Salicylate Lvl: <7 mg/dL — ABNORMAL LOW (ref 7.0–30.0)

## 2019-07-22 LAB — ETHANOL: Alcohol, Ethyl (B): <10 mg/dL (ref <10)

## 2019-07-22 LAB — RESPIRATORY PANEL BY RT PCR (FLU A&B, COVID)
Influenza A by PCR: NEGATIVE
Influenza B by PCR: NEGATIVE
SARS Coronavirus 2 by RT PCR: NEGATIVE

## 2019-07-22 MED ORDER — NALOXONE HCL 0.4 MG/ML IJ SOLN
0.4000 mg | Freq: Once | INTRAMUSCULAR | Status: AC
  Administered 2019-07-22: 0.4 mg via INTRAVENOUS
  Filled 2019-07-22: qty 1

## 2019-07-22 MED ORDER — ALBUTEROL SULFATE HFA 108 (90 BASE) MCG/ACT IN AERS
2.0000 | INHALATION_SPRAY | RESPIRATORY_TRACT | Status: DC | PRN
  Filled 2019-07-22: qty 6.7

## 2019-07-22 MED ORDER — NALOXONE HCL 0.4 MG/ML IJ SOLN
0.4000 mg | Freq: Once | INTRAMUSCULAR | Status: AC
  Administered 2019-07-22: 19:00:00 0.4 mg via INTRAVENOUS
  Filled 2019-07-22: qty 1

## 2019-07-22 MED ORDER — NALOXONE HCL 0.4 MG/ML IJ SOLN: 0.4000 mg | INTRAMUSCULAR | Status: DC | PRN

## 2019-07-22 NOTE — H&P (Addendum)
History and Physical    Kim Blankenship [id number] DOB: Jul 30, 1945 DOA: 07/22/2019  PCP: Tobie Lords D, FNP   Patient coming from: Home   Chief Complaint: Confused   HPI: Kim Blankenship is a 74 y.o. female with medical history significant for COPD, atrial fibrillation not anticoagulated due to history of GI bleeding, worsening dementia per report of her daughter, hypertension, history of CVA, and vulvar cancer now presenting to the emergency department after she was noted to be wandering her apartment complex and appearing confused.  Patient's daughter provides most of the history and reports that she usually manages the patient's medications including hydrocodone that she takes for pain related to her vulvar cancer, but patient was managing her own medications today all of daughter was with her sick spouse in the hospital.  Patient's daughter reports that the patient has dementia that has been progressively worsening in recent months but there has not been any acute change.  Patient complains of pain related to the vulvar cancer, but no new complaints recently.  She has not had a seizure in a long time and no longer takes antiepileptic.  She has not been drinking alcohol.  ED Course: Upon arrival to the ED, patient is found to be afebrile, saturating mid 90s on room air, and with stable blood pressure.  EKG features a sinus rhythm, noncontrast head CT negative for acute abnormality, and no acute findings noted on chest x-ray.  Chemistry panel and CBC are unremarkable.  Lactic acid is normal.  Ammonia level normal and ethanol undetectable.  Patient was treated with Narcan in the ED and became alert and agitated.  She became somnolent and slightly hypoxic 2 hours later and again became alert and agitated after Narcan.  Review of Systems:  Unable to complete ROS secondary to patient's clinical condition.  Past Medical History:  Diagnosis Date  . Asthma   . Atrial fibrillation ([place])   . COPD  (chronic obstructive pulmonary disease) ([place])   . Hypertension   . Irregular heart rate   . PRES (posterior reversible encephalopathy syndrome)   . Stroke [facility])     Past Surgical History:  Procedure Laterality Date  . CESAREAN SECTION    . Left Foot Reconstruction    . OPEN REDUCTION INTERNAL FIXATION (ORIF) METACARPAL Right 03/10/2015   Procedure: OPEN REDUCTION INTERNAL FIXATION (ORIF) RIGHT LONG  METACARPAL FRACTURE;  Surgeon: Leanora Cover, MD;  Location: [facility];  Service: Orthopedics;  Laterality: Right;  . TONSILLECTOMY    . TOTAL HIP ARTHROPLASTY Right 02/09/2016   Procedure: TOTAL HIP ARTHROPLASTY ANTERIOR APPROACH;  Surgeon: Frederik Pear, MD;  Location: [facility];  Service: Orthopedics;  Laterality: Right;     reports that she quit smoking about 24 years ago. Her smoking use included cigarettes. She has a 60.00 pack-year smoking history. She has never used smokeless tobacco. She reports current alcohol use. She reports that she does not use drugs.  Allergies  Allergen Reactions  . Tape Other (See Comments)    THE PATIENT'S SKIN IS THIN AND TEARS VERY EASILY (she "picks" at it; please use coban wrap)    Family History  Problem Relation Age of Onset  . Dementia Father      Prior to Admission medications   Medication Sig Start Date End Date Taking? Authorizing Provider  albuterol (VENTOLIN HFA) 108 (90 Base) MCG/ACT inhaler Inhale 2 puffs into the lungs every 4 (four) hours as needed for wheezing or shortness of breath.   Yes [provider]  amLODipine (NORVASC) 2.5 MG tablet Take 2.5 mg by mouth daily.   Yes [provider]  aspirin EC 81 MG tablet Take 81 mg by mouth daily.   Yes [provider]  atorvastatin (LIPITOR) 20 MG tablet Take 20 mg by mouth daily. 04/08/16  Yes [provider]  calcium carbonate (TUMS - DOSED IN MG ELEMENTAL CALCIUM) 500 MG chewable tablet Chew 1 tablet by mouth daily.   Yes [provider]  Fluticasone-Umeclidin-Vilant (TRELEGY ELLIPTA) 100-62.5-25 MCG/INH AEPB Inhale 1 puff into the lungs in the morning, at noon, in the evening, and at bedtime. 06/15/19  Yes [provider]  ipratropium-albuterol (DUONEB) 0.5-2.5 (3) MG/3ML SOLN Take 3 mLs by nebulization every 6 (six) hours as needed (sob).   Yes [provider]  Multiple Vitamin (MULTIVITAMIN WITH MINERALS) TABS tablet Take 1 tablet by mouth daily. 12/11/18  Yes Hongalgi, Lenis Dickinson, MD  OLANZapine (ZYPREXA) 2.5 MG tablet Take 2.5 mg by mouth at bedtime.   Yes [provider]  QUEtiapine (SEROQUEL) 100 MG tablet Take 250 mg by mouth at bedtime.    Yes [provider]  spironolactone (ALDACTONE) 25 MG tablet Take 25 mg by mouth daily.   Yes [provider]  thiamine 100 MG tablet Take 1 tablet (100 mg total) by mouth daily. 12/11/18  Yes Hongalgi, Lenis Dickinson, MD  ferrous sulfate 325 (65 FE) MG tablet Take 1 tablet (325 mg total) by mouth 2 (two) times daily with a meal. Patient not taking: Reported on 07/22/2019 12/10/18   Modena Jansky, MD  folic acid (FOLVITE) 1 MG tablet Take 1 tablet (1 mg total) by mouth daily. Patient not taking: Reported on 07/22/2019 12/11/18   Modena Jansky, MD  levETIRAcetam (KEPPRA) 500 MG tablet Take 1 tablet (500 mg total) by mouth 2 (two) times daily. Patient not taking: Reported on 07/22/2019 11/06/16   Florencia Reasons, MD  lidocaine (XYLOCAINE) 5 % ointment Apply 1 application topically as needed. Patient not taking: Reported on 07/22/2019 04/17/19   Fatima Blank, MD  metoprolol tartrate (LOPRESSOR) 25 MG tablet Take 1-2 tablets (25-50 mg total) by mouth See admin instructions. Take 2 tablets every morning and take 1 tablet in the evening Patient not taking: Reported on 07/22/2019 12/10/18   Modena Jansky, MD  naloxone [facility]) nasal spray 4 mg/0.1 mL Place 1 spray into the nose daily as needed (For overdose).     [provider]    Physical  Exam: Vitals:   07/22/19 2200 07/22/19 2215 07/22/19 2230 07/22/19 2245  BP: (!) 130/54 (!) 132/57 128/60 96/63  Pulse: 73 73 74 97  Resp: 11 12 10  (!) 33  Temp:      SpO2: (!) 87% (!) 88% (!) 88% 96%    Constitutional: NAD, agitated   Eyes: PERTLA, lids and conjunctivae normal ENMT: Mucous membranes are moist. Posterior pharynx clear of any exudate or lesions.   Neck: normal, supple, no masses, no thyromegaly Respiratory: Speaking full sentances, no crackles. No accessory muscle use.  Cardiovascular: S1 & S2 heard, regular rate and rhythm. No extremity edema. Abdomen: No distension, no tenderness, soft. Bowel sounds active.  Musculoskeletal: no clubbing / cyanosis. No joint deformity upper and lower extremities.   Skin: no significant rashes, lesions, ulcers. Warm, dry, well-perfused. Neurologic: CN 2-12 grossly intact. Sensation intact. Strength 5/5 in all 4 limbs.  Psychiatric: Alert, agitated, oriented to person and place only.     Labs and  Imaging on Admission: I have personally reviewed following labs and imaging studies  CBC: Recent Labs  Lab 07/22/19 1828  WBC 6.4  HGB 13.3  HCT 41.1  MCV 98.6  PLT [id number]   Basic Metabolic Panel: Recent Labs  Lab 07/22/19 1828  NA 139  K 4.2  CL 101  CO2 26  GLUCOSE 95  BUN 11  CREATININE 0.87  CALCIUM 9.2   GFR: CrCl cannot be calculated (Unknown ideal weight.). Liver Function Tests: Recent Labs  Lab 07/22/19 1828  AST 31  ALT 19  ALKPHOS 78  BILITOT 1.0  PROT 6.1*  ALBUMIN 3.7   No results for input(s): LIPASE, AMYLASE in the last 168 hours. Recent Labs  Lab 07/22/19 1828  AMMONIA 13   Coagulation Profile: No results for input(s): INR, PROTIME in the last 168 hours. Cardiac Enzymes: No results for input(s): CKTOTAL, CKMB, CKMBINDEX, TROPONINI in the last 168 hours. BNP (last 3 results) No results for input(s): PROBNP in the last 8760 hours. HbA1C: No results for input(s): HGBA1C in the last 72  hours. CBG: No results for input(s): GLUCAP in the last 168 hours. Lipid Profile: No results for input(s): CHOL, HDL, LDLCALC, TRIG, CHOLHDL, LDLDIRECT in the last 72 hours. Thyroid Function Tests: No results for input(s): TSH, T4TOTAL, FREET4, T3FREE, THYROIDAB in the last 72 hours. Anemia Panel: No results for input(s): VITAMINB12, FOLATE, FERRITIN, TIBC, IRON, RETICCTPCT in the last 72 hours. Urine analysis:    Component Value Date/Time   COLORURINE YELLOW 12/04/2018 2026   APPEARANCEUR HAZY (A) 12/04/2018 2026   LABSPEC 1.021 12/04/2018 2026   PHURINE 5.0 12/04/2018 2026   GLUCOSEU NEGATIVE 12/04/2018 2026   HGBUR MODERATE (A) 12/04/2018 2026   BILIRUBINUR NEGATIVE 12/04/2018 2026   KETONESUR NEGATIVE 12/04/2018 2026   PROTEINUR NEGATIVE 12/04/2018 2026   NITRITE NEGATIVE 12/04/2018 2026   LEUKOCYTESUR LARGE (A) 12/04/2018 2026   Sepsis Labs: @LABRCNTIP (procalcitonin:4,lacticidven:4) )No results found for this or any previous visit (from the past 240 hour(s)).   Radiological Exams on Admission: CT Head Wo Contrast  Result Date: 07/22/2019 CLINICAL DATA:  Disoriented.  Altered mental status. EXAM: CT HEAD WITHOUT CONTRAST TECHNIQUE: Contiguous axial images were obtained from the base of the skull through the vertex without intravenous contrast. COMPARISON:  04/28/2019 FINDINGS: Brain: No evidence of acute infarction, hemorrhage, hydrocephalus, extra-axial collection or mass lesion/mass effect. Mild patchy areas of white matter hypoattenuation are noted consistent with chronic microvascular ischemic change. Vascular: No hyperdense vessel or unexpected calcification. Skull: Normal. Negative for fracture or focal lesion. Sinuses/Orbits: Globes and orbits are unremarkable. Visualized sinuses are clear. Other: None. IMPRESSION: 1. No acute intracranial abnormalities. 2. Mild chronic microvascular ischemic change. Stable appearance from the prior head CT. Electronically Signed   By: Lajean Manes M.D.   On: 07/22/2019 19:25   DG Chest Port 1 View  Result Date: 07/22/2019 CLINICAL DATA:  Cough. EXAM: PORTABLE CHEST 1 VIEW COMPARISON:  04/28/2019 FINDINGS: Lower lung volumes from prior exam.The cardiomediastinal contours are normal. Pulmonary vasculature is normal. No consolidation, pleural effusion, or pneumothorax. No acute osseous abnormalities are seen. IMPRESSION: Hypoventilatory chest without acute abnormality. Electronically Signed   By: Keith Rake M.D.   On: 07/22/2019 19:10    EKG: Independently reviewed. Sinus rhythm.   Assessment/Plan   1. Acute encephalopathy; suspected opiate overdose  - Presents with somnolence and confusion after found wandering her apartment complex, became more somnolent in ED, woke and became agitated after Narcan  - Per report of  patient's daughter, the patient has been taking hydrocodone for pain related to vulvar cancer, usually has daughter managing medications but managed her own today as daughter was visiting sick spouse in hospital  - No acute findings on CT head, basic labs and ammonia level normal, has responded to Narcan, and this is likely related to accidental opiate overdose superimposed on dementia  - Continue close monitoring with Narcan as needed, follow-up pending APAP and salicylate levels, consider extending workup if fails to resolve as expected over the next 24 hours    2. Hypertension  - Continue Norvasc   3. PAF  - In sinus rhythm on admission  - CHADS-VASc is 16 (age, gender, CVA x2, HTN)  - Not anticoagulated d/t hx of GIB    4. COPD  - Continue Trelegy, as-needed albuterol    5. Vulvar cancer  - Scheduled for upcoming surgery   6. History of CVA  - No acute findings on head CT  - Cotinue ASA and statin    7. Dementia; depression; anxiety  - Patient has worsening dementia per report of her daughter, becomes agitated at times, and will be continued on Zyprexa and Seroquel     DVT prophylaxis: Lovenox   Code Status: Full  Family Communication: Daughter updated by phone  Disposition Plan:  Patient is from: home  Anticipated d/c is to: Home with family vs SNF  Anticipated d/c date is: 07/24/19  Patient currently: Requiring inpatient management of suspected opiate overdose, requiring multiple doses Narcan in ED  Consults called: None  Admission status: Observation     Vianne Bulls, MD [facility] Pager: See [email or web address]  If 7AM-7PM, please contact the daytime attending [email or web address]  07/22/2019, 11:21 PM

## 2019-07-22 NOTE — ED Notes (Signed)
Pt sleeping sitting up.  02 sat's 86% on room air.  Pt will arouse when name called then fall back asleep.  Narcan 0.4mg  IV given.  Pt woke up screaming "why did you give me that again, get away from me".  Pt then attempted to hit this RN.  Dr. Myna Hidalgo at bedside explaining to pt that she was not breathing enough and that we were trying to help her.

## 2019-07-22 NOTE — Progress Notes (Signed)
ABG attempted. Patient requested ABG to stop and not tried again. Notified Noemi Chapel, MD

## 2019-07-22 NOTE — ED Notes (Signed)
Assumed care on patient , respirations unlabored , iv site intact , somnolent , waiting for bed assignment .

## 2019-07-22 NOTE — ED Notes (Signed)
Dr. Myna Hidalgo in to assess pt for ad\mission

## 2019-07-22 NOTE — ED Provider Notes (Signed)
[place] [facility] EMERGENCY DEPARTMENT Provider Note   CSN: [id number] Arrival date & time: 07/22/19  1805     History No chief complaint on file.   Kim Blankenship is a 74 y.o. female.  HPI   This patient is a 74 year old female, she has a known history of asthma, atrial fibrillation and COPD.  She reports that she no longer smokes, of note on her medical history it does report that she has a history of posterior reversible encephalopathy syndrome and a prior history of a stroke.  The patient presents with altered mental status, a level 5 caveat applies secondary to the patient's confusion.  The patient was apparently seen walking around her apartment complex aimlessly confused and paramedics were called to the scene.  They found the patient to be walking, short of breath and a little bit hypoxic, there was no edema of the legs, there is no signs of vomiting or seizures.  The patient does not recall, paramedics report that there was an empty cough syrup bottle in the house, the patient mumbles that it is her daughter that drinks does not her.  The patient states she no longer smokes.  She cannot tell me anything else about it.  According to the daughter the patient does have dementia which has been getting worse over time  Past Medical History:  Diagnosis Date  . Asthma   . Atrial fibrillation ([place])   . COPD (chronic obstructive pulmonary disease) ([place])   . Hypertension   . Irregular heart rate   . PRES (posterior reversible encephalopathy syndrome)   . Stroke [facility])     Patient Active Problem List   Diagnosis Date Noted  . Acute encephalopathy 07/22/2019  . Delirium   . GIB (gastrointestinal bleeding) 12/04/2018  . Chronic diastolic CHF (congestive heart failure) ([place]) 12/04/2018  . 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 AKI (acute kidney injury) ([place]) 11/21/2016  . Stroke (cerebrum) ([place]) 11/21/2016  . Hyponatremia  11/20/2016  . UTI (urinary tract infection) 11/20/2016  . HLD (hyperlipidemia) 11/20/2016  . GERD (gastroesophageal reflux disease) 11/20/2016  . Depression 11/20/2016  . Hypertensive emergency 11/04/2016  . EtOH dependence ([place]) 11/04/2016  . Hyperglycemia 11/04/2016  . Essential hypertension   . Hypertensive urgency 10/30/2016  . PAF (paroxysmal atrial fibrillation) ([place]) 10/30/2016  . PRES (posterior reversible encephalopathy syndrome) 10/30/2016  . Seizure ([place])   . Acute blood loss anemia   . Closed fracture of right hip ([place]) 02/09/2016  . Chest pain 11/07/2015  . COPD (chronic obstructive pulmonary disease) ([place]) 08/19/2013  . Abnormal EKG 08/19/2013  . Fever, unspecified 08/19/2013    Past Surgical History:  Procedure Laterality Date  . CESAREAN SECTION    . Left Foot Reconstruction    . OPEN REDUCTION INTERNAL FIXATION (ORIF) METACARPAL Right 03/10/2015   Procedure: OPEN REDUCTION INTERNAL FIXATION (ORIF) RIGHT LONG  METACARPAL FRACTURE;  Surgeon: Leanora Cover, MD;  Location: [facility];  Service: Orthopedics;  Laterality: Right;  . TONSILLECTOMY    . TOTAL HIP ARTHROPLASTY Right 02/09/2016   Procedure: TOTAL HIP ARTHROPLASTY ANTERIOR APPROACH;  Surgeon: Frederik Pear, MD;  Location: [facility];  Service: Orthopedics;  Laterality: Right;     OB History    Gravida      Para      Term      Preterm      AB      Living  1     SAB  TAB      Ectopic      Multiple      Live Births              Family History  Problem Relation Age of Onset  . Dementia Father     Social History   Tobacco Use  . Smoking status: Former Smoker    Packs/day: 2.00    Years: 30.00    Pack years: 60.00    Types: Cigarettes    Quit date: 04/1995    Years since quitting: 24.3  . Smokeless tobacco: Never Used  Substance Use Topics  . Alcohol use: Yes    Comment: wine daily  . Drug use: No    Home Medications Prior to Admission medications     Medication Sig Start Date End Date Taking? Authorizing Provider  albuterol (VENTOLIN HFA) 108 (90 Base) MCG/ACT inhaler Inhale 2 puffs into the lungs every 4 (four) hours as needed for wheezing or shortness of breath.   Yes [provider]  amLODipine (NORVASC) 2.5 MG tablet Take 2.5 mg by mouth daily.   Yes [provider]  aspirin EC 81 MG tablet Take 81 mg by mouth daily.   Yes [provider]  atorvastatin (LIPITOR) 20 MG tablet Take 20 mg by mouth daily. 04/08/16  Yes [provider]  calcium carbonate (TUMS - DOSED IN MG ELEMENTAL CALCIUM) 500 MG chewable tablet Chew 1 tablet by mouth daily.   Yes [provider]  Fluticasone-Umeclidin-Vilant (TRELEGY ELLIPTA) 100-62.5-25 MCG/INH AEPB Inhale 1 puff into the lungs in the morning, at noon, in the evening, and at bedtime. 06/15/19  Yes [provider]  ipratropium-albuterol (DUONEB) 0.5-2.5 (3) MG/3ML SOLN Take 3 mLs by nebulization every 6 (six) hours as needed (sob).   Yes [provider]  Multiple Vitamin (MULTIVITAMIN WITH MINERALS) TABS tablet Take 1 tablet by mouth daily. 12/11/18  Yes Hongalgi, Lenis Dickinson, MD  OLANZapine (ZYPREXA) 2.5 MG tablet Take 2.5 mg by mouth at bedtime.   Yes [provider]  QUEtiapine (SEROQUEL) 100 MG tablet Take 250 mg by mouth at bedtime.    Yes [provider]  spironolactone (ALDACTONE) 25 MG tablet Take 25 mg by mouth daily.   Yes [provider]  thiamine 100 MG tablet Take 1 tablet (100 mg total) by mouth daily. 12/11/18  Yes Hongalgi, Lenis Dickinson, MD  ferrous sulfate 325 (65 FE) MG tablet Take 1 tablet (325 mg total) by mouth 2 (two) times daily with a meal. Patient not taking: Reported on 07/22/2019 12/10/18   [place] Jansky, MD  folic acid (FOLVITE) 1 MG tablet Take 1 tablet (1 mg total) by mouth daily. Patient not taking: Reported on 07/22/2019 12/11/18   [place] Jansky, MD  levETIRAcetam (KEPPRA) 500 MG tablet Take 1  tablet (500 mg total) by mouth 2 (two) times daily. Patient not taking: Reported on 07/22/2019 11/06/16   Florencia Reasons, MD  lidocaine (XYLOCAINE) 5 % ointment Apply 1 application topically as needed. Patient not taking: Reported on 07/22/2019 04/17/19   Fatima Blank, MD  metoprolol tartrate (LOPRESSOR) 25 MG tablet Take 1-2 tablets (25-50 mg total) by mouth See admin instructions. Take 2 tablets every morning and take 1 tablet in the evening Patient not taking: Reported on 07/22/2019 12/10/18   [place] Jansky, MD  naloxone [facility]) nasal spray 4 mg/0.1 mL Place 1 spray into the nose daily as needed (For overdose).     [provider]  Allergies    Tape  Review of Systems   Review of Systems  Unable to perform ROS: Mental status change    Physical Exam Updated Vital Signs BP (!) 138/123   Pulse 73   Temp 98.2 F (36.8 C)   Resp 18   SpO2 (!) 89%   Physical Exam Vitals and nursing note reviewed.  Constitutional:      General: She is not in acute distress.    Appearance: She is well-developed.     Comments: Somnolent but arousable to voice  HENT:     Head: Normocephalic and atraumatic.     Mouth/Throat:     Pharynx: No oropharyngeal exudate.  Eyes:     General: No scleral icterus.       Right eye: No discharge.        Left eye: No discharge.     Conjunctiva/sclera: Conjunctivae normal.     Pupils: Pupils are equal, round, and reactive to light.  Neck:     Thyroid: No thyromegaly.     Vascular: No JVD.  Cardiovascular:     Rate and Rhythm: Normal rate and regular rhythm.     Heart sounds: Normal heart sounds. No murmur. No friction rub. No gallop.   Pulmonary:     Effort: Pulmonary effort is normal. No respiratory distress.     Breath sounds: Normal breath sounds. No wheezing or rales.  Abdominal:     General: Bowel sounds are normal. There is no distension.     Palpations: Abdomen is soft. There is no mass.     Tenderness: There is no abdominal  tenderness.  Musculoskeletal:        General: No tenderness. Normal range of motion.     Cervical back: Normal range of motion and neck supple.  Lymphadenopathy:     Cervical: No cervical adenopathy.  Skin:    General: Skin is warm and dry.     Findings: No erythema or rash.  Neurological:     Coordination: Coordination normal.     Comments: Able to lift all 4 extremities, grips are normal, she is weak diffusely and slow to move, slow to answer questions, mumbles softly, sometimes answers questions appropriately and sometimes she mumble something that is an audible  Psychiatric:        Behavior: Behavior normal.     ED Results / Procedures / Treatments   Labs (all labs ordered are listed, but only abnormal results are displayed) Labs Reviewed  COMPREHENSIVE METABOLIC PANEL - Abnormal; Notable for the following components:      Result Value   Total Protein 6.1 (*)    All other components within normal limits  RESPIRATORY PANEL BY RT PCR (FLU A&B, COVID)  CBC  LACTIC ACID, PLASMA  AMMONIA  ETHANOL  RAPID URINE DRUG SCREEN, HOSP PERFORMED  URINALYSIS, ROUTINE W REFLEX MICROSCOPIC  BLOOD GAS, ARTERIAL  ACETAMINOPHEN LEVEL  SALICYLATE LEVEL    EKG EKG Interpretation  Date/Time:  Thursday July 22 2019 18:14:33 EDT Ventricular Rate:  74 PR Interval:    QRS Duration: 96 QT Interval:  387 QTC Calculation: 430 R Axis:   83 Text Interpretation: Sinus rhythm Borderline right axis deviation since last tracing no significant change Confirmed by Noemi Chapel [phone]) on 07/22/2019 6:18:19 PM   Radiology CT Head Wo Contrast  Result Date: 07/22/2019 CLINICAL DATA:  Disoriented.  Altered mental status. EXAM: CT HEAD WITHOUT CONTRAST TECHNIQUE: Contiguous axial images were obtained from the base of the skull through  the vertex without intravenous contrast. COMPARISON:  04/28/2019 FINDINGS: Brain: No evidence of acute infarction, hemorrhage, hydrocephalus, extra-axial collection or  mass lesion/mass effect. Mild patchy areas of white matter hypoattenuation are noted consistent with chronic microvascular ischemic change. Vascular: No hyperdense vessel or unexpected calcification. Skull: Normal. Negative for fracture or focal lesion. Sinuses/Orbits: Globes and orbits are unremarkable. Visualized sinuses are clear. Other: None. IMPRESSION: 1. No acute intracranial abnormalities. 2. Mild chronic microvascular ischemic change. Stable appearance from the prior head CT. Electronically Signed   By: Lajean Manes M.D.   On: 07/22/2019 19:25   DG Chest Port 1 View  Result Date: 07/22/2019 CLINICAL DATA:  Cough. EXAM: PORTABLE CHEST 1 VIEW COMPARISON:  04/28/2019 FINDINGS: Lower lung volumes from prior exam.The cardiomediastinal contours are normal. Pulmonary vasculature is normal. No consolidation, pleural effusion, or pneumothorax. No acute osseous abnormalities are seen. IMPRESSION: Hypoventilatory chest without acute abnormality. Electronically Signed   By: Keith Rake M.D.   On: 07/22/2019 19:10    Procedures .Critical Care Performed by: Noemi Chapel, MD Authorized by: Noemi Chapel, MD   Critical care provider statement:    Critical care time (minutes):  35   Critical care time was exclusive of:  Separately billable procedures and treating other patients and teaching time   Critical care was necessary to treat or prevent imminent or life-threatening deterioration of the following conditions:  CNS failure or compromise and toxidrome   Critical care was time spent personally by me on the following activities:  Blood draw for specimens, development of treatment plan with patient or surrogate, discussions with consultants, evaluation of patient's response to treatment, examination of patient, obtaining history from patient or surrogate, ordering and performing treatments and interventions, ordering and review of laboratory studies, ordering and review of radiographic studies, pulse  oximetry, re-evaluation of patient's condition and review of old charts   (including critical care time)  Medications Ordered in ED Medications  naloxone (NARCAN) injection 0.4 mg (has no administration in time range)  naloxone [facility]) injection 0.4 mg (0.4 mg Intravenous Given 07/22/19 1909)    ED Course  I have reviewed the triage vital signs and the nursing notes.  Pertinent labs & imaging results that were available during my care of the patient were reviewed by me and considered in my medical decision making (see chart for details).    MDM Rules/Calculators/A&P                       This patient presents to the ED for concern of altered mental status, this involves an extensive number of treatment options, and is a complaint that carries with it a high risk of complications and morbidity.  The differential diagnosis includes multiple different possible etiologies including intracranial hemorrhage, stroke, hypertension, encephalopathy, liver failure with hepatic encephalopathy, kidney failure, infection, trauma, overdose, tox, alcohol intoxication, drug abuse   Lab Tests:   I Ordered, reviewed, and interpreted labs, which included CBC, metabolic panel, drug screen, ABG to evaluate for CO2 as this could be hypercapnic confusion  Medicines ordered:   I ordered medication Narcan for altered mental status  Imaging Studies ordered:   I ordered imaging studies which included portable chest x-ray and a CT scan of the brain and  I independently visualized and interpreted imaging which showed no acute findings in the brain or the chest  Additional history obtained:   Additional history obtained from the medical record, paramedics  Previous records obtained and reviewed, according to  this the patient has a history of overdose, she was seen in January 2021, she was given Narcan for severe somnolence and decreased respiratory drive which 6 this.  I discussed the case with the  family members, the daughter who is the primary caregiver states that she is by herself most of the day, she will get into her medication sometimes and does not know how much she takes, she is prescribed hydrocodone for chronic pain.    Consultations Obtained:   I consulted with the hospitalist Dr. Myna Hidalgo and discussed lab and imaging findings  Reevaluation:  After the interventions stated above, I reevaluated the patient and found persistently somnolent, arouses to Narcan but then become somnolent again  Critical Interventions:  . Evaluation for coingestants including Tylenol and aspirin . Cardiac monitoring to rule out arrhythmia . Evaluation of electrolytes and renal function to rule out other causes of encephalopathy . CT scan and chest x-ray to rule out other causes of slowed breathing or abnormal mental status . Discussion with family members outside of the hospital to make sure that we have a full history   Final Clinical Impression(s) / ED Diagnoses Final diagnoses:  Drug overdose, undetermined intent, initial encounter  Acute encephalopathy  Acute respiratory failure with hypoxia (HCC)      Noemi Chapel, MD 07/22/19 2205

## 2019-07-22 NOTE — ED Triage Notes (Signed)
Pt bib ems after wandering around her apartment building. Per ems, neighbors were concerned because she was disoriented. VSS with ems.

## 2019-07-23 ENCOUNTER — Other Ambulatory Visit: Payer: Self-pay

## 2019-07-23 DIAGNOSIS — J9601 Acute respiratory failure with hypoxia: Secondary | ICD-10-CM

## 2019-07-23 LAB — CBC
HCT: 38.6 % (ref 36.0–46.0)
Hemoglobin: 12.8 g/dL (ref 12.0–15.0)
MCH: 32.2 pg (ref 26.0–34.0)
MCHC: 33.2 g/dL (ref 30.0–36.0)
MCV: 97 fL (ref 80.0–100.0)
Platelets: 245 10*3/uL (ref 150–400)
RBC: 3.98 MIL/uL (ref 3.87–5.11)
RDW: 13.1 % (ref 11.5–15.5)
WBC: 6.3 10*3/uL (ref 4.0–10.5)
nRBC: 0 % (ref 0.0–0.2)

## 2019-07-23 LAB — BASIC METABOLIC PANEL
Anion gap: 10 (ref 5–15)
BUN: 9 mg/dL (ref 8–23)
CO2: 26 mmol/L (ref 22–32)
Calcium: 9.1 mg/dL (ref 8.9–10.3)
Chloride: 102 mmol/L (ref 98–111)
Creatinine, Ser: 0.8 mg/dL (ref 0.44–1.00)
GFR calc Af Amer: >60 mL/min (ref >60)
GFR calc non Af Amer: >60 mL/min (ref >60)
Glucose, Bld: 95 mg/dL (ref 70–99)
Potassium: 4.9 mmol/L (ref 3.5–5.1)
Sodium: 138 mmol/L (ref 135–145)

## 2019-07-23 MED ORDER — ONDANSETRON HCL 4 MG PO TABS: 4.0000 mg | ORAL_TABLET | Freq: Four times a day (QID) | ORAL | Status: DC | PRN

## 2019-07-23 MED ORDER — FLUTICASONE-UMECLIDIN-VILANT 100-62.5-25 MCG/INH IN AEPB: 1.0000 | INHALATION_SPRAY | Freq: Every day | RESPIRATORY_TRACT | Status: DC

## 2019-07-23 MED ORDER — SODIUM CHLORIDE 0.9% FLUSH
3.0000 mL | Freq: Two times a day (BID) | INTRAVENOUS | Status: DC
  Administered 2019-07-23: 3 mL via INTRAVENOUS

## 2019-07-23 MED ORDER — ATORVASTATIN CALCIUM 10 MG PO TABS
20.0000 mg | ORAL_TABLET | Freq: Every day | ORAL | Status: DC
  Administered 2019-07-23: 20 mg via ORAL
  Filled 2019-07-23: qty 2

## 2019-07-23 MED ORDER — UMECLIDINIUM BROMIDE 62.5 MCG/INH IN AEPB
1.0000 | INHALATION_SPRAY | Freq: Every day | RESPIRATORY_TRACT | Status: DC
  Administered 2019-07-23: 08:00:00 1 via RESPIRATORY_TRACT
  Filled 2019-07-23: qty 7

## 2019-07-23 MED ORDER — ENOXAPARIN SODIUM 40 MG/0.4ML ~~LOC~~ SOLN
40.0000 mg | SUBCUTANEOUS | Status: DC
  Administered 2019-07-23: 09:00:00 40 mg via SUBCUTANEOUS
  Filled 2019-07-23: qty 0.4

## 2019-07-23 MED ORDER — ACETAMINOPHEN 650 MG RE SUPP: 650.0000 mg | Freq: Four times a day (QID) | RECTAL | Status: DC | PRN

## 2019-07-23 MED ORDER — SODIUM CHLORIDE 0.9 % IV SOLN: 250.0000 mL | INTRAVENOUS | Status: DC | PRN

## 2019-07-23 MED ORDER — SODIUM CHLORIDE 0.9% FLUSH: 3.0000 mL | INTRAVENOUS | Status: DC | PRN

## 2019-07-23 MED ORDER — SODIUM CHLORIDE 0.9% FLUSH: 3.0000 mL | Freq: Two times a day (BID) | INTRAVENOUS | Status: DC

## 2019-07-23 MED ORDER — KETOROLAC TROMETHAMINE 15 MG/ML IJ SOLN: 15.0000 mg | Freq: Four times a day (QID) | INTRAMUSCULAR | Status: DC | PRN

## 2019-07-23 MED ORDER — FLUTICASONE FUROATE-VILANTEROL 100-25 MCG/INH IN AEPB
1.0000 | INHALATION_SPRAY | Freq: Every day | RESPIRATORY_TRACT | Status: DC
  Administered 2019-07-23: 1 via RESPIRATORY_TRACT
  Filled 2019-07-23: qty 28

## 2019-07-23 MED ORDER — AMLODIPINE BESYLATE 2.5 MG PO TABS
2.5000 mg | ORAL_TABLET | Freq: Every day | ORAL | Status: DC
  Administered 2019-07-23: 2.5 mg via ORAL
  Filled 2019-07-23: qty 1

## 2019-07-23 MED ORDER — PNEUMOCOCCAL VAC POLYVALENT 25 MCG/0.5ML IJ INJ: 0.5000 mL | INJECTION | INTRAMUSCULAR | Status: DC

## 2019-07-23 MED ORDER — SENNOSIDES-DOCUSATE SODIUM 8.6-50 MG PO TABS: 1.0000 | ORAL_TABLET | Freq: Every evening | ORAL | Status: DC | PRN

## 2019-07-23 MED ORDER — QUETIAPINE FUMARATE 100 MG PO TABS: 250.0000 mg | ORAL_TABLET | Freq: Every day | ORAL | Status: DC

## 2019-07-23 MED ORDER — ASPIRIN EC 81 MG PO TBEC
81.0000 mg | DELAYED_RELEASE_TABLET | Freq: Every day | ORAL | Status: DC
  Administered 2019-07-23: 81 mg via ORAL
  Filled 2019-07-23: qty 1

## 2019-07-23 MED ORDER — OLANZAPINE 5 MG PO TABS: 2.5000 mg | ORAL_TABLET | Freq: Every day | ORAL | Status: DC

## 2019-07-23 MED ORDER — ONDANSETRON HCL 4 MG/2ML IJ SOLN: 4.0000 mg | Freq: Four times a day (QID) | INTRAMUSCULAR | Status: DC | PRN

## 2019-07-23 MED ORDER — ACETAMINOPHEN 325 MG PO TABS: 650.0000 mg | ORAL_TABLET | Freq: Four times a day (QID) | ORAL | Status: DC | PRN

## 2019-07-23 NOTE — Plan of Care (Signed)
  Problem: Activity: Goal: Risk for activity intolerance will decrease Outcome: Progressing   

## 2019-07-23 NOTE — ED Notes (Signed)
Attempted to call report

## 2019-07-23 NOTE — Evaluation (Signed)
Physical Therapy Evaluation and Discharge Patient Details Name: Kim Blankenship MRN: [id number] DOB: 06/14/1945 Today's Date: 07/23/2019   History of Present Illness  Pt is a 74 y/o female admitted secondary to acute respiratory failure from suspected opiate overdose. PMH inlcudes COPD, dementia, a fib, HTN, vulvar cancer, and R THA.   Clinical Impression  Patient evaluated by Physical Therapy with no further acute PT needs identified. All education has been completed and the patient has no further questions. Pt at a supervision to independent level with mobility tasks. Was able to perform DGI tasks without LOB. Cognitive deficits present, however, likely baseline dementia. Per pt, she lives with daughter; will need to ensure daughter can provide appropriate supervision. See below for any follow-up Physical Therapy or equipment needs. PT is signing off. Thank you for this referral. If needs change, please re-consult.      Follow Up Recommendations No PT follow up;Supervision/Assistance - 24 hour    Equipment Recommendations  None recommended by PT    Recommendations for Other Services       Precautions / Restrictions Precautions Precautions: Fall Restrictions Weight Bearing Restrictions: No      Mobility  Bed Mobility Overal bed mobility: Independent                Transfers Overall transfer level: Independent                  Ambulation/Gait Ambulation/Gait assistance: Supervision Gait Distance (Feet): 250 Feet Assistive device: None Gait Pattern/deviations: WFL(Within Functional Limits) Gait velocity: Decreased   General Gait Details: Overall steady gait. Able to perform dynamic gait tasks without LOB. Did report mild dizziness, however, states that it is likely from her not eating a good breakfast.   Stairs            Wheelchair Mobility    Modified Rankin (Stroke Patients Only)       Balance Overall balance assessment: Needs  assistance Sitting-balance support: No upper extremity supported;Feet supported Sitting balance-Leahy Scale: Normal     Standing balance support: During functional activity;No upper extremity supported Standing balance-Leahy Scale: Good                   Standardized Balance Assessment Standardized Balance Assessment : Dynamic Gait Index   Dynamic Gait Index Level Surface: Normal Change in Gait Speed: Normal Gait with Horizontal Head Turns: Normal Gait with Vertical Head Turns: Normal Step Around Obstacles: Normal       Pertinent Vitals/Pain Pain Assessment: No/denies pain    Home Living Family/patient expects to be discharged to:: Private residence Living Arrangements: Children Available Help at Discharge: Family Type of Home: Apartment Home Access: Level entry     Home Layout: One level Home Equipment: None      Prior Function Level of Independence: Needs assistance   Gait / Transfers Assistance Needed: INdependent with gait  ADL's / Homemaking Assistance Needed: Reports needing assist sometimes with bathing.         Hand Dominance   Dominant Hand: Right    Extremity/Trunk Assessment   Upper Extremity Assessment Upper Extremity Assessment: Overall WFL for tasks assessed    Lower Extremity Assessment Lower Extremity Assessment: Overall WFL for tasks assessed    Cervical / Trunk Assessment Cervical / Trunk Assessment: Normal  Communication   Communication: No difficulties  Cognition Arousal/Alertness: Awake/alert Behavior During Therapy: WFL for tasks assessed/performed Overall Cognitive Status: History of cognitive impairments - at baseline  General Comments: Dementia at baseline. Could not remember her daughter was helping care for someone in the hospital.       General Comments      Exercises     Assessment/Plan    PT Assessment Patent does not need any further PT services  PT  Problem List         PT Treatment Interventions      PT Goals (Current goals can be found in the Care Plan section)  Acute Rehab PT Goals Patient Stated Goal: to go home PT Goal Formulation: With patient Time For Goal Achievement: 08/06/19 Potential to Achieve Goals: Good    Frequency     Barriers to discharge        Co-evaluation               AM-PAC PT "6 Clicks" Mobility  Outcome Measure Help needed turning from your back to your side while in a flat bed without using bedrails?: None Help needed moving from lying on your back to sitting on the side of a flat bed without using bedrails?: None Help needed moving to and from a bed to a chair (including a wheelchair)?: None Help needed standing up from a chair using your arms (e.g., wheelchair or bedside chair)?: None Help needed to walk in hospital room?: None Help needed climbing 3-5 steps with a railing? : A Little 6 Click Score: 23    End of Session Equipment Utilized During Treatment: Gait belt Activity Tolerance: Patient tolerated treatment well Patient left: in bed;with call bell/phone within reach;with bed alarm set Nurse Communication: Mobility status PT Visit Diagnosis: Other abnormalities of gait and mobility (R26.89)    Time: [id number] PT Time Calculation (min) (ACUTE ONLY): 13 min   Charges:   PT Evaluation $PT Eval Low Complexity: 1 Low          Lou Miner, DPT  Acute Rehabilitation Services  Pager: [phone] Office: [phone]   Rudean Hitt 07/23/2019, 12:52 PM

## 2019-07-23 NOTE — Social Work (Signed)
Taxi voucher provided for patient's discharge home.  Criss Alvine, MSW, [employer]

## 2019-07-23 NOTE — Discharge Summary (Signed)
Physician Discharge Summary  Kim Blankenship EHO:[id number] DOB: 1945/05/20 DOA: 07/22/2019  PCP: Gregor Hams, FNP  Admit date: 07/22/2019 Discharge date: 07/23/2019  Admitted From: home Discharge disposition: home   Recommendations for Outpatient Follow-Up:   1. Follow up with PCP 2-3 weeks for evaluation of meds and home safety plan    Discharge Diagnosis:   Principal Problem:   Acute respiratory failure with hypoxia ([place]) Active Problems:   PAF (paroxysmal atrial fibrillation) ([facility])   Acute encephalopathy   Overdose opiate, accidental or unintentional, initial encounter ([place])   COPD (chronic obstructive pulmonary disease) ([place])   Essential hypertension   Adjustment disorder with mixed disturbance of emotions and conduct   Vulvar cancer ([place])    Discharge Condition: Improved.  Diet recommendation:   Regular.  Wound care: None.  Code status: Full.   History of Present Illness:   Kim Blankenship is a 74 y.o. female with medical history significant for COPD, atrial fibrillation not anticoagulated due to history of GI bleeding, worsening dementia per report of her daughter, hypertension, history of CVA, and vulvar cancer presented 4/22 to the emergency department after she was noted to be wandering her apartment complex and appearing confused.  Patient's daughter provided most of the history and reported that she usually manages the patient's medications including hydrocodone that she takes for pain related to her vulvar cancer, but patient was managing her own medications on that day as daughter was with her sick spouse in the hospital.  Patient's daughter reported that the patient has dementia that has been progressively worsening in recent months but there has not been any acute change.  Patient complained of pain related to the vulvar cancer, but no new complaints recently.  She had not had a seizure in a long time and no longer takes antiepileptic.  She has not  been drinking alcohol.  Upon arrival to the ED, patient  found to be afebrile, saturating mid 90s on room air, and with stable blood pressure.  EKG features a sinus rhythm, noncontrast head CT negative for acute abnormality, and no acute findings noted on chest x-ray.  Chemistry panel and CBC are unremarkable.  Lactic acid  normal.  Ammonia level normal and ethanol undetectable.  Patient was treated with Narcan in the ED and became alert and agitated.  She became somnolent and slightly hypoxic 2 hours later and again became alert and agitated after Narcan.  Hospital Course by Problem:   1. Acute encephalopathy; suspected opiate overdose resolved at discharge - Presented with somnolence and confusion after found wandering her apartment complex, became more somnolent in ED, woke and became agitated after Narcan  - Per report of patient's daughter, the patient has been taking hydrocodone for pain related to vulvar cancer, usually has daughter managing medications but managed her own that day as daughter was visiting sick spouse in hospital  - No acute findings on CT head, basic labs and ammonia level normal, has responded to Narcan, and this is likely related to accidental opiate overdose superimposed on dementia  - spoke with daughter and has plan for safer medication administration    2. Hypertension stable.    3. PAF remained in SR.  - CHADS-VASc is 89 (age, gender, CVA x2, HTN)  - Not anticoagulated d/t hx of GIB    4. COPD stable at baseline - Continue Trelegy, as-needed albuterol    5. Vulvar cancer  - Scheduled for upcoming surgery   6. History of CVA  -  No acute findings on head CT  - Cotinue ASA and statin    7. Dementia; depression; anxiety  - Patient has worsening dementia per report of her daughter, becomes agitated at times, and will be continued on Zyprexa and Seroquel       Medical Consultants:      Discharge Exam:   Vitals:   07/23/19 0737 07/23/19 0812    BP: 130/69   Pulse: (!) 59   Resp: 17   Temp: 98 F (36.7 C)   SpO2: 90% 91%   Vitals:   07/23/19 0203 07/23/19 0359 07/23/19 0737 07/23/19 0812  BP: 125/65 133/65 130/69   Pulse: 64 63 (!) 59   Resp: _0 Temp: (!) 97.3 F (36.3 C) 97.7 F (36.5 C) 98 F (36.7 C)   TempSrc: Oral Axillary Oral   SpO2: 90% 90% 90% 91%    General exam: Appears calm and comfortable.  Respiratory system: Clear to auscultation. Respiratory effort normal. Cardiovascular system: S1 & S2 heard, RRR. No JVD,  rubs, gallops or clicks. No murmurs. Gastrointestinal system: Abdomen is nondistended, soft and nontender. No organomegaly or masses felt. Normal bowel sounds heard. Central nervous system: Alert and oriented. No focal neurological deficits. Extremities: No clubbing,  or cyanosis. No edema. Skin: No rashes, lesions or ulcers. Psychiatry: Judgement and insight appear normal. Mood & affect appropriate.    The results of significant diagnostics from this hospitalization (including imaging, microbiology, ancillary and laboratory) are listed below for reference.     Procedures and Diagnostic Studies:   CT Head Wo Contrast  Result Date: 07/22/2019 CLINICAL DATA:  Disoriented.  Altered mental status. EXAM: CT HEAD WITHOUT CONTRAST TECHNIQUE: Contiguous axial images were obtained from the base of the skull through the vertex without intravenous contrast. COMPARISON:  04/28/2019 FINDINGS: Brain: No evidence of acute infarction, hemorrhage, hydrocephalus, extra-axial collection or mass lesion/mass effect. Mild patchy areas of white matter hypoattenuation are noted consistent with chronic microvascular ischemic change. Vascular: No hyperdense vessel or unexpected calcification. Skull: Normal. Negative for fracture or focal lesion. Sinuses/Orbits: Globes and orbits are unremarkable. Visualized sinuses are clear. Other: None. IMPRESSION: 1. No acute intracranial abnormalities. 2. Mild chronic  microvascular ischemic change. Stable appearance from the prior head CT. Electronically Signed   By: Lajean Manes M.D.   On: 07/22/2019 19:25   DG Chest Port 1 View  Result Date: 07/22/2019 CLINICAL DATA:  Cough. EXAM: PORTABLE CHEST 1 VIEW COMPARISON:  04/28/2019 FINDINGS: Lower lung volumes from prior exam.The cardiomediastinal contours are normal. Pulmonary vasculature is normal. No consolidation, pleural effusion, or pneumothorax. No acute osseous abnormalities are seen. IMPRESSION: Hypoventilatory chest without acute abnormality. Electronically Signed   By: Keith Rake M.D.   On: 07/22/2019 19:10     Labs:   Basic Metabolic Panel: Recent Labs  Lab 07/22/19 1828 07/23/19 0407  NA 139 138  K 4.2 4.9  CL 101 102  CO2 26 26  GLUCOSE 95 95  BUN 11 9  CREATININE 0.87 0.80  CALCIUM 9.2 9.1   GFR CrCl cannot be calculated (Unknown ideal weight.). Liver Function Tests: Recent Labs  Lab 07/22/19 1828  AST 31  ALT 19  ALKPHOS 78  BILITOT 1.0  PROT 6.1*  ALBUMIN 3.7   No results for input(s): LIPASE, AMYLASE in the last 168 hours. Recent Labs  Lab 07/22/19 1828  AMMONIA 13   Coagulation profile No results for input(s): INR, PROTIME in the last 168 hours.  CBC: Recent Labs  Lab 07/22/19 1828 07/23/19 0407  WBC 6.4 6.3  HGB 13.3 12.8  HCT 41.1 38.6  MCV 98.6 97.0  PLT 248 245   Cardiac Enzymes: No results for input(s): CKTOTAL, CKMB, CKMBINDEX, TROPONINI in the last 168 hours. BNP: Invalid input(s): POCBNP CBG: No results for input(s): GLUCAP in the last 168 hours. D-Dimer No results for input(s): DDIMER in the last 72 hours. Hgb A1c No results for input(s): HGBA1C in the last 72 hours. Lipid Profile No results for input(s): CHOL, HDL, LDLCALC, TRIG, CHOLHDL, LDLDIRECT in the last 72 hours. Thyroid function studies No results for input(s): TSH, T4TOTAL, T3FREE, THYROIDAB in the last 72 hours.  Invalid input(s): FREET3 Anemia work up No results  for input(s): VITAMINB12, FOLATE, FERRITIN, TIBC, IRON, RETICCTPCT in the last 72 hours. Microbiology Recent Results (from the past 240 hour(s))  Respiratory Panel by RT PCR (Flu A&B, Covid) - Nasopharyngeal Swab     Status: None   Collection Time: 07/22/19 10:25 PM   Specimen: Nasopharyngeal Swab  Result Value Ref Range Status   SARS Coronavirus 2 by RT PCR NEGATIVE NEGATIVE Final    Comment: (NOTE) SARS-CoV-2 target nucleic acids are NOT DETECTED. The SARS-CoV-2 RNA is generally detectable in upper respiratoy specimens during the acute phase of infection. The lowest concentration of SARS-CoV-2 viral copies this assay can detect is 131 copies/mL. A negative result does not preclude SARS-Cov-2 infection and should not be used as the sole basis for treatment or other patient management decisions. A negative result may occur with  improper specimen collection/handling, submission of specimen other than nasopharyngeal swab, presence of viral mutation(s) within the areas targeted by this assay, and inadequate number of viral copies (<131 copies/mL). A negative result must be combined with clinical observations, patient history, and epidemiological information. The expected result is Negative. Fact Sheet for Patients:  PinkCheek.be Fact Sheet for Healthcare Providers:  GravelBags.it This test is not yet ap proved or cleared by the [place] FDA and  has been authorized for detection and/or diagnosis of SARS-CoV-2 by FDA under an Emergency Use Authorization (EUA). This EUA will remain  in effect (meaning this test can be used) for the duration of the COVID-19 declaration under Section 564(b)(1) of the Act, 21 U.S.C. section 360bbb-3(b)(1), unless the authorization is terminated or revoked sooner.    Influenza A by PCR NEGATIVE NEGATIVE Final   Influenza B by PCR NEGATIVE NEGATIVE Final    Comment: (NOTE) The Xpert Xpress  SARS-CoV-2/FLU/RSV assay is intended as an aid in  the diagnosis of influenza from Nasopharyngeal swab specimens and  should not be used as a sole basis for treatment. Nasal washings and  aspirates are unacceptable for Xpert Xpress SARS-CoV-2/FLU/RSV  testing. Fact Sheet for Patients: PinkCheek.be Fact Sheet for Healthcare Providers: GravelBags.it This test is not yet approved or cleared by the [place] FDA and  has been authorized for detection and/or diagnosis of SARS-CoV-2 by  FDA under an Emergency Use Authorization (EUA). This EUA will remain  in effect (meaning this test can be used) for the duration of the  Covid-19 declaration under Section 564(b)(1) of the Act, 21  U.S.C. section 360bbb-3(b)(1), unless the authorization is  terminated or revoked. Performed at [facility], [place] [address], [place], [place] [address]      Discharge Instructions:   Discharge Instructions    Call MD for:  persistant dizziness or light-headedness   Complete by: As directed    Call MD for:  temperature >100.4  Complete by: As directed    Diet - low sodium heart healthy   Complete by: As directed    Discharge instructions   Complete by: As directed    Take medications as prescribed Follow up with PCP 2-3 weeks for evaluation of medications   Increase activity slowly   Complete by: As directed      Allergies as of 07/23/2019      Reactions   Tape Other (See Comments)   THE PATIENT'S SKIN IS THIN AND TEARS VERY EASILY (she "picks" at it; please use coban wrap)      Medication List    STOP taking these medications   levETIRAcetam 500 MG tablet Commonly known as: KEPPRA   lidocaine 5 % ointment Commonly known as: XYLOCAINE   metoprolol tartrate 25 MG tablet Commonly known as: LOPRESSOR     TAKE these medications   albuterol 108 (90 Base) MCG/ACT inhaler Commonly known as: VENTOLIN HFA Inhale 2 puffs into the  lungs every 4 (four) hours as needed for wheezing or shortness of breath.   amLODipine 2.5 MG tablet Commonly known as: NORVASC Take 2.5 mg by mouth daily.   aspirin EC 81 MG tablet Take 81 mg by mouth daily.   atorvastatin 20 MG tablet Commonly known as: LIPITOR Take 20 mg by mouth daily.   calcium carbonate 500 MG chewable tablet Commonly known as: TUMS - dosed in mg elemental calcium Chew 1 tablet by mouth daily.   ferrous sulfate 325 (65 FE) MG tablet Take 1 tablet (325 mg total) by mouth 2 (two) times daily with a meal.   folic acid 1 MG tablet Commonly known as: FOLVITE Take 1 tablet (1 mg total) by mouth daily.   ipratropium-albuterol 0.5-2.5 (3) MG/3ML Soln Commonly known as: DUONEB Take 3 mLs by nebulization every 6 (six) hours as needed (sob).   multivitamin with minerals Tabs tablet Take 1 tablet by mouth daily.   naloxone 4 MG/0.1ML Liqd nasal spray kit Commonly known as: NARCAN Place 1 spray into the nose daily as needed (For overdose).   OLANZapine 2.5 MG tablet Commonly known as: ZYPREXA Take 2.5 mg by mouth at bedtime.   QUEtiapine 100 MG tablet Commonly known as: SEROQUEL Take 250 mg by mouth at bedtime.   spironolactone 25 MG tablet Commonly known as: ALDACTONE Take 25 mg by mouth daily.   thiamine 100 MG tablet Take 1 tablet (100 mg total) by mouth daily.   Trelegy Ellipta 100-62.5-25 MCG/INH Aepb Generic drug: Fluticasone-Umeclidin-Vilant Inhale 1 puff into the lungs in the morning, at noon, in the evening, and at bedtime.         Time coordinating discharge: NP  Signed:  Radene Gunning NP  [facility] 07/23/2019, 1:23 PM

## 2019-07-23 NOTE — Progress Notes (Signed)
Patient walked around the hallway twice with NT. Did not need any equipment or assistance. Denies pain or discomfort.

## 2019-08-06 ENCOUNTER — Encounter (HOSPITAL_COMMUNITY): Payer: Self-pay

## 2019-08-06 ENCOUNTER — Emergency Department (HOSPITAL_COMMUNITY)
Admission: EM | Admit: 2019-08-06 | Discharge: 2019-08-07 | Disposition: A | Discharge status: Home / Self Care | Payer: Medicare Other | Attending: Emergency Medicine | Admitting: Emergency Medicine

## 2019-08-06 ENCOUNTER — Other Ambulatory Visit: Payer: Self-pay

## 2019-08-06 DIAGNOSIS — J45909 Unspecified asthma, uncomplicated: Secondary | ICD-10-CM | POA: Diagnosis not present

## 2019-08-06 DIAGNOSIS — T50901A Poisoning by unspecified drugs, medicaments and biological substances, accidental (unintentional), initial encounter: Secondary | ICD-10-CM | POA: Diagnosis not present

## 2019-08-06 DIAGNOSIS — I2699 Other pulmonary embolism without acute cor pulmonale: Secondary | ICD-10-CM | POA: Insufficient documentation

## 2019-08-06 DIAGNOSIS — Z79899 Other long term (current) drug therapy: Secondary | ICD-10-CM | POA: Diagnosis not present

## 2019-08-06 DIAGNOSIS — Z96641 Presence of right artificial hip joint: Secondary | ICD-10-CM | POA: Insufficient documentation

## 2019-08-06 DIAGNOSIS — Z8673 Personal history of transient ischemic attack (TIA), and cerebral infarction without residual deficits: Secondary | ICD-10-CM | POA: Insufficient documentation

## 2019-08-06 DIAGNOSIS — I110 Hypertensive heart disease with heart failure: Secondary | ICD-10-CM | POA: Diagnosis not present

## 2019-08-06 DIAGNOSIS — C539 Malignant neoplasm of cervix uteri, unspecified: Secondary | ICD-10-CM | POA: Insufficient documentation

## 2019-08-06 DIAGNOSIS — Z87891 Personal history of nicotine dependence: Secondary | ICD-10-CM | POA: Insufficient documentation

## 2019-08-06 DIAGNOSIS — Z7982 Long term (current) use of aspirin: Secondary | ICD-10-CM | POA: Diagnosis not present

## 2019-08-06 DIAGNOSIS — I5032 Chronic diastolic (congestive) heart failure: Secondary | ICD-10-CM | POA: Diagnosis not present

## 2019-08-06 HISTORY — DX: Malignant (primary) neoplasm, unspecified: C80.1

## 2019-08-06 NOTE — ED Triage Notes (Signed)
Pt found unresponsive by daughter who administered a dose of IN narcan withsome improvement. Daughter did approx 60 - 90 seconds of chest compressions.   Pt unsure if she took any oxycodone. Sts she is in control of her medications at home.   Only distended abdomen that is slowly improving per pt.

## 2019-08-06 NOTE — ED Provider Notes (Signed)
[facility] DEPT Provider Note  CSN: [id number] Arrival date & time: 08/06/19 2121  Chief Complaint(s) Possible Overdose  HPI Kim Blankenship is a 74 y.o. female   HPI   Last seen 5 min prior. Normal. 2030 was found unresponsive by daughter with blue lips and limp. No seizure activity Daughter did mouth to mouth and CPR. She also gave IN Narcan She responded 3-4 min after. When came to she was slow to respond, oriented.  Daughter manages medication. Reports that most recent vicodine Rx from early April had 5-6 pills left, which she hid, but when she went to find the bottle in the hiding place, the daughter did not find it.  Daughter reports that the patient's has been in her usual state of health.  She did report that she has been a bit more depressed, as she has been coming to terms with her prognosis from cancer.  Patient does not remember the episode, but does remember waking up. Denies taking any pain medication. States that she "threw" the Vicodin away.  Has not current complaints.   Past Medical History Past Medical History:  Diagnosis Date  . Asthma   . Atrial fibrillation ([place])   . Cancer ([place])    cervical  . COPD (chronic obstructive pulmonary disease) ([place])   . Hypertension   . Irregular heart rate   . PRES (posterior reversible encephalopathy syndrome)   . Stroke [facility])    Patient Active Problem List   Diagnosis Date Noted  . Acute respiratory failure with hypoxia ([place]) 07/23/2019  . Acute encephalopathy 07/22/2019  . Overdose opiate, accidental or unintentional, initial encounter ([place]) 07/22/2019  . 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 Delirium   . GIB (gastrointestinal bleeding) 12/04/2018  . Chronic diastolic CHF (congestive heart failure) ([place]) 12/04/2018  . 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 Hyponatremia 11/20/2016  . UTI (urinary tract  infection) 11/20/2016  . HLD (hyperlipidemia) 11/20/2016  . GERD (gastroesophageal reflux disease) 11/20/2016  . Depression 11/20/2016  . Hypertensive emergency 11/04/2016  . EtOH dependence ([place]) 11/04/2016  . Hyperglycemia 11/04/2016  . Essential hypertension   . Hypertensive urgency 10/30/2016  . PAF (paroxysmal atrial fibrillation) ([place]) 10/30/2016  . PRES (posterior reversible encephalopathy syndrome) 10/30/2016  . Seizure ([place])   . Acute blood loss anemia   . Closed fracture of right hip ([place]) 02/09/2016  . Chest pain 11/07/2015  . COPD (chronic obstructive pulmonary disease) ([place]) 08/19/2013  . Abnormal EKG 08/19/2013  . Fever, unspecified 08/19/2013   Home Medication(s) Prior to Admission medications   Medication Sig Start Date End Date Taking? Authorizing Provider  albuterol (VENTOLIN HFA) 108 (90 Base) MCG/ACT inhaler Inhale 2 puffs into the lungs every 4 (four) hours as needed for wheezing or shortness of breath.   Yes [provider]  amLODipine (NORVASC) 2.5 MG tablet Take 2.5 mg by mouth daily.   Yes [provider]  aspirin EC 81 MG tablet Take 81 mg by mouth daily.   Yes [provider]  atorvastatin (LIPITOR) 20 MG tablet Take 20 mg by mouth daily. 04/08/16  Yes [provider]  calcium carbonate (TUMS - DOSED IN MG ELEMENTAL CALCIUM) 500 MG chewable tablet Chew 1 tablet by mouth daily.   Yes [provider]  Fluticasone-Umeclidin-Vilant (TRELEGY ELLIPTA) 100-62.5-25 MCG/INH AEPB Inhale 1 puff into the lungs in the morning, at noon, in the evening, and at bedtime. 06/15/19  Yes  [provider]  ipratropium-albuterol (DUONEB) 0.5-2.5 (3) MG/3ML SOLN Take 3 mLs by nebulization every 6 (six) hours as needed (sob).   Yes [provider]  Multiple Vitamin (MULTIVITAMIN WITH MINERALS) TABS tablet Take 1 tablet by mouth daily. 12/11/18  Yes Hongalgi, Lenis Dickinson, MD  naloxone [facility]) nasal spray 4 mg/0.1 mL Place 1 spray  into the nose daily as needed (For overdose).    Yes [provider]  OLANZapine (ZYPREXA) 2.5 MG tablet Take 2.5 mg by mouth at bedtime.   Yes [provider]  QUEtiapine (SEROQUEL) 100 MG tablet Take 200 mg by mouth at bedtime.    Yes [provider]  spironolactone (ALDACTONE) 25 MG tablet Take 25 mg by mouth daily.   Yes [provider]  thiamine 100 MG tablet Take 1 tablet (100 mg total) by mouth daily. 12/11/18  Yes Hongalgi, Lenis Dickinson, MD  ferrous sulfate 325 (65 FE) MG tablet Take 1 tablet (325 mg total) by mouth 2 (two) times daily with a meal. Patient not taking: Reported on 07/22/2019 12/10/18   Modena Jansky, MD  folic acid (FOLVITE) 1 MG tablet Take 1 tablet (1 mg total) by mouth daily. Patient not taking: Reported on 07/22/2019 12/11/18   Modena Jansky, MD                                                                                                                                    Past Surgical History Past Surgical History:  Procedure Laterality Date  . CESAREAN SECTION    . Left Foot Reconstruction    . OPEN REDUCTION INTERNAL FIXATION (ORIF) METACARPAL Right 03/10/2015   Procedure: OPEN REDUCTION INTERNAL FIXATION (ORIF) RIGHT LONG  METACARPAL FRACTURE;  Surgeon: Leanora Cover, MD;  Location: [facility];  Service: Orthopedics;  Laterality: Right;  . TONSILLECTOMY    . TOTAL HIP ARTHROPLASTY Right 02/09/2016   Procedure: TOTAL HIP ARTHROPLASTY ANTERIOR APPROACH;  Surgeon: Frederik Pear, MD;  Location: [facility];  Service: Orthopedics;  Laterality: Right;   Family History Family History  Problem Relation Age of Onset  . Dementia Father     Social History Social History   Tobacco Use  . Smoking status: Former Smoker    Packs/day: 2.00    Years: 30.00    Pack years: 60.00    Types: Cigarettes    Quit date: 04/1995    Years since quitting: 24.3  . Smokeless tobacco: Never Used  Substance Use Topics  . Alcohol use: Yes     Comment: wine daily  . Drug use: No   Allergies Tape  Review of Systems Review of Systems All other systems are reviewed and are negative for acute change except as noted in the HPI  Physical Exam Vital Signs  I have reviewed the triage vital signs BP (!) 150/80   Pulse 73   Temp 98.2 F (36.8 C) (Oral)  Resp 12   Ht 5\' 3"  (1.6 m)   Wt 55.8 kg   SpO2 90%   BMI 21.79 kg/m   Physical Exam Vitals reviewed.  Constitutional:      General: She is not in acute distress.    Appearance: She is well-developed. She is not diaphoretic.  HENT:     Head: Normocephalic and atraumatic.     Nose: Nose normal.  Eyes:     General: No scleral icterus.       Right eye: No discharge.        Left eye: No discharge.     Conjunctiva/sclera: Conjunctivae normal.     Pupils: Pupils are equal, round, and reactive to light.  Cardiovascular:     Rate and Rhythm: Normal rate and regular rhythm.     Heart sounds: No murmur. No friction rub. No gallop.   Pulmonary:     Effort: Pulmonary effort is normal. No respiratory distress.     Breath sounds: Normal breath sounds. No stridor. No rales.  Abdominal:     General: There is no distension.     Palpations: Abdomen is soft.     Tenderness: There is no abdominal tenderness.  Musculoskeletal:        General: No tenderness.     Cervical back: Normal range of motion and neck supple.  Skin:    General: Skin is warm and dry.     Findings: No erythema or rash.  Neurological:     Mental Status: She is alert and oriented to person, place, and time.     ED Results and Treatments Labs (all labs ordered are listed, but only abnormal results are displayed) Labs Reviewed  COMPREHENSIVE METABOLIC PANEL - Abnormal; Notable for the following components:      Result Value   Glucose, Bld 107 (*)    Total Protein 6.3 (*)    All other components within normal limits  SALICYLATE LEVEL - Abnormal; Notable for the following components:   Salicylate Lvl  [id number] (*)    All other components within normal limits  ACETAMINOPHEN LEVEL - Abnormal; Notable for the following components:   Acetaminophen (Tylenol), Serum <10 (*)    All other components within normal limits  CBC WITH DIFFERENTIAL/PLATELET - Abnormal; Notable for the following components:   WBC 11.8 (*)    Neutro Abs 9.6 (*)    All other components within normal limits  ACETAMINOPHEN LEVEL - Abnormal; Notable for the following components:   Acetaminophen (Tylenol), Serum <10 (*)    All other components within normal limits  CBG MONITORING, ED - Abnormal; Notable for the following components:   Glucose-Capillary 104 (*)    All other components within normal limits  ETHANOL  RAPID URINE DRUG SCREEN, HOSP PERFORMED                                                                                                                         EKG  EKG Interpretation  Date/Time:  Friday Aug 06 2019 23:58:48 EDT Ventricular Rate:  76 PR Interval:    QRS Duration: 100 QT Interval:  404 QTC Calculation: 455 R Axis:   81 Text Interpretation: Sinus rhythm Borderline right axis deviation No acute changes Confirmed by Addison Lank [id number]) on 08/07/2019 1:09:31 AM      Radiology CT Angio Chest PE W and/or Wo Contrast  Result Date: 08/07/2019 CLINICAL DATA:  74 year old female with concern for pulmonary embolism. EXAM: CT ANGIOGRAPHY CHEST WITH CONTRAST TECHNIQUE: Multidetector CT imaging of the chest was performed using the standard protocol during bolus administration of intravenous contrast. Multiplanar CT image reconstructions and MIPs were obtained to evaluate the vascular anatomy. CONTRAST:  129mL OMNIPAQUE IOHEXOL 350 MG/ML SOLN COMPARISON:  Chest radiograph dated 07/22/2019. FINDINGS: Cardiovascular: There is no cardiomegaly or pericardial effusion. Coronary vascular calcifications primarily involving the LAD and left circumflex artery. The thoracic aorta is unremarkable. There is a small  nonocclusive right upper lobe embolus of indeterminate age possibly subacute or old. Clinical correlation is recommended. Linear bandlike peripheral density at the right lower lobe pulmonary artery branch point (140/5) consistent with old PE or scarring. There is no CT evidence of right heart straining. Mediastinum/Nodes: No hilar or mediastinal adenopathy. The esophagus is grossly unremarkable. No mediastinal fluid collection. Lungs/Pleura: Background of mild centrilobular emphysema. A cluster of nodular density in the left upper lobe measuring up to 6 mm (25/6), likely post inflammatory/infection. Clinical correlation is recommended. No focal consolidation, pleural effusion, pneumothorax. The central airways are patent. Upper Abdomen: No acute abnormality. Musculoskeletal: Osteopenia with degenerative changes of the spine. Old lower thoracic compression fracture. No acute osseous pathology. Review of the MIP images confirms the above findings. IMPRESSION: 1. Small nonocclusive right upper lobe embolus of indeterminate age possibly subacute or old. Clinical correlation is recommended. 2. Cluster of nodular density in the left upper lobe, likely post inflammatory/infection or scarring. Non-contrast chest CT at 3-6 months is recommended. If the nodules are stable at time of repeat CT, then future CT at 18-24 months (from today's scan) is considered optional for low-risk patients, but is recommended for high-risk patients. This recommendation follows the consensus statement: Guidelines for Management of Incidental Pulmonary Nodules Detected on CT Images: From the Fleischner Society 2017; Radiology 2017; 284:228-243. 3. Aortic Atherosclerosis (ICD10-I70.0) and Emphysema (ICD10-J43.9). These results were called by telephone at the time of interpretation on 08/07/2019 at 2:21 am to provider [facility] , who verbally acknowledged these results. Electronically Signed   By: Anner Crete M.D.   On: 08/07/2019 02:22     Pertinent labs & imaging results that were available during my care of the patient were reviewed by me and considered in my medical decision making (see chart for details).  Medications Ordered in ED Medications  sodium chloride (PF) 0.9 % injection (  Not Given 08/07/19 0155)  iohexol (OMNIPAQUE) 350 MG/ML injection 100 mL (100 mLs Intravenous Contrast Given 08/07/19 0152)  Procedures .1-3 Lead EKG Interpretation Performed by: Fatima Blank, MD Authorized by: Fatima Blank, MD     Interpretation: normal     ECG rate:  75   ECG rate assessment: normal     Rhythm: sinus rhythm     Ectopy: none     Conduction: normal      (including critical care time)  Medical Decision Making / ED Course I have reviewed the nursing notes for this encounter and the patient's prior records (if available in EHR or on provided paperwork).   Kim Blankenship was evaluated in Emergency Department on 08/07/2019 for the symptoms described in the history of present illness. She was evaluated in the context of the global COVID-19 pandemic, which necessitated consideration that the patient might be at risk for infection with the SARS-CoV-2 virus that causes COVID-19. Institutional protocols and algorithms that pertain to the evaluation of patients at risk for COVID-19 are in a state of rapid change based on information released by regulatory bodies including the CDC and federal and state organizations. These policies and algorithms were followed during the patient's care in the ED.  Patient was found unresponsive by daughter with evidence of hypoxia.  He responded to Narcan.  Patient has a history of unintentional opiate overdose.  Questionable with the patient found the previously prescribed narcotics.  Screening labs grossly reassuring.  Acetaminophen level negative.   Will a 4-hour acetaminophen level.  Given her history of cancer, will also obtain a CT PE study to rule out acute PE.  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  It is unlikely the etiology of the patient's presentation today.  Patient has been monitoring on telemetry with normal sinus rhythm, no dysrhythmias or blocks noted.  Repeat acetaminophen level negative.  Daughter updated      Final Clinical Impression(s) / ED Diagnoses Final diagnoses:  Accidental drug overdose, initial encounter    The patient appears reasonably screened and/or stabilized for discharge and I doubt any other medical condition or other [facility] requiring further screening, evaluation, or treatment in the ED at this time prior to discharge. Safe for discharge with strict return precautions.  Disposition: Discharge  Condition: Good  I have discussed the results, Dx and Tx plan with the patient/family who expressed understanding and agree(s) with the plan. Discharge instructions discussed at length. The patient/family was given strict return precautions who verbalized understanding of the instructions. No further questions at time of discharge.    ED Discharge Orders    None       Follow Up: Gregor Hams, [place] [id number] [phone]        This chart was dictated using voice recognition software.  Despite best efforts to proofread,  errors can occur which can change the documentation meaning.   Fatima Blank, MD 08/07/19 [phone]

## 2019-08-07 ENCOUNTER — Emergency Department (HOSPITAL_COMMUNITY): Payer: Medicare Other

## 2019-08-07 ENCOUNTER — Encounter (HOSPITAL_COMMUNITY): Payer: Self-pay

## 2019-08-07 LAB — CBG MONITORING, ED: Glucose-Capillary: 104 mg/dL — ABNORMAL HIGH (ref 70–99)

## 2019-08-07 LAB — COMPREHENSIVE METABOLIC PANEL
ALT: 22 U/L (ref 0–44)
AST: 33 U/L (ref 15–41)
Albumin: 3.9 g/dL (ref 3.5–5.0)
Alkaline Phosphatase: 74 U/L (ref 38–126)
Anion gap: 10 (ref 5–15)
BUN: 11 mg/dL (ref 8–23)
CO2: 26 mmol/L (ref 22–32)
Calcium: 9 mg/dL (ref 8.9–10.3)
Chloride: 100 mmol/L (ref 98–111)
Creatinine, Ser: 0.8 mg/dL (ref 0.44–1.00)
GFR calc Af Amer: >60 mL/min (ref >60)
GFR calc non Af Amer: >60 mL/min (ref >60)
Glucose, Bld: 107 mg/dL — ABNORMAL HIGH (ref 70–99)
Potassium: 3.9 mmol/L (ref 3.5–5.1)
Sodium: 136 mmol/L (ref 135–145)
Total Bilirubin: 0.7 mg/dL (ref 0.3–1.2)
Total Protein: 6.3 g/dL — ABNORMAL LOW (ref 6.5–8.1)

## 2019-08-07 LAB — CBC WITH DIFFERENTIAL/PLATELET
Abs Immature Granulocytes: 0.04 10*3/uL (ref 0.00–0.07)
Basophils Absolute: 0 10*3/uL (ref 0.0–0.1)
Basophils Relative: 0 %
Eosinophils Absolute: 0.2 10*3/uL (ref 0.0–0.5)
Eosinophils Relative: 2 %
HCT: 37.7 % (ref 36.0–46.0)
Hemoglobin: 12.8 g/dL (ref 12.0–15.0)
Immature Granulocytes: 0 %
Lymphocytes Relative: 11 %
Lymphs Abs: 1.3 10*3/uL (ref 0.7–4.0)
MCH: 32.6 pg (ref 26.0–34.0)
MCHC: 34 g/dL (ref 30.0–36.0)
MCV: 95.9 fL (ref 80.0–100.0)
Monocytes Absolute: 0.7 10*3/uL (ref 0.1–1.0)
Monocytes Relative: 6 %
Neutro Abs: 9.6 10*3/uL — ABNORMAL HIGH (ref 1.7–7.7)
Neutrophils Relative %: 81 %
Platelets: 289 10*3/uL (ref 150–400)
RBC: 3.93 MIL/uL (ref 3.87–5.11)
RDW: 12.3 % (ref 11.5–15.5)
WBC: 11.8 10*3/uL — ABNORMAL HIGH (ref 4.0–10.5)
nRBC: 0 % (ref 0.0–0.2)

## 2019-08-07 LAB — RAPID URINE DRUG SCREEN, HOSP PERFORMED
Amphetamines: NOT DETECTED
Barbiturates: NOT DETECTED
Benzodiazepines: NOT DETECTED
Cocaine: NOT DETECTED
Opiates: NOT DETECTED
Tetrahydrocannabinol: NOT DETECTED

## 2019-08-07 LAB — ACETAMINOPHEN LEVEL
Acetaminophen (Tylenol), Serum: <10 ug/mL — ABNORMAL LOW (ref 10–30)
Acetaminophen (Tylenol), Serum: <10 ug/mL — ABNORMAL LOW (ref 10–30)

## 2019-08-07 LAB — ETHANOL: Alcohol, Ethyl (B): <10 mg/dL (ref <10)

## 2019-08-07 LAB — SALICYLATE LEVEL: Salicylate Lvl: <7 mg/dL — ABNORMAL LOW (ref 7.0–30.0)

## 2019-08-07 MED ORDER — IOHEXOL 350 MG/ML SOLN
100.0000 mL | Freq: Once | INTRAVENOUS | Status: AC | PRN
  Administered 2019-08-07: 100 mL via INTRAVENOUS

## 2019-08-07 MED ORDER — SODIUM CHLORIDE (PF) 0.9 % IJ SOLN
INTRAMUSCULAR | Status: AC
  Filled 2019-08-07: qty 50

## 2019-12-20 ENCOUNTER — Other Ambulatory Visit: Payer: Self-pay

## 2019-12-20 ENCOUNTER — Encounter (HOSPITAL_COMMUNITY): Payer: Self-pay

## 2019-12-20 ENCOUNTER — Emergency Department (HOSPITAL_COMMUNITY)
Admission: EM | Admit: 2019-12-20 | Discharge: 2019-12-21 | Disposition: A | Discharge status: Home / Self Care | Payer: Medicare Other | Attending: Emergency Medicine | Admitting: Emergency Medicine

## 2019-12-20 DIAGNOSIS — Z79899 Other long term (current) drug therapy: Secondary | ICD-10-CM | POA: Insufficient documentation

## 2019-12-20 DIAGNOSIS — Z87891 Personal history of nicotine dependence: Secondary | ICD-10-CM | POA: Diagnosis not present

## 2019-12-20 DIAGNOSIS — G4710 Hypersomnia, unspecified: Secondary | ICD-10-CM | POA: Diagnosis not present

## 2019-12-20 DIAGNOSIS — Z7951 Long term (current) use of inhaled steroids: Secondary | ICD-10-CM | POA: Diagnosis not present

## 2019-12-20 DIAGNOSIS — I110 Hypertensive heart disease with heart failure: Secondary | ICD-10-CM | POA: Diagnosis not present

## 2019-12-20 DIAGNOSIS — I5032 Chronic diastolic (congestive) heart failure: Secondary | ICD-10-CM | POA: Insufficient documentation

## 2019-12-20 DIAGNOSIS — J45909 Unspecified asthma, uncomplicated: Secondary | ICD-10-CM | POA: Insufficient documentation

## 2019-12-20 DIAGNOSIS — T50901A Poisoning by unspecified drugs, medicaments and biological substances, accidental (unintentional), initial encounter: Secondary | ICD-10-CM | POA: Diagnosis present

## 2019-12-20 DIAGNOSIS — J449 Chronic obstructive pulmonary disease, unspecified: Secondary | ICD-10-CM | POA: Diagnosis not present

## 2019-12-20 NOTE — ED Provider Notes (Signed)
[facility] DEPT Provider Note   CSN: [id number] Arrival date & time: 12/20/19  2242     History Chief Complaint  Patient presents with  . Drug Overdose    Kim Blankenship is a 74 y.o. female.  74 yo F with a chief complaints of a possible drug overdose.  The patient's daughter went in to check on her and felt like she was apneic and turning blue.  Call 911.  Was brought by EMS here.  Is not the first time the patient's had to go to the ED for possible opiate overdose.  The patient is awake and alert on my exam.  She denies any complaints.  Denies chest pain denies headache denies neck pain.  Denies cough congestion or fever.  Denies nausea or vomiting.  She does feel like she has been eating somewhat less and she is not sure why.  The history is provided by the patient.  Drug Overdose This is a recurrent problem. The current episode started less than 1 hour ago. The problem occurs constantly. The problem has been resolved. Pertinent negatives include no chest pain, no abdominal pain, no headaches and no shortness of breath. Nothing aggravates the symptoms. Nothing relieves the symptoms. She has tried nothing for the symptoms. The treatment provided no relief.       Past Medical History:  Diagnosis Date  . Asthma   . Atrial fibrillation ([place])   . Cancer ([place])    cervical  . COPD (chronic obstructive pulmonary disease) ([place])   . Hypertension   . Irregular heart rate   . PRES (posterior reversible encephalopathy syndrome)   . Stroke [facility])     Patient Active Problem List   Diagnosis Date Noted  . Acute respiratory failure with hypoxia ([place]) 07/23/2019  . Acute encephalopathy 07/22/2019  . Overdose opiate, accidental or unintentional, initial encounter ([place]) 07/22/2019  . Vulvar cancer ([place]) 07/22/2019  . Delirium   . GIB (gastrointestinal bleeding) 12/04/2018  . Chronic diastolic CHF (congestive heart failure) ([place]) 12/04/2018  . 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 Hyponatremia 11/20/2016  . UTI (urinary tract infection) 11/20/2016  . HLD (hyperlipidemia) 11/20/2016  . GERD (gastroesophageal reflux disease) 11/20/2016  . Depression 11/20/2016  . Hypertensive emergency 11/04/2016  . EtOH dependence ([place]) 11/04/2016  . Hyperglycemia 11/04/2016  . Essential hypertension   . Hypertensive urgency 10/30/2016  . PAF (paroxysmal atrial fibrillation) ([place]) 10/30/2016  . PRES (posterior reversible encephalopathy syndrome) 10/30/2016  . Seizure ([place])   . Acute blood loss anemia   . Closed fracture of right hip ([place]) 02/09/2016  . Chest pain 11/07/2015  . COPD (chronic obstructive pulmonary disease) ([place]) 08/19/2013  . Abnormal EKG 08/19/2013  . Fever, unspecified 08/19/2013    Past Surgical History:  Procedure Laterality Date  . CESAREAN SECTION    . Left Foot Reconstruction    . OPEN REDUCTION INTERNAL FIXATION (ORIF) METACARPAL Right 03/10/2015   Procedure: OPEN REDUCTION INTERNAL FIXATION (ORIF) RIGHT LONG  METACARPAL FRACTURE;  Surgeon: Leanora Cover, MD;  Location: [facility];  Service: Orthopedics;  Laterality: Right;  . TONSILLECTOMY    . TOTAL HIP ARTHROPLASTY Right 02/09/2016   Procedure: TOTAL HIP ARTHROPLASTY ANTERIOR APPROACH;  Surgeon: Frederik Pear, MD;  [facility];  Service: Orthopedics;  Laterality: Right;     OB History    Gravida      Para  Term      Preterm      AB      Living  1     SAB      TAB      Ectopic      Multiple      Live Births              Family History  Problem Relation Age of Onset  . Dementia Father     Social History   Tobacco Use  . Smoking status: Former Smoker    Packs/day: 2.00    Years: 30.00    Pack years: 60.00    Types: Cigarettes    Quit date: 04/1995    Years since quitting: 24.7  . Smokeless tobacco: Never Used  Vaping Use  . Vaping  Use: Never used  Substance Use Topics  . Alcohol use: Yes    Comment: wine daily  . Drug use: No    Home Medications Prior to Admission medications   Medication Sig Start Date End Date Taking? Authorizing Provider  albuterol (VENTOLIN HFA) 108 (90 Base) MCG/ACT inhaler Inhale 2 puffs into the lungs every 4 (four) hours as needed for wheezing or shortness of breath.   Yes [provider]  amLODipine (NORVASC) 2.5 MG tablet Take 2.5 mg by mouth daily.   Yes [provider]  budesonide-formoterol (SYMBICORT) 160-4.5 MCG/ACT inhaler Inhale 2 puffs into the lungs 2 (two) times daily.   Yes [provider]  folic acid (FOLVITE) 1 MG tablet Take 1 tablet (1 mg total) by mouth daily. 12/11/18  Yes Hongalgi, Lenis Dickinson, MD  HYDROcodone-acetaminophen (NORCO/VICODIN) 5-325 MG tablet Take 1 tablet by mouth every 6 (six) hours as needed for moderate pain. For up to 7 days   Yes [provider]  OLANZapine (ZYPREXA) 2.5 MG tablet Take 2.5 mg by mouth at bedtime.   Yes [provider]  QUEtiapine (SEROQUEL) 100 MG tablet Take 200 mg by mouth at bedtime.    Yes [provider]  spironolactone (ALDACTONE) 25 MG tablet Take 25 mg by mouth daily.   Yes [provider]  thiamine 100 MG tablet Take 1 tablet (100 mg total) by mouth daily. 12/11/18  Yes Hongalgi, Lenis Dickinson, MD  tiZANidine (ZANAFLEX) 2 MG tablet Take 2 mg by mouth at bedtime. Prn for muscle spasm   Yes [provider]  aspirin EC 81 MG tablet Take 81 mg by mouth daily.    [provider]  atorvastatin (LIPITOR) 20 MG tablet Take 20 mg by mouth daily. 04/08/16   [provider]  calcium carbonate (TUMS - DOSED IN MG ELEMENTAL CALCIUM) 500 MG chewable tablet Chew 1 tablet by mouth daily.    [provider]  ferrous sulfate 325 (65 FE) MG tablet Take 1 tablet (325 mg total) by mouth 2 (two) times daily with a meal. Patient not taking: Reported on 07/22/2019 12/10/18    Modena Jansky, MD  Fluticasone-Umeclidin-Vilant (TRELEGY ELLIPTA) 100-62.5-25 MCG/INH AEPB Inhale 1 puff into the lungs in the morning, at noon, in the evening, and at bedtime. 06/15/19   [provider]  ipratropium-albuterol (DUONEB) 0.5-2.5 (3) MG/3ML SOLN Take 3 mLs by nebulization every 6 (six) hours as needed (sob).    [provider]  Multiple Vitamin (MULTIVITAMIN WITH MINERALS) TABS tablet Take 1 tablet by mouth daily. 12/11/18   Hongalgi, Lenis Dickinson, MD  naloxone [facility]) nasal spray 4 mg/0.1 mL Place 1 spray into the nose daily  as needed (For overdose).     [provider]    Allergies    Tape  Review of Systems   Review of Systems  Constitutional: Positive for activity change and appetite change. Negative for chills and fever.  HENT: Negative for congestion and rhinorrhea.   Eyes: Negative for redness and visual disturbance.  Respiratory: Negative for shortness of breath and wheezing.   Cardiovascular: Negative for chest pain and palpitations.  Gastrointestinal: Negative for abdominal pain, nausea and vomiting.  Genitourinary: Negative for dysuria and urgency.  Musculoskeletal: Negative for arthralgias and myalgias.  Skin: Negative for pallor and wound.  Neurological: Negative for dizziness and headaches.    Physical Exam Updated Vital Signs BP (!) 154/73 (BP Location: Right Arm)   Pulse 63   Temp (!) 97.1 F (36.2 C) (Axillary)   Resp 14   SpO2 100%   Physical Exam Vitals and nursing note reviewed.  Constitutional:      General: She is not in acute distress.    Appearance: She is well-developed. She is not diaphoretic.  HENT:     Head: Normocephalic and atraumatic.  Eyes:     Pupils: Pupils are equal, round, and reactive to light.  Cardiovascular:     Rate and Rhythm: Normal rate and regular rhythm.     Heart sounds: No murmur heard.  No friction rub. No gallop.   Pulmonary:     Effort: Pulmonary effort is normal.     Breath  sounds: No wheezing or rales.  Abdominal:     General: There is no distension.     Palpations: Abdomen is soft.     Tenderness: There is no abdominal tenderness. There is no guarding.  Musculoskeletal:        General: No tenderness.     Cervical back: Normal range of motion and neck supple.  Skin:    General: Skin is warm and dry.  Neurological:     Mental Status: She is alert and oriented to person, place, and time.  Psychiatric:        Behavior: Behavior normal.     ED Results / Procedures / Treatments   Labs (all labs ordered are listed, but only abnormal results are displayed) Labs Reviewed - No data to display  EKG None  Radiology No results found.  Procedures Procedures (including critical care time)  Medications Ordered in ED Medications - No data to display  ED Course  I have reviewed the triage vital signs and the nursing notes.  Pertinent labs & imaging results that were available during my care of the patient were reviewed by me and considered in my medical decision making (see chart for details).    MDM Rules/Calculators/A&P                          74 yo F with a chief complaint of a possible drug overdose.  The daughter went to check on her in bed and felt like she was apneic and turning blue.  Brought by EMS.  Not requiring any medication.  Awake and alert on my exam.  She is confused about why she is here.  Denies other complaint.  Will observe in the ED for short time.  Reassess.  The patient has been in the ED for an hour and has not required any intervention.  Maintaining her airway without issue.  Awake and alert.  No hypoxia.  Will discharge the patient home.  PCP  follow-up.  11:52 PM:  I have discussed the diagnosis/risks/treatment options with the patient and believe the pt to be eligible for discharge home to follow-up with PCP. We also discussed returning to the ED immediately if new or worsening sx occur. We discussed the sx which are most  concerning (e.g., sudden worsening pain, fever, inability to tolerate by mouth) that necessitate immediate return. Medications administered to the patient during their visit and any new prescriptions provided to the patient are listed below.  Medications given during this visit Medications - No data to display   The patient appears reasonably screen and/or stabilized for discharge and I doubt any other medical condition or other [facility] requiring further screening, evaluation, or treatment in the ED at this time prior to discharge.    Final Clinical Impression(s) / ED Diagnoses Final diagnoses:  Excessive sleepiness    Rx / DC Orders ED Discharge Orders    None       Deno Etienne, DO 12/20/19 2352

## 2019-12-20 NOTE — ED Triage Notes (Signed)
Patient arrived via [facility] after a possible overdose on pain medication. Pupils pinpoint. Responds to verbal stimuli. Reports taking her medication but declines more than prescribed.

## 2019-12-20 NOTE — Discharge Instructions (Signed)
Please call your family doctor and discuss with them that this is at least the third time that you have been sent to the emergency department for what was thought to be medication reaction.  They may need to look through your medications and see if you need to be on a different medication or something needs to be taken off entirely.

## 2020-04-25 ENCOUNTER — Emergency Department (HOSPITAL_COMMUNITY): Payer: Medicare Other

## 2020-04-25 ENCOUNTER — Inpatient Hospital Stay (HOSPITAL_COMMUNITY)
Admission: EM | Admit: 2020-04-25 | Discharge: 2020-04-27 | DRG: 917 | Disposition: A | Discharge status: Home / Self Care | Payer: Medicare Other | Attending: Internal Medicine | Admitting: Internal Medicine

## 2020-04-25 ENCOUNTER — Encounter (HOSPITAL_COMMUNITY): Payer: Self-pay

## 2020-04-25 ENCOUNTER — Other Ambulatory Visit: Payer: Self-pay

## 2020-04-25 DIAGNOSIS — G9341 Metabolic encephalopathy: Secondary | ICD-10-CM | POA: Diagnosis not present

## 2020-04-25 DIAGNOSIS — Y929 Unspecified place or not applicable: Secondary | ICD-10-CM

## 2020-04-25 DIAGNOSIS — G9340 Encephalopathy, unspecified: Secondary | ICD-10-CM | POA: Diagnosis present

## 2020-04-25 DIAGNOSIS — R410 Disorientation, unspecified: Secondary | ICD-10-CM | POA: Diagnosis not present

## 2020-04-25 DIAGNOSIS — J449 Chronic obstructive pulmonary disease, unspecified: Secondary | ICD-10-CM | POA: Diagnosis not present

## 2020-04-25 DIAGNOSIS — I160 Hypertensive urgency: Secondary | ICD-10-CM

## 2020-04-25 DIAGNOSIS — G928 Other toxic encephalopathy: Secondary | ICD-10-CM | POA: Diagnosis present

## 2020-04-25 DIAGNOSIS — Z8673 Personal history of transient ischemic attack (TIA), and cerebral infarction without residual deficits: Secondary | ICD-10-CM

## 2020-04-25 DIAGNOSIS — I480 Paroxysmal atrial fibrillation: Secondary | ICD-10-CM | POA: Diagnosis present

## 2020-04-25 DIAGNOSIS — G40909 Epilepsy, unspecified, not intractable, without status epilepticus: Secondary | ICD-10-CM | POA: Diagnosis present

## 2020-04-25 DIAGNOSIS — Z79899 Other long term (current) drug therapy: Secondary | ICD-10-CM

## 2020-04-25 DIAGNOSIS — Z888 Allergy status to other drugs, medicaments and biological substances status: Secondary | ICD-10-CM

## 2020-04-25 DIAGNOSIS — F05 Delirium due to known physiological condition: Secondary | ICD-10-CM | POA: Diagnosis present

## 2020-04-25 DIAGNOSIS — F0390 Unspecified dementia without behavioral disturbance: Secondary | ICD-10-CM | POA: Diagnosis present

## 2020-04-25 DIAGNOSIS — I10 Essential (primary) hypertension: Secondary | ICD-10-CM | POA: Diagnosis present

## 2020-04-25 DIAGNOSIS — T40691A Poisoning by other narcotics, accidental (unintentional), initial encounter: Secondary | ICD-10-CM | POA: Diagnosis not present

## 2020-04-25 DIAGNOSIS — Z20822 Contact with and (suspected) exposure to covid-19: Secondary | ICD-10-CM | POA: Diagnosis present

## 2020-04-25 DIAGNOSIS — Z96641 Presence of right artificial hip joint: Secondary | ICD-10-CM | POA: Diagnosis present

## 2020-04-25 DIAGNOSIS — Z7982 Long term (current) use of aspirin: Secondary | ICD-10-CM

## 2020-04-25 DIAGNOSIS — T40601A Poisoning by unspecified narcotics, accidental (unintentional), initial encounter: Secondary | ICD-10-CM | POA: Diagnosis present

## 2020-04-25 DIAGNOSIS — Z87891 Personal history of nicotine dependence: Secondary | ICD-10-CM

## 2020-04-25 DIAGNOSIS — R443 Hallucinations, unspecified: Secondary | ICD-10-CM | POA: Diagnosis present

## 2020-04-25 LAB — CBC WITH DIFFERENTIAL/PLATELET
Abs Immature Granulocytes: 0.01 10*3/uL (ref 0.00–0.07)
Basophils Absolute: 0.1 10*3/uL (ref 0.0–0.1)
Basophils Relative: 1 %
Eosinophils Absolute: 0.1 10*3/uL (ref 0.0–0.5)
Eosinophils Relative: 1 %
HCT: 47 % — ABNORMAL HIGH (ref 36.0–46.0)
Hemoglobin: 15.8 g/dL — ABNORMAL HIGH (ref 12.0–15.0)
Immature Granulocytes: 0 %
Lymphocytes Relative: 31 %
Lymphs Abs: 2.9 10*3/uL (ref 0.7–4.0)
MCH: 32.3 pg (ref 26.0–34.0)
MCHC: 33.6 g/dL (ref 30.0–36.0)
MCV: 96.1 fL (ref 80.0–100.0)
Monocytes Absolute: 0.8 10*3/uL (ref 0.1–1.0)
Monocytes Relative: 9 %
Neutro Abs: 5.5 10*3/uL (ref 1.7–7.7)
Neutrophils Relative %: 58 %
Platelets: 347 10*3/uL (ref 150–400)
RBC: 4.89 MIL/uL (ref 3.87–5.11)
RDW: 12.5 % (ref 11.5–15.5)
WBC: 9.4 10*3/uL (ref 4.0–10.5)
nRBC: 0 % (ref 0.0–0.2)

## 2020-04-25 LAB — URINALYSIS, ROUTINE W REFLEX MICROSCOPIC
Bacteria, UA: NONE SEEN
Bilirubin Urine: NEGATIVE
Glucose, UA: NEGATIVE mg/dL
Hgb urine dipstick: NEGATIVE
Ketones, ur: 5 mg/dL — AB
Nitrite: NEGATIVE
Protein, ur: NEGATIVE mg/dL
Specific Gravity, Urine: 1.011 (ref 1.005–1.030)
pH: 6 (ref 5.0–8.0)

## 2020-04-25 LAB — COMPREHENSIVE METABOLIC PANEL
ALT: 20 U/L (ref 0–44)
AST: 30 U/L (ref 15–41)
Albumin: 4.5 g/dL (ref 3.5–5.0)
Alkaline Phosphatase: 68 U/L (ref 38–126)
Anion gap: 11 (ref 5–15)
BUN: 9 mg/dL (ref 8–23)
CO2: 30 mmol/L (ref 22–32)
Calcium: 9.3 mg/dL (ref 8.9–10.3)
Chloride: 99 mmol/L (ref 98–111)
Creatinine, Ser: 0.79 mg/dL (ref 0.44–1.00)
GFR, Estimated: >60 mL/min (ref >60)
Glucose, Bld: 106 mg/dL — ABNORMAL HIGH (ref 70–99)
Potassium: 3.5 mmol/L (ref 3.5–5.1)
Sodium: 140 mmol/L (ref 135–145)
Total Bilirubin: 0.7 mg/dL (ref 0.3–1.2)
Total Protein: 7.4 g/dL (ref 6.5–8.1)

## 2020-04-25 LAB — AMMONIA: Ammonia: 11 umol/L (ref 9–35)

## 2020-04-25 LAB — ETHANOL: Alcohol, Ethyl (B): <10 mg/dL (ref <10)

## 2020-04-25 LAB — TROPONIN I (HIGH SENSITIVITY)
Troponin I (High Sensitivity): 6 ng/L (ref <18)
Troponin I (High Sensitivity): 5 ng/L (ref <18)

## 2020-04-25 LAB — RAPID URINE DRUG SCREEN, HOSP PERFORMED
Amphetamines: NOT DETECTED
Barbiturates: POSITIVE — AB
Benzodiazepines: NOT DETECTED
Cocaine: NOT DETECTED
Opiates: NOT DETECTED
Tetrahydrocannabinol: NOT DETECTED

## 2020-04-25 LAB — LIPASE, BLOOD: Lipase: 21 U/L (ref 11–51)

## 2020-04-25 LAB — SARS CORONAVIRUS 2 BY RT PCR (HOSPITAL ORDER, PERFORMED IN ~~LOC~~ HOSPITAL LAB): SARS Coronavirus 2: NEGATIVE

## 2020-04-25 MED ORDER — ONDANSETRON HCL 4 MG/2ML IJ SOLN: 4.0000 mg | Freq: Four times a day (QID) | INTRAMUSCULAR | Status: DC | PRN

## 2020-04-25 MED ORDER — ADULT MULTIVITAMIN W/MINERALS CH
1.0000 | ORAL_TABLET | Freq: Every day | ORAL | Status: DC
  Administered 2020-04-26 – 2020-04-27 (×2): 1 via ORAL
  Filled 2020-04-25 (×2): qty 1

## 2020-04-25 MED ORDER — CALCIUM CARBONATE ANTACID 500 MG PO CHEW: 1.0000 | CHEWABLE_TABLET | Freq: Every day | ORAL | Status: DC | PRN

## 2020-04-25 MED ORDER — BUSPIRONE HCL 5 MG PO TABS
10.0000 mg | ORAL_TABLET | Freq: Two times a day (BID) | ORAL | Status: DC
  Administered 2020-04-25 – 2020-04-27 (×4): 10 mg via ORAL
  Filled 2020-04-25: qty 1
  Filled 2020-04-25: qty 2
  Filled 2020-04-25: qty 1
  Filled 2020-04-25: qty 2

## 2020-04-25 MED ORDER — ASPIRIN EC 81 MG PO TBEC
81.0000 mg | DELAYED_RELEASE_TABLET | Freq: Every day | ORAL | Status: DC
  Administered 2020-04-26 – 2020-04-27 (×2): 81 mg via ORAL
  Filled 2020-04-25 (×2): qty 1

## 2020-04-25 MED ORDER — ATORVASTATIN CALCIUM 20 MG PO TABS
20.0000 mg | ORAL_TABLET | Freq: Every day | ORAL | Status: DC
  Administered 2020-04-26 – 2020-04-27 (×2): 20 mg via ORAL
  Filled 2020-04-25: qty 1
  Filled 2020-04-25: qty 2

## 2020-04-25 MED ORDER — SPIRONOLACTONE 25 MG PO TABS
25.0000 mg | ORAL_TABLET | Freq: Every day | ORAL | Status: DC
  Administered 2020-04-27: 25 mg via ORAL
  Filled 2020-04-25 (×2): qty 1

## 2020-04-25 MED ORDER — ACETAMINOPHEN 325 MG PO TABS: 650.0000 mg | ORAL_TABLET | Freq: Four times a day (QID) | ORAL | Status: DC | PRN

## 2020-04-25 MED ORDER — FOLIC ACID 1 MG PO TABS
1.0000 mg | ORAL_TABLET | Freq: Every day | ORAL | Status: DC
  Administered 2020-04-26 – 2020-04-27 (×2): 1 mg via ORAL
  Filled 2020-04-25 (×2): qty 1

## 2020-04-25 MED ORDER — TIZANIDINE HCL 4 MG PO TABS
2.0000 mg | ORAL_TABLET | Freq: Every day | ORAL | Status: DC
  Administered 2020-04-25 – 2020-04-26 (×2): 2 mg via ORAL
  Filled 2020-04-25 (×2): qty 1

## 2020-04-25 MED ORDER — QUETIAPINE FUMARATE 100 MG PO TABS
200.0000 mg | ORAL_TABLET | Freq: Every day | ORAL | Status: DC
  Administered 2020-04-25: 200 mg via ORAL
  Filled 2020-04-25: qty 2

## 2020-04-25 MED ORDER — IPRATROPIUM-ALBUTEROL 20-100 MCG/ACT IN AERS
1.0000 | INHALATION_SPRAY | Freq: Four times a day (QID) | RESPIRATORY_TRACT | Status: DC
  Administered 2020-04-25 – 2020-04-26 (×2): 1 via RESPIRATORY_TRACT
  Filled 2020-04-25 (×2): qty 4

## 2020-04-25 MED ORDER — ENOXAPARIN SODIUM 40 MG/0.4ML ~~LOC~~ SOLN
40.0000 mg | SUBCUTANEOUS | Status: DC
  Administered 2020-04-26 – 2020-04-27 (×2): 40 mg via SUBCUTANEOUS
  Filled 2020-04-25 (×2): qty 0.4

## 2020-04-25 MED ORDER — AMLODIPINE BESYLATE 5 MG PO TABS
2.5000 mg | ORAL_TABLET | Freq: Every day | ORAL | Status: DC
  Administered 2020-04-26 – 2020-04-27 (×2): 2.5 mg via ORAL
  Filled 2020-04-25 (×2): qty 1

## 2020-04-25 MED ORDER — HYDRALAZINE HCL 20 MG/ML IJ SOLN: 10.0000 mg | INTRAMUSCULAR | Status: DC | PRN

## 2020-04-25 MED ORDER — LABETALOL HCL 5 MG/ML IV SOLN: 10.0000 mg | INTRAVENOUS | Status: DC | PRN

## 2020-04-25 MED ORDER — IBUPROFEN 800 MG PO TABS: 800.0000 mg | ORAL_TABLET | Freq: Three times a day (TID) | ORAL | Status: DC | PRN

## 2020-04-25 MED ORDER — METOPROLOL TARTRATE 50 MG PO TABS
50.0000 mg | ORAL_TABLET | Freq: Two times a day (BID) | ORAL | Status: DC
Start: 2020-04-25 — End: 2020-04-27
  Administered 2020-04-25 – 2020-04-27 (×4): 50 mg via ORAL
  Filled 2020-04-25 (×2): qty 2
  Filled 2020-04-25 (×2): qty 1

## 2020-04-25 MED ORDER — ONDANSETRON HCL 4 MG PO TABS: 4.0000 mg | ORAL_TABLET | Freq: Four times a day (QID) | ORAL | Status: DC | PRN

## 2020-04-25 MED ORDER — THIAMINE HCL 100 MG PO TABS
100.0000 mg | ORAL_TABLET | Freq: Every day | ORAL | Status: DC
  Administered 2020-04-26 – 2020-04-27 (×2): 100 mg via ORAL
  Filled 2020-04-25 (×2): qty 1

## 2020-04-25 MED ORDER — ALBUTEROL SULFATE HFA 108 (90 BASE) MCG/ACT IN AERS
2.0000 | INHALATION_SPRAY | RESPIRATORY_TRACT | Status: DC | PRN
  Filled 2020-04-25: qty 6.7

## 2020-04-25 MED ORDER — ACETAMINOPHEN 650 MG RE SUPP: 650.0000 mg | Freq: Four times a day (QID) | RECTAL | Status: DC | PRN

## 2020-04-25 MED ORDER — FERROUS SULFATE 325 (65 FE) MG PO TABS
325.0000 mg | ORAL_TABLET | Freq: Two times a day (BID) | ORAL | Status: DC
Start: 2020-04-26 — End: 2020-04-27
  Administered 2020-04-26 – 2020-04-27 (×2): 325 mg via ORAL
  Filled 2020-04-25 (×2): qty 1

## 2020-04-25 MED ORDER — OLANZAPINE 5 MG PO TABS
2.5000 mg | ORAL_TABLET | Freq: Every day | ORAL | Status: DC
  Administered 2020-04-25: 2.5 mg via ORAL
  Filled 2020-04-25: qty 1

## 2020-04-25 NOTE — ED Notes (Signed)
Pt ambulated to the bathroom without assistance. Upon ambulating back to the room pt became very [facility] and was almost grunting. Pts SpO2 was 85% when returning to the room and pt began coughing. Pt placed on 2L O2. PA made aware.

## 2020-04-25 NOTE — H&P (Signed)
History and Physical    Kim Blankenship [id number] DOB: [birth year] DOA: 04/25/2020  PCP: Jolinda Croak, MD  Patient coming from: Home  I have personally briefly reviewed patient's old medical records in Kim Blankenship  Chief Complaint: AMS  HPI: Kim Blankenship is a 75 y.o. female with medical history significant of COPD, seizure disorder, PAF not on anticoagulants, stroke, HTN.  Pt presents to ED due to AMS.  Pt has h/o dementia.  Daughter, who lives with pt and manages her meds, noted that pt acutely confused this AM at 9AM.  Daughter found pt walking outside and pt unable to tell daughter what she was doing.  Pt improved and pt back to baseline ~1.5h later.  Then at 2pm pt having some SOB and labored breathing.  This is now resolved.  Daughter found open bottle of oxycodone on vanity with 6-8 pills left.  Pt not supposed to be taking narcotics chronically.  Pt has been complaining of neck pain and R groin pain over past few days.  Most concerning, pt has 5 prior episodes in the past 1 year of ED visits (1 was also admission) for confirmed or suspected opiod overdoses (today will be #6)!  Pt has no complaints at this time, is back to baseline mental status, is oriented x3.  Pt is in denial about taking any opiates, now or ODs in recent past.   ED Course: UA with large LE, but no bacteria, neg nitrites, and only 6-10 WBC.  BP running on high side today in ED 200/81 initially. Pt does have h/o seizures with PRES in past.  By the time im seeing patient however BP 166/86.   Review of Systems: As per HPI, otherwise all review of systems negative.  Past Medical History:  Diagnosis Date  . Asthma   . Atrial fibrillation ([place])   . Cancer (Kim Blankenship)    cervical  . COPD (chronic obstructive pulmonary disease) (Kim Blankenship)   . Hypertension   . Irregular heart rate   . PRES (posterior reversible encephalopathy syndrome)   . Stroke [facility] Blankenship)     Past Surgical History:  Procedure  Laterality Date  . CESAREAN SECTION    . Left Foot Reconstruction    . OPEN REDUCTION INTERNAL FIXATION (ORIF) METACARPAL Right 03/10/2015   Procedure: OPEN REDUCTION INTERNAL FIXATION (ORIF) RIGHT LONG  METACARPAL FRACTURE;  Surgeon: Leanora Cover, MD;  Location: [facility];  Service: Orthopedics;  Laterality: Right;  . TONSILLECTOMY    . TOTAL HIP ARTHROPLASTY Right 02/09/2016   Procedure: TOTAL HIP ARTHROPLASTY ANTERIOR APPROACH;  Surgeon: Frederik Pear, MD;  [facility];  Service: Orthopedics;  Laterality: Right;     reports that she quit smoking about 25 years ago. Her smoking use included cigarettes. She has a 60.00 pack-year smoking history. She has never used smokeless tobacco. She reports current alcohol use. She reports that she does not use drugs.  Allergies  Allergen Reactions  . Levofloxacin     Other reaction(s): Other Leg pain  . Tape Other (See Comments)    THE PATIENT'S SKIN IS THIN AND TEARS VERY EASILY (she "picks" at it; please use coban wrap)    Family History  Problem Relation Age of Onset  . Dementia Father      Prior to Admission medications   Medication Sig Start Date End Date Taking? Authorizing Provider  albuterol (VENTOLIN HFA) 108 (90 Base) MCG/ACT inhaler Inhale 2 puffs into the lungs every 4 (four) hours as needed for  wheezing or shortness of breath.   Yes [provider]  amLODipine (NORVASC) 2.5 MG tablet Take 2.5 mg by mouth daily.   Yes [provider]  aspirin EC 81 MG tablet Take 81 mg by mouth daily.   Yes [provider]  atorvastatin (LIPITOR) 20 MG tablet Take 20 mg by mouth daily. 04/08/16  Yes [provider]  busPIRone (BUSPAR) 10 MG tablet Take 10 mg by mouth 2 (two) times daily. 01/29/20  Yes [provider]  calcium carbonate (TUMS - DOSED IN MG ELEMENTAL CALCIUM) 500 MG chewable tablet Chew 1 tablet by mouth daily as needed for indigestion or heartburn.   Yes [provider]  ferrous sulfate 325 (65 FE) MG tablet Take 1 tablet (325 mg total) by mouth 2 (two) times daily with a meal. 12/10/18  Yes Hongalgi, Lenis Dickinson, MD  folic acid (FOLVITE) 1 MG tablet Take 1 tablet (1 mg total) by mouth daily. 12/11/18  Yes Hongalgi, Lenis Dickinson, MD  ibuprofen (ADVIL) 800 MG tablet Take 800 mg by mouth every 8 (eight) hours as needed for mild pain. 09/10/19  Yes [provider]  Ipratropium-Albuterol (COMBIVENT RESPIMAT) 20-100 MCG/ACT AERS respimat Inhale 1 puff into the lungs every 6 (six) hours.   Yes [provider]  metoprolol tartrate (LOPRESSOR) 50 MG tablet Take 50 mg by mouth 2 (two) times daily. 01/29/20  Yes [provider]  Multiple Vitamin (MULTIVITAMIN WITH MINERALS) TABS tablet Take 1 tablet by mouth daily. 12/11/18  Yes Hongalgi, Lenis Dickinson, MD  OLANZapine (ZYPREXA) 2.5 MG tablet Take 2.5 mg by mouth at bedtime.   Yes [provider]  QUEtiapine (SEROQUEL) 100 MG tablet Take 200 mg by mouth at bedtime.    Yes [provider]  spironolactone (ALDACTONE) 25 MG tablet Take 25 mg by mouth daily.   Yes [provider]  thiamine 100 MG tablet Take 1 tablet (100 mg total) by mouth daily. 12/11/18  Yes Hongalgi, Lenis Dickinson, MD  tiZANidine (ZANAFLEX) 2 MG tablet Take 2 mg by mouth at bedtime.   Yes [provider]    Physical Exam: Vitals:   04/25/20 1800 04/25/20 1900 04/25/20 1915 04/25/20 2036  BP: (!) 184/90 (!) 165/88 (!) 184/87 (!) 166/86  Pulse: 74 72 80 80  Resp: 12 18 20 20   Temp:    98.1 F (36.7 C)  TempSrc:    Oral  SpO2: 98% 90% 96% 98%    Constitutional: NAD, calm, comfortable Eyes: PERRL, lids and conjunctivae normal ENMT: Mucous membranes are moist. Posterior pharynx clear of any exudate or lesions.Normal dentition.  Neck: normal, supple, no masses, no thyromegaly Respiratory: clear to auscultation bilaterally, no wheezing, no crackles. Normal respiratory effort. No accessory muscle use.   Cardiovascular: Regular rate and rhythm, no murmurs / rubs / gallops. No extremity edema. 2+ pedal pulses. No carotid bruits.  Abdomen: no tenderness, no masses palpated. No hepatosplenomegaly. Bowel sounds positive.  Musculoskeletal: no clubbing / cyanosis. No joint deformity upper and lower extremities. Good ROM, no contractures. Normal muscle tone.  Skin: no rashes, lesions, ulcers. No induration Neurologic: CN 2-12 grossly intact. Sensation intact, DTR normal. Strength 5/5 in all 4.  Psychiatric: Alert and oriented x 3. In denial about opiate issues   Labs on Admission: I have personally reviewed following labs and imaging studies  CBC: Recent Labs  Lab 04/25/20 1450  WBC 9.4  NEUTROABS 5.5  HGB 15.8*  HCT 47.0*  MCV 96.1  PLT 347  Basic Metabolic Panel: Recent Labs  Lab 04/25/20 1450  NA 140  K 3.5  CL 99  CO2 30  GLUCOSE 106*  BUN 9  CREATININE 0.79  CALCIUM 9.3   GFR: CrCl cannot be calculated (Unknown ideal weight.). Liver Function Tests: Recent Labs  Lab 04/25/20 1450  AST 30  ALT 20  ALKPHOS 68  BILITOT 0.7  PROT 7.4  ALBUMIN 4.5   Recent Labs  Lab 04/25/20 1450  LIPASE 21   No results for input(s): AMMONIA in the last 168 hours. Coagulation Profile: No results for input(s): INR, PROTIME in the last 168 hours. Cardiac Enzymes: No results for input(s): CKTOTAL, CKMB, CKMBINDEX, TROPONINI in the last 168 hours. BNP (last 3 results) No results for input(s): PROBNP in the last 8760 hours. HbA1C: No results for input(s): HGBA1C in the last 72 hours. CBG: No results for input(s): GLUCAP in the last 168 hours. Lipid Profile: No results for input(s): CHOL, HDL, LDLCALC, TRIG, CHOLHDL, LDLDIRECT in the last 72 hours. Thyroid Function Tests: No results for input(s): TSH, T4TOTAL, FREET4, T3FREE, THYROIDAB in the last 72 hours. Anemia Panel: No results for input(s): VITAMINB12, FOLATE, FERRITIN, TIBC, IRON, RETICCTPCT in the last 72 hours. Urine  analysis:    Component Value Date/Time   COLORURINE YELLOW 04/25/2020 [place] 04/25/2020 1536   LABSPEC 1.011 04/25/2020 1536   PHURINE 6.0 04/25/2020 1536   GLUCOSEU NEGATIVE 04/25/2020 1536   HGBUR NEGATIVE 04/25/2020 1536   BILIRUBINUR NEGATIVE 04/25/2020 1536   KETONESUR 5 (A) 04/25/2020 1536   PROTEINUR NEGATIVE 04/25/2020 1536   NITRITE NEGATIVE 04/25/2020 1536   LEUKOCYTESUR LARGE (A) 04/25/2020 1536    Radiological Exams on Admission: CT Head Wo Contrast  Result Date: 04/25/2020 CLINICAL DATA:  History of dementia of patient presenting with episode of confusion dizziness x3 days EXAM: CT HEAD WITHOUT CONTRAST TECHNIQUE: Contiguous axial images were obtained from the base of the skull through the vertex without intravenous contrast. COMPARISON:  Head CT July 22, 2019 and brain MR October 30, 2016 FINDINGS: Brain: No evidence of acute infarction, hemorrhage, hydrocephalus, extra-axial collection or mass lesions/effect. Again seen is mild patchy areas of subcortical and periventricular white matter hypoattenuation, nonspecific but most consistent with chronic microvascular ischemic change. Vascular: No hyperdense vessel or unexpected calcification. Skull: Normal. Negative for fracture or focal lesion. Sinuses/Orbits: No acute finding. Other: None. IMPRESSION: 1. No acute intracranial findings. 2. Mild sequela of chronic microvascular white matter disease, similar to prior. Electronically Signed   By: Dahlia Bailiff MD   On: 04/25/2020 15:22   DG Chest Portable 1 View  Result Date: 04/25/2020 CLINICAL DATA:  75 year old female with altered mental status. EXAM: PORTABLE CHEST 1 VIEW COMPARISON:  Chest radiograph dated 07/22/2019 and CT dated 08/07/2019. FINDINGS: Background of emphysema. No focal consolidation, pleural effusion or pneumothorax. The cardiac silhouette is within limits. Atherosclerotic calcification of the aorta. No acute osseous pathology. IMPRESSION: No  active disease. Electronically Signed   By: Anner Crete M.D.   On: 04/25/2020 15:21    EKG: Independently reviewed.  Assessment/Plan Principal Problem:   Delirium Active Problems:   COPD (chronic obstructive pulmonary disease) (HCC)   Essential hypertension   Overdose opiate, accidental or unintentional, initial encounter ([place])   Acute metabolic encephalopathy    1. Delirium - 1. Now resolved 2. Most likely suspect pt had opiate OD earlier today causing her AMS given the extensive h/o of the same over the past 1 year including multiple confirmed  opiate ODs requiring narcan rescue. 3. Alternatively could have had encephalopathy due to PRES as she does have a h/o of this.  Doesn't sound like a tonic-clonic seizure this time though (unlike PRES a couple of years ago). 4. Will obs pt 5. And control BP 6. And avoid opiates (which she really needs to not be taking at all at this point). 7. Checking a few additional labs: 1. Ammonia 2. TSH 3. UDS 4. EtOH 8. Tele monitor 2. HTN - 1. Cont home BP meds 2. Add PRN hydralazine if needed 3. COPD - cont home nebs  DVT prophylaxis: Lovenox Code Status: Full Family Communication: No family in room Disposition Plan: Home after overnight obs if mental status remains stable Consults called: None Admission status: Place in 110    Nyajah Hyson, [facility]  How to contact the [facility] Attending or Consulting provider [place] or covering provider during after hours [place], for this patient?  1. Check the care team in [facility] and look for a) attending/consulting [facility] provider listed and b) the [facility] team listed 2. Log into [email or web address]  Amion Physician Scheduling and messaging for groups and whole hospitals  On call and physician scheduling software for group practices, residents, hospitalists and other medical providers for call, clinic, rotation and shift schedules. OnCall [employer] is a hospital-wide system for scheduling  doctors and paging doctors on call. EasyPlot is for scientific plotting and data analysis.  [email or web address]  and use [place]'s universal password to access. If you do not have the password, please contact the hospital operator.  3. Locate the [facility] provider you are looking for under [facility] and page to a number that you can be directly reached. 4. If you still have difficulty reaching the provider, please page the [facility] (Director on Call) for the Hospitalists listed on amion for assistance.  04/25/2020, 10:13 PM

## 2020-04-25 NOTE — ED Provider Notes (Signed)
[facility] DEPT Provider Note   CSN: [id number] Arrival date & time: 04/25/20  1418     History Chief Complaint  Patient presents with  . Altered Mental Status    Gerrie Castiglia is a 75 y.o. female with a past medical history significant for COPD, seizure disorder, paroxysmal A. Fib not on chronic anticoagulants per chart review, HX CVA, depression, diastolic heart failure, hypertension, and hyperlipidemia who presents to the ED due to altered mental status.  Patient has a history of dementia. Spoke to daughter, [name] who patient lives who notes that patient was acutely confused this morning at [place] found patient walking outside and she was unable to tell her what she was doing. Lauren states that her confusion improved and patient was back to baseline roughly 1.5 hours after; however, at [facility] patient began having shortness of breath with labored breathing associated with wheezing. Lauren found an open bottle of oxycodone on the vanity with 6-8 pills left. Lauren believes patient could have taken roughly 5-6 pills. Daughter states that patient has been complaining of neck pain and right groin pain over the past few days. She has a history of a vulvectomy in July complicated by infection. Daughter notes that patient is rarely confused due to her dementia, but mostly experiences sundowning in the afternoons. No speech changes or obvious unilateral weakness. No treatment prior to arrival. No aggravating or alleviating factors.  Level 5 caveat secondary to history of dementia.  History obtained from patient, daughter, and past medical records. No interpreter used during encounter.      Past Medical History:  Diagnosis Date  . Asthma   . Atrial fibrillation ([place])   . Cancer ([place])    cervical  . COPD (chronic obstructive pulmonary disease) ([place])   . Hypertension   . Irregular heart rate   . PRES (posterior reversible encephalopathy syndrome)   . Stroke  [facility])     Patient Active Problem List   Diagnosis Date Noted  . Acute respiratory failure with hypoxia ([place]) 07/23/2019  . Acute encephalopathy 07/22/2019  . Overdose opiate, accidental or unintentional, initial encounter ([place]) 07/22/2019  . Vulvar cancer ([place]) 07/22/2019  . Delirium   . GIB (gastrointestinal bleeding) 12/04/2018  . Chronic diastolic CHF (congestive heart failure) ([place]) 12/04/2018  . 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 Stroke (cerebrum) ([place]) 11/21/2016  . Hyponatremia 11/20/2016  . UTI (urinary tract infection) 11/20/2016  . HLD (hyperlipidemia) 11/20/2016  . GERD (gastroesophageal reflux disease) 11/20/2016  . Depression 11/20/2016  . Hypertensive emergency 11/04/2016  . EtOH dependence ([place]) 11/04/2016  . Hyperglycemia 11/04/2016  . Essential hypertension   . Hypertensive urgency 10/30/2016  . PAF (paroxysmal atrial fibrillation) ([place]) 10/30/2016  . PRES (posterior reversible encephalopathy syndrome) 10/30/2016  . Seizure ([place])   . Acute blood loss anemia   . Closed fracture of right hip ([place]) 02/09/2016  . Chest pain 11/07/2015  . COPD (chronic obstructive pulmonary disease) ([place]) 08/19/2013  . Abnormal EKG 08/19/2013  . Fever, unspecified 08/19/2013    Past Surgical History:  Procedure Laterality Date  . CESAREAN SECTION    . Left Foot Reconstruction    . OPEN REDUCTION INTERNAL FIXATION (ORIF) METACARPAL Right 03/10/2015   Procedure: OPEN REDUCTION INTERNAL FIXATION (ORIF) RIGHT LONG  METACARPAL FRACTURE;  Surgeon: Leanora Cover, MD;  Location: [facility];  Service: Orthopedics;  Laterality: Right;  . TONSILLECTOMY    . TOTAL HIP ARTHROPLASTY Right 02/09/2016  Procedure: TOTAL HIP ARTHROPLASTY ANTERIOR APPROACH;  Surgeon: Frederik Pear, MD;  Location: [facility];  Service: Orthopedics;  Laterality: Right;     OB History    Gravida      Para      Term      Preterm      AB      Living  1      SAB      IAB      Ectopic      Multiple      Live Births              Family History  Problem Relation Age of Onset  . Dementia Father     Social History   Tobacco Use  . Smoking status: Former Smoker    Packs/day: 2.00    Years: 30.00    Pack years: 60.00    Types: Cigarettes    Quit date: 04/1995    Years since quitting: 25.0  . Smokeless tobacco: Never Used  Vaping Use  . Vaping Use: Never used  Substance Use Topics  . Alcohol use: Yes    Comment: wine daily  . Drug use: No    Home Medications Prior to Admission medications   Medication Sig Start Date End Date Taking? Authorizing Provider  albuterol (VENTOLIN HFA) 108 (90 Base) MCG/ACT inhaler Inhale 2 puffs into the lungs every 4 (four) hours as needed for wheezing or shortness of breath.   Yes [provider]  amLODipine (NORVASC) 2.5 MG tablet Take 2.5 mg by mouth daily.   Yes [provider]  aspirin EC 81 MG tablet Take 81 mg by mouth daily.   Yes [provider]  atorvastatin (LIPITOR) 20 MG tablet Take 20 mg by mouth daily. 04/08/16  Yes [provider]  busPIRone (BUSPAR) 10 MG tablet Take 10 mg by mouth 2 (two) times daily. 01/29/20  Yes [provider]  calcium carbonate (TUMS - DOSED IN MG ELEMENTAL CALCIUM) 500 MG chewable tablet Chew 1 tablet by mouth daily as needed for indigestion or heartburn.   Yes [provider]  ferrous sulfate 325 (65 FE) MG tablet Take 1 tablet (325 mg total) by mouth 2 (two) times daily with a meal. 12/10/18  Yes Hongalgi, Lenis Dickinson, MD  folic acid (FOLVITE) 1 MG tablet Take 1 tablet (1 mg total) by mouth daily. 12/11/18  Yes Hongalgi, Lenis Dickinson, MD  ibuprofen (ADVIL) 800 MG tablet Take 800 mg by mouth every 8 (eight) hours as needed for mild pain. 09/10/19  Yes [provider]  Ipratropium-Albuterol (COMBIVENT RESPIMAT) 20-100 MCG/ACT AERS respimat Inhale 1 puff into the lungs every 6 (six) hours.   Yes [provider]  metoprolol tartrate (LOPRESSOR) 50 MG tablet Take 50 mg by mouth 2 (two) times daily. 01/29/20  Yes [provider]  Multiple Vitamin (MULTIVITAMIN WITH MINERALS) TABS tablet Take 1 tablet by mouth daily. 12/11/18  Yes Hongalgi, Lenis Dickinson, MD  OLANZapine (ZYPREXA) 2.5 MG tablet Take 2.5 mg by mouth at bedtime.   Yes [provider]  QUEtiapine (SEROQUEL) 100 MG tablet Take 200 mg by mouth at bedtime.    Yes [provider]  spironolactone (ALDACTONE) 25 MG tablet Take 25 mg by mouth daily.   Yes [provider]  thiamine 100 MG tablet Take 1 tablet (100 mg total) by mouth daily. 12/11/18  Yes Hongalgi, Lenis Dickinson, MD  tiZANidine (ZANAFLEX) 2 MG tablet Take 2 mg by mouth  at bedtime.   Yes [provider]    Allergies    Levofloxacin and Tape  Review of Systems   Review of Systems  Unable to perform ROS: Dementia    Physical Exam Updated Vital Signs BP (!) 166/86 (BP Location: Right Arm)   Pulse 80   Temp 98.1 F (36.7 C) (Oral)   Resp 20   SpO2 98%   Physical Exam Vitals and nursing note reviewed.  Constitutional:      General: She is not in acute distress.    Appearance: She is not ill-appearing.  HENT:     Head: Normocephalic.  Eyes:     Pupils: Pupils are equal, round, and reactive to light.  Cardiovascular:     Rate and Rhythm: Normal rate and regular rhythm.     Pulses: Normal pulses.     Heart sounds: Normal heart sounds. No murmur heard. No friction rub. No gallop.   Pulmonary:     Effort: Pulmonary effort is normal.     Breath sounds: Normal breath sounds.  Abdominal:     General: Abdomen is flat. There is no distension.     Palpations: Abdomen is soft.     Tenderness: There is no abdominal tenderness. There is no guarding or rebound.  Musculoskeletal:        General: Normal range of motion.     Cervical back: Neck supple.  Skin:    General: Skin is warm and dry.  Neurological:     Mental Status: She is  alert.     Comments: Patient able to tell me full name, where she is, and year. Speech is clear, able to follow commands CN III-XII intact Normal strength in upper and lower extremities bilaterally including dorsiflexion and plantar flexion, strong and equal grip strength Sensation grossly intact throughout Moves extremities without ataxia, coordination intact No pronator drift   Psychiatric:        Mood and Affect: Mood normal.        Behavior: Behavior normal.     ED Results / Procedures / Treatments   Labs (all labs ordered are listed, but only abnormal results are displayed) Labs Reviewed  CBC WITH DIFFERENTIAL/PLATELET - Abnormal; Notable for the following components:      Result Value   Hemoglobin 15.8 (*)    HCT 47.0 (*)    All other components within normal limits  COMPREHENSIVE METABOLIC PANEL - Abnormal; Notable for the following components:   Glucose, Bld 106 (*)    All other components within normal limits  URINALYSIS, ROUTINE W REFLEX MICROSCOPIC - Abnormal; Notable for the following components:   Ketones, ur 5 (*)    Leukocytes,Ua LARGE (*)    All other components within normal limits  SARS CORONAVIRUS 2 BY RT PCR (HOSPITAL ORDER, [facility])  URINE CULTURE  LIPASE, BLOOD  TROPONIN I (HIGH SENSITIVITY)  TROPONIN I (HIGH SENSITIVITY)    EKG EKG Interpretation  Date/Time:  Tuesday April 25 2020 19:19:23 EST Ventricular Rate:  84 PR Interval:    QRS Duration: 108 QT Interval:  383 QTC Calculation: 453 R Axis:   83 Text Interpretation: Sinus rhythm Borderline right axis deviation No STEMI Confirmed by Nanda Quinton [phone]) on 04/25/2020 7:28:01 PM   Radiology CT Head Wo Contrast  Result Date: 04/25/2020 CLINICAL DATA:  History of dementia of patient presenting with episode of confusion dizziness x3 days EXAM: CT HEAD WITHOUT CONTRAST TECHNIQUE: Contiguous axial images were obtained from the  base of the skull through the vertex  without intravenous contrast. COMPARISON:  Head CT July 22, 2019 and brain MR October 30, 2016 FINDINGS: Brain: No evidence of acute infarction, hemorrhage, hydrocephalus, extra-axial collection or mass lesions/effect. Again seen is mild patchy areas of subcortical and periventricular white matter hypoattenuation, nonspecific but most consistent with chronic microvascular ischemic change. Vascular: No hyperdense vessel or unexpected calcification. Skull: Normal. Negative for fracture or focal lesion. Sinuses/Orbits: No acute finding. Other: None. IMPRESSION: 1. No acute intracranial findings. 2. Mild sequela of chronic microvascular white matter disease, similar to prior. Electronically Signed   By: Dahlia Bailiff MD   On: 04/25/2020 15:22   DG Chest Portable 1 View  Result Date: 04/25/2020 CLINICAL DATA:  74 year old female with altered mental status. EXAM: PORTABLE CHEST 1 VIEW COMPARISON:  Chest radiograph dated 07/22/2019 and CT dated 08/07/2019. FINDINGS: Background of emphysema. No focal consolidation, pleural effusion or pneumothorax. The cardiac silhouette is within limits. Atherosclerotic calcification of the aorta. No acute osseous pathology. IMPRESSION: No active disease. Electronically Signed   By: Anner Crete M.D.   On: 04/25/2020 15:21    Procedures Procedures   Medications Ordered in ED Medications  amLODipine (NORVASC) tablet 2.5 mg (has no administration in time range)  metoprolol tartrate (LOPRESSOR) tablet 50 mg (has no administration in time range)  QUEtiapine (SEROQUEL) tablet 200 mg (has no administration in time range)  spironolactone (ALDACTONE) tablet 25 mg (has no administration in time range)  atorvastatin (LIPITOR) tablet 20 mg (has no administration in time range)  aspirin EC tablet 81 mg (has no administration in time range)  albuterol (VENTOLIN HFA) 108 (90 Base) MCG/ACT inhaler 2 puff (has no administration in time range)  ferrous sulfate tablet 325 mg (has no  administration in time range)  folic acid (FOLVITE) tablet 1 mg (has no administration in time range)  calcium carbonate (TUMS - dosed in mg elemental calcium) chewable tablet 200 mg of elemental calcium (has no administration in time range)  busPIRone (BUSPAR) tablet 10 mg (has no administration in time range)  Ipratropium-Albuterol (COMBIVENT) respimat 1 puff (has no administration in time range)  ibuprofen (ADVIL) tablet 800 mg (has no administration in time range)  thiamine tablet 100 mg (has no administration in time range)  tiZANidine (ZANAFLEX) tablet 2 mg (has no administration in time range)  OLANZapine (ZYPREXA) tablet 2.5 mg (has no administration in time range)  multivitamin with minerals tablet 1 tablet (has no administration in time range)  labetalol (NORMODYNE) injection 10-20 mg (has no administration in time range)    ED Course  I have reviewed the triage vital signs and the nursing notes.  Pertinent labs & imaging results that were available during my care of the patient were reviewed by me and considered in my medical decision making (see chart for details).  Clinical Course as of 04/25/20 2038  Tue Apr 25, 2020  1452 BP(!): 200/81 [CA]  1717 SARS Coronavirus 2: NEGATIVE [CA]    Clinical Course User Index [CA] Suzy Bouchard, PA-C   MDM Rules/Calculators/A&P                         75 year old female presents to the ED due to worsening confusion and acute shortness of breath. Patient has a history of dementia and lives his her daughter. Spoke to daughter on the phone who notes patient was found in the road earlier today at [facility] and was more confused than baseline;  however, returned to baseline 1.5 hours later. Later in the afternoon, patient began having shortness of breath. Daughter thinks that patient may have taken a few oxycodone prior to the shortness of breath. Stable vitals upon arrival. Patient in no acute distress. Maintaining O2 saturation at 95% on RA;  however, when ambulating to the bathroom, patient desaturated to 85% and placed on 2L [place]. Routine labs ordered at triage. COVID test ordered. Suspect shortness of breath related to oxycodone given small pupils. Will continue to monitor. Chart reviewed. Patient has been seen numerous times due to opioid overdose in the past. Patient has a history of COPD. No wheeze heard on exam. Lower suspicion for COPD exacerbation. No signs of DVT on exam. Low suspicion for PE. Normal neurological exam. Low suspicion for CVA. Suspect confusion earlier related to history of dementia.   CMP reassuring with mild hyperglycemia at 106 with no anion gap.  Doubt DKA.  Lipase normal at 21. COVID negative. UA large leukocytes and ketonuria.  No nitrites or bacteria.  Low suspicion for acute cystitis.  Urine culture pending.  CBC reassuring with no leukocytosis.  6:37 PM reassessed patient at bedside he notes complete resolution in symptoms.  Lungs clear to auscultation bilaterally.  No tachypnea. Discussed results with patient's daughter.  7:25 PM Patient ambulated again and dropped down to 87%. While discussing options with patient, patient's O2 saturation dropped to 89% while at rest. 2L [place] started. Lungs clear to auscultation with no wheeze. Reviewed previous chart's and patient's O2 saturation is typically in the low 90%. Suspect patient is typically intermittently hypoxic due to COPD which has worsened due to oxycodone overdose.   Given patient's continued elevated BP, troponin ordered to rule out hypertensive urgency. Will consult admission for hypertensive urgency given altered mental status. Confirmed with patient's daughter she has been given all of her BP medications. BP appears to be higher than her baseline. Has a history of PRES and hypertensive emergency. Will defer MRI of brain to hospitalist service during medical admission.   Discussed case with Dr. Alcario Drought with [facility] who agrees to admit patient to further  treatment of hypertensive urgency.  Final Clinical Impression(s) / ED Diagnoses Final diagnoses:  Hypertensive urgency    Rx / DC Orders ED Discharge Orders    None       Karie Kirks 04/25/20 2039    Carmin Muskrat, MD 04/28/20 1407

## 2020-04-25 NOTE — ED Notes (Signed)
Pt ambulated around room and SpO2 dropped to 86%. Pt helped back to bed and placed back on 2L.

## 2020-04-25 NOTE — ED Notes (Signed)
Patient ambulated next to bed for 80 seconds.  Her O2 saturation was 91% when she stood up but it went down to 90% immediately upon standing and stayed at 90% for the first 60 seconds of ambulation, after that her O2 dropped precipitously for the last 20 seconds.  Her O2 saturation was at 86% when she got back into the bed.

## 2020-04-25 NOTE — ED Triage Notes (Signed)
Pt BIB EMS from home. Pt has hx of dementia. Per daughter pt has episodes of confusion, but states that this "episode" has lasted longer than normal. Pt reports dizziness x3 days.

## 2020-04-26 DIAGNOSIS — Z20822 Contact with and (suspected) exposure to covid-19: Secondary | ICD-10-CM | POA: Diagnosis present

## 2020-04-26 DIAGNOSIS — Z7982 Long term (current) use of aspirin: Secondary | ICD-10-CM | POA: Diagnosis not present

## 2020-04-26 DIAGNOSIS — I160 Hypertensive urgency: Secondary | ICD-10-CM | POA: Diagnosis present

## 2020-04-26 DIAGNOSIS — J449 Chronic obstructive pulmonary disease, unspecified: Secondary | ICD-10-CM | POA: Diagnosis present

## 2020-04-26 DIAGNOSIS — G928 Other toxic encephalopathy: Secondary | ICD-10-CM | POA: Diagnosis present

## 2020-04-26 DIAGNOSIS — G40909 Epilepsy, unspecified, not intractable, without status epilepticus: Secondary | ICD-10-CM | POA: Diagnosis present

## 2020-04-26 DIAGNOSIS — T40691A Poisoning by other narcotics, accidental (unintentional), initial encounter: Secondary | ICD-10-CM | POA: Diagnosis present

## 2020-04-26 DIAGNOSIS — Z8673 Personal history of transient ischemic attack (TIA), and cerebral infarction without residual deficits: Secondary | ICD-10-CM | POA: Diagnosis not present

## 2020-04-26 DIAGNOSIS — Z87891 Personal history of nicotine dependence: Secondary | ICD-10-CM | POA: Diagnosis not present

## 2020-04-26 DIAGNOSIS — Y929 Unspecified place or not applicable: Secondary | ICD-10-CM | POA: Diagnosis not present

## 2020-04-26 DIAGNOSIS — R410 Disorientation, unspecified: Secondary | ICD-10-CM | POA: Diagnosis not present

## 2020-04-26 DIAGNOSIS — F05 Delirium due to known physiological condition: Secondary | ICD-10-CM | POA: Diagnosis present

## 2020-04-26 DIAGNOSIS — R443 Hallucinations, unspecified: Secondary | ICD-10-CM | POA: Diagnosis present

## 2020-04-26 DIAGNOSIS — I480 Paroxysmal atrial fibrillation: Secondary | ICD-10-CM | POA: Diagnosis present

## 2020-04-26 DIAGNOSIS — Z888 Allergy status to other drugs, medicaments and biological substances status: Secondary | ICD-10-CM | POA: Diagnosis not present

## 2020-04-26 DIAGNOSIS — I10 Essential (primary) hypertension: Secondary | ICD-10-CM | POA: Diagnosis present

## 2020-04-26 DIAGNOSIS — Z96641 Presence of right artificial hip joint: Secondary | ICD-10-CM | POA: Diagnosis present

## 2020-04-26 DIAGNOSIS — F0390 Unspecified dementia without behavioral disturbance: Secondary | ICD-10-CM | POA: Diagnosis present

## 2020-04-26 DIAGNOSIS — Z79899 Other long term (current) drug therapy: Secondary | ICD-10-CM | POA: Diagnosis not present

## 2020-04-26 LAB — TSH: TSH: 1.644 u[IU]/mL (ref 0.350–4.500)

## 2020-04-26 MED ORDER — IPRATROPIUM-ALBUTEROL 20-100 MCG/ACT IN AERS
1.0000 | INHALATION_SPRAY | Freq: Four times a day (QID) | RESPIRATORY_TRACT | Status: DC
  Administered 2020-04-27: 1 via RESPIRATORY_TRACT
  Filled 2020-04-26: qty 4

## 2020-04-26 MED ORDER — HALOPERIDOL LACTATE 5 MG/ML IJ SOLN
INTRAMUSCULAR | Status: AC
  Administered 2020-04-26: 2 mg via INTRAVENOUS
  Filled 2020-04-26: qty 1

## 2020-04-26 MED ORDER — HALOPERIDOL LACTATE 5 MG/ML IJ SOLN
2.0000 mg | Freq: Four times a day (QID) | INTRAMUSCULAR | Status: DC | PRN
  Administered 2020-04-26: 2 mg via INTRAVENOUS
  Filled 2020-04-26: qty 1

## 2020-04-26 MED ORDER — IPRATROPIUM-ALBUTEROL 20-100 MCG/ACT IN AERS
1.0000 | INHALATION_SPRAY | Freq: Four times a day (QID) | RESPIRATORY_TRACT | Status: DC
  Filled 2020-04-26: qty 4

## 2020-04-26 NOTE — ED Notes (Signed)
Initial contact with pt. Upon arrival to room pt is gone and has unhooked herself from monitor and walked to the restroom. Pt alert to person and place. Pt given room assignment and states, " there's no reason for me to go upstairs, I'm fine down here". Pt placed back on monitor including O2 and is now resting in bed.

## 2020-04-26 NOTE — Progress Notes (Signed)
PROGRESS NOTE    Kim Blankenship  JTT:[id number] DOB: 11-02-1945 DOA: 04/25/2020 PCP: Jolinda Croak, MD  Brief Narrative: HPI per Dr. Alcario Drought on 04/25/2020  Kim Blankenship is a 75 y.o. female with medical history significant of COPD, seizure disorder, PAF not on anticoagulants, stroke, HTN.  Pt presents to ED due to AMS.  Pt has h/o dementia.  Daughter, who lives with pt and manages her meds, noted that pt acutely confused this AM at 9AM.  Daughter found pt walking outside and pt unable to tell daughter what she was doing.  Pt improved and pt back to baseline ~1.5h later.  Then at 2pm pt having some SOB and labored breathing.  This is now resolved.  Daughter found open bottle of oxycodone on vanity with 6-8 pills left.  Pt not supposed to be taking narcotics chronically.  Pt has been complaining of neck pain and R groin pain over past few days.  Most concerning, pt has 5 prior episodes in the past 1 year of ED visits (1 was also admission) for confirmed or suspected opiod overdoses (today will be #6)!  Assessment & Plan:   Principal Problem:   Delirium Active Problems:   COPD (chronic obstructive pulmonary disease) (HCC)   Essential hypertension   Overdose opiate, accidental or unintentional, initial encounter ([place])   Acute metabolic encephalopathy   #1 acute metabolic encephalopathy unclear etiology.  Discussed with her daughter.  Patient herself does not have a seizure disorder.  Per daughter she had a small seizure many many years ago after she had a stroke.  However daughter herself has epilepsy. Patient probably have taken oxycodone 6 to 8 tablets she received Narcan. She has not had anything to eat or drink today.  She complains of abdominal pain and nausea. Will continue her on IV fluids for tonight. PT consult. Holding Seroquel and Zyprexa till she is back to her baseline. She has not gotten out of bed since she has come to the hospital. She received a dose of  Haldol as she was being combative and disoriented and hallucinating. Urine drug screen showed barbiturates. Ammonia level was 11.  White count was normal. UA showed ketones and leukocytes. Patient lives at home with her daughter.  #2 history of dementia with occasional bouts of psychosis on Zyprexa and Seroquel.  #3 history of essential hypertension continue home meds.  #4 history of COPD stable continue home meds.   Estimated body mass index is 21.79 kg/m as calculated from the following:   Height as of 08/06/19: 5\' 3"  (1.6 m).   Weight as of 08/06/19: 55.8 kg.  DVT prophylaxis: Lovenox Code Status: Full code Family Communication dw daughter  :Disposition Plan:  Status is: Observation  Dispo: The patient is from: Home              Anticipated d/c is to: Home              Anticipated d/c date is: 1 day              Patient currently is not medically stable to d/c.   Difficult to place patient    Consultants: none Procedures: none Antimicrobials: none  Subjective: Overnight events noted patient became agitated disoriented and was hallucinating was given Haldol.  When I saw her in the ER she was complaining of nausea and abdominal pain.  Denied any vomiting. Objective: Vitals:   04/26/20 0625 04/26/20 1000 04/26/20 1345 04/26/20 1400  BP: 122/88 (!) 87/52 (!) 154/89  135/88  Pulse: 80 65 84 81  Resp: 16 15 (!) 22 20  Temp:      TempSrc:      SpO2: 96% 93% 98% 98%   No intake or output data in the 24 hours ending 04/26/20 1521 There were no vitals filed for this visit.  Examination:  General exam: Appears calm and comfortable  Respiratory system: Clear to auscultation. Respiratory effort normal. Cardiovascular system: S1 & S2 heard, RRR. No JVD, murmurs, rubs, gallops or clicks. No pedal edema. Gastrointestinal system: Abdomen is nondistended, soft and generalized tender. No organomegaly or masses felt. Normal bowel sounds heard. Central nervous system: Alert and  oriented. No focal neurological deficits. Extremities: Symmetric 5 x 5 power. Skin: No rashes, lesions or ulcers Psychiatry: Judgement and insight impaired.   Data Reviewed: I have personally reviewed following labs and imaging studies  CBC: Recent Labs  Lab 04/25/20 1450  WBC 9.4  NEUTROABS 5.5  HGB 15.8*  HCT 47.0*  MCV 96.1  PLT [id number]   Basic Metabolic Panel: Recent Labs  Lab 04/25/20 1450  NA 140  K 3.5  CL 99  CO2 30  GLUCOSE 106*  BUN 9  CREATININE 0.79  CALCIUM 9.3   GFR: CrCl cannot be calculated (Unknown ideal weight.). Liver Function Tests: Recent Labs  Lab 04/25/20 1450  AST 30  ALT 20  ALKPHOS 68  BILITOT 0.7  PROT 7.4  ALBUMIN 4.5   Recent Labs  Lab 04/25/20 1450  LIPASE 21   Recent Labs  Lab 04/25/20 2312  AMMONIA 11   Coagulation Profile: No results for input(s): INR, PROTIME in the last 168 hours. Cardiac Enzymes: No results for input(s): CKTOTAL, CKMB, CKMBINDEX, TROPONINI in the last 168 hours. BNP (last 3 results) No results for input(s): PROBNP in the last 8760 hours. HbA1C: No results for input(s): HGBA1C in the last 72 hours. CBG: No results for input(s): GLUCAP in the last 168 hours. Lipid Profile: No results for input(s): CHOL, HDL, LDLCALC, TRIG, CHOLHDL, LDLDIRECT in the last 72 hours. Thyroid Function Tests: Recent Labs    04/25/20 2312  TSH 1.644   Anemia Panel: No results for input(s): VITAMINB12, FOLATE, FERRITIN, TIBC, IRON, RETICCTPCT in the last 72 hours. Sepsis Labs: No results for input(s): PROCALCITON, LATICACIDVEN in the last 168 hours.  Recent Results (from the past 240 hour(s))  SARS Coronavirus 2 by RT PCR (hospital order, performed in [facility] hospital lab) Nasopharyngeal Nasopharyngeal Swab     Status: None   Collection Time: 04/25/20  3:37 PM   Specimen: Nasopharyngeal Swab  Result Value Ref Range Status   SARS Coronavirus 2 NEGATIVE NEGATIVE Final    Comment: (NOTE) SARS-CoV-2 target  nucleic acids are NOT DETECTED.  The SARS-CoV-2 RNA is generally detectable in upper and lower respiratory specimens during the acute phase of infection. The lowest concentration of SARS-CoV-2 viral copies this assay can detect is 250 copies / mL. A negative result does not preclude SARS-CoV-2 infection and should not be used as the sole basis for treatment or other patient management decisions.  A negative result may occur with improper specimen collection / handling, submission of specimen other than nasopharyngeal swab, presence of viral mutation(s) within the areas targeted by this assay, and inadequate number of viral copies (<250 copies / mL). A negative result must be combined with clinical observations, patient history, and epidemiological information.  Fact Sheet for Patients:   StrictlyIdeas.no  Fact Sheet for Healthcare Providers: BankingDealers.co.za  This test is  not yet approved or  cleared by the [place] and has been authorized for detection and/or diagnosis of SARS-CoV-2 by FDA under an Emergency Use Authorization (EUA).  This EUA will remain in effect (meaning this test can be used) for the duration of the COVID-19 declaration under Section 564(b)(1) of the Act, 21 U.S.C. section 360bbb-3(b)(1), unless the authorization is terminated or revoked sooner.  Performed at [facility], [place] [address], [place], [place] [address]          Radiology Studies: CT Head Wo Contrast  Result Date: 04/25/2020 CLINICAL DATA:  History of dementia of patient presenting with episode of confusion dizziness x3 days EXAM: CT HEAD WITHOUT CONTRAST TECHNIQUE: Contiguous axial images were obtained from the base of the skull through the vertex without intravenous contrast. COMPARISON:  Head CT July 22, 2019 and brain MR October 30, 2016 FINDINGS: Brain: No evidence of acute infarction, hemorrhage, hydrocephalus,  extra-axial collection or mass lesions/effect. Again seen is mild patchy areas of subcortical and periventricular white matter hypoattenuation, nonspecific but most consistent with chronic microvascular ischemic change. Vascular: No hyperdense vessel or unexpected calcification. Skull: Normal. Negative for fracture or focal lesion. Sinuses/Orbits: No acute finding. Other: None. IMPRESSION: 1. No acute intracranial findings. 2. Mild sequela of chronic microvascular white matter disease, similar to prior. Electronically Signed   By: Dahlia Bailiff MD   On: 04/25/2020 15:22   DG Chest Portable 1 View  Result Date: 04/25/2020 CLINICAL DATA:  75 year old female with altered mental status. EXAM: PORTABLE CHEST 1 VIEW COMPARISON:  Chest radiograph dated 07/22/2019 and CT dated 08/07/2019. FINDINGS: Background of emphysema. No focal consolidation, pleural effusion or pneumothorax. The cardiac silhouette is within limits. Atherosclerotic calcification of the aorta. No acute osseous pathology. IMPRESSION: No active disease. Electronically Signed   By: Anner Crete M.D.   On: 04/25/2020 15:21        Scheduled Meds: . amLODipine  2.5 mg Oral Daily  . aspirin EC  81 mg Oral Daily  . atorvastatin  20 mg Oral Daily  . busPIRone  10 mg Oral BID  . enoxaparin (LOVENOX) injection  40 mg Subcutaneous Q24H  . ferrous sulfate  325 mg Oral BID WC  . folic acid  1 mg Oral Daily  . Ipratropium-Albuterol  1 puff Inhalation Q6H  . metoprolol tartrate  50 mg Oral BID  . multivitamin with minerals  1 tablet Oral Daily  . OLANZapine  2.5 mg Oral QHS  . QUEtiapine  200 mg Oral QHS  . spironolactone  25 mg Oral Daily  . thiamine  100 mg Oral Daily  . tiZANidine  2 mg Oral QHS   Continuous Infusions:   LOS: 0 days   Georgette Shell, MD  04/26/2020, 3:21 PM

## 2020-04-26 NOTE — ED Notes (Signed)
Patient ambulatory to restroom without assistance at this time.

## 2020-04-26 NOTE — Progress Notes (Signed)
Pt confused and agitated.  Oriented to self only.  Thinks shes "in the mall" tried to hit RNs and leave.  Giving 2mg  haldol.

## 2020-04-26 NOTE — ED Notes (Signed)
Pt given meal tray.

## 2020-04-26 NOTE — ED Notes (Addendum)
Patient got confuse this morning and she thought she is in the mall and everyone is taking her money.  Pt got agitated and physically aggressive with staff.  Haldol 2mg  Given. Will continue to monitor.

## 2020-04-27 LAB — URINE CULTURE: Culture: 10000 — AB

## 2020-04-27 LAB — COMPREHENSIVE METABOLIC PANEL
ALT: 21 U/L (ref 0–44)
AST: 31 U/L (ref 15–41)
Albumin: 4.1 g/dL (ref 3.5–5.0)
Alkaline Phosphatase: 54 U/L (ref 38–126)
Anion gap: 12 (ref 5–15)
BUN: 18 mg/dL (ref 8–23)
CO2: 26 mmol/L (ref 22–32)
Calcium: 9.4 mg/dL (ref 8.9–10.3)
Chloride: 101 mmol/L (ref 98–111)
Creatinine, Ser: 0.84 mg/dL (ref 0.44–1.00)
GFR, Estimated: >60 mL/min (ref >60)
Glucose, Bld: 94 mg/dL (ref 70–99)
Potassium: 3.8 mmol/L (ref 3.5–5.1)
Sodium: 139 mmol/L (ref 135–145)
Total Bilirubin: 1.3 mg/dL — ABNORMAL HIGH (ref 0.3–1.2)
Total Protein: 6.6 g/dL (ref 6.5–8.1)

## 2020-04-27 LAB — CBC
HCT: 41.3 % (ref 36.0–46.0)
Hemoglobin: 13.8 g/dL (ref 12.0–15.0)
MCH: 32.1 pg (ref 26.0–34.0)
MCHC: 33.4 g/dL (ref 30.0–36.0)
MCV: 96 fL (ref 80.0–100.0)
Platelets: 280 10*3/uL (ref 150–400)
RBC: 4.3 MIL/uL (ref 3.87–5.11)
RDW: 12.5 % (ref 11.5–15.5)
WBC: 6.6 10*3/uL (ref 4.0–10.5)
nRBC: 0 % (ref 0.0–0.2)

## 2020-04-27 NOTE — Discharge Instructions (Signed)
Hypertension, Adult High blood pressure (hypertension) is when the force of blood pumping through the arteries is too strong. The arteries are the blood vessels that carry blood from the heart throughout the body. Hypertension forces the heart to work harder to pump blood and may cause arteries to become narrow or stiff. Untreated or uncontrolled hypertension can cause a heart attack, heart failure, a stroke, kidney disease, and other problems. A blood pressure reading consists of a higher number over a lower number. Ideally, your blood pressure should be below 120/80. The first ("top") number is called the systolic pressure. It is a measure of the pressure in your arteries as your heart beats. The second ("bottom") number is called the diastolic pressure. It is a measure of the pressure in your arteries as the heart relaxes. What are the causes? The exact cause of this condition is not known. There are some conditions that result in or are related to high blood pressure. What increases the risk? Some risk factors for high blood pressure are under your control. The following factors may make you more likely to develop this condition:  Smoking.  Having type 2 diabetes mellitus, high cholesterol, or both.  Not getting enough exercise or physical activity.  Being overweight.  Having too much fat, sugar, calories, or salt (sodium) in your diet.  Drinking too much alcohol. Some risk factors for high blood pressure may be difficult or impossible to change. Some of these factors include:  Having chronic kidney disease.  Having a family history of high blood pressure.  Age. Risk increases with age.  Race. You may be at higher risk if you are African American.  Gender. Men are at higher risk than women before age 27. After age 17, women are at higher risk than men.  Having obstructive sleep apnea.  Stress. What are the signs or symptoms? High blood pressure may not cause symptoms. Very high  blood pressure (hypertensive crisis) may cause:  Headache.  Anxiety.  Shortness of breath.  Nosebleed.  Nausea and vomiting.  Vision changes.  Severe chest pain.  Seizures. How is this diagnosed? This condition is diagnosed by measuring your blood pressure while you are seated, with your arm resting on a flat surface, your legs uncrossed, and your feet flat on the floor. The cuff of the blood pressure monitor will be placed directly against the skin of your upper arm at the level of your heart. It should be measured at least twice using the same arm. Certain conditions can cause a difference in blood pressure between your right and left arms. Certain factors can cause blood pressure readings to be lower or higher than normal for a short period of time:  When your blood pressure is higher when you are in a health care provider's office than when you are at home, this is called white coat hypertension. Most people with this condition do not need medicines.  When your blood pressure is higher at home than when you are in a health care provider's office, this is called masked hypertension. Most people with this condition may need medicines to control blood pressure. If you have a high blood pressure reading during one visit or you have normal blood pressure with other risk factors, you may be asked to:  Return on a different day to have your blood pressure checked again.  Monitor your blood pressure at home for 1 week or longer. If you are diagnosed with hypertension, you may have other blood or  imaging tests to help your health care provider understand your overall risk for other conditions. How is this treated? This condition is treated by making healthy lifestyle changes, such as eating healthy foods, exercising more, and reducing your alcohol intake. Your health care provider may prescribe medicine if lifestyle changes are not enough to get your blood pressure under control, and  if:  Your systolic blood pressure is above 130.  Your diastolic blood pressure is above 80. Your personal target blood pressure may vary depending on your medical conditions, your age, and other factors. Follow these instructions at home: Eating and drinking  Eat a diet that is high in fiber and potassium, and low in sodium, added sugar, and fat. An example eating plan is called the DASH (Dietary Approaches to Stop Hypertension) diet. To eat this way: ? Eat plenty of fresh fruits and vegetables. Try to fill one half of your plate at each meal with fruits and vegetables. ? Eat whole grains, such as whole-wheat pasta, brown rice, or whole-grain bread. Fill about one fourth of your plate with whole grains. ? Eat or drink low-fat dairy products, such as skim milk or low-fat yogurt. ? Avoid fatty cuts of meat, processed or cured meats, and poultry with skin. Fill about one fourth of your plate with lean proteins, such as fish, chicken without skin, beans, eggs, or tofu. ? Avoid pre-made and processed foods. These tend to be higher in sodium, added sugar, and fat.  Reduce your daily sodium intake. Most people with hypertension should eat less than 1,500 mg of sodium a day.  Do not drink alcohol if: ? Your health care provider tells you not to drink. ? You are pregnant, may be pregnant, or are planning to become pregnant.  If you drink alcohol: ? Limit how much you use to:  0-1 drink a day for women.  0-2 drinks a day for men. ? Be aware of how much alcohol is in your drink. In the [place] one drink equals one 12 oz bottle of beer (355 mL), one 5 oz glass of wine (148 mL), or one 1 oz glass of hard liquor (44 mL).   Lifestyle  Work with your health care provider to maintain a healthy body weight or to lose weight. Ask what an ideal weight is for you.  Get at least 30 minutes of exercise most days of the week. Activities may include walking, swimming, or biking.  Include exercise to  strengthen your muscles (resistance exercise), such as Pilates or lifting weights, as part of your weekly exercise routine. Try to do these types of exercises for 30 minutes at least 3 days a week.  Do not use any products that contain nicotine or tobacco, such as cigarettes, e-cigarettes, and chewing tobacco. If you need help quitting, ask your health care provider.  Monitor your blood pressure at home as told by your health care provider.  Keep all follow-up visits as told by your health care provider. This is important.   Medicines  Take over-the-counter and prescription medicines only as told by your health care provider. Follow directions carefully. Blood pressure medicines must be taken as prescribed.  Do not skip doses of blood pressure medicine. Doing this puts you at risk for problems and can make the medicine less effective.  Ask your health care provider about side effects or reactions to medicines that you should watch for. Contact a health care provider if you:  Think you are having a reaction to a  medicine you are taking.  Have headaches that keep coming back (recurring).  Feel dizzy.  Have swelling in your ankles.  Have trouble with your vision. Get help right away if you:  Develop a severe headache or confusion.  Have unusual weakness or numbness.  Feel faint.  Have severe pain in your chest or abdomen.  Vomit repeatedly.  Have trouble breathing. Summary  Hypertension is when the force of blood pumping through your arteries is too strong. If this condition is not controlled, it may put you at risk for serious complications.  Your personal target blood pressure may vary depending on your medical conditions, your age, and other factors. For most people, a normal blood pressure is less than 120/80.  Hypertension is treated with lifestyle changes, medicines, or a combination of both. Lifestyle changes include losing weight, eating a healthy, low-sodium diet,  exercising more, and limiting alcohol. This information is not intended to replace advice given to you by your health care provider. Make sure you discuss any questions you have with your health care provider. Document Revised: 11/26/2017 Document Reviewed: 11/26/2017 Elsevier Patient Education  2021 [place] Your Hypertension Hypertension, also called high blood pressure, is when the force of the blood pressing against the walls of the arteries is too strong. Arteries are blood vessels that carry blood from your heart throughout your body. Hypertension forces the heart to work harder to pump blood and may cause the arteries to become narrow or stiff. Understanding blood pressure readings Your personal target blood pressure may vary depending on your medical conditions, your age, and other factors. A blood pressure reading includes a higher number over a lower number. Ideally, your blood pressure should be below 120/80. You should know that:  The first, or top, number is called the systolic pressure. It is a measure of the pressure in your arteries as your heart beats.  The second, or bottom number, is called the diastolic pressure. It is a measure of the pressure in your arteries as the heart relaxes. Blood pressure is classified into four stages. Based on your blood pressure reading, your health care provider may use the following stages to determine what type of treatment you need, if any. Systolic pressure and diastolic pressure are measured in a unit called mmHg. Normal  Systolic pressure: below 347.  Diastolic pressure: below 80. Elevated  Systolic pressure: 425-956.  Diastolic pressure: below 80. Hypertension stage 1  Systolic pressure: 387-564.  Diastolic pressure: 33-29. Hypertension stage 2  Systolic pressure: 518 or above.  Diastolic pressure: 90 or above. How can this condition affect me? Managing your hypertension is an important responsibility. Over  time, hypertension can damage the arteries and decrease blood flow to important parts of the body, including the brain, heart, and kidneys. Having untreated or uncontrolled hypertension can lead to:  A heart attack.  A stroke.  A weakened blood vessel (aneurysm).  Heart failure.  Kidney damage.  Eye damage.  Metabolic syndrome.  Memory and concentration problems.  Vascular dementia. What actions can I take to manage this condition? Hypertension can be managed by making lifestyle changes and possibly by taking medicines. Your health care provider will help you make a plan to bring your blood pressure within a normal range. Nutrition  Eat a diet that is high in fiber and potassium, and low in salt (sodium), added sugar, and fat. An example eating plan is called the Dietary Approaches to Stop Hypertension (DASH) diet. To eat this  way: ? Eat plenty of fresh fruits and vegetables. Try to fill one-half of your plate at each meal with fruits and vegetables. ? Eat whole grains, such as whole-wheat pasta, brown rice, or whole-grain bread. Fill about one-fourth of your plate with whole grains. ? Eat low-fat dairy products. ? Avoid fatty cuts of meat, processed or cured meats, and poultry with skin. Fill about one-fourth of your plate with lean proteins such as fish, chicken without skin, beans, eggs, and tofu. ? Avoid pre-made and processed foods. These tend to be higher in sodium, added sugar, and fat.  Reduce your daily sodium intake. Most people with hypertension should eat less than 1,500 mg of sodium a day.   Lifestyle  Work with your health care provider to maintain a healthy body weight or to lose weight. Ask what an ideal weight is for you.  Get at least 30 minutes of exercise that causes your heart to beat faster (aerobic exercise) most days of the week. Activities may include walking, swimming, or biking.  Include exercise to strengthen your muscles (resistance exercise), such as  weight lifting, as part of your weekly exercise routine. Try to do these types of exercises for 30 minutes at least 3 days a week.  Do not use any products that contain nicotine or tobacco, such as cigarettes, e-cigarettes, and chewing tobacco. If you need help quitting, ask your health care provider.  Control any long-term (chronic) conditions you have, such as high cholesterol or diabetes.  Identify your sources of stress and find ways to manage stress. This may include meditation, deep breathing, or making time for fun activities.   Alcohol use  Do not drink alcohol if: ? Your health care provider tells you not to drink. ? You are pregnant, may be pregnant, or are planning to become pregnant.  If you drink alcohol: ? Limit how much you use to:  0-1 drink a day for women.  0-2 drinks a day for men. ? Be aware of how much alcohol is in your drink. In the [place] one drink equals one 12 oz bottle of beer (355 mL), one 5 oz glass of wine (148 mL), or one 1 oz glass of hard liquor (44 mL). Medicines Your health care provider may prescribe medicine if lifestyle changes are not enough to get your blood pressure under control and if:  Your systolic blood pressure is 130 or higher.  Your diastolic blood pressure is 80 or higher. Take medicines only as told by your health care provider. Follow the directions carefully. Blood pressure medicines must be taken as told by your health care provider. The medicine does not work as well when you skip doses. Skipping doses also puts you at risk for problems. Monitoring Before you monitor your blood pressure:  Do not smoke, drink caffeinated beverages, or exercise within 30 minutes before taking a measurement.  Use the bathroom and empty your bladder (urinate).  Sit quietly for at least 5 minutes before taking measurements. Monitor your blood pressure at home as told by your health care provider. To do this:  Sit with your back straight and  supported.  Place your feet flat on the floor. Do not cross your legs.  Support your arm on a flat surface, such as a table. Make sure your upper arm is at heart level.  Each time you measure, take two or three readings one minute apart and record the results. You may also need to have your blood pressure checked regularly  by your health care provider.   General information  Talk with your health care provider about your diet, exercise habits, and other lifestyle factors that may be contributing to hypertension.  Review all the medicines you take with your health care provider because there may be side effects or interactions.  Keep all visits as told by your health care provider. Your health care provider can help you create and adjust your plan for managing your high blood pressure. Where to find more information  National Heart, Lung, and Blood Institute: https://wilson-eaton.com/  American Heart Association: www.heart.org Contact a health care provider if:  You think you are having a reaction to medicines you have taken.  You have repeated (recurrent) headaches.  You feel dizzy.  You have swelling in your ankles.  You have trouble with your vision. Get help right away if:  You develop a severe headache or confusion.  You have unusual weakness or numbness, or you feel faint.  You have severe pain in your chest or abdomen.  You vomit repeatedly.  You have trouble breathing. These symptoms may represent a serious problem that is an emergency. Do not wait to see if the symptoms will go away. Get medical help right away. Call your local emergency services (911 in the [place] Do not drive yourself to the hospital. Summary  Hypertension is when the force of blood pumping through your arteries is too strong. If this condition is not controlled, it may put you at risk for serious complications.  Your personal target blood pressure may vary depending on your medical conditions, your  age, and other factors. For most people, a normal blood pressure is less than 120/80.  Hypertension is managed by lifestyle changes, medicines, or both.  Lifestyle changes to help manage hypertension include losing weight, eating a healthy, low-sodium diet, exercising more, stopping smoking, and limiting alcohol. This information is not intended to replace advice given to you by your health care provider. Make sure you discuss any questions you have with your health care provider. Document Revised: 04/23/2019 Document Reviewed: 02/16/2019 Elsevier Patient Education  2021 [employer].

## 2020-04-27 NOTE — Discharge Summary (Signed)
Physician Discharge Summary  Kim Blankenship [id number] DOB: 07-19-1945 DOA: 04/25/2020  PCP: Jolinda Croak, MD  Admit date: 04/25/2020 Discharge date: 04/27/2020  Admitted From: Home Disposition: Home Recommendations for Outpatient Follow-up:  1. Follow up with PCP in 1-2 weeks 2. Please obtain BMP/CBC in one week  Home Health: None Equipment/Devices: None Discharge Condition stable CODE STATUS full code  Diet recommendation: cardiac Brief/Interim Summary:Kim Saxtonis a 75 y.o.femalewith medical history significant ofCOPD, seizure disorder, PAF not on anticoagulants, stroke, HTN.  Pt presents to ED due to AMS. Pt has h/o dementia. Daughter, who lives with pt and manages her meds, noted that pt acutely confused this AM at 9AM.  Daughter found pt walking outside and pt unable to tell daughter what she was doing. Pt improved and pt back to baseline ~1.5h later. Then at 2pm pt having some SOB and labored breathing. This is now resolved.  Daughter found open bottle of oxycodone on vanity with 6-8 pills left. Pt not supposed to be taking narcotics chronically. Pt has been complaining of neck pain and R groin pain over past few days.  Most concerning, pt has 5 prior episodes in the past 1 year of ED visits (1 was also admission) for confirmed or suspected opiod overdoses (today will be #6)!  Discharge Diagnoses:  Principal Problem:   Delirium Active Problems:   COPD (chronic obstructive pulmonary disease) (HCC)   Essential hypertension   Acute encephalopathy   Overdose opiate, accidental or unintentional, initial encounter ([place])   Acute metabolic encephalopathy  #1 acute metabolic encephalopathy secondary to accidental overdose of narcotics.  Patient was given Narcan by EMS.  She was admitted to the floor given IV fluids and narcotics were on hold.  Her symptoms improved her mental status improved and she was back to baseline prior to discharge.  Discussed with  patient and the daughter that the daughter must assist and supervise and giving her the mother and the medications on a daily basis. Patient lives at home with her daughter.  #2 history of dementia with occasional bouts of psychosis on Zyprexa and Seroquel.  #3 history of essential hypertension continue home meds.  #4 history of COPD stable continue home meds.    Estimated body mass index is 21.91 kg/m as calculated from the following:   Height as of this encounter: 5\' 4"  (1.626 m).   Weight as of this encounter: 57.9 kg.  Discharge Instructions  Discharge Instructions    Call MD for:  difficulty breathing, headache or visual disturbances   Complete by: As directed    Call MD for:  persistant dizziness or light-headedness   Complete by: As directed    Call MD for:  persistant nausea and vomiting   Complete by: As directed    Call MD for:  severe uncontrolled pain   Complete by: As directed    Diet - low sodium heart healthy   Complete by: As directed    Increase activity slowly   Complete by: As directed      Allergies as of 04/27/2020      Reactions   Levofloxacin    Other reaction(s): Other Leg pain   Tape Other (See Comments)   THE PATIENT'S SKIN IS THIN AND TEARS VERY EASILY (she "picks" at it; please use coban wrap)      Medication List    STOP taking these medications   ibuprofen 800 MG tablet Commonly known as: ADVIL     TAKE these medications   albuterol  108 (90 Base) MCG/ACT inhaler Commonly known as: VENTOLIN HFA Inhale 2 puffs into the lungs every 4 (four) hours as needed for wheezing or shortness of breath.   amLODipine 2.5 MG tablet Commonly known as: NORVASC Take 2.5 mg by mouth daily.   aspirin EC 81 MG tablet Take 81 mg by mouth daily.   atorvastatin 20 MG tablet Commonly known as: LIPITOR Take 20 mg by mouth daily.   busPIRone 10 MG tablet Commonly known as: BUSPAR Take 10 mg by mouth 2 (two) times daily.   calcium carbonate 500  MG chewable tablet Commonly known as: TUMS - dosed in mg elemental calcium Chew 1 tablet by mouth daily as needed for indigestion or heartburn.   Combivent Respimat 20-100 MCG/ACT Aers respimat Generic drug: Ipratropium-Albuterol Inhale 1 puff into the lungs every 6 (six) hours.   ferrous sulfate 325 (65 FE) MG tablet Take 1 tablet (325 mg total) by mouth 2 (two) times daily with a meal.   folic acid 1 MG tablet Commonly known as: FOLVITE Take 1 tablet (1 mg total) by mouth daily.   metoprolol tartrate 50 MG tablet Commonly known as: LOPRESSOR Take 50 mg by mouth 2 (two) times daily.   multivitamin with minerals Tabs tablet Take 1 tablet by mouth daily.   OLANZapine 2.5 MG tablet Commonly known as: ZYPREXA Take 2.5 mg by mouth at bedtime.   QUEtiapine 100 MG tablet Commonly known as: SEROQUEL Take 200 mg by mouth at bedtime.   spironolactone 25 MG tablet Commonly known as: ALDACTONE Take 25 mg by mouth daily.   thiamine 100 MG tablet Take 1 tablet (100 mg total) by mouth daily.   tiZANidine 2 MG tablet Commonly known as: ZANAFLEX Take 2 mg by mouth at bedtime.       Follow-up Information    Jolinda Croak, MD Follow up.   Specialty: Family Medicine Contact information: [place] [address] [phone]              Allergies  Allergen Reactions  . Levofloxacin     Other reaction(s): Other Leg pain  . Tape Other (See Comments)    THE PATIENT'S SKIN IS THIN AND TEARS VERY EASILY (she "picks" at it; please use coban wrap)    Consultations:  none   Procedures/Studies: CT Head Wo Contrast  Result Date: 04/25/2020 CLINICAL DATA:  History of dementia of patient presenting with episode of confusion dizziness x3 days EXAM: CT HEAD WITHOUT CONTRAST TECHNIQUE: Contiguous axial images were obtained from the base of the skull through the vertex without intravenous contrast. COMPARISON:  Head CT July 22, 2019 and brain MR  October 30, 2016 FINDINGS: Brain: No evidence of acute infarction, hemorrhage, hydrocephalus, extra-axial collection or mass lesions/effect. Again seen is mild patchy areas of subcortical and periventricular white matter hypoattenuation, nonspecific but most consistent with chronic microvascular ischemic change. Vascular: No hyperdense vessel or unexpected calcification. Skull: Normal. Negative for fracture or focal lesion. Sinuses/Orbits: No acute finding. Other: None. IMPRESSION: 1. No acute intracranial findings. 2. Mild sequela of chronic microvascular white matter disease, similar to prior. Electronically Signed   By: Dahlia Bailiff MD   On: 04/25/2020 15:22   DG Chest Portable 1 View  Result Date: 04/25/2020 CLINICAL DATA:  75 year old female with altered mental status. EXAM: PORTABLE CHEST 1 VIEW COMPARISON:  Chest radiograph dated 07/22/2019 and CT dated 08/07/2019. FINDINGS: Background of emphysema. No focal consolidation, pleural effusion or pneumothorax. The cardiac  silhouette is within limits. Atherosclerotic calcification of the aorta. No acute osseous pathology. IMPRESSION: No active disease. Electronically Signed   By: Anner Crete M.D.   On: 04/25/2020 15:21    (Echo, Carotid, EGD, Colonoscopy, ERCP)    Subjective:  Patient is resting in bed awake and alert anxious to go home Discharge Exam: Vitals:   04/27/20 0626 04/27/20 0950  BP: 132/80 134/65  Pulse: 74 83  Resp: 16 18  Temp: (!) 97.3 F (36.3 C) 97.6 F (36.4 C)  SpO2: 93% 93%   Vitals:   04/26/20 2317 04/27/20 0154 04/27/20 0626 04/27/20 0950  BP: 114/62 104/63 132/80 134/65  Pulse: 67 (!) 55 74 83  Resp:  16 16 18   Temp:  97.7 F (36.5 C) (!) 97.3 F (36.3 C) 97.6 F (36.4 C)  TempSrc:  Oral Oral Oral  SpO2:  96% 93% 93%  Weight:      Height:        General: Pt is alert, awake, not in acute distress Cardiovascular: RRR, S1/S2 +, no rubs, no gallops Respiratory: CTA bilaterally, no wheezing, no  rhonchi Abdominal: Soft, NT, ND, bowel sounds + Extremities: no edema, no cyanosis    The results of significant diagnostics from this hospitalization (including imaging, microbiology, ancillary and laboratory) are listed below for reference.     Microbiology: Recent Results (from the past 240 hour(s))  SARS Coronavirus 2 by RT PCR (hospital order, performed in [facility] hospital lab) Nasopharyngeal Nasopharyngeal Swab     Status: None   Collection Time: 04/25/20  3:37 PM   Specimen: Nasopharyngeal Swab  Result Value Ref Range Status   SARS Coronavirus 2 NEGATIVE NEGATIVE Final    Comment: (NOTE) SARS-CoV-2 target nucleic acids are NOT DETECTED.  The SARS-CoV-2 RNA is generally detectable in upper and lower respiratory specimens during the acute phase of infection. The lowest concentration of SARS-CoV-2 viral copies this assay can detect is 250 copies / mL. A negative result does not preclude SARS-CoV-2 infection and should not be used as the sole basis for treatment or other patient management decisions.  A negative result may occur with improper specimen collection / handling, submission of specimen other than nasopharyngeal swab, presence of viral mutation(s) within the areas targeted by this assay, and inadequate number of viral copies (<250 copies / mL). A negative result must be combined with clinical observations, patient history, and epidemiological information.  Fact Sheet for Patients:   StrictlyIdeas.no  Fact Sheet for Healthcare Providers: BankingDealers.co.za  This test is not yet approved or  cleared by the [place] FDA and has been authorized for detection and/or diagnosis of SARS-CoV-2 by FDA under an Emergency Use Authorization (EUA).  This EUA will remain in effect (meaning this test can be used) for the duration of the COVID-19 declaration under Section 564(b)(1) of the Act, 21 U.S.C. section  360bbb-3(b)(1), unless the authorization is terminated or revoked sooner.  Performed at [facility], [place] [address], [place], [place] [address]   Urine culture     Status: Abnormal   Collection Time: 04/25/20  6:15 PM   Specimen: Urine, Random  Result Value Ref Range Status   Specimen Description   Final    URINE, RANDOM Performed at [place] [address] [place], [place] [address]    Special Requests   Final    NONE Performed at [facility], [place] [address], [place], [place] [address]    Culture (A)  Final    10,000 COLONIES/mL MULTIPLE SPECIES PRESENT, SUGGEST RECOLLECTION   Report Status 04/27/2020 FINAL  Final     Labs: BNP (last 3 results) No results for input(s): BNP in the last 8760 hours. Basic Metabolic Panel: Recent Labs  Lab 04/25/20 1450 04/27/20 0551  NA 140 139  K 3.5 3.8  CL 99 101  CO2 30 26  GLUCOSE 106* 94  BUN 9 18  CREATININE 0.79 0.84  CALCIUM 9.3 9.4   Liver Function Tests: Recent Labs  Lab 04/25/20 1450 04/27/20 0551  AST 30 31  ALT 20 21  ALKPHOS 68 54  BILITOT 0.7 1.3*  PROT 7.4 6.6  ALBUMIN 4.5 4.1   Recent Labs  Lab 04/25/20 1450  LIPASE 21   Recent Labs  Lab 04/25/20 2312  AMMONIA 11   CBC: Recent Labs  Lab 04/25/20 1450 04/27/20 0551  WBC 9.4 6.6  NEUTROABS 5.5  --   HGB 15.8* 13.8  HCT 47.0* 41.3  MCV 96.1 96.0  PLT 347 280   Cardiac Enzymes: No results for input(s): CKTOTAL, CKMB, CKMBINDEX, TROPONINI in the last 168 hours. BNP: Invalid input(s): POCBNP CBG: No results for input(s): GLUCAP in the last 168 hours. D-Dimer No results for input(s): DDIMER in the last 72 hours. Hgb A1c No results for input(s): HGBA1C in the last 72 hours. Lipid Profile No results for input(s): CHOL, HDL, LDLCALC, TRIG, CHOLHDL, LDLDIRECT in the last 72 hours. Thyroid function studies Recent Labs    04/25/20 2312  TSH 1.644   Anemia work up No results  for input(s): VITAMINB12, FOLATE, FERRITIN, TIBC, IRON, RETICCTPCT in the last 72 hours. Urinalysis    Component Value Date/Time   COLORURINE YELLOW 04/25/2020 1536   APPEARANCEUR CLEAR 04/25/2020 1536   LABSPEC 1.011 04/25/2020 1536   PHURINE 6.0 04/25/2020 1536   GLUCOSEU NEGATIVE 04/25/2020 1536   HGBUR NEGATIVE 04/25/2020 1536   [place] 04/25/2020 1536   KETONESUR 5 (A) 04/25/2020 1536   PROTEINUR NEGATIVE 04/25/2020 1536   NITRITE NEGATIVE 04/25/2020 1536   LEUKOCYTESUR LARGE (A) 04/25/2020 1536   Sepsis Labs Invalid input(s): PROCALCITONIN,  WBC,  LACTICIDVEN Microbiology Recent Results (from the past 240 hour(s))  SARS Coronavirus 2 by RT PCR (hospital order, performed in [place] hospital lab) Nasopharyngeal Nasopharyngeal Swab     Status: None   Collection Time: 04/25/20  3:37 PM   Specimen: Nasopharyngeal Swab  Result Value Ref Range Status   SARS Coronavirus 2 NEGATIVE NEGATIVE Final    Comment: (NOTE) SARS-CoV-2 target nucleic acids are NOT DETECTED.  The SARS-CoV-2 RNA is generally detectable in upper and lower respiratory specimens during the acute phase of infection. The lowest concentration of SARS-CoV-2 viral copies this assay can detect is 250 copies / mL. A negative result does not preclude SARS-CoV-2 infection and should not be used as the sole basis for treatment or other patient management decisions.  A negative result may occur with improper specimen collection / handling, submission of specimen other than nasopharyngeal swab, presence of viral mutation(s) within the areas targeted by this assay, and inadequate number of viral copies (<250 copies / mL). A negative result must be combined with clinical observations, patient history, and epidemiological information.  Fact Sheet for Patients:   StrictlyIdeas.no  Fact Sheet for Healthcare Providers: BankingDealers.co.za  This test is not yet  approved or  cleared by the [place] FDA and has been authorized for detection and/or diagnosis of SARS-CoV-2 by FDA under an Emergency Use  Authorization (EUA).  This EUA will remain in effect (meaning this test can be used) for the duration of the COVID-19 declaration under Section 564(b)(1) of the Act, 21 U.S.C. section 360bbb-3(b)(1), unless the authorization is terminated or revoked sooner.  Performed at [facility], [place] [address], [place], [place] [address]   Urine culture     Status: Abnormal   Collection Time: 04/25/20  6:15 PM   Specimen: Urine, Random  Result Value Ref Range Status   Specimen Description   Final    URINE, RANDOM Performed at [place] [address], [place], [place] [address]    Special Requests   Final    NONE Performed at [facility], [place] [address] [place], [place] [address]    Culture (A)  Final    10,000 COLONIES/mL MULTIPLE SPECIES PRESENT, SUGGEST RECOLLECTION   Report Status 04/27/2020 FINAL  Final     Time coordinating discharge:  39 minutes  SIGNED: Georgette Shell, MD  [facility] 04/27/2020, 2:54 PM

## 2020-04-27 NOTE — Progress Notes (Signed)
PT Cancellation/Screen Note  Patient Details Name: Kim Blankenship MRN: [id number] DOB: 08-25-1945   Cancelled Treatment:    Reason Eval/Treat Not Completed: PT screened, no needs identified, will sign off (Per chart review and communication with RN, pt has been mobilizing independently in hospital room. She is getting dressed and ready to discharge. Per prior admission last April pt also supervision/independent for mobility. Will sign off at this time, please reconsult if there is a change in functional status.)   Verner Mould, DPT Acute Rehabilitation Services Office [phone] Pager [phone]   Jacques Navy 04/27/2020, 11:14 AM

## 2020-07-22 ENCOUNTER — Encounter (HOSPITAL_BASED_OUTPATIENT_CLINIC_OR_DEPARTMENT_OTHER): Payer: Self-pay | Admitting: Emergency Medicine

## 2020-07-22 ENCOUNTER — Other Ambulatory Visit: Payer: Self-pay

## 2020-07-22 ENCOUNTER — Emergency Department (HOSPITAL_BASED_OUTPATIENT_CLINIC_OR_DEPARTMENT_OTHER): Payer: Medicare Other

## 2020-07-22 ENCOUNTER — Emergency Department (HOSPITAL_BASED_OUTPATIENT_CLINIC_OR_DEPARTMENT_OTHER)
Admission: EM | Admit: 2020-07-22 | Discharge: 2020-07-22 | Disposition: A | Discharge status: Home / Self Care | Payer: Medicare Other | Attending: Emergency Medicine | Admitting: Emergency Medicine

## 2020-07-22 DIAGNOSIS — Z23 Encounter for immunization: Secondary | ICD-10-CM | POA: Insufficient documentation

## 2020-07-22 DIAGNOSIS — Z7951 Long term (current) use of inhaled steroids: Secondary | ICD-10-CM | POA: Insufficient documentation

## 2020-07-22 DIAGNOSIS — J45909 Unspecified asthma, uncomplicated: Secondary | ICD-10-CM | POA: Insufficient documentation

## 2020-07-22 DIAGNOSIS — I110 Hypertensive heart disease with heart failure: Secondary | ICD-10-CM | POA: Diagnosis not present

## 2020-07-22 DIAGNOSIS — J449 Chronic obstructive pulmonary disease, unspecified: Secondary | ICD-10-CM | POA: Diagnosis not present

## 2020-07-22 DIAGNOSIS — Z87891 Personal history of nicotine dependence: Secondary | ICD-10-CM | POA: Insufficient documentation

## 2020-07-22 DIAGNOSIS — Z79899 Other long term (current) drug therapy: Secondary | ICD-10-CM | POA: Insufficient documentation

## 2020-07-22 DIAGNOSIS — I5032 Chronic diastolic (congestive) heart failure: Secondary | ICD-10-CM | POA: Diagnosis not present

## 2020-07-22 DIAGNOSIS — Y9339 Activity, other involving climbing, rappelling and jumping off: Secondary | ICD-10-CM | POA: Insufficient documentation

## 2020-07-22 DIAGNOSIS — S8991XA Unspecified injury of right lower leg, initial encounter: Secondary | ICD-10-CM | POA: Diagnosis present

## 2020-07-22 DIAGNOSIS — Z7982 Long term (current) use of aspirin: Secondary | ICD-10-CM | POA: Diagnosis not present

## 2020-07-22 DIAGNOSIS — Z8541 Personal history of malignant neoplasm of cervix uteri: Secondary | ICD-10-CM | POA: Diagnosis not present

## 2020-07-22 DIAGNOSIS — Z96641 Presence of right artificial hip joint: Secondary | ICD-10-CM | POA: Insufficient documentation

## 2020-07-22 DIAGNOSIS — W108XXA Fall (on) (from) other stairs and steps, initial encounter: Secondary | ICD-10-CM | POA: Insufficient documentation

## 2020-07-22 DIAGNOSIS — W19XXXA Unspecified fall, initial encounter: Secondary | ICD-10-CM

## 2020-07-22 DIAGNOSIS — S81811A Laceration without foreign body, right lower leg, initial encounter: Secondary | ICD-10-CM

## 2020-07-22 DIAGNOSIS — S60222A Contusion of left hand, initial encounter: Secondary | ICD-10-CM

## 2020-07-22 MED ORDER — TETANUS-DIPHTH-ACELL PERTUSSIS 5-2.5-18.5 LF-MCG/0.5 IM SUSY
0.5000 mL | PREFILLED_SYRINGE | Freq: Once | INTRAMUSCULAR | Status: AC
  Administered 2020-07-22: 0.5 mL via INTRAMUSCULAR
  Filled 2020-07-22: qty 0.5

## 2020-07-22 NOTE — ED Triage Notes (Signed)
Pt reports fell going up the stairs yesterday causing injury to right leg. Pt reports today she noticed bruising to left hand.

## 2020-07-22 NOTE — ED Notes (Signed)
Patient transported to X-ray 

## 2020-07-22 NOTE — Discharge Instructions (Addendum)
Please continue to monitor your symptoms.  Please follow-up with your regular doctor if they persist.  Steri-Strips on your right leg will come off naturally in the next few days.  Please continue to bathe and shower and wash the region as needed.  If you develop any swelling, discharge from the wound, fevers, chills, nausea, vomiting, please return to the emergency department for reevaluation.  It was a pleasure to meet you.

## 2020-07-22 NOTE — ED Provider Notes (Signed)
[facility] EMERGENCY DEPARTMENT Provider Note   CSN: [id number] Arrival date & time: 07/22/20  1243     History Chief Complaint  Patient presents with  . Fall    Kim Blankenship is a 75 y.o. female.  HPI Patient is a 75 year old female with a history of atrial fibrillation, COPD, hypertension, CVA, who presents the emergency department due to a fall yesterday.  Patient states she was climbing up metal stairs yesterday and tripped and fell forwards and struck the right leg on the stairs as well as her left hand.  She reports a skin tear to the skin on the right shin.  She states that she was having mild pain in the region but decided to not be evaluated yesterday.  She states earlier today she noticed ecchymosis developing in the left hand across the first 3 digits as well as the dorsum of the left hand.  This concerned she and her daughter, so she came to the emergency department for evaluation.  She denies any pain in the left hand.  Denies any difficulty with range of motion or grip.  States that she has been ambulatory since the fall.  Denies any head trauma or LOC.  No neck or back pain.    Past Medical History:  Diagnosis Date  . Asthma   . Atrial fibrillation ([place])   . Cancer ([place])    cervical  . COPD (chronic obstructive pulmonary disease) ([place])   . Hypertension   . Irregular heart rate   . PRES (posterior reversible encephalopathy syndrome)   . Stroke [facility])     Patient Active Problem List   Diagnosis Date Noted  . Acute metabolic encephalopathy 78/46/9629  . Acute respiratory failure with hypoxia ([place]) 07/23/2019  . Acute encephalopathy 07/22/2019  . Overdose opiate, accidental or unintentional, initial encounter ([place]) 07/22/2019  . Vulvar cancer ([place]) 07/22/2019  . Delirium   . GIB (gastrointestinal bleeding) 12/04/2018  . Chronic diastolic CHF (congestive heart failure) ([place]) 12/04/2018  . 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 Stroke (cerebrum) ([place]) 11/21/2016  . Hyponatremia 11/20/2016  . UTI (urinary tract infection) 11/20/2016  . HLD (hyperlipidemia) 11/20/2016  . GERD (gastroesophageal reflux disease) 11/20/2016  . Depression 11/20/2016  . Hypertensive emergency 11/04/2016  . EtOH dependence ([place]) 11/04/2016  . Hyperglycemia 11/04/2016  . Essential hypertension   . Hypertensive urgency 10/30/2016  . PAF (paroxysmal atrial fibrillation) ([place]) 10/30/2016  . PRES (posterior reversible encephalopathy syndrome) 10/30/2016  . Seizure ([place])   . Acute blood loss anemia   . Closed fracture of right hip ([place]) 02/09/2016  . Chest pain 11/07/2015  . COPD (chronic obstructive pulmonary disease) ([place]) 08/19/2013  . Abnormal EKG 08/19/2013  . Fever, unspecified 08/19/2013    Past Surgical History:  Procedure Laterality Date  . CESAREAN SECTION    . Left Foot Reconstruction    . OPEN REDUCTION INTERNAL FIXATION (ORIF) METACARPAL Right 03/10/2015   Procedure: OPEN REDUCTION INTERNAL FIXATION (ORIF) RIGHT LONG  METACARPAL FRACTURE;  Surgeon: Leanora Cover, MD;  Location: [facility];  Service: Orthopedics;  Laterality: Right;  . TONSILLECTOMY    . TOTAL HIP ARTHROPLASTY Right 02/09/2016   Procedure: TOTAL HIP ARTHROPLASTY ANTERIOR APPROACH;  Surgeon: Frederik Pear, MD;  Location: [facility];  Service: Orthopedics;  Laterality: Right;     OB History    Gravida      Para      Term  Preterm      AB      Living  1     SAB      IAB      Ectopic      Multiple      Live Births              Family History  Problem Relation Age of Onset  . Dementia Father     Social History   Tobacco Use  . Smoking status: Former Smoker    Packs/day: 2.00    Years: 30.00    Pack years: 60.00    Types: Cigarettes    Quit date: 04/1995    Years since quitting: 25.3  . Smokeless tobacco: Never Used  Vaping Use  . Vaping Use: Never used  Substance Use Topics  . Alcohol  use: Yes    Comment: wine daily  . Drug use: No    Home Medications Prior to Admission medications   Medication Sig Start Date End Date Taking? Authorizing Provider  albuterol (VENTOLIN HFA) 108 (90 Base) MCG/ACT inhaler Inhale 2 puffs into the lungs every 4 (four) hours as needed for wheezing or shortness of breath.    [provider]  amLODipine (NORVASC) 2.5 MG tablet Take 2.5 mg by mouth daily.    [provider]  aspirin EC 81 MG tablet Take 81 mg by mouth daily.    [provider]  atorvastatin (LIPITOR) 20 MG tablet Take 20 mg by mouth daily. 04/08/16   [provider]  busPIRone (BUSPAR) 10 MG tablet Take 10 mg by mouth 2 (two) times daily. 01/29/20   [provider]  calcium carbonate (TUMS - DOSED IN MG ELEMENTAL CALCIUM) 500 MG chewable tablet Chew 1 tablet by mouth daily as needed for indigestion or heartburn.    [provider]  ferrous sulfate 325 (65 FE) MG tablet Take 1 tablet (325 mg total) by mouth 2 (two) times daily with a meal. 12/10/18   Hongalgi, Maximino [place], MD  folic acid (FOLVITE) 1 MG tablet Take 1 tablet (1 mg total) by mouth daily. 12/11/18   Hongalgi, Maximino [place], MD  Ipratropium-Albuterol (COMBIVENT RESPIMAT) 20-100 MCG/ACT AERS respimat Inhale 1 puff into the lungs every 6 (six) hours.    [provider]  metoprolol tartrate (LOPRESSOR) 50 MG tablet Take 50 mg by mouth 2 (two) times daily. 01/29/20   [provider]  Multiple Vitamin (MULTIVITAMIN WITH MINERALS) TABS tablet Take 1 tablet by mouth daily. 12/11/18   Hongalgi, Maximino [place], MD  OLANZapine (ZYPREXA) 2.5 MG tablet Take 2.5 mg by mouth at bedtime.    [provider]  QUEtiapine (SEROQUEL) 100 MG tablet Take 200 mg by mouth at bedtime.     [provider]  spironolactone (ALDACTONE) 25 MG tablet Take 25 mg by mouth daily.    [provider]  thiamine 100 MG tablet Take 1 tablet (100 mg total) by mouth daily. 12/11/18    Hongalgi, Maximino [place], MD  tiZANidine (ZANAFLEX) 2 MG tablet Take 2 mg by mouth at bedtime.    [provider]    Allergies    Levofloxacin and Tape  Review of Systems   Review of Systems  All other systems reviewed and are negative. Ten systems reviewed and are negative for acute change, except as noted in the HPI.   Physical Exam Updated Vital Signs BP (!) 158/108 (BP Location: Right Arm)   Pulse 73   Temp 98 F (36.7  C)   Resp 20   Ht 5\' 4"  (1.626 m)   Wt 49.9 kg   SpO2 95%   BMI 18.88 kg/m   Physical Exam Vitals and nursing note reviewed.  Constitutional:      General: She is not in acute distress.    Appearance: Normal appearance. She is not ill-appearing, toxic-appearing or diaphoretic.  HENT:     Head: Normocephalic and atraumatic.     Right Ear: External ear normal.     Left Ear: External ear normal.     Nose: Nose normal.     Mouth/Throat:     Mouth: Mucous membranes are moist.     Pharynx: Oropharynx is clear. No oropharyngeal exudate or posterior oropharyngeal erythema.  Eyes:     Extraocular Movements: Extraocular movements intact.  Neck:     Comments: No midline C, T, or L-spine tenderness. Cardiovascular:     Rate and Rhythm: Normal rate and regular rhythm.     Pulses: Normal pulses.     Heart sounds: Normal heart sounds. No murmur heard. No friction rub. No gallop.   Pulmonary:     Effort: Pulmonary effort is normal. No respiratory distress.     Breath sounds: Normal breath sounds. No stridor. No wheezing, rhonchi or rales.  Abdominal:     General: Abdomen is flat.     Tenderness: There is no abdominal tenderness.  Musculoskeletal:        General: Tenderness present. Normal range of motion.     Cervical back: Normal range of motion and neck supple. No tenderness.     Comments: Mild tenderness noted diffusely in the left hand.  Ecchymosis noted diffusely along the dorsum and palmar aspect of the left hand as well as through the first 3 digits.   2+ radial pulses.  Full range of motion of the left wrist and all the fingers of the left hand.  Grip strength intact.  Full range of motion of the hips, knees, and ankles.  Strength is 5 out of 5 in the arms and legs.  Skin:    General: Skin is warm and dry.     Findings: Bruising present.     Comments: 7 cm curved skin tear noted to the right shin.  No active bleeding.  Mild tenderness in the region.  Surrounding ecchymosis.  Neurological:     General: No focal deficit present.     Mental Status: She is alert and oriented to person, place, and time.     Comments: Distal sensation intact.  Strength is 5/5 in all 4 extremities.  Speaking clearly, coherently, and in complete sentences.  No gross deficits.  Psychiatric:        Mood and Affect: Mood normal.        Behavior: Behavior normal.    ED Results / Procedures / Treatments   Labs (all labs ordered are listed, but only abnormal results are displayed) Labs Reviewed - No data to display  EKG None  Radiology DG Tibia/Fibula Right  Result Date: 07/22/2020 CLINICAL DATA:  Pain after fall EXAM: RIGHT TIBIA AND FIBULA - 2 VIEW COMPARISON:  None. FINDINGS: There is no evidence of fracture or other focal bone lesions. Soft tissues are unremarkable. IMPRESSION: Negative. Electronically Signed   By: Dorise Bullion III M.D   On: 07/22/2020 14:13   DG Hand Complete Left  Result Date: 07/22/2020 CLINICAL DATA:  Bruising after fall. EXAM: LEFT HAND - COMPLETE 3+ VIEW COMPARISON:  None. FINDINGS: Degenerative changes  between the base of the first metacarpal and trapezium. Degenerative changes at the first MCP joint. Degenerative changes in the first and third distal interphalangeal joints. More mild degenerative changes in other interphalangeal joints best seen on the lateral view. IMPRESSION: Degenerative changes.  No fractures noted. Electronically Signed   By: Dorise Bullion III M.D   On: 07/22/2020 14:13   Procedures Procedures    Medications Ordered in ED Medications  Tdap (BOOSTRIX) injection 0.5 mL (0.5 mLs Intramuscular Given 07/22/20 1442)    ED Course  I have reviewed the triage vital signs and the nursing notes.  Pertinent labs & imaging results that were available during my care of the patient were reviewed by me and considered in my medical decision making (see chart for details).    MDM Rules/Calculators/A&P                          Patient is a 75 year old female fell on metal stairs yesterday.  She presents the emergency room with ecchymosis on the left hand as well as a skin tear to the right shin.  Wound in the right leg cleaned and approximated with Steri-Strips.  Wound wrapped in [place].  Tdap updated.  Discussed wound care in length.  X-rays of the left hand as well as right tibia/fibula are reassuring.  Neurological exam benign.  No midline spine pain.  No red flags.  Feel the patient is stable for discharge at this time and she is agreeable.  Recommended PCP follow-up if her symptoms persist.  Return to the emergency department if they worsen.  Her questions were answered and she was amicable at the time of discharge.  Final Clinical Impression(s) / ED Diagnoses Final diagnoses:  Fall, initial encounter  Traumatic ecchymosis of left hand, initial encounter  Noninfected skin tear of right lower extremity, initial encounter    Rx / DC Orders ED Discharge Orders    None       Rayna Sexton, PA-C 07/22/20 [place], MD 07/22/20 1506

## 2020-07-22 NOTE — ED Notes (Signed)
Pts daughter called several times wanted information r/t POA and other legal documents. Phone # was called and pt spoke with daughter. Pt did not want [place] to discuss medical updates

## 2020-07-22 NOTE — ED Notes (Signed)
ED Provider at bedside. 

## 2020-08-29 ENCOUNTER — Emergency Department (HOSPITAL_BASED_OUTPATIENT_CLINIC_OR_DEPARTMENT_OTHER): Payer: Medicare Other

## 2020-08-29 ENCOUNTER — Other Ambulatory Visit: Payer: Self-pay

## 2020-08-29 ENCOUNTER — Emergency Department (HOSPITAL_BASED_OUTPATIENT_CLINIC_OR_DEPARTMENT_OTHER)
Admission: EM | Admit: 2020-08-29 | Discharge: 2020-08-29 | Disposition: A | Discharge status: Home / Self Care | Payer: Medicare Other | Attending: Emergency Medicine | Admitting: Emergency Medicine

## 2020-08-29 ENCOUNTER — Encounter (HOSPITAL_BASED_OUTPATIENT_CLINIC_OR_DEPARTMENT_OTHER): Payer: Self-pay

## 2020-08-29 DIAGNOSIS — Z87891 Personal history of nicotine dependence: Secondary | ICD-10-CM | POA: Insufficient documentation

## 2020-08-29 DIAGNOSIS — Z79899 Other long term (current) drug therapy: Secondary | ICD-10-CM | POA: Diagnosis not present

## 2020-08-29 DIAGNOSIS — J45909 Unspecified asthma, uncomplicated: Secondary | ICD-10-CM | POA: Insufficient documentation

## 2020-08-29 DIAGNOSIS — J441 Chronic obstructive pulmonary disease with (acute) exacerbation: Secondary | ICD-10-CM | POA: Diagnosis not present

## 2020-08-29 DIAGNOSIS — W19XXXA Unspecified fall, initial encounter: Secondary | ICD-10-CM | POA: Diagnosis not present

## 2020-08-29 DIAGNOSIS — Z96641 Presence of right artificial hip joint: Secondary | ICD-10-CM | POA: Diagnosis not present

## 2020-08-29 DIAGNOSIS — S61412A Laceration without foreign body of left hand, initial encounter: Secondary | ICD-10-CM | POA: Diagnosis not present

## 2020-08-29 DIAGNOSIS — I10 Essential (primary) hypertension: Secondary | ICD-10-CM | POA: Diagnosis not present

## 2020-08-29 DIAGNOSIS — Z8541 Personal history of malignant neoplasm of cervix uteri: Secondary | ICD-10-CM | POA: Diagnosis not present

## 2020-08-29 DIAGNOSIS — Z7982 Long term (current) use of aspirin: Secondary | ICD-10-CM | POA: Insufficient documentation

## 2020-08-29 DIAGNOSIS — Z5900 Homelessness unspecified: Secondary | ICD-10-CM | POA: Diagnosis not present

## 2020-08-29 DIAGNOSIS — S61411A Laceration without foreign body of right hand, initial encounter: Secondary | ICD-10-CM

## 2020-08-29 DIAGNOSIS — S6992XA Unspecified injury of left wrist, hand and finger(s), initial encounter: Secondary | ICD-10-CM | POA: Diagnosis present

## 2020-08-29 MED ORDER — PREDNISONE 20 MG PO TABS
40.0000 mg | ORAL_TABLET | Freq: Once | ORAL | Status: AC
  Administered 2020-08-29: 40 mg via ORAL
  Filled 2020-08-29: qty 2

## 2020-08-29 MED ORDER — PREDNISONE 10 MG (21) PO TBPK: ORAL_TABLET | ORAL | 0 refills | Status: DC

## 2020-08-29 MED ORDER — IPRATROPIUM-ALBUTEROL 20-100 MCG/ACT IN AERS
1.0000 | INHALATION_SPRAY | Freq: Four times a day (QID) | RESPIRATORY_TRACT | Status: DC | PRN
  Administered 2020-08-29: 1 via RESPIRATORY_TRACT
  Filled 2020-08-29: qty 4

## 2020-08-29 MED ORDER — ALBUTEROL SULFATE (2.5 MG/3ML) 0.083% IN NEBU
2.5000 mg | INHALATION_SOLUTION | Freq: Once | RESPIRATORY_TRACT | Status: AC
  Administered 2020-08-29: 2.5 mg via RESPIRATORY_TRACT
  Filled 2020-08-29: qty 3

## 2020-08-29 MED ORDER — ALBUTEROL SULFATE HFA 108 (90 BASE) MCG/ACT IN AERS
2.0000 | INHALATION_SPRAY | RESPIRATORY_TRACT | Status: DC | PRN
  Administered 2020-08-29: 2 via RESPIRATORY_TRACT
  Filled 2020-08-29: qty 6.7

## 2020-08-29 NOTE — ED Notes (Signed)
Pt ambulatory to room, steady gait. SpO2 maintained 94% with ambulation

## 2020-08-29 NOTE — ED Triage Notes (Signed)
Pt has had worsening intermittent dizziness/shortness of breath for the past 3 weeks. Hx COPD. Grunting in triage to catch breath. States dizziness feels like she is going to pass out.

## 2020-08-29 NOTE — ED Notes (Signed)
Meal tray given to patient and daughter per Dr. Karle Starch. Resting in room. Denies needs at this time. Respirations even and unlabored. NO acute distress noted.

## 2020-08-29 NOTE — ED Provider Notes (Signed)
[facility] EMERGENCY DEPARTMENT Provider Note  CSN: [id number] Arrival date & time: 08/29/20 1947    History Chief Complaint  Patient presents with  . Shortness of Breath  . Dizziness    HPI  Kim Blankenship is a 75 y.o. female with history of asthma/COPD brought to the ED by her daughter who reports they have been evicted from the motel they were living at and have been homeless for the last several days without anything to eat. Daughter reports the patient has had worsening memory problems but no definitive diagnosis. She has had a fall in the last few days with a skin tear on R hand. She has had increased cough and wheezing as well. Previously on Combivent and Symbicort but currently only has an albuterol inhaler which is almost out.    Past Medical History:  Diagnosis Date  . Asthma   . Atrial fibrillation ([place])   . Cancer ([place])    cervical  . COPD (chronic obstructive pulmonary disease) ([place])   . Hypertension   . Irregular heart rate   . PRES (posterior reversible encephalopathy syndrome)   . Stroke [facility])     Past Surgical History:  Procedure Laterality Date  . CESAREAN SECTION    . Left Foot Reconstruction    . OPEN REDUCTION INTERNAL FIXATION (ORIF) METACARPAL Right 03/10/2015   Procedure: OPEN REDUCTION INTERNAL FIXATION (ORIF) RIGHT LONG  METACARPAL FRACTURE;  Surgeon: Leanora Cover, MD;  Location: [facility];  Service: Orthopedics;  Laterality: Right;  . TONSILLECTOMY    . TOTAL HIP ARTHROPLASTY Right 02/09/2016   Procedure: TOTAL HIP ARTHROPLASTY ANTERIOR APPROACH;  Surgeon: Frederik Pear, MD;  Location: [facility];  Service: Orthopedics;  Laterality: Right;    Family History  Problem Relation Age of Onset  . Dementia Father     Social History   Tobacco Use  . Smoking status: Former Smoker    Packs/day: 2.00    Years: 30.00    Pack years: 60.00    Types: Cigarettes    Quit date: 04/1995    Years since quitting: 25.4  .  Smokeless tobacco: Never Used  Vaping Use  . Vaping Use: Never used  Substance Use Topics  . Alcohol use: Yes  . Drug use: No     Home Medications Prior to Admission medications   Medication Sig Start Date End Date Taking? Authorizing Provider  albuterol (VENTOLIN HFA) 108 (90 Base) MCG/ACT inhaler Inhale 2 puffs into the lungs every 4 (four) hours as needed for wheezing or shortness of breath.    [provider]  amLODipine (NORVASC) 2.5 MG tablet Take 2.5 mg by mouth daily.    [provider]  aspirin EC 81 MG tablet Take 81 mg by mouth daily.    [provider]  atorvastatin (LIPITOR) 20 MG tablet Take 20 mg by mouth daily. 04/08/16   [provider]  busPIRone (BUSPAR) 10 MG tablet Take 10 mg by mouth 2 (two) times daily. 01/29/20   [provider]  calcium carbonate (TUMS - DOSED IN MG ELEMENTAL CALCIUM) 500 MG chewable tablet Chew 1 tablet by mouth daily as needed for indigestion or heartburn.    [provider]  ferrous sulfate 325 (65 FE) MG tablet Take 1 tablet (325 mg total) by mouth 2 (two) times daily with a meal. 12/10/18   Hongalgi, Lenis [facility], MD  folic acid (FOLVITE) 1 MG tablet Take 1 tablet (1 mg total) by mouth daily. 12/11/18  Hongalgi, Lenis [place], MD  Ipratropium-Albuterol (COMBIVENT RESPIMAT) 20-100 MCG/ACT AERS respimat Inhale 1 puff into the lungs every 6 (six) hours.    [provider]  metoprolol tartrate (LOPRESSOR) 50 MG tablet Take 50 mg by mouth 2 (two) times daily. 01/29/20   [provider]  Multiple Vitamin (MULTIVITAMIN WITH MINERALS) TABS tablet Take 1 tablet by mouth daily. 12/11/18   Hongalgi, Lenis [facility], MD  OLANZapine (ZYPREXA) 2.5 MG tablet Take 2.5 mg by mouth at bedtime.    [provider]  predniSONE (STERAPRED UNI-PAK 21 TAB) 10 MG (21) TBPK tablet 10mg  Tabs, 6 day taper. Use as directed 08/29/20   Truddie Hidden, MD  QUEtiapine (SEROQUEL) 100 MG tablet Take 200 mg by mouth at  bedtime.     [provider]  spironolactone (ALDACTONE) 25 MG tablet Take 25 mg by mouth daily.    [provider]  thiamine 100 MG tablet Take 1 tablet (100 mg total) by mouth daily. 12/11/18   Hongalgi, Lenis [place], MD  tiZANidine (ZANAFLEX) 2 MG tablet Take 2 mg by mouth at bedtime.    [provider]     Allergies    Levofloxacin and Tape   Review of Systems   Review of Systems A comprehensive review of systems was completed and negative except as noted in HPI.    Physical Exam BP 111/80   Pulse 80   Temp 98.4 F (36.9 C) (Oral)   Resp 19   Ht 5\' 4"  (1.626 m)   Wt 49.9 kg   SpO2 95%   BMI 18.88 kg/m   Physical Exam Vitals and nursing note reviewed.  Constitutional:      Appearance: Normal appearance.  HENT:     Head: Normocephalic and atraumatic.     Nose: Nose normal.     Mouth/Throat:     Mouth: Mucous membranes are moist.  Eyes:     Extraocular Movements: Extraocular movements intact.     Conjunctiva/sclera: Conjunctivae normal.  Cardiovascular:     Rate and Rhythm: Normal rate.  Pulmonary:     Effort: Pulmonary effort is normal.     Breath sounds: Wheezing present.  Abdominal:     General: Abdomen is flat.     Palpations: Abdomen is soft.     Tenderness: There is no abdominal tenderness.  Musculoskeletal:        General: No swelling. Normal range of motion.     Cervical back: Neck supple.  Skin:    General: Skin is warm and dry.     Findings: Ecchymosis (bilateral hands) present.     Comments: Superficial skin tear R hand; small eschar to R medial ankle without signs of infection.   Neurological:     General: No focal deficit present.     Mental Status: She is alert.  Psychiatric:        Mood and Affect: Mood normal.      ED Results / Procedures / Treatments   Labs (all labs ordered are listed, but only abnormal results are displayed) Labs Reviewed - No data to display  EKG EKG Interpretation  Date/Time:  Tuesday  Aug 29 2020 20:00:10 EDT Ventricular Rate:  93 PR Interval:  120 QRS Duration: 96 QT Interval:  368 QTC Calculation: 457 R Axis:   76 Text Interpretation: Sinus rhythm with Premature atrial complexes Nonspecific ST abnormality Abnormal ECG No significant change since last tracing Confirmed by Calvert Cantor [phone]) on 08/29/2020 8:14:28 PM    Radiology  DG Chest 2 View  Result Date: 08/29/2020 CLINICAL DATA:  Shortness of breath EXAM: CHEST - 2 VIEW COMPARISON:  04/25/2020 FINDINGS: The heart size and mediastinal contours are within normal limits. Both lungs are clear. The visualized skeletal structures are unremarkable. IMPRESSION: No active cardiopulmonary disease. Electronically Signed   By: Donavan Foil M.D.   On: 08/29/2020 20:40    Procedures Procedures  Medications Ordered in the ED Medications  Ipratropium-Albuterol (COMBIVENT) respimat 1 puff (1 puff Inhalation Given 08/29/20 2043)  albuterol (VENTOLIN HFA) 108 (90 Base) MCG/ACT inhaler 2 puff (2 puffs Inhalation Given 08/29/20 2131)  predniSONE (DELTASONE) tablet 40 mg (40 mg Oral Given 08/29/20 2038)  albuterol (PROVENTIL) (2.5 MG/3ML) 0.083% nebulizer solution 2.5 mg (2.5 mg Nebulization Given 08/29/20 2131)     MDM Rules/Calculators/A&P MDM Patient here with SOB, no hypoxia. Wheezing on exam. Will give Combivent inhaler and begin prednisone. Patient and daughter given a microwave meal. Advised that there is no Education officer, museum in this facility to help them with their social issues. Unfortunately they also do not have a phone for follow up calls. Will plan resource guide at discharge.  ED Course  I have reviewed the triage vital signs and the nursing notes.  Pertinent labs & imaging results that were available during my care of the patient were reviewed by me and considered in my medical decision making (see chart for details).  Clinical Course as of 08/29/20 2239  Tue Aug 29, 2020  2052 CXR is clear.  [CS]  2218 Patient  with improved wheezing after inhaler and neb. She has borderline SpO2 at rest, transiently dropped to upper 80s, but improved to mid 90s and has stayed there since.  [CS]  2234 SpO2 on earlobe with stats consistently in upper 90s. Suspect some spurious readings in fingers. Daughter at bedside is comfortable with discharge. Rx for prednisone for COPD. Resource guide for their social issues. Recommend RTED for any worsening symptoms or other concerns.  [CS]    Clinical Course User Index [CS] Truddie Hidden, MD    Final Clinical Impression(s) / ED Diagnoses Final diagnoses:  COPD exacerbation ([place])  Skin tear of right hand without complication, initial encounter  Homelessness    Rx / DC Orders ED Discharge Orders         Ordered    predniSONE (STERAPRED UNI-PAK 21 TAB) 10 MG (21) TBPK tablet  Status:  Discontinued        08/29/20 2238    predniSONE (STERAPRED UNI-PAK 21 TAB) 10 MG (21) TBPK tablet        08/29/20 2238           Truddie Hidden, MD 08/29/20 2239

## 2020-08-29 NOTE — Progress Notes (Signed)
Patient ambulated around the department while on pulse ox.  Patients initial SPO2 was between 88 and 90%.  Upon reaching the halfway around the department her SPO2 increased to 95% and HR increased to 92.  Patient's SPO2 was 95% upon returning to the room and HR was 90.  Patient stated that she did not feel short of breath but was cold.

## 2020-08-30 ENCOUNTER — Other Ambulatory Visit: Payer: Self-pay

## 2020-08-30 ENCOUNTER — Emergency Department (HOSPITAL_BASED_OUTPATIENT_CLINIC_OR_DEPARTMENT_OTHER)
Admission: EM | Admit: 2020-08-30 | Discharge: 2020-08-30 | Disposition: A | Discharge status: Home / Self Care | Payer: Medicare Other | Attending: Emergency Medicine | Admitting: Emergency Medicine

## 2020-08-30 DIAGNOSIS — Z79899 Other long term (current) drug therapy: Secondary | ICD-10-CM | POA: Insufficient documentation

## 2020-08-30 DIAGNOSIS — R059 Cough, unspecified: Secondary | ICD-10-CM | POA: Diagnosis not present

## 2020-08-30 DIAGNOSIS — M62838 Other muscle spasm: Secondary | ICD-10-CM | POA: Diagnosis not present

## 2020-08-30 DIAGNOSIS — J449 Chronic obstructive pulmonary disease, unspecified: Secondary | ICD-10-CM | POA: Diagnosis not present

## 2020-08-30 DIAGNOSIS — Z8541 Personal history of malignant neoplasm of cervix uteri: Secondary | ICD-10-CM | POA: Diagnosis not present

## 2020-08-30 DIAGNOSIS — Z87891 Personal history of nicotine dependence: Secondary | ICD-10-CM | POA: Insufficient documentation

## 2020-08-30 DIAGNOSIS — Z7951 Long term (current) use of inhaled steroids: Secondary | ICD-10-CM | POA: Diagnosis not present

## 2020-08-30 DIAGNOSIS — R0602 Shortness of breath: Secondary | ICD-10-CM | POA: Diagnosis present

## 2020-08-30 DIAGNOSIS — I10 Essential (primary) hypertension: Secondary | ICD-10-CM | POA: Diagnosis not present

## 2020-08-30 DIAGNOSIS — J45909 Unspecified asthma, uncomplicated: Secondary | ICD-10-CM | POA: Insufficient documentation

## 2020-08-30 DIAGNOSIS — Z7982 Long term (current) use of aspirin: Secondary | ICD-10-CM | POA: Diagnosis not present

## 2020-08-30 DIAGNOSIS — Z96641 Presence of right artificial hip joint: Secondary | ICD-10-CM | POA: Diagnosis not present

## 2020-08-30 LAB — COMPREHENSIVE METABOLIC PANEL
ALT: 23 U/L (ref 0–44)
AST: 36 U/L (ref 15–41)
Albumin: 4.4 g/dL (ref 3.5–5.0)
Alkaline Phosphatase: 83 U/L (ref 38–126)
Anion gap: 14 (ref 5–15)
BUN: 12 mg/dL (ref 8–23)
CO2: 21 mmol/L — ABNORMAL LOW (ref 22–32)
Calcium: 9.2 mg/dL (ref 8.9–10.3)
Chloride: 99 mmol/L (ref 98–111)
Creatinine, Ser: 0.89 mg/dL (ref 0.44–1.00)
GFR, Estimated: >60 mL/min (ref >60)
Glucose, Bld: 140 mg/dL — ABNORMAL HIGH (ref 70–99)
Potassium: 3.8 mmol/L (ref 3.5–5.1)
Sodium: 134 mmol/L — ABNORMAL LOW (ref 135–145)
Total Bilirubin: 1.1 mg/dL (ref 0.3–1.2)
Total Protein: 7.2 g/dL (ref 6.5–8.1)

## 2020-08-30 LAB — CBC WITH DIFFERENTIAL/PLATELET
Abs Immature Granulocytes: 0.01 10*3/uL (ref 0.00–0.07)
Basophils Absolute: 0 10*3/uL (ref 0.0–0.1)
Basophils Relative: 0 %
Eosinophils Absolute: 0 10*3/uL (ref 0.0–0.5)
Eosinophils Relative: 0 %
HCT: 38.7 % (ref 36.0–46.0)
Hemoglobin: 13.7 g/dL (ref 12.0–15.0)
Immature Granulocytes: 0 %
Lymphocytes Relative: 12 %
Lymphs Abs: 0.5 10*3/uL — ABNORMAL LOW (ref 0.7–4.0)
MCH: 32.3 pg (ref 26.0–34.0)
MCHC: 35.4 g/dL (ref 30.0–36.0)
MCV: 91.3 fL (ref 80.0–100.0)
Monocytes Absolute: 0 10*3/uL — ABNORMAL LOW (ref 0.1–1.0)
Monocytes Relative: 1 %
Neutro Abs: 3.8 10*3/uL (ref 1.7–7.7)
Neutrophils Relative %: 87 %
Platelets: 294 10*3/uL (ref 150–400)
RBC: 4.24 MIL/uL (ref 3.87–5.11)
RDW: 12.1 % (ref 11.5–15.5)
WBC: 4.4 10*3/uL (ref 4.0–10.5)
nRBC: 0 % (ref 0.0–0.2)

## 2020-08-30 LAB — I-STAT VENOUS BLOOD GAS, ED
Acid-base deficit: 3 mmol/L — ABNORMAL HIGH (ref 0.0–2.0)
Bicarbonate: 21.8 mmol/L (ref 20.0–28.0)
Calcium, Ion: 1.22 mmol/L (ref 1.15–1.40)
HCT: 38 % (ref 36.0–46.0)
Hemoglobin: 12.9 g/dL (ref 12.0–15.0)
O2 Saturation: 71 %
Patient temperature: 98.5
Potassium: 3.8 mmol/L (ref 3.5–5.1)
Sodium: 136 mmol/L (ref 135–145)
TCO2: 23 mmol/L (ref 22–32)
pCO2, Ven: 38.7 mmHg — ABNORMAL LOW (ref 44.0–60.0)
pH, Ven: 7.359 (ref 7.250–7.430)
pO2, Ven: 38 mmHg (ref 32.0–45.0)

## 2020-08-30 LAB — LIPASE, BLOOD: Lipase: 30 U/L (ref 11–51)

## 2020-08-30 LAB — TROPONIN I (HIGH SENSITIVITY): Troponin I (High Sensitivity): 5 ng/L (ref <18)

## 2020-08-30 NOTE — ED Provider Notes (Signed)
[facility] Provider Note: Georgena Spurling, MD, FACEP  CSN: [id number] MRN: [id number] ARRIVAL: 08/30/20 at [place]: [place]  08/30/20 3:55 AM Kim Blankenship is a 75 y.o. female who was seen in this ED yesterday evening.  She was brought in by her daughter who reported that they have been evicted from the motel they were living in and of been homeless and without food for several days.  The daughter states the patient has had worsening memory problems but has had no formal diagnosis of dementia.  She has had increased cough and wheezing recently as well.  She was treated with albuterol/ipratropium nebulizer and inhaler treatments and started on prednisone and discharged with a Combivent inhaler.  She also had a skin tear on her right hand bandaged.  Nursing staff reports she did not get her prednisone filled.  She returns stating her shortness of breath has not improved.  She still has moderate dyspnea worse with exertion or lying flat.  She is now having what she describes as muscle spasms in her epigastrium and/or her lower sternal region.  She rates these as an 8 out of 10.  Past Medical History:  Diagnosis Date  . Asthma   . Atrial fibrillation ([place])   . Cancer ([place])    cervical  . COPD (chronic obstructive pulmonary disease) ([place])   . Hypertension   . Irregular heart rate   . PRES (posterior reversible encephalopathy syndrome)   . Stroke [facility])     Past Surgical History:  Procedure Laterality Date  . CESAREAN SECTION    . Left Foot Reconstruction    . OPEN REDUCTION INTERNAL FIXATION (ORIF) METACARPAL Right 03/10/2015   Procedure: OPEN REDUCTION INTERNAL FIXATION (ORIF) RIGHT LONG  METACARPAL FRACTURE;  Surgeon: Leanora Cover, MD;  Location: [facility];  Service: Orthopedics;  Laterality: Right;  . TONSILLECTOMY    . TOTAL HIP ARTHROPLASTY Right 02/09/2016   Procedure: TOTAL  HIP ARTHROPLASTY ANTERIOR APPROACH;  Surgeon: Frederik Pear, MD;  Location[facility];  Service: Orthopedics;  Laterality: Right;    Family History  Problem Relation Age of Onset  . Dementia Father     Social History   Tobacco Use  . Smoking status: Former Smoker    Packs/day: 2.00    Years: 30.00    Pack years: 60.00    Types: Cigarettes    Quit date: 04/1995    Years since quitting: 25.4  . Smokeless tobacco: Never Used  Vaping Use  . Vaping Use: Never used  Substance Use Topics  . Alcohol use: Yes  . Drug use: No    Prior to Admission medications   Medication Sig Start Date End Date Taking? Authorizing Provider  albuterol (VENTOLIN HFA) 108 (90 Base) MCG/ACT inhaler Inhale 2 puffs into the lungs every 4 (four) hours as needed for wheezing or shortness of breath.    [provider]  amLODipine (NORVASC) 2.5 MG tablet Take 2.5 mg by mouth daily.    [provider]  aspirin EC 81 MG tablet Take 81 mg by mouth daily.    [provider]  atorvastatin (LIPITOR) 20 MG tablet Take 20 mg by mouth daily. 04/08/16   [provider]  busPIRone (BUSPAR) 10 MG tablet Take 10 mg by mouth 2 (two) times daily. 01/29/20   [provider]  calcium carbonate (TUMS - DOSED IN MG ELEMENTAL CALCIUM) 500  MG chewable tablet Chew 1 tablet by mouth daily as needed for indigestion or heartburn.    [provider]  ferrous sulfate 325 (65 FE) MG tablet Take 1 tablet (325 mg total) by mouth 2 (two) times daily with a meal. 12/10/18   Hongalgi, Lenis Dickinson, MD  folic acid (FOLVITE) 1 MG tablet Take 1 tablet (1 mg total) by mouth daily. 12/11/18   Hongalgi, Lenis Dickinson, MD  Ipratropium-Albuterol (COMBIVENT RESPIMAT) 20-100 MCG/ACT AERS respimat Inhale 1 puff into the lungs every 6 (six) hours.    [provider]  metoprolol tartrate (LOPRESSOR) 50 MG tablet Take 50 mg by mouth 2 (two) times daily. 01/29/20   [provider]  Multiple Vitamin  (MULTIVITAMIN WITH MINERALS) TABS tablet Take 1 tablet by mouth daily. 12/11/18   Hongalgi, Lenis Dickinson, MD  OLANZapine (ZYPREXA) 2.5 MG tablet Take 2.5 mg by mouth at bedtime.    [provider]  predniSONE (STERAPRED UNI-PAK 21 TAB) 10 MG (21) TBPK tablet 10mg  Tabs, 6 day taper. Use as directed 08/29/20   Truddie Hidden, MD  QUEtiapine (SEROQUEL) 100 MG tablet Take 200 mg by mouth at bedtime.     [provider]  spironolactone (ALDACTONE) 25 MG tablet Take 25 mg by mouth daily.    [provider]  thiamine 100 MG tablet Take 1 tablet (100 mg total) by mouth daily. 12/11/18   Hongalgi, Lenis Dickinson, MD  tiZANidine (ZANAFLEX) 2 MG tablet Take 2 mg by mouth at bedtime.    [provider]    Allergies Levofloxacin and Tape   REVIEW OF SYSTEMS  Negative except as noted here or in the History of Present Illness.   PHYSICAL EXAMINATION  Initial Vital Signs Blood pressure (!) 173/93, pulse 85, resp. rate (!) 28, height 5\' 4"  (1.626 m), weight 49.9 kg, SpO2 100 %.  Examination General: Well-developed, well-nourished female in no acute distress; appears older than age of record HENT: normocephalic; atraumatic Eyes: pupils equal, round and reactive to light; extraocular muscles intact Neck: supple Heart: regular rate and rhythm Lungs: No wheezing; tachypnea; transmitted upper airway expiratory grunting Abdomen: soft; nondistended; nontender;bowel sounds present Extremities: No deformity; full range of motion; pulses normal Neurologic: Awake, alert and oriented; motor function intact in all extremities and symmetric; no facial droop Skin: Warm and dry; bandage on right hand Psychiatric: Normal mood and affect   RESULTS  Summary of this visit's results, reviewed and interpreted by myself:   EKG Interpretation  Date/Time:  Wednesday August 30 2020 04:15:36 EDT Ventricular Rate:  69 PR Interval:  124 QRS Duration: 97 QT Interval:  431 QTC Calculation: 462 R  Axis:   88 Text Interpretation: Sinus rhythm Borderline right axis deviation ECG OTHERWISE WITHIN NORMAL LIMITS Confirmed by Shanon Rosser [phone]) on 08/30/2020 4:18:30 AM      Laboratory Studies: Results for orders placed or performed during the hospital encounter of 08/30/20 (from the past 24 hour(s))  CBC with Differential/Platelet     Status: Abnormal   Collection Time: 08/30/20  4:17 AM  Result Value Ref Range   WBC 4.4 4.0 - 10.5 K/uL   RBC 4.24 3.87 - 5.11 MIL/uL   Hemoglobin 13.7 12.0 - 15.0 g/dL   HCT 38.7 36.0 - 46.0 %   MCV 91.3 80.0 - 100.0 fL   MCH 32.3 26.0 - 34.0 pg   MCHC 35.4 30.0 - 36.0 g/dL   RDW 12.1 11.5 - 15.5 %   Platelets 294 150 - 400  K/uL   nRBC 0.0 0.0 - 0.2 %   Neutrophils Relative % 87 %   Neutro Abs 3.8 1.7 - 7.7 K/uL   Lymphocytes Relative 12 %   Lymphs Abs 0.5 (L) 0.7 - 4.0 K/uL   Monocytes Relative 1 %   Monocytes Absolute 0.0 (L) 0.1 - 1.0 K/uL   Eosinophils Relative 0 %   Eosinophils Absolute 0.0 0.0 - 0.5 K/uL   Basophils Relative 0 %   Basophils Absolute 0.0 0.0 - 0.1 K/uL   Immature Granulocytes 0 %   Abs Immature Granulocytes 0.01 0.00 - 0.07 K/uL  Troponin I (High Sensitivity)     Status: None   Collection Time: 08/30/20  4:17 AM  Result Value Ref Range   Troponin I (High Sensitivity) 5 <18 ng/L  Comprehensive metabolic panel     Status: Abnormal   Collection Time: 08/30/20  4:17 AM  Result Value Ref Range   Sodium 134 (L) 135 - 145 mmol/L   Potassium 3.8 3.5 - 5.1 mmol/L   Chloride 99 98 - 111 mmol/L   CO2 21 (L) 22 - 32 mmol/L   Glucose, Bld 140 (H) 70 - 99 mg/dL   BUN 12 8 - 23 mg/dL   Creatinine, Ser 0.89 0.44 - 1.00 mg/dL   Calcium 9.2 8.9 - 10.3 mg/dL   Total Protein 7.2 6.5 - 8.1 g/dL   Albumin 4.4 3.5 - 5.0 g/dL   AST 36 15 - 41 U/L   ALT 23 0 - 44 U/L   Alkaline Phosphatase 83 38 - 126 U/L   Total Bilirubin 1.1 0.3 - 1.2 mg/dL   GFR, Estimated >60 >60 mL/min   Anion gap 14 5 - 15  Lipase, blood     Status: None    Collection Time: 08/30/20  4:17 AM  Result Value Ref Range   Lipase 30 11 - 51 U/L  I-Stat venous blood gas, ([facility])     Status: Abnormal   Collection Time: 08/30/20  4:34 AM  Result Value Ref Range   pH, Ven 7.359 7.250 - 7.430   pCO2, Ven 38.7 (L) 44.0 - 60.0 mmHg   pO2, Ven 38.0 32.0 - 45.0 mmHg   Bicarbonate 21.8 20.0 - 28.0 mmol/L   TCO2 23 22 - 32 mmol/L   O2 Saturation 71.0 %   Acid-base deficit 3.0 (H) 0.0 - 2.0 mmol/L   Sodium 136 135 - 145 mmol/L   Potassium 3.8 3.5 - 5.1 mmol/L   Calcium, Ion 1.22 1.15 - 1.40 mmol/L   HCT 38.0 36.0 - 46.0 %   Hemoglobin 12.9 12.0 - 15.0 g/dL   Patient temperature 98.5 F    Collection site IV start    Drawn by Nurse    Sample type VENOUS    Comment NOTIFIED PHYSICIAN    Imaging Studies: DG Chest 2 View  Result Date: 08/29/2020 CLINICAL DATA:  Shortness of breath EXAM: CHEST - 2 VIEW COMPARISON:  04/25/2020 FINDINGS: The heart size and mediastinal contours are within normal limits. Both lungs are clear. The visualized skeletal structures are unremarkable. IMPRESSION: No active cardiopulmonary disease. Electronically Signed   By: Donavan Foil M.D.   On: 08/29/2020 20:40    ED COURSE and MDM  Nursing notes, initial and subsequent vitals signs, including pulse oximetry, reviewed and interpreted by myself.  Vitals:   08/30/20 0350 08/30/20 0415 08/30/20 0418 08/30/20 0500  BP:  (!) 145/72  (!) 144/68  Pulse:  78  80  Resp:  Marland Kitchen)  22  16  Temp:   98.5 F (36.9 C) 98.5 F (36.9 C)  TempSrc:   Oral Oral  SpO2:  97%  95%  Weight: 49.9 kg     Height: 5\' 4"  (1.626 m)      Medications - No data to display  4:08 AM Patient now sleeping.  Grunting no longer present present, tachypnea has resolved, respiratory rate is 17 with an oxygen saturation of 95% on room air.   5:08 AM Patient and daughter advised of reassuring work-up.  Patient getting adequate oxygenation on room air and is not retaining CO2.  EKG is normal, troponin is normal  and chest x-ray is not showing any acute abnormalities.  The daughter has also observed the patient's symptoms are improved when she is sleeping.  She may be getting sensation of dyspnea from the lung receptors that are being irritated by, perhaps, environmental factors but her lab work and vital signs show no evidence of respiratory failure.  Daughter acknowledged understanding of this.  She was advised to continue using the inhaler as instructed.  PROCEDURES  Procedures   ED DIAGNOSES     ICD-10-CM   1. Shortness of breath  R06.02        Angelis Gates, Jenny Reichmann, MD 08/30/20 [phone]

## 2020-08-30 NOTE — ED Triage Notes (Signed)
Pt states no improvement of SOB. Seen earlier today. HX CPOD. C/o new onset of abdominal cramping.

## 2020-09-30 ENCOUNTER — Emergency Department (HOSPITAL_COMMUNITY): Payer: Medicare Other

## 2020-09-30 ENCOUNTER — Other Ambulatory Visit: Payer: Self-pay

## 2020-09-30 ENCOUNTER — Encounter (HOSPITAL_COMMUNITY): Payer: Self-pay | Admitting: Emergency Medicine

## 2020-09-30 ENCOUNTER — Emergency Department (HOSPITAL_COMMUNITY)
Admission: EM | Admit: 2020-09-30 | Discharge: 2020-09-30 | Disposition: A | Discharge status: Home / Self Care | Payer: Medicare Other | Attending: Emergency Medicine | Admitting: Emergency Medicine

## 2020-09-30 DIAGNOSIS — Z7951 Long term (current) use of inhaled steroids: Secondary | ICD-10-CM | POA: Insufficient documentation

## 2020-09-30 DIAGNOSIS — Z79899 Other long term (current) drug therapy: Secondary | ICD-10-CM | POA: Diagnosis not present

## 2020-09-30 DIAGNOSIS — J441 Chronic obstructive pulmonary disease with (acute) exacerbation: Secondary | ICD-10-CM

## 2020-09-30 DIAGNOSIS — Z7982 Long term (current) use of aspirin: Secondary | ICD-10-CM | POA: Insufficient documentation

## 2020-09-30 DIAGNOSIS — Z20822 Contact with and (suspected) exposure to covid-19: Secondary | ICD-10-CM | POA: Diagnosis not present

## 2020-09-30 DIAGNOSIS — Z96641 Presence of right artificial hip joint: Secondary | ICD-10-CM | POA: Diagnosis not present

## 2020-09-30 DIAGNOSIS — R0602 Shortness of breath: Secondary | ICD-10-CM | POA: Diagnosis present

## 2020-09-30 DIAGNOSIS — Z87891 Personal history of nicotine dependence: Secondary | ICD-10-CM | POA: Insufficient documentation

## 2020-09-30 DIAGNOSIS — I110 Hypertensive heart disease with heart failure: Secondary | ICD-10-CM | POA: Insufficient documentation

## 2020-09-30 DIAGNOSIS — I5032 Chronic diastolic (congestive) heart failure: Secondary | ICD-10-CM | POA: Insufficient documentation

## 2020-09-30 DIAGNOSIS — J209 Acute bronchitis, unspecified: Secondary | ICD-10-CM

## 2020-09-30 DIAGNOSIS — Z8544 Personal history of malignant neoplasm of other female genital organs: Secondary | ICD-10-CM | POA: Diagnosis not present

## 2020-09-30 DIAGNOSIS — Z85828 Personal history of other malignant neoplasm of skin: Secondary | ICD-10-CM | POA: Insufficient documentation

## 2020-09-30 DIAGNOSIS — J45909 Unspecified asthma, uncomplicated: Secondary | ICD-10-CM | POA: Insufficient documentation

## 2020-09-30 LAB — CBC
HCT: 40.8 % (ref 36.0–46.0)
Hemoglobin: 14.3 g/dL (ref 12.0–15.0)
MCH: 31.9 pg (ref 26.0–34.0)
MCHC: 35 g/dL (ref 30.0–36.0)
MCV: 91.1 fL (ref 80.0–100.0)
Platelets: 299 10*3/uL (ref 150–400)
RBC: 4.48 MIL/uL (ref 3.87–5.11)
RDW: 12.3 % (ref 11.5–15.5)
WBC: 8 10*3/uL (ref 4.0–10.5)
nRBC: 0 % (ref 0.0–0.2)

## 2020-09-30 LAB — TROPONIN I (HIGH SENSITIVITY)
Troponin I (High Sensitivity): 7 ng/L (ref <18)
Troponin I (High Sensitivity): 9 ng/L (ref <18)

## 2020-09-30 LAB — RESP PANEL BY RT-PCR (FLU A&B, COVID) ARPGX2
Influenza A by PCR: NEGATIVE
Influenza B by PCR: NEGATIVE
SARS Coronavirus 2 by RT PCR: NEGATIVE

## 2020-09-30 LAB — BASIC METABOLIC PANEL
Anion gap: 7 (ref 5–15)
BUN: 5 mg/dL — ABNORMAL LOW (ref 8–23)
CO2: 27 mmol/L (ref 22–32)
Calcium: 9.3 mg/dL (ref 8.9–10.3)
Chloride: 97 mmol/L — ABNORMAL LOW (ref 98–111)
Creatinine, Ser: 0.63 mg/dL (ref 0.44–1.00)
GFR, Estimated: >60 mL/min (ref >60)
Glucose, Bld: 114 mg/dL — ABNORMAL HIGH (ref 70–99)
Potassium: 3.8 mmol/L (ref 3.5–5.1)
Sodium: 131 mmol/L — ABNORMAL LOW (ref 135–145)

## 2020-09-30 MED ORDER — ALBUTEROL SULFATE HFA 108 (90 BASE) MCG/ACT IN AERS
2.0000 | INHALATION_SPRAY | Freq: Once | RESPIRATORY_TRACT | Status: AC
  Administered 2020-09-30: 2 via RESPIRATORY_TRACT
  Filled 2020-09-30: qty 6.7

## 2020-09-30 MED ORDER — PREDNISONE 10 MG (21) PO TBPK: ORAL_TABLET | Freq: Every day | ORAL | 0 refills | Status: DC

## 2020-09-30 MED ORDER — AEROCHAMBER PLUS FLO-VU LARGE MISC
Status: AC
  Filled 2020-09-30: qty 1

## 2020-09-30 MED ORDER — HYDROCOD POLST-CPM POLST ER 10-8 MG/5ML PO SUER: 5.0000 mL | Freq: Two times a day (BID) | ORAL | 0 refills | Status: DC | PRN

## 2020-09-30 MED ORDER — IPRATROPIUM-ALBUTEROL 0.5-2.5 (3) MG/3ML IN SOLN
3.0000 mL | Freq: Once | RESPIRATORY_TRACT | Status: AC
  Administered 2020-09-30: 3 mL via RESPIRATORY_TRACT
  Filled 2020-09-30: qty 3

## 2020-09-30 MED ORDER — DOXYCYCLINE HYCLATE 100 MG PO TABS
100.0000 mg | ORAL_TABLET | Freq: Once | ORAL | Status: AC
  Administered 2020-09-30: 100 mg via ORAL
  Filled 2020-09-30: qty 1

## 2020-09-30 MED ORDER — AEROCHAMBER PLUS FLO-VU LARGE MISC: Freq: Once | Status: AC

## 2020-09-30 MED ORDER — METHYLPREDNISOLONE SODIUM SUCC 125 MG IJ SOLR
125.0000 mg | Freq: Once | INTRAMUSCULAR | Status: AC
  Administered 2020-09-30: 125 mg via INTRAVENOUS
  Filled 2020-09-30: qty 2

## 2020-09-30 MED ORDER — COMBIVENT RESPIMAT 20-100 MCG/ACT IN AERS: 1.0000 | INHALATION_SPRAY | Freq: Four times a day (QID) | RESPIRATORY_TRACT | 0 refills | Status: DC

## 2020-09-30 MED ORDER — HYDROCOD POLST-CPM POLST ER 10-8 MG/5ML PO SUER
5.0000 mL | Freq: Once | ORAL | Status: AC
  Administered 2020-09-30: 5 mL via ORAL
  Filled 2020-09-30: qty 5

## 2020-09-30 MED ORDER — DOXYCYCLINE HYCLATE 100 MG PO CAPS: 100.0000 mg | ORAL_CAPSULE | Freq: Two times a day (BID) | ORAL | 0 refills | Status: DC

## 2020-09-30 MED ORDER — ALBUTEROL SULFATE HFA 108 (90 BASE) MCG/ACT IN AERS: 2.0000 | INHALATION_SPRAY | RESPIRATORY_TRACT | 0 refills | Status: AC | PRN

## 2020-09-30 NOTE — ED Provider Notes (Signed)
[facility] EMERGENCY DEPARTMENT Provider Note   CSN: [id number] Arrival date & time: 09/30/20  1000     History No chief complaint on file.   Kim Blankenship is a 75 y.o. female.  Pt presents to the ED today with sob.  The pt has a hx of COPD and has been using her inhalers.  She said she had a negative covid test 2 days ago.  The pt said she has not had a fever, but has had sinus pain and headache.     Past Medical History:  Diagnosis Date   Asthma    Atrial fibrillation ([place])    Cancer ([place])    cervical   COPD (chronic obstructive pulmonary disease) ([place])    Hypertension    Irregular heart rate    PRES (posterior reversible encephalopathy syndrome)    Stroke [facility])     Patient Active Problem List   Diagnosis Date Noted   Acute metabolic encephalopathy 41/93/7902   Acute respiratory failure with hypoxia ([place]) 07/23/2019   Acute encephalopathy 07/22/2019   Overdose opiate, accidental or unintentional, initial encounter ([place]) 07/22/2019   Vulvar cancer ([place]) 07/22/2019   Delirium    GIB (gastrointestinal bleeding) 12/04/2018   Chronic diastolic CHF (congestive heart failure) ([place]) 12/04/2018   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   Stroke (cerebrum) ([place]) 11/21/2016   Hyponatremia 11/20/2016   UTI (urinary tract infection) 11/20/2016   HLD (hyperlipidemia) 11/20/2016   GERD (gastroesophageal reflux disease) 11/20/2016   Depression 11/20/2016   Hypertensive emergency 11/04/2016   EtOH dependence ([place]) 11/04/2016   Hyperglycemia 11/04/2016   Essential hypertension    Hypertensive urgency 10/30/2016   PAF (paroxysmal atrial fibrillation) ([place]) 10/30/2016   PRES (posterior reversible encephalopathy syndrome) 10/30/2016   Seizure ([place])    Acute blood loss anemia    Closed fracture of right hip ([place]) 02/09/2016   Chest pain 11/07/2015   COPD (chronic obstructive pulmonary disease) ([place]) 08/19/2013    Abnormal EKG 08/19/2013   Fever, unspecified 08/19/2013    Past Surgical History:  Procedure Laterality Date   CESAREAN SECTION     Left Foot Reconstruction     OPEN REDUCTION INTERNAL FIXATION (ORIF) METACARPAL Right 03/10/2015   Procedure: OPEN REDUCTION INTERNAL FIXATION (ORIF) RIGHT LONG  METACARPAL FRACTURE;  Surgeon: Leanora Cover, MD;  Location: [facility];  Service: Orthopedics;  Laterality: Right;   TONSILLECTOMY     TOTAL HIP ARTHROPLASTY Right 02/09/2016   Procedure: TOTAL HIP ARTHROPLASTY ANTERIOR APPROACH;  Surgeon: Frederik Pear, MD;  [facility];  Service: Orthopedics;  Laterality: Right;     OB History     Gravida      Para      Term      Preterm      AB      Living  1      SAB      IAB      Ectopic      Multiple      Live Births              Family History  Problem Relation Age of Onset   Dementia Father     Social History   Tobacco Use   Smoking status: Former    Packs/day: 2.00    Years: 30.00    Pack years: 60.00    Types: Cigarettes    Quit date: 04/1995    Years since quitting: 25.5  Smokeless tobacco: Never  Vaping Use   Vaping Use: Never used  Substance Use Topics   Alcohol use: Yes   Drug use: No    Home Medications Prior to Admission medications   Medication Sig Start Date End Date Taking? Authorizing Provider  chlorpheniramine-HYDROcodone (TUSSIONEX PENNKINETIC ER) 10-8 MG/5ML SUER Take 5 mLs by mouth every 12 (twelve) hours as needed for cough. 09/30/20  Yes Isla Pence, MD  doxycycline (VIBRAMYCIN) 100 MG capsule Take 1 capsule (100 mg total) by mouth 2 (two) times daily. 09/30/20  Yes Isla Pence, MD  predniSONE (STERAPRED UNI-PAK 21 TAB) 10 MG (21) TBPK tablet Take by mouth daily. Take 6 tabs by mouth daily  for 2 days, then 5 tabs for 2 days, then 4 tabs for 2 days, then 3 tabs for 2 days, 2 tabs for 2 days, then 1 tab by mouth daily for 2 days 09/30/20  Yes Isla Pence, MD  albuterol  (VENTOLIN HFA) 108 (90 Base) MCG/ACT inhaler Inhale 2 puffs into the lungs every 4 (four) hours as needed for wheezing or shortness of breath. 09/30/20   Isla Pence, MD  amLODipine (NORVASC) 2.5 MG tablet Take 2.5 mg by mouth daily.    [provider]  aspirin EC 81 MG tablet Take 81 mg by mouth daily.    [provider]  atorvastatin (LIPITOR) 20 MG tablet Take 20 mg by mouth daily. 04/08/16   [provider]  busPIRone (BUSPAR) 10 MG tablet Take 10 mg by mouth 2 (two) times daily. 01/29/20   [provider]  calcium carbonate (TUMS - DOSED IN MG ELEMENTAL CALCIUM) 500 MG chewable tablet Chew 1 tablet by mouth daily as needed for indigestion or heartburn.    [provider]  ferrous sulfate 325 (65 FE) MG tablet Take 1 tablet (325 mg total) by mouth 2 (two) times daily with a meal. 12/10/18   Hongalgi, Lenis Dickinson, MD  folic acid (FOLVITE) 1 MG tablet Take 1 tablet (1 mg total) by mouth daily. 12/11/18   Hongalgi, Lenis Dickinson, MD  Ipratropium-Albuterol (COMBIVENT RESPIMAT) 20-100 MCG/ACT AERS respimat Inhale 1 puff into the lungs every 6 (six) hours. 09/30/20   Isla Pence, MD  metoprolol tartrate (LOPRESSOR) 50 MG tablet Take 50 mg by mouth 2 (two) times daily. 01/29/20   [provider]  Multiple Vitamin (MULTIVITAMIN WITH MINERALS) TABS tablet Take 1 tablet by mouth daily. 12/11/18   Hongalgi, Lenis Dickinson, MD  OLANZapine (ZYPREXA) 2.5 MG tablet Take 2.5 mg by mouth at bedtime.    [provider]  QUEtiapine (SEROQUEL) 100 MG tablet Take 200 mg by mouth at bedtime.     [provider]  spironolactone (ALDACTONE) 25 MG tablet Take 25 mg by mouth daily.    [provider]  thiamine 100 MG tablet Take 1 tablet (100 mg total) by mouth daily. 12/11/18   Hongalgi, Lenis Dickinson, MD  tiZANidine (ZANAFLEX) 2 MG tablet Take 2 mg by mouth at bedtime.    [provider]    Allergies    Levofloxacin and Tape  Review of Systems    Review of Systems  HENT:  Positive for sinus pain.   Respiratory:  Positive for cough and shortness of breath.   All other systems reviewed and are negative.  Physical Exam Updated Vital Signs BP (!) 168/110   Pulse 80   Temp 98.4 F (36.9 C) (Oral)   Resp (!) 22   SpO2 97%   Physical Exam Vitals  and nursing note reviewed.  Constitutional:      Appearance: Normal appearance.  HENT:     Head: Normocephalic and atraumatic.     Right Ear: External ear normal.     Left Ear: External ear normal.     Nose: Nose normal.     Mouth/Throat:     Mouth: Mucous membranes are moist.     Pharynx: Oropharynx is clear.  Eyes:     Extraocular Movements: Extraocular movements intact.     Conjunctiva/sclera: Conjunctivae normal.     Pupils: Pupils are equal, round, and reactive to light.  Cardiovascular:     Rate and Rhythm: Normal rate and regular rhythm.     Pulses: Normal pulses.     Heart sounds: Normal heart sounds.  Pulmonary:     Breath sounds: Wheezing present.  Abdominal:     General: Abdomen is flat. Bowel sounds are normal.     Palpations: Abdomen is soft.  Musculoskeletal:        General: Normal range of motion.     Cervical back: Normal range of motion and neck supple.  Skin:    General: Skin is warm.     Capillary Refill: Capillary refill takes less than 2 seconds.  Neurological:     General: No focal deficit present.     Mental Status: She is alert and oriented to person, place, and time.  Psychiatric:        Mood and Affect: Mood normal.        Behavior: Behavior normal.        Thought Content: Thought content normal.        Judgment: Judgment normal.    ED Results / Procedures / Treatments   Labs (all labs ordered are listed, but only abnormal results are displayed) Labs Reviewed  BASIC METABOLIC PANEL - Abnormal; Notable for the following components:      Result Value   Sodium 131 (*)    Chloride 97 (*)    Glucose, Bld 114 (*)    BUN 5 (*)    All other  components within normal limits  RESP PANEL BY RT-PCR (FLU A&B, COVID) ARPGX2  CBC  TROPONIN I (HIGH SENSITIVITY)  TROPONIN I (HIGH SENSITIVITY)    EKG None  Radiology DG Chest 2 View  Result Date: 09/30/2020 CLINICAL DATA:  SHORTNESS OF BREATH. HEADACHE PAIN 5 DAYS HISTORY OF COPD. EXAM: CHEST - 2 VIEW COMPARISON:  08/29/2020 FINDINGS: Lungs are mildly hyperinflated. Heart size is normal. There is a small LEFT pleural effusion. No focal consolidations or pulmonary edema. Numerous chronic thoracic wedge compression fractures. IMPRESSION: Small LEFT effusion. Electronically Signed   By: Nolon Nations M.D.   On: 09/30/2020 10:50    Procedures Procedures   Medications Ordered in ED Medications  albuterol (VENTOLIN HFA) 108 (90 Base) MCG/ACT inhaler 2 puff (has no administration in time range)  aerochamber Z-Stat Plus/medium 1 each (has no administration in time range)  doxycycline (VIBRA-TABS) tablet 100 mg (has no administration in time range)  methylPREDNISolone sodium succinate (SOLU-MEDROL) 125 mg/2 mL injection 125 mg (125 mg Intravenous Given 09/30/20 1449)  ipratropium-albuterol (DUONEB) 0.5-2.5 (3) MG/3ML nebulizer solution 3 mL (3 mLs Nebulization Given 09/30/20 1450)  chlorpheniramine-HYDROcodone (TUSSIONEX) 10-8 MG/5ML suspension 5 mL (5 mLs Oral Given 09/30/20 1449)    ED Course  I have reviewed the triage vital signs and the nursing notes.  Pertinent labs & imaging results that were available during my care of the patient were reviewed  by me and considered in my medical decision making (see chart for details).    MDM Rules/Calculators/A&P                         Pt is feeling much better, but is still sob.  She said this is her baseline.  She is offered admission, but wants to go home.  The pt knows that she can always come back if worse.  Covid negative.   Kim Blankenship was evaluated in Emergency Department on 09/30/2020 for the symptoms described in the history of  present illness. She was evaluated in the context of the global COVID-19 pandemic, which necessitated consideration that the patient might be at risk for infection with the SARS-CoV-2 virus that causes COVID-19. Institutional protocols and algorithms that pertain to the evaluation of patients at risk for COVID-19 are in a state of rapid change based on information released by regulatory bodies including the CDC and federal and state organizations. These policies and algorithms were followed during the patient's care in the ED.  Final Clinical Impression(s) / ED Diagnoses Final diagnoses:  COPD with acute exacerbation ([place])  Acute bronchitis, unspecified organism    Rx / DC Orders ED Discharge Orders          Ordered    predniSONE (STERAPRED UNI-PAK 21 TAB) 10 MG (21) TBPK tablet  Daily        09/30/20 1530    chlorpheniramine-HYDROcodone (TUSSIONEX PENNKINETIC ER) 10-8 MG/5ML SUER  Every 12 hours PRN        09/30/20 1530    doxycycline (VIBRAMYCIN) 100 MG capsule  2 times daily        09/30/20 1530    Ipratropium-Albuterol (COMBIVENT RESPIMAT) 20-100 MCG/ACT AERS respimat  Every 6 hours        09/30/20 1530    albuterol (VENTOLIN HFA) 108 (90 Base) MCG/ACT inhaler  Every 4 hours PRN        09/30/20 1530             Isla Pence, MD 09/30/20 1534

## 2020-09-30 NOTE — ED Notes (Signed)
Pt yelling out for nurse, stating she is having a harder time breathing. Increased accessory muscle use noted. MD notified.

## 2020-09-30 NOTE — ED Triage Notes (Signed)
Pt reports SOB, headache, and sinus pain x 5 days.  History of COPD.  Reports negative COVID test 2 days ago.

## 2020-09-30 NOTE — ED Notes (Signed)
Pt ambulated to BR with steady gait. Tolerated well.

## 2020-10-11 ENCOUNTER — Encounter (HOSPITAL_COMMUNITY): Payer: Self-pay

## 2020-10-11 ENCOUNTER — Other Ambulatory Visit: Payer: Self-pay

## 2020-10-11 ENCOUNTER — Emergency Department (HOSPITAL_COMMUNITY): Payer: Medicare Other

## 2020-10-11 ENCOUNTER — Observation Stay (HOSPITAL_COMMUNITY)
Admission: EM | Admit: 2020-10-11 | Discharge: 2020-10-12 | Disposition: A | Discharge status: Home / Self Care | Payer: Medicare Other | Attending: Internal Medicine | Admitting: Internal Medicine

## 2020-10-11 DIAGNOSIS — Z20822 Contact with and (suspected) exposure to covid-19: Secondary | ICD-10-CM | POA: Insufficient documentation

## 2020-10-11 DIAGNOSIS — Z8673 Personal history of transient ischemic attack (TIA), and cerebral infarction without residual deficits: Secondary | ICD-10-CM

## 2020-10-11 DIAGNOSIS — J441 Chronic obstructive pulmonary disease with (acute) exacerbation: Secondary | ICD-10-CM | POA: Diagnosis not present

## 2020-10-11 DIAGNOSIS — Z79899 Other long term (current) drug therapy: Secondary | ICD-10-CM | POA: Diagnosis not present

## 2020-10-11 DIAGNOSIS — I110 Hypertensive heart disease with heart failure: Secondary | ICD-10-CM | POA: Diagnosis not present

## 2020-10-11 DIAGNOSIS — Z96641 Presence of right artificial hip joint: Secondary | ICD-10-CM | POA: Diagnosis not present

## 2020-10-11 DIAGNOSIS — Z8544 Personal history of malignant neoplasm of other female genital organs: Secondary | ICD-10-CM | POA: Diagnosis not present

## 2020-10-11 DIAGNOSIS — I10 Essential (primary) hypertension: Secondary | ICD-10-CM | POA: Diagnosis not present

## 2020-10-11 DIAGNOSIS — Z8541 Personal history of malignant neoplasm of cervix uteri: Secondary | ICD-10-CM | POA: Diagnosis not present

## 2020-10-11 DIAGNOSIS — Z7982 Long term (current) use of aspirin: Secondary | ICD-10-CM | POA: Insufficient documentation

## 2020-10-11 DIAGNOSIS — I5032 Chronic diastolic (congestive) heart failure: Secondary | ICD-10-CM | POA: Insufficient documentation

## 2020-10-11 DIAGNOSIS — Z87891 Personal history of nicotine dependence: Secondary | ICD-10-CM | POA: Diagnosis not present

## 2020-10-11 DIAGNOSIS — L8990 Pressure ulcer of unspecified site, unspecified stage: Secondary | ICD-10-CM | POA: Insufficient documentation

## 2020-10-11 DIAGNOSIS — F32A Depression, unspecified: Secondary | ICD-10-CM | POA: Diagnosis not present

## 2020-10-11 DIAGNOSIS — J45909 Unspecified asthma, uncomplicated: Secondary | ICD-10-CM | POA: Insufficient documentation

## 2020-10-11 DIAGNOSIS — R0602 Shortness of breath: Secondary | ICD-10-CM | POA: Diagnosis present

## 2020-10-11 DIAGNOSIS — I480 Paroxysmal atrial fibrillation: Secondary | ICD-10-CM | POA: Diagnosis not present

## 2020-10-11 LAB — CBC WITH DIFFERENTIAL/PLATELET
Abs Immature Granulocytes: 0.05 10*3/uL (ref 0.00–0.07)
Basophils Absolute: 0.1 10*3/uL (ref 0.0–0.1)
Basophils Relative: 0 %
Eosinophils Absolute: 0.1 10*3/uL (ref 0.0–0.5)
Eosinophils Relative: 1 %
HCT: 45.8 % (ref 36.0–46.0)
Hemoglobin: 15.2 g/dL — ABNORMAL HIGH (ref 12.0–15.0)
Immature Granulocytes: 0 %
Lymphocytes Relative: 23 %
Lymphs Abs: 2.7 10*3/uL (ref 0.7–4.0)
MCH: 31.4 pg (ref 26.0–34.0)
MCHC: 33.2 g/dL (ref 30.0–36.0)
MCV: 94.6 fL (ref 80.0–100.0)
Monocytes Absolute: 0.6 10*3/uL (ref 0.1–1.0)
Monocytes Relative: 6 %
Neutro Abs: 7.9 10*3/uL — ABNORMAL HIGH (ref 1.7–7.7)
Neutrophils Relative %: 70 %
Platelets: 338 10*3/uL (ref 150–400)
RBC: 4.84 MIL/uL (ref 3.87–5.11)
RDW: 12.3 % (ref 11.5–15.5)
WBC: 11.5 10*3/uL — ABNORMAL HIGH (ref 4.0–10.5)
nRBC: 0 % (ref 0.0–0.2)

## 2020-10-11 LAB — I-STAT VENOUS BLOOD GAS, ED
Acid-Base Excess: 2 mmol/L (ref 0.0–2.0)
Bicarbonate: 26.6 mmol/L (ref 20.0–28.0)
Calcium, Ion: 1.14 mmol/L — ABNORMAL LOW (ref 1.15–1.40)
HCT: 42 % (ref 36.0–46.0)
Hemoglobin: 14.3 g/dL (ref 12.0–15.0)
O2 Saturation: 89 %
Potassium: 4 mmol/L (ref 3.5–5.1)
Sodium: 133 mmol/L — ABNORMAL LOW (ref 135–145)
TCO2: 28 mmol/L (ref 22–32)
pCO2, Ven: 39.9 mmHg — ABNORMAL LOW (ref 44.0–60.0)
pH, Ven: 7.433 — ABNORMAL HIGH (ref 7.250–7.430)
pO2, Ven: 56 mmHg — ABNORMAL HIGH (ref 32.0–45.0)

## 2020-10-11 LAB — COMPREHENSIVE METABOLIC PANEL
ALT: 22 U/L (ref 0–44)
AST: 26 U/L (ref 15–41)
Albumin: 3.8 g/dL (ref 3.5–5.0)
Alkaline Phosphatase: 65 U/L (ref 38–126)
Anion gap: 10 (ref 5–15)
BUN: 12 mg/dL (ref 8–23)
CO2: 25 mmol/L (ref 22–32)
Calcium: 9.2 mg/dL (ref 8.9–10.3)
Chloride: 96 mmol/L — ABNORMAL LOW (ref 98–111)
Creatinine, Ser: 0.83 mg/dL (ref 0.44–1.00)
GFR, Estimated: >60 mL/min (ref >60)
Glucose, Bld: 163 mg/dL — ABNORMAL HIGH (ref 70–99)
Potassium: 3.6 mmol/L (ref 3.5–5.1)
Sodium: 131 mmol/L — ABNORMAL LOW (ref 135–145)
Total Bilirubin: 1 mg/dL (ref 0.3–1.2)
Total Protein: 6.4 g/dL — ABNORMAL LOW (ref 6.5–8.1)

## 2020-10-11 LAB — RESP PANEL BY RT-PCR (FLU A&B, COVID) ARPGX2
Influenza A by PCR: NEGATIVE
Influenza B by PCR: NEGATIVE
SARS Coronavirus 2 by RT PCR: NEGATIVE

## 2020-10-11 LAB — TROPONIN I (HIGH SENSITIVITY)
Troponin I (High Sensitivity): 7 ng/L (ref <18)
Troponin I (High Sensitivity): 6 ng/L (ref <18)

## 2020-10-11 LAB — BRAIN NATRIURETIC PEPTIDE: B Natriuretic Peptide: 42.1 pg/mL (ref 0.0–100.0)

## 2020-10-11 MED ORDER — ASPIRIN EC 81 MG PO TBEC
81.0000 mg | DELAYED_RELEASE_TABLET | Freq: Every day | ORAL | Status: DC
  Administered 2020-10-12: 81 mg via ORAL
  Filled 2020-10-11: qty 1

## 2020-10-11 MED ORDER — IPRATROPIUM BROMIDE 0.02 % IN SOLN
0.5000 mg | RESPIRATORY_TRACT | Status: AC
  Administered 2020-10-11 (×2): 0.5 mg via RESPIRATORY_TRACT
  Filled 2020-10-11 (×2): qty 2.5

## 2020-10-11 MED ORDER — IPRATROPIUM-ALBUTEROL 0.5-2.5 (3) MG/3ML IN SOLN
3.0000 mL | RESPIRATORY_TRACT | Status: DC
  Administered 2020-10-12 (×2): 3 mL via RESPIRATORY_TRACT
  Filled 2020-10-11 (×2): qty 3

## 2020-10-11 MED ORDER — LORAZEPAM 2 MG/ML IJ SOLN
0.5000 mg | Freq: Once | INTRAMUSCULAR | Status: AC
  Administered 2020-10-11: 0.5 mg via INTRAVENOUS
  Filled 2020-10-11: qty 1

## 2020-10-11 MED ORDER — METHYLPREDNISOLONE SODIUM SUCC 125 MG IJ SOLR
125.0000 mg | Freq: Once | INTRAMUSCULAR | Status: AC
  Administered 2020-10-11: 125 mg via INTRAVENOUS
  Filled 2020-10-11: qty 2

## 2020-10-11 MED ORDER — SODIUM CHLORIDE 0.9 % IV SOLN
500.0000 mg | INTRAVENOUS | Status: DC
  Administered 2020-10-11: 500 mg via INTRAVENOUS
  Filled 2020-10-11 (×2): qty 500

## 2020-10-11 MED ORDER — IPRATROPIUM-ALBUTEROL 0.5-2.5 (3) MG/3ML IN SOLN
3.0000 mL | Freq: Once | RESPIRATORY_TRACT | Status: AC
  Administered 2020-10-11: 3 mL via RESPIRATORY_TRACT

## 2020-10-11 MED ORDER — METHYLPREDNISOLONE SODIUM SUCC 40 MG IJ SOLR
40.0000 mg | Freq: Every day | INTRAMUSCULAR | Status: DC
  Administered 2020-10-12: 40 mg via INTRAVENOUS
  Filled 2020-10-11: qty 1

## 2020-10-11 MED ORDER — ALBUTEROL (5 MG/ML) CONTINUOUS INHALATION SOLN
15.0000 mg/h | INHALATION_SOLUTION | Freq: Once | RESPIRATORY_TRACT | Status: AC
  Administered 2020-10-11: 15 mg/h via RESPIRATORY_TRACT
  Filled 2020-10-11: qty 20

## 2020-10-11 MED ORDER — MAGNESIUM SULFATE 2 GM/50ML IV SOLN
2.0000 g | Freq: Once | INTRAVENOUS | Status: AC
  Administered 2020-10-11: 2 g via INTRAVENOUS
  Filled 2020-10-11: qty 50

## 2020-10-11 MED ORDER — ALBUTEROL SULFATE (2.5 MG/3ML) 0.083% IN NEBU
5.0000 mg | INHALATION_SOLUTION | Freq: Once | RESPIRATORY_TRACT | Status: AC
  Administered 2020-10-11: 5 mg via RESPIRATORY_TRACT

## 2020-10-11 NOTE — ED Triage Notes (Signed)
Patient complains of increased SOB x 1 day. Has COPD and states that she is out of inhaler. Patient alert and oriented. Complains of chest tightness. Patient with wheezing and grunting.

## 2020-10-11 NOTE — H&P (Signed)
History and Physical    Kim Blankenship DJM:[id number] DOB: 08-Apr-1945 DOA: 10/11/2020  PCP: Jolinda Croak, MD  Patient coming from: Home  I have personally briefly reviewed patient's old medical records in [place]  Chief Complaint: shortness of breath  HPI: Kim Blankenship is a 75 y.o. female with medical history significant for COPD, CVA, chronic diastolic heart failure, paroxysmal atrial fibrillation not on anticoagulation due to history of GI bleed, hypertension, valvular intraepithelial neoplasm, history of seizures, hyperlipidemia and depression who presents with concerns of increasing shortness of breath.  Patient unable to provide a great history since she was extremely drowsy from receiving IV Ativan.  Reports she has been having a day and a half of increasing shortness of breath.  Initially it was with exertion and now constantly at rest.  Denies any worsening cough but can tell that she has some sputum production.  No fever.  Denies tobacco use.  She was last evaluated in the ED on 7/2 for COPD exacerbation but declined admission at that time.  She was prescribed prednisone and doxycycline.  In the ED, she reportedly presented with tachypnea and grunting but no hypoxia.  She was placed on BiPAP for continuous albuterol nebulizer but could not finish course due to anxiety with BiPAP.  She was also given IV magnesium, Solu-Medrol, DuoNeb, azithromycin and a dose of IV Ativan.  She was afebrile, tachycardic and initially hypertensive up to 180s.  WBC of 11.5, NA 131, BG 163.  pH of 7.43 with CO2 of 39.  Review of Systems: Unable to fully obtain as patient was extremely drowsy Past Medical History:  Diagnosis Date   Asthma    Atrial fibrillation ([place])    Cancer ([place])    cervical   COPD (chronic obstructive pulmonary disease) ([place])    Hypertension    Irregular heart rate    PRES (posterior reversible encephalopathy syndrome)    Stroke [facility])     Past  Surgical History:  Procedure Laterality Date   CESAREAN SECTION     Left Foot Reconstruction     OPEN REDUCTION INTERNAL FIXATION (ORIF) METACARPAL Right 03/10/2015   Procedure: OPEN REDUCTION INTERNAL FIXATION (ORIF) RIGHT LONG  METACARPAL FRACTURE;  Surgeon: Leanora Cover, MD;  Location: [place];  Service: Orthopedics;  Laterality: Right;   TONSILLECTOMY     TOTAL HIP ARTHROPLASTY Right 02/09/2016   Procedure: TOTAL HIP ARTHROPLASTY ANTERIOR APPROACH;  Surgeon: Frederik Pear, MD;  Location: [facility];  Service: Orthopedics;  Laterality: Right;     reports that she quit smoking about 25 years ago. Her smoking use included cigarettes. She has a 60.00 pack-year smoking history. She has never used smokeless tobacco. She reports current alcohol use. She reports that she does not use drugs. Social History  Allergies  Allergen Reactions   Levofloxacin     Other reaction(s): Other Leg pain   Tape Other (See Comments)    THE PATIENT'S SKIN IS THIN AND TEARS VERY EASILY (she "picks" at it; please use coban wrap)    Family History  Problem Relation Age of Onset   Dementia Father      Prior to Admission medications   Medication Sig Start Date End Date Taking? Authorizing Provider  albuterol (VENTOLIN HFA) 108 (90 Base) MCG/ACT inhaler Inhale 2 puffs into the lungs every 4 (four) hours as needed for wheezing or shortness of breath. 09/30/20   Isla Pence, MD  amLODipine (NORVASC) 2.5 MG tablet Take 2.5 mg by  mouth daily.    [provider]  aspirin EC 81 MG tablet Take 81 mg by mouth daily.    [provider]  atorvastatin (LIPITOR) 20 MG tablet Take 20 mg by mouth daily. 04/08/16   [provider]  busPIRone (BUSPAR) 10 MG tablet Take 10 mg by mouth 2 (two) times daily. 01/29/20   [provider]  calcium carbonate (TUMS - DOSED IN MG ELEMENTAL CALCIUM) 500 MG chewable tablet Chew 1 tablet by mouth daily as needed for indigestion or heartburn.     [provider]  chlorpheniramine-HYDROcodone (TUSSIONEX PENNKINETIC ER) 10-8 MG/5ML SUER Take 5 mLs by mouth every 12 (twelve) hours as needed for cough. 09/30/20   Isla Pence, MD  doxycycline (VIBRAMYCIN) 100 MG capsule Take 1 capsule (100 mg total) by mouth 2 (two) times daily. 09/30/20   Isla Pence, MD  ferrous sulfate 325 (65 FE) MG tablet Take 1 tablet (325 mg total) by mouth 2 (two) times daily with a meal. 12/10/18   Hongalgi, Lenis Dickinson, MD  folic acid (FOLVITE) 1 MG tablet Take 1 tablet (1 mg total) by mouth daily. 12/11/18   Hongalgi, Lenis Dickinson, MD  Ipratropium-Albuterol (COMBIVENT RESPIMAT) 20-100 MCG/ACT AERS respimat Inhale 1 puff into the lungs every 6 (six) hours. 09/30/20   Isla Pence, MD  metoprolol tartrate (LOPRESSOR) 50 MG tablet Take 50 mg by mouth 2 (two) times daily. 01/29/20   [provider]  Multiple Vitamin (MULTIVITAMIN WITH MINERALS) TABS tablet Take 1 tablet by mouth daily. 12/11/18   Hongalgi, Lenis Dickinson, MD  OLANZapine (ZYPREXA) 2.5 MG tablet Take 2.5 mg by mouth at bedtime.    [provider]  predniSONE (STERAPRED UNI-PAK 21 TAB) 10 MG (21) TBPK tablet Take by mouth daily. Take 6 tabs by mouth daily  for 2 days, then 5 tabs for 2 days, then 4 tabs for 2 days, then 3 tabs for 2 days, 2 tabs for 2 days, then 1 tab by mouth daily for 2 days 09/30/20   Isla Pence, MD  QUEtiapine (SEROQUEL) 100 MG tablet Take 200 mg by mouth at bedtime.     [provider]  spironolactone (ALDACTONE) 25 MG tablet Take 25 mg by mouth daily.    [provider]  thiamine 100 MG tablet Take 1 tablet (100 mg total) by mouth daily. 12/11/18   Hongalgi, Lenis Dickinson, MD  tiZANidine (ZANAFLEX) 2 MG tablet Take 2 mg by mouth at bedtime.    [provider]    Physical Exam: Vitals:   10/11/20 1815 10/11/20 1830 10/11/20 1845 10/11/20 1900  BP: 128/73 (!) 186/90 (!) 144/67 (!) 131/52  Pulse: (!) 109 (!) 111 95 (!) 106  Resp: (!) 25 (!) 29 17  (!) 23  Temp:      TempSrc:      SpO2: 94% 92% 92% 95%    Constitutional: NAD, calm, comfortable, nontoxic appearing female sitting upright in drowsy bed Vitals:   10/11/20 1815 10/11/20 1830 10/11/20 1845 10/11/20 1900  BP: 128/73 (!) 186/90 (!) 144/67 (!) 131/52  Pulse: (!) 109 (!) 111 95 (!) 106  Resp: (!) 25 (!) 29 17 (!) 23  Temp:      TempSrc:      SpO2: 94% 92% 92% 95%   Eyes: PERRL, lids and conjunctivae normal ENMT: Mucous membranes are moist.  Neck: normal, supple Respiratory: Bibasilar crackles but no wheezing. Normal respiratory effort on ambient air. No accessory muscle use.  Cardiovascular: Regular rate and  rhythm, no murmurs / rubs / gallops. No extremity edema.  Abdomen: no tenderness, no masses palpated.  Bowel sounds positive.  Musculoskeletal: no clubbing / cyanosis. No joint deformity upper and lower extremities. Good ROM, no contractures. Normal muscle tone.  Skin: no rashes, lesions, ulcers. No induration Neurologic: CN 2-12 grossly intact. Sensation intact. Strength 5/5 in all 4.  Psychiatric: Alert and oriented x 3.  Drowsy.   Labs on Admission: I have personally reviewed following labs and imaging studies  CBC: Recent Labs  Lab 10/11/20 1642 10/11/20 2037  WBC 11.5*  --   NEUTROABS 7.9*  --   HGB 15.2* 14.3  HCT 45.8 42.0  MCV 94.6  --   PLT 338  --    Basic Metabolic Panel: Recent Labs  Lab 10/11/20 1642 10/11/20 2037  NA 131* 133*  K 3.6 4.0  CL 96*  --   CO2 25  --   GLUCOSE 163*  --   BUN 12  --   CREATININE 0.83  --   CALCIUM 9.2  --    GFR: CrCl cannot be calculated (Unknown ideal weight.). Liver Function Tests: Recent Labs  Lab 10/11/20 1642  AST 26  ALT 22  ALKPHOS 65  BILITOT 1.0  PROT 6.4*  ALBUMIN 3.8   No results for input(s): LIPASE, AMYLASE in the last 168 hours. No results for input(s): AMMONIA in the last 168 hours. Coagulation Profile: No results for input(s): INR, PROTIME in the last 168 hours. Cardiac  Enzymes: No results for input(s): CKTOTAL, CKMB, CKMBINDEX, TROPONINI in the last 168 hours. BNP (last 3 results) No results for input(s): PROBNP in the last 8760 hours. HbA1C: No results for input(s): HGBA1C in the last 72 hours. CBG: No results for input(s): GLUCAP in the last 168 hours. Lipid Profile: No results for input(s): CHOL, HDL, LDLCALC, TRIG, CHOLHDL, LDLDIRECT in the last 72 hours. Thyroid Function Tests: No results for input(s): TSH, T4TOTAL, FREET4, T3FREE, THYROIDAB in the last 72 hours. Anemia Panel: No results for input(s): VITAMINB12, FOLATE, FERRITIN, TIBC, IRON, RETICCTPCT in the last 72 hours. Urine analysis:    Component Value Date/Time   COLORURINE YELLOW 04/25/2020 [place] 04/25/2020 1536   LABSPEC 1.011 04/25/2020 1536   PHURINE 6.0 04/25/2020 1536   GLUCOSEU NEGATIVE 04/25/2020 1536   HGBUR NEGATIVE 04/25/2020 1536   BILIRUBINUR NEGATIVE 04/25/2020 1536   KETONESUR 5 (A) 04/25/2020 1536   PROTEINUR NEGATIVE 04/25/2020 1536   NITRITE NEGATIVE 04/25/2020 1536   LEUKOCYTESUR LARGE (A) 04/25/2020 1536    Radiological Exams on Admission: DG Chest Portable 1 View  Result Date: 10/11/2020 CLINICAL DATA:  Shortness of breath EXAM: PORTABLE CHEST 1 VIEW COMPARISON:  September 30, 2020 FINDINGS: The heart size and mediastinal contours are within normal limits. Both lungs are clear. The visualized skeletal structures are unremarkable. IMPRESSION: No active disease. Electronically Signed   By: Dahlia Bailiff MD   On: 10/11/2020 17:21      Assessment/Plan  COPD exacerbation - pt not able to tell me whether she has been using inhalers and was too drowsy after getting IV ativan to give good history - Scheduled DuoNeb q4hr - continue IV solu-medrol - continue Azithromycin  CVA -continue aspirin  History of paroxysmal atrial fibrillation - Not on anticoagulation likely due to history of GI bleed  HTN -continue home antihypertensives  Hx of  seizure  -pt has Keppra on med list but tells me she does not take it. Daughter did not  complete med rec with pharmacy tech. Will need to follow up tomorrow  Depression -resume home meds once pt able to verify meds  DVT prophylaxis:.Lovenox Code Status: Full Family Communication: Plan discussed with patient at bedside  disposition Plan: Home with observation Consults called:  Admission status: Observation  Level of care: Telemetry Medical  Status is: Observation  The patient remains OBS appropriate and will d/c before 2 midnights.  Dispo: The patient is from: Home              Anticipated d/c is to: Home              Patient currently is not medically stable to d/c.   Difficult to place patient No         Orene Desanctis DO [facility]   If 7PM-7AM, please contact night-coverage [email or web address]   10/11/2020, 9:40 PM

## 2020-10-11 NOTE — ED Notes (Signed)
Lab/micro state they do not have covid swab.  Will recollect.  Provider aware

## 2020-10-11 NOTE — ED Notes (Signed)
Demanded bipap off, mask removed,  feeling claustrophobic,1hr long neb not complete, RT and EDP notified.

## 2020-10-11 NOTE — Progress Notes (Signed)
Patient taken off bipap. Cont neb finished with neb mask. Patient tolerating well at this time.

## 2020-10-11 NOTE — ED Provider Notes (Signed)
[place] EMERGENCY DEPARTMENT Provider Note   CSN: [id number] Arrival date & time: 10/11/20  1600     History No chief complaint on file.   Kim Blankenship is a 75 y.o. female.  HPI     75 year old female with a history of COPD, seizure disorder, paroxysmal atrial fibrillation on anticoagulation, CVA, hypertension who presents with concern for shortness of breath.  Reports that her COPD has been worse over the last month, and that she had been seen in the emergency department on 2 July for similar.  Reports she had had some improvement, however yesterday, developed severe shortness of breath.  Rates it a 9 out of 10.  Describes wheezing.  Reports that when she uses her albuterol inhaler with spacer, her symptoms improved, however they then worsened and become severe again.  Denies any significant chest pain on my history. Did state she had chest tightness in triage consistent with COPD.  Reports a cough, occasionally productive of a small amount of mucus.  Denies it being significantly worse than usual, but does report his mildly increased since yesterday.  No fevers, congestion, sore throat, sick contacts or other concerns.  Past Medical History:  Diagnosis Date   Asthma    Atrial fibrillation ([place])    Cancer ([place])    cervical   COPD (chronic obstructive pulmonary disease) ([place])    Hypertension    Irregular heart rate    PRES (posterior reversible encephalopathy syndrome)    Stroke [facility])     Patient Active Problem List   Diagnosis Date Noted   COPD exacerbation ([place]) 10/11/2020   History of stroke 75/91/6384   Acute metabolic encephalopathy 66/59/9357   Acute respiratory failure with hypoxia ([place]) 07/23/2019   Acute encephalopathy 07/22/2019   Overdose opiate, accidental or unintentional, initial encounter ([place]) 07/22/2019   Vulvar cancer ([place]) 07/22/2019   Delirium    GIB (gastrointestinal bleeding) 12/04/2018   Chronic diastolic CHF (congestive  heart failure) ([place]) 12/04/2018   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   Stroke (cerebrum) ([place]) 11/21/2016   Hyponatremia 11/20/2016   UTI (urinary tract infection) 11/20/2016   HLD (hyperlipidemia) 11/20/2016   GERD (gastroesophageal reflux disease) 11/20/2016   Depression 11/20/2016   Hypertensive emergency 11/04/2016   EtOH dependence ([place]) 11/04/2016   Hyperglycemia 11/04/2016   Essential hypertension    Hypertensive urgency 10/30/2016   PAF (paroxysmal atrial fibrillation) ([place]) 10/30/2016   PRES (posterior reversible encephalopathy syndrome) 10/30/2016   Seizure ([place])    Acute blood loss anemia    Closed fracture of right hip ([place]) 02/09/2016   Chest pain 11/07/2015   COPD (chronic obstructive pulmonary disease) ([place]) 08/19/2013   Abnormal EKG 08/19/2013   Fever, unspecified 08/19/2013    Past Surgical History:  Procedure Laterality Date   CESAREAN SECTION     Left Foot Reconstruction     OPEN REDUCTION INTERNAL FIXATION (ORIF) METACARPAL Right 03/10/2015   Procedure: OPEN REDUCTION INTERNAL FIXATION (ORIF) RIGHT LONG  METACARPAL FRACTURE;  Surgeon: Leanora Cover, MD;  Location: [place];  Service: Orthopedics;  Laterality: Right;   TONSILLECTOMY     TOTAL HIP ARTHROPLASTY Right 02/09/2016   Procedure: TOTAL HIP ARTHROPLASTY ANTERIOR APPROACH;  Surgeon: Frederik Pear, MD;  [facility];  Service: Orthopedics;  Laterality: Right;     OB History     Gravida      Para      Term      Preterm  AB      Living  1      SAB      IAB      Ectopic      Multiple      Live Births              Family History  Problem Relation Age of Onset   Dementia Father     Social History   Tobacco Use   Smoking status: Former    Packs/day: 2.00    Years: 30.00    Pack years: 60.00    Types: Cigarettes    Quit date: 04/1995    Years since quitting: 25.5   Smokeless tobacco: Never  Vaping Use    Vaping Use: Never used  Substance Use Topics   Alcohol use: Yes   Drug use: No    Home Medications Prior to Admission medications   Medication Sig Start Date End Date Taking? Authorizing Provider  albuterol (VENTOLIN HFA) 108 (90 Base) MCG/ACT inhaler Inhale 2 puffs into the lungs every 4 (four) hours as needed for wheezing or shortness of breath. 09/30/20   Isla Pence, MD  amLODipine (NORVASC) 2.5 MG tablet Take 2.5 mg by mouth daily.    [provider]  aspirin EC 81 MG tablet Take 81 mg by mouth daily.    [provider]  atorvastatin (LIPITOR) 20 MG tablet Take 20 mg by mouth daily. 04/08/16   [provider]  busPIRone (BUSPAR) 10 MG tablet Take 10 mg by mouth 2 (two) times daily. 01/29/20   [provider]  calcium carbonate (TUMS - DOSED IN MG ELEMENTAL CALCIUM) 500 MG chewable tablet Chew 1 tablet by mouth daily as needed for indigestion or heartburn.    [provider]  chlorpheniramine-HYDROcodone (TUSSIONEX PENNKINETIC ER) 10-8 MG/5ML SUER Take 5 mLs by mouth every 12 (twelve) hours as needed for cough. 09/30/20   Isla Pence, MD  doxycycline (VIBRAMYCIN) 100 MG capsule Take 1 capsule (100 mg total) by mouth 2 (two) times daily. 09/30/20   Isla Pence, MD  ferrous sulfate 325 (65 FE) MG tablet Take 1 tablet (325 mg total) by mouth 2 (two) times daily with a meal. 12/10/18   Hongalgi, Lenis Dickinson, MD  folic acid (FOLVITE) 1 MG tablet Take 1 tablet (1 mg total) by mouth daily. 12/11/18   Hongalgi, Lenis Dickinson, MD  Ipratropium-Albuterol (COMBIVENT RESPIMAT) 20-100 MCG/ACT AERS respimat Inhale 1 puff into the lungs every 6 (six) hours. 09/30/20   Isla Pence, MD  metoprolol tartrate (LOPRESSOR) 50 MG tablet Take 50 mg by mouth 2 (two) times daily. 01/29/20   [provider]  Multiple Vitamin (MULTIVITAMIN WITH MINERALS) TABS tablet Take 1 tablet by mouth daily. 12/11/18   Hongalgi, Lenis Dickinson, MD  OLANZapine (ZYPREXA) 2.5 MG tablet Take 2.5  mg by mouth at bedtime.    [provider]  predniSONE (STERAPRED UNI-PAK 21 TAB) 10 MG (21) TBPK tablet Take by mouth daily. Take 6 tabs by mouth daily  for 2 days, then 5 tabs for 2 days, then 4 tabs for 2 days, then 3 tabs for 2 days, 2 tabs for 2 days, then 1 tab by mouth daily for 2 days 09/30/20   Isla Pence, MD  QUEtiapine (SEROQUEL) 100 MG tablet Take 200 mg by mouth at bedtime.     [provider]  spironolactone (ALDACTONE) 25 MG tablet Take 25 mg by mouth daily.    [provider]  thiamine 100 MG tablet  Take 1 tablet (100 mg total) by mouth daily. 12/11/18   Hongalgi, Lenis Dickinson, MD  tiZANidine (ZANAFLEX) 2 MG tablet Take 2 mg by mouth at bedtime.    [provider]    Allergies    Levofloxacin and Tape  Review of Systems   Review of Systems  Constitutional:  Negative for fever.  Respiratory:  Positive for cough, chest tightness, shortness of breath and wheezing.   Cardiovascular:  Negative for chest pain.  Gastrointestinal:  Negative for abdominal pain, nausea and vomiting.   Physical Exam Updated Vital Signs BP 139/72   Pulse 77   Temp 98.3 F (36.8 C) (Oral)   Resp 16   SpO2 93%   Physical Exam  ED Results / Procedures / Treatments   Labs (all labs ordered are listed, but only abnormal results are displayed) Labs Reviewed  CBC WITH DIFFERENTIAL/PLATELET - Abnormal; Notable for the following components:      Result Value   WBC 11.5 (*)    Hemoglobin 15.2 (*)    Neutro Abs 7.9 (*)    All other components within normal limits  COMPREHENSIVE METABOLIC PANEL - Abnormal; Notable for the following components:   Sodium 131 (*)    Chloride 96 (*)    Glucose, Bld 163 (*)    Total Protein 6.4 (*)    All other components within normal limits  I-STAT VENOUS BLOOD GAS, ED - Abnormal; Notable for the following components:   pH, Ven 7.433 (*)    pCO2, Ven 39.9 (*)    pO2, Ven 56.0 (*)    Sodium 133 (*)    Calcium, Ion 1.14 (*)     All other components within normal limits  RESP PANEL BY RT-PCR (FLU A&B, COVID) ARPGX2  BRAIN NATRIURETIC PEPTIDE  BASIC METABOLIC PANEL  CBC  TROPONIN I (HIGH SENSITIVITY)  TROPONIN I (HIGH SENSITIVITY)    EKG EKG Interpretation  Date/Time:  Wednesday October 11 2020 16:34:36 EDT Ventricular Rate:  106 PR Interval:  180 QRS Duration: 81 QT Interval:  323 QTC Calculation: 429 R Axis:   97 Text Interpretation: Sinus tachycardia Multiple premature complexes, vent & supraven Aberrant complex Right axis deviation Borderline ST depression, diffuse leads No significant change since last tracing Confirmed by Gareth Morgan [phone]) on 10/11/2020 4:56:16 PM  Radiology DG Chest Portable 1 View  Result Date: 10/11/2020 CLINICAL DATA:  Shortness of breath EXAM: PORTABLE CHEST 1 VIEW COMPARISON:  September 30, 2020 FINDINGS: The heart size and mediastinal contours are within normal limits. Both lungs are clear. The visualized skeletal structures are unremarkable. IMPRESSION: No active disease. Electronically Signed   By: Dahlia Bailiff MD   On: 10/11/2020 17:21    Procedures Procedures   Medications Ordered in ED Medications  ipratropium (ATROVENT) nebulizer solution 0.5 mg (0.5 mg Nebulization Given 10/11/20 1749)  azithromycin (ZITHROMAX) 500 mg in sodium chloride 0.9 % 250 mL IVPB (0 mg Intravenous Stopped 10/11/20 1814)  ipratropium-albuterol (DUONEB) 0.5-2.5 (3) MG/3ML nebulizer solution 3 mL (3 mLs Nebulization Patient Refused/Not Given 10/11/20 2238)  methylPREDNISolone sodium succinate (SOLU-MEDROL) 40 mg/mL injection 40 mg (has no administration in time range)  aspirin EC tablet 81 mg (has no administration in time range)  albuterol (PROVENTIL,VENTOLIN) solution continuous neb (15 mg/hr Nebulization Given 10/11/20 1704)  methylPREDNISolone sodium succinate (SOLU-MEDROL) 125 mg/2 mL injection 125 mg (125 mg Intravenous Given 10/11/20 1709)  magnesium sulfate IVPB 2 g 50 mL (0 g Intravenous  Stopped 10/11/20 1814)  ipratropium-albuterol (DUONEB) 0.5-2.5 (3)  MG/3ML nebulizer solution 3 mL (3 mLs Nebulization Given 10/11/20 1657)  albuterol (PROVENTIL) (2.5 MG/3ML) 0.083% nebulizer solution 5 mg (5 mg Nebulization Given 10/11/20 1655)  LORazepam (ATIVAN) injection 0.5 mg (0.5 mg Intravenous Given 10/11/20 1818)    ED Course  I have reviewed the triage vital signs and the nursing notes.  Pertinent labs & imaging results that were available during my care of the patient were reviewed by me and considered in my medical decision making (see chart for details).    MDM Rules/Calculators/A&P                           75 year old female with a history of COPD, seizure disorder, paroxysmal atrial fibrillation on anticoagulation, CVA, hypertension who presents with concern for shortness of breath.  Chest x-ray was done which showed no pneumonia or pneumothorax. EKG was evaluated by me which showed no acute changes.  BNP wasnegative.  P  Denies CP, troponin negative, doubt ACS.     History and exam are most consistent with COPD exacerbation.  Given significant work of breathing and grunting on arrival, placed on BiPAP and given continuous albuterol and Atrovent, as well as Solu-Medrol and magnesium and azithromycin for COPD exacerbation.   She became anxious on bipap and this was removed and she was given .5mg  IV ativan.  Breathing improved following breathing treatments, sleepy on reevaluation but wakes appropriately, venous blood gas does not show retention.  Admitted for further care of COPD exacerbation. Has been off of medications and this may contribute to etiology. COVID testing pending.    Final Clinical Impression(s) / ED Diagnoses Final diagnoses:  COPD exacerbation [facility])    Rx / DC Orders ED Discharge Orders     None        Gareth Morgan, MD 10/12/20 [id number]

## 2020-10-11 NOTE — ED Notes (Signed)
Repeat covid swab collected and sent to lab

## 2020-10-11 NOTE — ED Notes (Signed)
EDP finished at [facility], orders received and initiated.

## 2020-10-12 DIAGNOSIS — J441 Chronic obstructive pulmonary disease with (acute) exacerbation: Secondary | ICD-10-CM | POA: Diagnosis not present

## 2020-10-12 DIAGNOSIS — I10 Essential (primary) hypertension: Secondary | ICD-10-CM | POA: Diagnosis not present

## 2020-10-12 DIAGNOSIS — L8990 Pressure ulcer of unspecified site, unspecified stage: Secondary | ICD-10-CM | POA: Insufficient documentation

## 2020-10-12 LAB — CBC
HCT: 42.3 % (ref 36.0–46.0)
Hemoglobin: 14.1 g/dL (ref 12.0–15.0)
MCH: 31.5 pg (ref 26.0–34.0)
MCHC: 33.3 g/dL (ref 30.0–36.0)
MCV: 94.4 fL (ref 80.0–100.0)
Platelets: 301 10*3/uL (ref 150–400)
RBC: 4.48 MIL/uL (ref 3.87–5.11)
RDW: 12.4 % (ref 11.5–15.5)
WBC: 8.2 10*3/uL (ref 4.0–10.5)
nRBC: 0 % (ref 0.0–0.2)

## 2020-10-12 LAB — BASIC METABOLIC PANEL
Anion gap: 12 (ref 5–15)
BUN: 11 mg/dL (ref 8–23)
CO2: 22 mmol/L (ref 22–32)
Calcium: 9.2 mg/dL (ref 8.9–10.3)
Chloride: 100 mmol/L (ref 98–111)
Creatinine, Ser: 0.96 mg/dL (ref 0.44–1.00)
GFR, Estimated: >60 mL/min (ref >60)
Glucose, Bld: 161 mg/dL — ABNORMAL HIGH (ref 70–99)
Potassium: 4.4 mmol/L (ref 3.5–5.1)
Sodium: 134 mmol/L — ABNORMAL LOW (ref 135–145)

## 2020-10-12 MED ORDER — SPIRIVA HANDIHALER 18 MCG IN CAPS: 18.0000 ug | ORAL_CAPSULE | Freq: Every day | RESPIRATORY_TRACT | 2 refills | Status: DC

## 2020-10-12 MED ORDER — MOMETASONE FURO-FORMOTEROL FUM 200-5 MCG/ACT IN AERO
2.0000 | INHALATION_SPRAY | Freq: Two times a day (BID) | RESPIRATORY_TRACT | Status: DC
  Administered 2020-10-12: 2 via RESPIRATORY_TRACT
  Filled 2020-10-12: qty 8.8

## 2020-10-12 MED ORDER — AZITHROMYCIN 500 MG PO TABS: 500.0000 mg | ORAL_TABLET | Freq: Every day | ORAL | 0 refills | Status: AC

## 2020-10-12 MED ORDER — MOMETASONE FURO-FORMOTEROL FUM 200-5 MCG/ACT IN AERO
2.0000 | INHALATION_SPRAY | Freq: Two times a day (BID) | RESPIRATORY_TRACT | 3 refills | Status: DC
Start: 2020-10-12 — End: 2020-10-22

## 2020-10-12 MED ORDER — PREDNISONE 10 MG PO TABS: ORAL_TABLET | ORAL | 0 refills | Status: DC

## 2020-10-12 MED ORDER — UMECLIDINIUM BROMIDE 62.5 MCG/INH IN AEPB
1.0000 | INHALATION_SPRAY | Freq: Every day | RESPIRATORY_TRACT | Status: DC
  Administered 2020-10-12: 1 via RESPIRATORY_TRACT
  Filled 2020-10-12: qty 7

## 2020-10-12 MED ORDER — IPRATROPIUM-ALBUTEROL 0.5-2.5 (3) MG/3ML IN SOLN: 3.0000 mL | RESPIRATORY_TRACT | Status: DC | PRN

## 2020-10-12 MED ORDER — ACETAMINOPHEN 325 MG PO TABS
650.0000 mg | ORAL_TABLET | Freq: Four times a day (QID) | ORAL | Status: DC | PRN
  Administered 2020-10-12: 650 mg via ORAL
  Filled 2020-10-12: qty 2

## 2020-10-12 NOTE — Discharge Summary (Signed)
Physician Discharge Summary  Shavaughn Seidl QBH:[id number] DOB: Nov 07, 1945 DOA: 10/11/2020  PCP: Jolinda Croak, MD  Admit date: 10/11/2020 Discharge date: 10/12/2020  Admitted From: Home Disposition:  Home  Recommendations for Outpatient Follow-up:  Follow up with PCP in 1-2 weeks Please obtain BMP/CBC in one week   Home Health:no Equipment/Devices:none  Discharge Condition:Stable CODE STATUS:Full Diet recommendation: Heart Healthy   Brief/Interim Summary: 76 y.o. female past medical history of COPD, CVA chronic diastolic heart failure, paroxysmal atrial fibrillation not on anticoagulation due to a GI bleed, history of seizure disorder comes into the hospital for shortness of breath in the ED she was found tachycardic but not hypoxic was placed on BiPAP started on magnesium nebulizers steroids antibiotics and Ativan.  Discharge Diagnoses:  Principal Problem:   COPD exacerbation ([place]) Active Problems:   Essential hypertension   Depression   History of stroke  Acute COPD exacerbation: She was started on inhalers steroids and antibiotics. By the next day her breathing had improved, she was ambulated and her sats remained greater than 90%. She will go home on a steroid taper.,  Antibiotics and inhalers.  History of CVA: Continue aspirin no changes made.  History of paroxysmal atrial fibrillation: Rate controlled in sinus rhythm, not on anticoagulation due to history of GI bleed.  Essential hypertension: No changes made on her blood pressure medication.  History of seizures: Continue Keppra.  Discharge Instructions  Discharge Instructions     Diet - low sodium heart healthy   Complete by: As directed    Increase activity slowly   Complete by: As directed    No wound care   Complete by: As directed       Allergies as of 10/12/2020       Reactions   Levofloxacin    Other reaction(s): Other Leg pain   Tape Other (See Comments)   THE PATIENT'S SKIN IS  THIN AND TEARS VERY EASILY (she "picks" at it; please use coban wrap)        Medication List     STOP taking these medications    predniSONE 10 MG (21) Tbpk tablet Commonly known as: STERAPRED UNI-PAK 21 TAB Replaced by: predniSONE 10 MG tablet       TAKE these medications    albuterol 108 (90 Base) MCG/ACT inhaler Commonly known as: VENTOLIN HFA Inhale 2 puffs into the lungs every 4 (four) hours as needed for wheezing or shortness of breath.   amLODipine 2.5 MG tablet Commonly known as: NORVASC Take 2.5 mg by mouth daily.   aspirin EC 81 MG tablet Take 81 mg by mouth daily.   atorvastatin 20 MG tablet Commonly known as: LIPITOR Take 20 mg by mouth daily.   azithromycin 500 MG tablet Commonly known as: Zithromax Take 1 tablet (500 mg total) by mouth daily for 3 days. Take 1 tablet daily for 3 days.   busPIRone 10 MG tablet Commonly known as: BUSPAR Take 10 mg by mouth 2 (two) times daily.   calcium carbonate 500 MG chewable tablet Commonly known as: TUMS - dosed in mg elemental calcium Chew 1 tablet by mouth daily as needed for indigestion or heartburn.   chlorpheniramine-HYDROcodone 10-8 MG/5ML Suer Commonly known as: Tussionex Pennkinetic ER Take 5 mLs by mouth every 12 (twelve) hours as needed for cough.   Combivent Respimat 20-100 MCG/ACT Aers respimat Generic drug: Ipratropium-Albuterol Inhale 1 puff into the lungs every 6 (six) hours.   doxycycline 100 MG capsule Commonly known as: VIBRAMYCIN Take  1 capsule (100 mg total) by mouth 2 (two) times daily.   ferrous sulfate 325 (65 FE) MG tablet Take 1 tablet (325 mg total) by mouth 2 (two) times daily with a meal.   folic acid 1 MG tablet Commonly known as: FOLVITE Take 1 tablet (1 mg total) by mouth daily.   metoprolol tartrate 50 MG tablet Commonly known as: LOPRESSOR Take 50 mg by mouth 2 (two) times daily.   mometasone-formoterol 200-5 MCG/ACT Aero Commonly known as: DULERA Inhale 2 puffs  into the lungs 2 (two) times daily.   multivitamin with minerals Tabs tablet Take 1 tablet by mouth daily.   OLANZapine 2.5 MG tablet Commonly known as: ZYPREXA Take 2.5 mg by mouth at bedtime.   predniSONE 10 MG tablet Commonly known as: DELTASONE Takes 6 tablets for 1 days, then 5 tablets for 1 days, then 4 tablets for 1 days, then 3 tablets for 1 days, then 2 tabs for 1 days, then 1 tab for 1 days, and then stop. Replaces: predniSONE 10 MG (21) Tbpk tablet   QUEtiapine 100 MG tablet Commonly known as: SEROQUEL Take 200 mg by mouth at bedtime.   Spiriva HandiHaler 18 MCG inhalation capsule Generic drug: tiotropium Place 1 capsule (18 mcg total) into inhaler and inhale daily.   spironolactone 25 MG tablet Commonly known as: ALDACTONE Take 25 mg by mouth daily.   thiamine 100 MG tablet Take 1 tablet (100 mg total) by mouth daily.   tiZANidine 2 MG tablet Commonly known as: ZANAFLEX Take 2 mg by mouth at bedtime.        Allergies  Allergen Reactions   Levofloxacin     Other reaction(s): Other Leg pain   Tape Other (See Comments)    THE PATIENT'S SKIN IS THIN AND TEARS VERY EASILY (she "picks" at it; please use coban wrap)    Consultations: None   Procedures/Studies: DG Chest 2 View  Result Date: 09/30/2020 CLINICAL DATA:  SHORTNESS OF BREATH. HEADACHE PAIN 5 DAYS HISTORY OF COPD. EXAM: CHEST - 2 VIEW COMPARISON:  08/29/2020 FINDINGS: Lungs are mildly hyperinflated. Heart size is normal. There is a small LEFT pleural effusion. No focal consolidations or pulmonary edema. Numerous chronic thoracic wedge compression fractures. IMPRESSION: Small LEFT effusion. Electronically Signed   By: Nolon Nations M.D.   On: 09/30/2020 10:50   DG Chest Portable 1 View  Result Date: 10/11/2020 CLINICAL DATA:  Shortness of breath EXAM: PORTABLE CHEST 1 VIEW COMPARISON:  September 30, 2020 FINDINGS: The heart size and mediastinal contours are within normal limits. Both lungs are clear.  The visualized skeletal structures are unremarkable. IMPRESSION: No active disease. Electronically Signed   By: Dahlia Bailiff MD   On: 10/11/2020 17:21     Subjective: No complaints relates her breathing is significantly better.  Discharge Exam: Vitals:   10/12/20 0441 10/12/20 0508  BP: 135/77 132/74  Pulse: 88 87  Resp: 15 20  Temp: 98 F (36.7 C) 98 F (36.7 C)  SpO2: 95% 97%   Vitals:   10/11/20 2330 10/12/20 0055 10/12/20 0441 10/12/20 0508  BP: 139/72 (!) 147/68 135/77 132/74  Pulse: 77 86 88 87  Resp: 16 20 15 20   Temp:  98.1 F (36.7 C) 98 F (36.7 C) 98 F (36.7 C)  TempSrc:  Oral Oral Oral  SpO2: 93% 98% 95% 97%    General: Pt is alert, awake, not in acute distress Cardiovascular: RRR, S1/S2 +, no rubs, no gallops Respiratory: CTA bilaterally, no  wheezing, no rhonchi Abdominal: Soft, NT, ND, bowel sounds + Extremities: no edema, no cyanosis    The results of significant diagnostics from this hospitalization (including imaging, microbiology, ancillary and laboratory) are listed below for reference.     Microbiology: Recent Results (from the past 240 hour(s))  Resp Panel by RT-PCR (Flu A&B, Covid) Nasopharyngeal Swab     Status: None   Collection Time: 10/11/20  4:42 PM   Specimen: Nasopharyngeal Swab; Nasopharyngeal(NP) swabs in vial transport medium  Result Value Ref Range Status   SARS Coronavirus 2 by RT PCR NEGATIVE NEGATIVE Final    Comment: (NOTE) SARS-CoV-2 target nucleic acids are NOT DETECTED.  The SARS-CoV-2 RNA is generally detectable in upper respiratory specimens during the acute phase of infection. The lowest concentration of SARS-CoV-2 viral copies this assay can detect is 138 copies/mL. A negative result does not preclude SARS-Cov-2 infection and should not be used as the sole basis for treatment or other patient management decisions. A negative result may occur with  improper specimen collection/handling, submission of specimen  other than nasopharyngeal swab, presence of viral mutation(s) within the areas targeted by this assay, and inadequate number of viral copies(<138 copies/mL). A negative result must be combined with clinical observations, patient history, and epidemiological information. The expected result is Negative.  Fact Sheet for Patients:  EntrepreneurPulse.com.au  Fact Sheet for Healthcare Providers:  IncredibleEmployment.be  This test is no t yet approved or cleared by the [place] FDA and  has been authorized for detection and/or diagnosis of SARS-CoV-2 by FDA under an Emergency Use Authorization (EUA). This EUA will remain  in effect (meaning this test can be used) for the duration of the COVID-19 declaration under Section 564(b)(1) of the Act, 21 U.S.C.section 360bbb-3(b)(1), unless the authorization is terminated  or revoked sooner.       Influenza A by PCR NEGATIVE NEGATIVE Final   Influenza B by PCR NEGATIVE NEGATIVE Final    Comment: (NOTE) The Xpert Xpress SARS-CoV-2/FLU/RSV plus assay is intended as an aid in the diagnosis of influenza from Nasopharyngeal swab specimens and should not be used as a sole basis for treatment. Nasal washings and aspirates are unacceptable for Xpert Xpress SARS-CoV-2/FLU/RSV testing.  Fact Sheet for Patients: EntrepreneurPulse.com.au  Fact Sheet for Healthcare Providers: IncredibleEmployment.be  This test is not yet approved or cleared by the [place] FDA and has been authorized for detection and/or diagnosis of SARS-CoV-2 by FDA under an Emergency Use Authorization (EUA). This EUA will remain in effect (meaning this test can be used) for the duration of the COVID-19 declaration under Section 564(b)(1) of the Act, 21 U.S.C. section 360bbb-3(b)(1), unless the authorization is terminated or revoked.  Performed at [facility], [place] [address] [place],  [place] [address]      Labs: BNP (last 3 results) Recent Labs    10/11/20 1642  BNP 60.4   Basic Metabolic Panel: Recent Labs  Lab 10/11/20 1642 10/11/20 2037 10/12/20 0108  NA 131* 133* 134*  K 3.6 4.0 4.4  CL 96*  --  100  CO2 25  --  22  GLUCOSE 163*  --  161*  BUN 12  --  11  CREATININE 0.83  --  0.96  CALCIUM 9.2  --  9.2   Liver Function Tests: Recent Labs  Lab 10/11/20 1642  AST 26  ALT 22  ALKPHOS 65  BILITOT 1.0  PROT 6.4*  ALBUMIN 3.8   No results for input(s): LIPASE, AMYLASE in  the last 168 hours. No results for input(s): AMMONIA in the last 168 hours. CBC: Recent Labs  Lab 10/11/20 1642 10/11/20 2037 10/12/20 0108  WBC 11.5*  --  8.2  NEUTROABS 7.9*  --   --   HGB 15.2* 14.3 14.1  HCT 45.8 42.0 42.3  MCV 94.6  --  94.4  PLT 338  --  301   Cardiac Enzymes: No results for input(s): CKTOTAL, CKMB, CKMBINDEX, TROPONINI in the last 168 hours. BNP: Invalid input(s): POCBNP CBG: No results for input(s): GLUCAP in the last 168 hours. D-Dimer No results for input(s): DDIMER in the last 72 hours. Hgb A1c No results for input(s): HGBA1C in the last 72 hours. Lipid Profile No results for input(s): CHOL, HDL, LDLCALC, TRIG, CHOLHDL, LDLDIRECT in the last 72 hours. Thyroid function studies No results for input(s): TSH, T4TOTAL, T3FREE, THYROIDAB in the last 72 hours.  Invalid input(s): FREET3 Anemia work up No results for input(s): VITAMINB12, FOLATE, FERRITIN, TIBC, IRON, RETICCTPCT in the last 72 hours. Urinalysis    Component Value Date/Time   COLORURINE YELLOW 04/25/2020 1536   APPEARANCEUR CLEAR 04/25/2020 1536   LABSPEC 1.011 04/25/2020 1536   PHURINE 6.0 04/25/2020 1536   GLUCOSEU NEGATIVE 04/25/2020 1536   HGBUR NEGATIVE 04/25/2020 1536   [place] 04/25/2020 1536   KETONESUR 5 (A) 04/25/2020 1536   PROTEINUR NEGATIVE 04/25/2020 1536   NITRITE NEGATIVE 04/25/2020 1536   LEUKOCYTESUR LARGE (A) 04/25/2020 1536   Sepsis  Labs Invalid input(s): PROCALCITONIN,  WBC,  LACTICIDVEN Microbiology Recent Results (from the past 240 hour(s))  Resp Panel by RT-PCR (Flu A&B, Covid) Nasopharyngeal Swab     Status: None   Collection Time: 10/11/20  4:42 PM   Specimen: Nasopharyngeal Swab; Nasopharyngeal(NP) swabs in vial transport medium  Result Value Ref Range Status   SARS Coronavirus 2 by RT PCR NEGATIVE NEGATIVE Final    Comment: (NOTE) SARS-CoV-2 target nucleic acids are NOT DETECTED.  The SARS-CoV-2 RNA is generally detectable in upper respiratory specimens during the acute phase of infection. The lowest concentration of SARS-CoV-2 viral copies this assay can detect is 138 copies/mL. A negative result does not preclude SARS-Cov-2 infection and should not be used as the sole basis for treatment or other patient management decisions. A negative result may occur with  improper specimen collection/handling, submission of specimen other than nasopharyngeal swab, presence of viral mutation(s) within the areas targeted by this assay, and inadequate number of viral copies(<138 copies/mL). A negative result must be combined with clinical observations, patient history, and epidemiological information. The expected result is Negative.  Fact Sheet for Patients:  EntrepreneurPulse.com.au  Fact Sheet for Healthcare Providers:  IncredibleEmployment.be  This test is no t yet approved or cleared by the [place] FDA and  has been authorized for detection and/or diagnosis of SARS-CoV-2 by FDA under an Emergency Use Authorization (EUA). This EUA will remain  in effect (meaning this test can be used) for the duration of the COVID-19 declaration under Section 564(b)(1) of the Act, 21 U.S.C.section 360bbb-3(b)(1), unless the authorization is terminated  or revoked sooner.       Influenza A by PCR NEGATIVE NEGATIVE Final   Influenza B by PCR NEGATIVE NEGATIVE Final    Comment:  (NOTE) The Xpert Xpress SARS-CoV-2/FLU/RSV plus assay is intended as an aid in the diagnosis of influenza from Nasopharyngeal swab specimens and should not be used as a sole basis for treatment. Nasal washings and aspirates are unacceptable for Xpert Xpress SARS-CoV-2/FLU/RSV testing.  Fact Sheet for Patients: EntrepreneurPulse.com.au  Fact Sheet for Healthcare Providers: IncredibleEmployment.be  This test is not yet approved or cleared by the [place] FDA and has been authorized for detection and/or diagnosis of SARS-CoV-2 by FDA under an Emergency Use Authorization (EUA). This EUA will remain in effect (meaning this test can be used) for the duration of the COVID-19 declaration under Section 564(b)(1) of the Act, 21 U.S.C. section 360bbb-3(b)(1), unless the authorization is terminated or revoked.  Performed at [facility], [place] [address], [place], [place] [address]      Time coordinating discharge: Over 30 minutes  SIGNED:   Charlynne Cousins, MD  [facility] 10/12/2020, 7:14 AM Pager   If 7PM-7AM, please contact night-coverage [email or web address] Password [email or web address]

## 2020-10-12 NOTE — Progress Notes (Signed)
Discharge orders acknowledged, patient must ambulate while checking o2 saturation before leaving. Called NT to assist.

## 2020-10-19 ENCOUNTER — Emergency Department (HOSPITAL_COMMUNITY): Payer: Medicare Other

## 2020-10-19 ENCOUNTER — Observation Stay (HOSPITAL_COMMUNITY): Payer: Medicare Other

## 2020-10-19 ENCOUNTER — Encounter (HOSPITAL_COMMUNITY): Payer: Self-pay | Admitting: *Deleted

## 2020-10-19 ENCOUNTER — Other Ambulatory Visit: Payer: Self-pay

## 2020-10-19 ENCOUNTER — Inpatient Hospital Stay (HOSPITAL_COMMUNITY)
Admission: EM | Admit: 2020-10-19 | Discharge: 2020-10-22 | DRG: 064 | Disposition: A | Discharge status: Home / Self Care | Payer: Medicare Other | Attending: Internal Medicine | Admitting: Internal Medicine

## 2020-10-19 DIAGNOSIS — R29702 NIHSS score 2: Secondary | ICD-10-CM | POA: Diagnosis present

## 2020-10-19 DIAGNOSIS — I5032 Chronic diastolic (congestive) heart failure: Secondary | ICD-10-CM | POA: Diagnosis present

## 2020-10-19 DIAGNOSIS — R918 Other nonspecific abnormal finding of lung field: Secondary | ICD-10-CM | POA: Diagnosis present

## 2020-10-19 DIAGNOSIS — R42 Dizziness and giddiness: Secondary | ICD-10-CM

## 2020-10-19 DIAGNOSIS — E785 Hyperlipidemia, unspecified: Secondary | ICD-10-CM | POA: Diagnosis present

## 2020-10-19 DIAGNOSIS — K219 Gastro-esophageal reflux disease without esophagitis: Secondary | ICD-10-CM | POA: Diagnosis present

## 2020-10-19 DIAGNOSIS — I10 Essential (primary) hypertension: Secondary | ICD-10-CM | POA: Diagnosis present

## 2020-10-19 DIAGNOSIS — R569 Unspecified convulsions: Secondary | ICD-10-CM

## 2020-10-19 DIAGNOSIS — F419 Anxiety disorder, unspecified: Secondary | ICD-10-CM | POA: Diagnosis present

## 2020-10-19 DIAGNOSIS — C519 Malignant neoplasm of vulva, unspecified: Secondary | ICD-10-CM | POA: Diagnosis present

## 2020-10-19 DIAGNOSIS — L89321 Pressure ulcer of left buttock, stage 1: Secondary | ICD-10-CM | POA: Diagnosis present

## 2020-10-19 DIAGNOSIS — I951 Orthostatic hypotension: Secondary | ICD-10-CM | POA: Diagnosis present

## 2020-10-19 DIAGNOSIS — Z96641 Presence of right artificial hip joint: Secondary | ICD-10-CM | POA: Diagnosis present

## 2020-10-19 DIAGNOSIS — Z881 Allergy status to other antibiotic agents status: Secondary | ICD-10-CM

## 2020-10-19 DIAGNOSIS — L89311 Pressure ulcer of right buttock, stage 1: Secondary | ICD-10-CM | POA: Diagnosis present

## 2020-10-19 DIAGNOSIS — R2990 Unspecified symptoms and signs involving the nervous system: Secondary | ICD-10-CM | POA: Diagnosis not present

## 2020-10-19 DIAGNOSIS — Z87891 Personal history of nicotine dependence: Secondary | ICD-10-CM

## 2020-10-19 DIAGNOSIS — H5500 Unspecified nystagmus: Secondary | ICD-10-CM | POA: Diagnosis present

## 2020-10-19 DIAGNOSIS — I6340 Cerebral infarction due to embolism of unspecified cerebral artery: Secondary | ICD-10-CM | POA: Diagnosis not present

## 2020-10-19 DIAGNOSIS — Z66 Do not resuscitate: Secondary | ICD-10-CM | POA: Diagnosis not present

## 2020-10-19 DIAGNOSIS — J449 Chronic obstructive pulmonary disease, unspecified: Secondary | ICD-10-CM | POA: Diagnosis present

## 2020-10-19 DIAGNOSIS — Z20822 Contact with and (suspected) exposure to covid-19: Secondary | ICD-10-CM | POA: Diagnosis present

## 2020-10-19 DIAGNOSIS — R112 Nausea with vomiting, unspecified: Secondary | ICD-10-CM | POA: Diagnosis present

## 2020-10-19 DIAGNOSIS — Z7952 Long term (current) use of systemic steroids: Secondary | ICD-10-CM

## 2020-10-19 DIAGNOSIS — I69354 Hemiplegia and hemiparesis following cerebral infarction affecting left non-dominant side: Secondary | ICD-10-CM

## 2020-10-19 DIAGNOSIS — Z79899 Other long term (current) drug therapy: Secondary | ICD-10-CM

## 2020-10-19 DIAGNOSIS — I110 Hypertensive heart disease with heart failure: Secondary | ICD-10-CM | POA: Diagnosis present

## 2020-10-19 DIAGNOSIS — F32A Depression, unspecified: Secondary | ICD-10-CM | POA: Diagnosis present

## 2020-10-19 DIAGNOSIS — E860 Dehydration: Secondary | ICD-10-CM | POA: Diagnosis present

## 2020-10-19 DIAGNOSIS — Z515 Encounter for palliative care: Secondary | ICD-10-CM

## 2020-10-19 DIAGNOSIS — I480 Paroxysmal atrial fibrillation: Secondary | ICD-10-CM | POA: Diagnosis present

## 2020-10-19 DIAGNOSIS — Z7951 Long term (current) use of inhaled steroids: Secondary | ICD-10-CM

## 2020-10-19 DIAGNOSIS — R2681 Unsteadiness on feet: Secondary | ICD-10-CM | POA: Diagnosis present

## 2020-10-19 DIAGNOSIS — D72829 Elevated white blood cell count, unspecified: Secondary | ICD-10-CM | POA: Diagnosis present

## 2020-10-19 DIAGNOSIS — I6783 Posterior reversible encephalopathy syndrome: Secondary | ICD-10-CM | POA: Diagnosis present

## 2020-10-19 DIAGNOSIS — Z7982 Long term (current) use of aspirin: Secondary | ICD-10-CM

## 2020-10-19 DIAGNOSIS — Z91048 Other nonmedicinal substance allergy status: Secondary | ICD-10-CM

## 2020-10-19 LAB — DIFFERENTIAL
Abs Immature Granulocytes: 0.1 10*3/uL — ABNORMAL HIGH (ref 0.00–0.07)
Basophils Absolute: 0.1 10*3/uL (ref 0.0–0.1)
Basophils Relative: 0 %
Eosinophils Absolute: 0 10*3/uL (ref 0.0–0.5)
Eosinophils Relative: 0 %
Immature Granulocytes: 1 %
Lymphocytes Relative: 9 %
Lymphs Abs: 1.5 10*3/uL (ref 0.7–4.0)
Monocytes Absolute: 1.1 10*3/uL — ABNORMAL HIGH (ref 0.1–1.0)
Monocytes Relative: 6 %
Neutro Abs: 14.7 10*3/uL — ABNORMAL HIGH (ref 1.7–7.7)
Neutrophils Relative %: 84 %

## 2020-10-19 LAB — ETHANOL: Alcohol, Ethyl (B): <10 mg/dL (ref <10)

## 2020-10-19 LAB — CBC
HCT: 43.4 % (ref 36.0–46.0)
Hemoglobin: 15.1 g/dL — ABNORMAL HIGH (ref 12.0–15.0)
MCH: 32.5 pg (ref 26.0–34.0)
MCHC: 34.8 g/dL (ref 30.0–36.0)
MCV: 93.3 fL (ref 80.0–100.0)
Platelets: 292 10*3/uL (ref 150–400)
RBC: 4.65 MIL/uL (ref 3.87–5.11)
RDW: 12.1 % (ref 11.5–15.5)
WBC: 17.4 10*3/uL — ABNORMAL HIGH (ref 4.0–10.5)
nRBC: 0 % (ref 0.0–0.2)

## 2020-10-19 LAB — URINALYSIS, ROUTINE W REFLEX MICROSCOPIC
Bilirubin Urine: NEGATIVE
Glucose, UA: NEGATIVE mg/dL
Ketones, ur: NEGATIVE mg/dL
Nitrite: NEGATIVE
Protein, ur: NEGATIVE mg/dL
Specific Gravity, Urine: 1.03 (ref 1.005–1.030)
pH: 9 — ABNORMAL HIGH (ref 5.0–8.0)

## 2020-10-19 LAB — COMPREHENSIVE METABOLIC PANEL
ALT: 20 U/L (ref 0–44)
AST: 25 U/L (ref 15–41)
Albumin: 3.8 g/dL (ref 3.5–5.0)
Alkaline Phosphatase: 78 U/L (ref 38–126)
Anion gap: 10 (ref 5–15)
BUN: 7 mg/dL — ABNORMAL LOW (ref 8–23)
CO2: 27 mmol/L (ref 22–32)
Calcium: 9.2 mg/dL (ref 8.9–10.3)
Chloride: 95 mmol/L — ABNORMAL LOW (ref 98–111)
Creatinine, Ser: 0.84 mg/dL (ref 0.44–1.00)
GFR, Estimated: >60 mL/min (ref >60)
Glucose, Bld: 104 mg/dL — ABNORMAL HIGH (ref 70–99)
Potassium: 4 mmol/L (ref 3.5–5.1)
Sodium: 132 mmol/L — ABNORMAL LOW (ref 135–145)
Total Bilirubin: 1.4 mg/dL — ABNORMAL HIGH (ref 0.3–1.2)
Total Protein: 6.6 g/dL (ref 6.5–8.1)

## 2020-10-19 LAB — I-STAT CHEM 8, ED
BUN: 6 mg/dL — ABNORMAL LOW (ref 8–23)
Calcium, Ion: 1.15 mmol/L (ref 1.15–1.40)
Chloride: 96 mmol/L — ABNORMAL LOW (ref 98–111)
Creatinine, Ser: 0.7 mg/dL (ref 0.44–1.00)
Glucose, Bld: 102 mg/dL — ABNORMAL HIGH (ref 70–99)
HCT: 44 % (ref 36.0–46.0)
Hemoglobin: 15 g/dL (ref 12.0–15.0)
Potassium: 4 mmol/L (ref 3.5–5.1)
Sodium: 131 mmol/L — ABNORMAL LOW (ref 135–145)
TCO2: 26 mmol/L (ref 22–32)

## 2020-10-19 LAB — RESP PANEL BY RT-PCR (FLU A&B, COVID) ARPGX2
Influenza A by PCR: NEGATIVE
Influenza B by PCR: NEGATIVE
SARS Coronavirus 2 by RT PCR: NEGATIVE

## 2020-10-19 LAB — PROTIME-INR
INR: 0.9 (ref 0.8–1.2)
Prothrombin Time: 12.1 seconds (ref 11.4–15.2)

## 2020-10-19 LAB — RAPID URINE DRUG SCREEN, HOSP PERFORMED
Amphetamines: NOT DETECTED
Barbiturates: NOT DETECTED
Benzodiazepines: NOT DETECTED
Cocaine: NOT DETECTED
Opiates: NOT DETECTED
Tetrahydrocannabinol: NOT DETECTED

## 2020-10-19 LAB — APTT: aPTT: 29 seconds (ref 24–36)

## 2020-10-19 MED ORDER — TIOTROPIUM BROMIDE MONOHYDRATE 18 MCG IN CAPS: 18.0000 ug | ORAL_CAPSULE | Freq: Every day | RESPIRATORY_TRACT | Status: DC

## 2020-10-19 MED ORDER — METOPROLOL TARTRATE 50 MG PO TABS: 50.0000 mg | ORAL_TABLET | Freq: Two times a day (BID) | ORAL | Status: DC

## 2020-10-19 MED ORDER — SODIUM CHLORIDE 0.9 % IV SOLN: INTRAVENOUS | Status: DC

## 2020-10-19 MED ORDER — FOLIC ACID 1 MG PO TABS
1.0000 mg | ORAL_TABLET | Freq: Every day | ORAL | Status: DC
  Administered 2020-10-20 – 2020-10-22 (×3): 1 mg via ORAL
  Filled 2020-10-19 (×3): qty 1

## 2020-10-19 MED ORDER — STROKE: EARLY STAGES OF RECOVERY BOOK
Freq: Once | Status: AC
  Filled 2020-10-19: qty 1

## 2020-10-19 MED ORDER — METOPROLOL TARTRATE 25 MG PO TABS: 50.0000 mg | ORAL_TABLET | Freq: Two times a day (BID) | ORAL | Status: DC

## 2020-10-19 MED ORDER — ADULT MULTIVITAMIN W/MINERALS CH
1.0000 | ORAL_TABLET | Freq: Every day | ORAL | Status: DC
  Administered 2020-10-20 – 2020-10-22 (×3): 1 via ORAL
  Filled 2020-10-19 (×3): qty 1

## 2020-10-19 MED ORDER — BUSPIRONE HCL 10 MG PO TABS
10.0000 mg | ORAL_TABLET | Freq: Two times a day (BID) | ORAL | Status: DC
  Administered 2020-10-19 – 2020-10-22 (×6): 10 mg via ORAL
  Filled 2020-10-19 (×6): qty 1

## 2020-10-19 MED ORDER — CLOPIDOGREL BISULFATE 75 MG PO TABS
75.0000 mg | ORAL_TABLET | Freq: Every day | ORAL | Status: DC
  Administered 2020-10-20 – 2020-10-22 (×3): 75 mg via ORAL
  Filled 2020-10-19 (×3): qty 1

## 2020-10-19 MED ORDER — ATORVASTATIN CALCIUM 10 MG PO TABS
20.0000 mg | ORAL_TABLET | Freq: Every day | ORAL | Status: DC
  Administered 2020-10-19: 20 mg via ORAL
  Filled 2020-10-19: qty 2

## 2020-10-19 MED ORDER — UMECLIDINIUM BROMIDE 62.5 MCG/INH IN AEPB
1.0000 | INHALATION_SPRAY | Freq: Every day | RESPIRATORY_TRACT | Status: DC
  Administered 2020-10-19 – 2020-10-22 (×3): 1 via RESPIRATORY_TRACT
  Filled 2020-10-19: qty 7

## 2020-10-19 MED ORDER — ASPIRIN EC 81 MG PO TBEC: 81.0000 mg | DELAYED_RELEASE_TABLET | Freq: Every day | ORAL | Status: DC

## 2020-10-19 MED ORDER — SENNOSIDES-DOCUSATE SODIUM 8.6-50 MG PO TABS
1.0000 | ORAL_TABLET | Freq: Every evening | ORAL | Status: DC | PRN
  Administered 2020-10-20: 1 via ORAL
  Filled 2020-10-19: qty 1

## 2020-10-19 MED ORDER — ACETAMINOPHEN 650 MG RE SUPP: 650.0000 mg | RECTAL | Status: DC | PRN

## 2020-10-19 MED ORDER — QUETIAPINE FUMARATE 200 MG PO TABS
200.0000 mg | ORAL_TABLET | Freq: Every day | ORAL | Status: DC
  Administered 2020-10-19 – 2020-10-21 (×3): 200 mg via ORAL
  Filled 2020-10-19 (×3): qty 1

## 2020-10-19 MED ORDER — MOMETASONE FURO-FORMOTEROL FUM 200-5 MCG/ACT IN AERO
2.0000 | INHALATION_SPRAY | Freq: Two times a day (BID) | RESPIRATORY_TRACT | Status: DC
  Administered 2020-10-19 – 2020-10-22 (×6): 2 via RESPIRATORY_TRACT
  Filled 2020-10-19: qty 8.8

## 2020-10-19 MED ORDER — CLOPIDOGREL BISULFATE 75 MG PO TABS
75.0000 mg | ORAL_TABLET | Freq: Once | ORAL | Status: AC
  Administered 2020-10-19: 75 mg via ORAL
  Filled 2020-10-19: qty 1

## 2020-10-19 MED ORDER — ASPIRIN EC 81 MG PO TBEC
81.0000 mg | DELAYED_RELEASE_TABLET | Freq: Every day | ORAL | Status: DC
  Administered 2020-10-20 – 2020-10-22 (×3): 81 mg via ORAL
  Filled 2020-10-19 (×3): qty 1

## 2020-10-19 MED ORDER — MECLIZINE HCL 12.5 MG PO TABS
25.0000 mg | ORAL_TABLET | Freq: Two times a day (BID) | ORAL | Status: AC
  Administered 2020-10-19 – 2020-10-20 (×2): 25 mg via ORAL
  Filled 2020-10-19: qty 1
  Filled 2020-10-19: qty 2

## 2020-10-19 MED ORDER — IOHEXOL 350 MG/ML SOLN
100.0000 mL | Freq: Once | INTRAVENOUS | Status: AC | PRN
  Administered 2020-10-19: 100 mL via INTRAVENOUS

## 2020-10-19 MED ORDER — ACETAMINOPHEN 160 MG/5ML PO SOLN: 650.0000 mg | ORAL | Status: DC | PRN

## 2020-10-19 MED ORDER — FERROUS SULFATE 325 (65 FE) MG PO TABS
325.0000 mg | ORAL_TABLET | Freq: Two times a day (BID) | ORAL | Status: DC
  Administered 2020-10-20 – 2020-10-22 (×5): 325 mg via ORAL
  Filled 2020-10-19 (×5): qty 1

## 2020-10-19 MED ORDER — ACETAMINOPHEN 325 MG PO TABS
650.0000 mg | ORAL_TABLET | ORAL | Status: DC | PRN
  Administered 2020-10-19 – 2020-10-20 (×3): 650 mg via ORAL
  Filled 2020-10-19 (×3): qty 2

## 2020-10-19 MED ORDER — ALBUTEROL SULFATE HFA 108 (90 BASE) MCG/ACT IN AERS: 2.0000 | INHALATION_SPRAY | RESPIRATORY_TRACT | Status: DC | PRN

## 2020-10-19 MED ORDER — SODIUM CHLORIDE 0.9 % IV SOLN
INTRAVENOUS | Status: DC
Start: 2020-10-19 — End: 2020-10-20

## 2020-10-19 MED ORDER — OLANZAPINE 2.5 MG PO TABS
2.5000 mg | ORAL_TABLET | Freq: Every day | ORAL | Status: DC
  Administered 2020-10-19 – 2020-10-21 (×3): 2.5 mg via ORAL
  Filled 2020-10-19 (×3): qty 1

## 2020-10-19 MED ORDER — THIAMINE HCL 100 MG PO TABS
100.0000 mg | ORAL_TABLET | Freq: Every day | ORAL | Status: DC
  Administered 2020-10-20 – 2020-10-22 (×3): 100 mg via ORAL
  Filled 2020-10-19 (×3): qty 1

## 2020-10-19 MED ORDER — ALBUTEROL SULFATE (2.5 MG/3ML) 0.083% IN NEBU: 2.5000 mg | INHALATION_SOLUTION | RESPIRATORY_TRACT | Status: DC | PRN

## 2020-10-19 MED ORDER — ASPIRIN 325 MG PO TABS
325.0000 mg | ORAL_TABLET | Freq: Once | ORAL | Status: AC
  Administered 2020-10-19: 325 mg via ORAL
  Filled 2020-10-19: qty 1

## 2020-10-19 MED ORDER — MECLIZINE HCL 12.5 MG PO TABS: 25.0000 mg | ORAL_TABLET | Freq: Three times a day (TID) | ORAL | Status: DC | PRN

## 2020-10-19 NOTE — ED Notes (Signed)
Kim Blankenship daughter [phone] would like an update

## 2020-10-19 NOTE — ED Provider Notes (Signed)
Emergency Medicine Provider Triage Evaluation Note  Kim Blankenship , a 75 y.o. female  was evaluated in triage.  Pt complains of patient presents with dizziness and feeling off balance, this started 3 to 4 hours ago, she states that she woke up this morning around 830, felt fine few hours afterwards around 1030 she did not feel well,  states she has extreme dizziness, and cannot stand.  She denies recent head trauma, is not on anticoagulants, she states she has never had this in the past.  She also endorses that she has a headache, she feels in the front of her head as well as the back of her head states his headache came on suddenly.  She has no other complaints at this time.  Review of Systems  Positive: Headaches, dizziness, off-balance Negative: Chest pain, shortness of breath  Physical Exam  BP (!) 154/70   Pulse 91   Temp 99.6 F (37.6 C) (Oral)   Resp 18   SpO2 99%  Gen:   Awake, no distress   Resp:  Normal effort  MSK:   Moves extremities without difficulty  Other:  No facial asymmetry, no word slurring, no unilateral weakness, able to follow commands.  Abnormal gait, unable to ambulate due to dizziness.  Medical Decision Making  Medically screening exam initiated at 1:21 PM.  Appropriate orders placed.  Lavone Neri was informed that the remainder of the evaluation will be completed by another provider, this initial triage assessment does not replace that evaluation, and the importance of remaining in the ED until their evaluation is complete.  Patient presents with severe dizziness and off-balance, lab work and imaging have been ordered, culture has been activated.  Patient will need further work-up.   Marcello Fennel, PA-C 10/19/20 1324    Tegeler, Gwenyth Allegra, MD 10/19/20 [phone]

## 2020-10-19 NOTE — Code Documentation (Addendum)
Stroke Response Nurse Documentation Code Stroke Documentation Kim Blankenship  is a 75 y.o.  y.o. female  arriving to [facility] ED via [facility] on 10/19/2020 with PMH of a-fib, HTN, Cervical  CA, COPD, PRES, CVA. Code stroke was activated by ED. Patient from home where she was LKW at 2000 the night before and now complaining of dizziness and feeling off balance. Patient taking aspirin 81 mg daily PTA. Stroke team at the bedside on patient arrival, CBG 102, labs drawn and patient cleared for CT by Dr. Ileene Patrick. Patient taken to CT with team. NIHSS 2, see documentation for details and code stroke times. Patient with left limb ataxia and left decreased sensation on exam, symptoms later resolved on re-assessment. The following imaging was completed:  CT, CTA head and neck. Patient is not a candidate for tPA due to being outside of treatment window, not a candidate for NIR. Care/Plan: q2h VS/mNIHSS. Bedside handoff with ED RN Marcie Bal.    Lenore Manner  Stroke Response RN [phone]

## 2020-10-19 NOTE — ED Triage Notes (Signed)
Pt reports not feeling well last night, generalized fatigue and weakness. Reports dizziness today x 4-5 hours with nausea. Has generalized weakness and reports intermittent ankle swelling x 6 months with sob. Airway intact at triage.

## 2020-10-19 NOTE — ED Notes (Signed)
Pt transported to MRI 

## 2020-10-19 NOTE — ED Provider Notes (Signed)
[facility] EMERGENCY DEPARTMENT Provider Note   CSN: [id number] Arrival date & time: 10/19/20  1224     History Chief Complaint  Patient presents with   Dizziness   Nausea    Kim Blankenship is a 75 y.o. female.  HPI 75 year old female history of A. fib presents today with sudden onset of vertigo.  Patient reported at triage that she felt somewhat generalize malaise since last night.  Continue to feel that way this morning.  Between 10 and 11 she had an onset of vertigo that is associated nausea and vomiting     Past Medical History:  Diagnosis Date   Asthma    Atrial fibrillation ([place])    Cancer ([place])    cervical   COPD (chronic obstructive pulmonary disease) ([place])    Hypertension    Irregular heart rate    PRES (posterior reversible encephalopathy syndrome)    Stroke [facility])     Patient Active Problem List   Diagnosis Date Noted   Pressure injury of skin 10/12/2020   COPD exacerbation ([place]) 10/11/2020   History of stroke 57/26/2035   Acute metabolic encephalopathy 59/74/1638   Acute respiratory failure with hypoxia ([place]) 07/23/2019   Acute encephalopathy 07/22/2019   Overdose opiate, accidental or unintentional, initial encounter ([place]) 07/22/2019   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   Delirium    GIB (gastrointestinal bleeding) 12/04/2018   Chronic diastolic CHF (congestive heart failure) ([place]) 12/04/2018   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   Stroke (cerebrum) ([place]) 11/21/2016   Hyponatremia 11/20/2016   UTI (urinary tract infection) 11/20/2016   HLD (hyperlipidemia) 11/20/2016   GERD (gastroesophageal reflux disease) 11/20/2016   Depression 11/20/2016   Hypertensive emergency 11/04/2016   EtOH dependence ([place]) 11/04/2016   Hyperglycemia 11/04/2016   Essential hypertension    Hypertensive urgency 10/30/2016   PAF (paroxysmal atrial fibrillation) ([place]) 10/30/2016   PRES (posterior reversible  encephalopathy syndrome) 10/30/2016   Seizure ([place])    Acute blood loss anemia    Closed fracture of right hip ([place]) 02/09/2016   Chest pain 11/07/2015   COPD (chronic obstructive pulmonary disease) ([place]) 08/19/2013   Abnormal EKG 08/19/2013   Fever, unspecified 08/19/2013    Past Surgical History:  Procedure Laterality Date   CESAREAN SECTION     Left Foot Reconstruction     OPEN REDUCTION INTERNAL FIXATION (ORIF) METACARPAL Right 03/10/2015   Procedure: OPEN REDUCTION INTERNAL FIXATION (ORIF) RIGHT LONG  METACARPAL FRACTURE;  Surgeon: Leanora Cover, MD;  Location: [place];  Service: Orthopedics;  Laterality: Right;   TONSILLECTOMY     TOTAL HIP ARTHROPLASTY Right 02/09/2016   Procedure: TOTAL HIP ARTHROPLASTY ANTERIOR APPROACH;  Surgeon: Frederik Pear, MD;  Location: [facility];  Service: Orthopedics;  Laterality: Right;     OB History     Gravida      Para      Term      Preterm      AB      Living  1      SAB      IAB      Ectopic      Multiple      Live Births              Family History  Problem Relation Age of Onset   Dementia Father     Social History   Tobacco Use   Smoking status: Former    Packs/day: 2.00  Years: 30.00    Pack years: 60.00    Types: Cigarettes    Quit date: 04/1995    Years since quitting: 25.5   Smokeless tobacco: Never  Vaping Use   Vaping Use: Never used  Substance Use Topics   Alcohol use: Yes   Drug use: No    Home Medications Prior to Admission medications   Medication Sig Start Date End Date Taking? Authorizing Provider  albuterol (VENTOLIN HFA) 108 (90 Base) MCG/ACT inhaler Inhale 2 puffs into the lungs every 4 (four) hours as needed for wheezing or shortness of breath. 09/30/20   Isla Pence, MD  amLODipine (NORVASC) 2.5 MG tablet Take 2.5 mg by mouth daily.    [provider]  aspirin EC 81 MG tablet Take 81 mg by mouth daily.    [provider]  atorvastatin (LIPITOR)  20 MG tablet Take 20 mg by mouth daily. 04/08/16   [provider]  busPIRone (BUSPAR) 10 MG tablet Take 10 mg by mouth 2 (two) times daily. 01/29/20   [provider]  calcium carbonate (TUMS - DOSED IN MG ELEMENTAL CALCIUM) 500 MG chewable tablet Chew 1 tablet by mouth daily as needed for indigestion or heartburn.    [provider]  chlorpheniramine-HYDROcodone (TUSSIONEX PENNKINETIC ER) 10-8 MG/5ML SUER Take 5 mLs by mouth every 12 (twelve) hours as needed for cough. 09/30/20   Isla Pence, MD  doxycycline (VIBRAMYCIN) 100 MG capsule Take 1 capsule (100 mg total) by mouth 2 (two) times daily. 09/30/20   Isla Pence, MD  ferrous sulfate 325 (65 FE) MG tablet Take 1 tablet (325 mg total) by mouth 2 (two) times daily with a meal. 12/10/18   Hongalgi, Lenis Dickinson, MD  folic acid (FOLVITE) 1 MG tablet Take 1 tablet (1 mg total) by mouth daily. 12/11/18   Hongalgi, Lenis Dickinson, MD  Ipratropium-Albuterol (COMBIVENT RESPIMAT) 20-100 MCG/ACT AERS respimat Inhale 1 puff into the lungs every 6 (six) hours. 09/30/20   Isla Pence, MD  metoprolol tartrate (LOPRESSOR) 50 MG tablet Take 50 mg by mouth 2 (two) times daily. 01/29/20   [provider]  mometasone-formoterol (DULERA) 200-5 MCG/ACT AERO Inhale 2 puffs into the lungs 2 (two) times daily. 10/12/20   Charlynne Cousins, MD  Multiple Vitamin (MULTIVITAMIN WITH MINERALS) TABS tablet Take 1 tablet by mouth daily. 12/11/18   Hongalgi, Lenis Dickinson, MD  OLANZapine (ZYPREXA) 2.5 MG tablet Take 2.5 mg by mouth at bedtime.    [provider]  predniSONE (DELTASONE) 10 MG tablet Takes 6 tablets for 1 days, then 5 tablets for 1 days, then 4 tablets for 1 days, then 3 tablets for 1 days, then 2 tabs for 1 days, then 1 tab for 1 days, and then stop. 10/12/20   Charlynne Cousins, MD  QUEtiapine (SEROQUEL) 100 MG tablet Take 200 mg by mouth at bedtime.     [provider]  spironolactone (ALDACTONE) 25 MG tablet Take 25 mg  by mouth daily.    [provider]  thiamine 100 MG tablet Take 1 tablet (100 mg total) by mouth daily. 12/11/18   Hongalgi, Lenis Dickinson, MD  tiotropium (SPIRIVA HANDIHALER) 18 MCG inhalation capsule Place 1 capsule (18 mcg total) into inhaler and inhale daily. 10/12/20 10/12/21  Charlynne Cousins, MD  tiZANidine (ZANAFLEX) 2 MG tablet Take 2 mg by mouth at bedtime.    [provider]    Allergies    Levofloxacin and Tape  Review of Systems  Review of Systems  All other systems reviewed and are negative.  Physical Exam Updated Vital Signs BP (!) 154/70   Pulse 91   Temp 99.6 F (37.6 C) (Oral)   Resp 18   SpO2 99%   Physical Exam Vitals and nursing note reviewed.  Constitutional:      General: She is not in acute distress.    Appearance: Normal appearance. She is normal weight.  HENT:     Head: Normocephalic.     Right Ear: External ear normal.     Left Ear: External ear normal.     Nose: Nose normal.     Mouth/Throat:     Pharynx: Oropharynx is clear.  Eyes:     Extraocular Movements: Extraocular movements intact.     Pupils: Pupils are equal, round, and reactive to light.  Cardiovascular:     Rate and Rhythm: Normal rate. Rhythm irregular.  Pulmonary:     Effort: Pulmonary effort is normal.     Breath sounds: Normal breath sounds.  Abdominal:     General: Abdomen is flat.     Palpations: Abdomen is soft.  Musculoskeletal:     Cervical back: Normal range of motion.  Skin:    General: Skin is warm and dry.     Comments: Abrasion back and lower leg  Neurological:     General: No focal deficit present.     Mental Status: She is alert.     Cranial Nerves: No cranial nerve deficit.     Sensory: No sensory deficit.     Motor: No weakness.     Coordination: Coordination normal.     Deep Tendon Reflexes: Reflexes normal.  Psychiatric:        Mood and Affect: Mood normal.    ED Results / Procedures / Treatments   Labs (all labs ordered are  listed, but only abnormal results are displayed) Labs Reviewed  RESP PANEL BY RT-PCR (FLU A&B, COVID) ARPGX2  ETHANOL  PROTIME-INR  APTT  CBC  DIFFERENTIAL  COMPREHENSIVE METABOLIC PANEL  RAPID URINE DRUG SCREEN, HOSP PERFORMED  URINALYSIS, ROUTINE W REFLEX MICROSCOPIC  I-STAT CHEM 8, ED    EKG EKG Interpretation  Date/Time:  Thursday October 19 2020 12:51:02 EDT Ventricular Rate:  92 PR Interval:  116 QRS Duration: 90 QT Interval:  344 QTC Calculation: 425 R Axis:   79 Text Interpretation: Sinus rhythm with Premature atrial complexes with Abberant conduction Nonspecific ST abnormality Abnormal QRS-T angle, consider primary T wave abnormality Abnormal ECG Confirmed by Pattricia Boss [phone]) on 10/19/2020 2:53:46 PM  Radiology CT HEAD CODE STROKE WO CONTRAST  Result Date: 10/19/2020 CLINICAL DATA:  Code stroke. Neurological deficit. Acute stroke suspected. Vertigo. Last seen normal 1030 hours. EXAM: CT HEAD WITHOUT CONTRAST TECHNIQUE: Contiguous axial images were obtained from the base of the skull through the vertex without intravenous contrast. COMPARISON:  04/25/2020 FINDINGS: Brain: The study suffers from some motion degradation. No sign of acute infarction, mass lesion, hemorrhage, hydrocephalus or extra-axial collection. There are mild chronic small-vessel ischemic changes of the hemispheric white matter. Vascular: There is atherosclerotic calcification of the major vessels at the base of the brain. Skull: Negative Sinuses/Orbits: Fluid levels in both maxillary sinuses consistent with rhinosinusitis. Orbits negative. Other: None ASPECTS ([place] Stroke Program Early CT Score) - Ganglionic level infarction (caudate, lentiform nuclei, internal capsule, insula, M1-M3 cortex): 7 - Supraganglionic infarction (M4-M6 cortex): 3 Total score (0-10 with 10 being normal): 10 IMPRESSION: 1. No acute CT finding. Mild chronic  small-vessel ischemic change of the hemispheric white matter. 2. ASPECTS is  10. 3. These results were communicated to Dr. Quinn Axe At 1:35 pm on 10/19/2020 by text page via the [facility] messaging system. Electronically Signed   By: Nelson Chimes M.D.   On: 10/19/2020 13:37   CT ANGIO HEAD NECK W WO CM W PERF (CODE STROKE)  Result Date: 10/19/2020 CLINICAL DATA:  Code stroke. Neurological deficit. Vertigo. No acute CT finding. EXAM: CT ANGIOGRAPHY HEAD AND NECK CT PERFUSION BRAIN TECHNIQUE: Multidetector CT imaging of the head and neck was performed using the standard protocol during bolus administration of intravenous contrast. Multiplanar CT image reconstructions and MIPs were obtained to evaluate the vascular anatomy. Carotid stenosis measurements (when applicable) are obtained utilizing NASCET criteria, using the distal internal carotid diameter as the denominator. Multiphase CT imaging of the brain was performed following IV bolus contrast injection. Subsequent parametric perfusion maps were calculated using RAPID software. CONTRAST:  179mL OMNIPAQUE IOHEXOL 350 MG/ML SOLN COMPARISON:  Head CT earlier same day. FINDINGS: CTA NECK FINDINGS Aortic arch: Aortic atherosclerosis and tortuosity. Branching pattern is normal without origin stenosis. Right carotid system: Common carotid artery widely patent to the bifurcation. Calcified plaque at the ICA bulb and bifurcation but no stenosis. Cervical ICA is tortuous but widely patent. Left carotid system: Common carotid artery widely patent to the bifurcation. Calcified plaque at carotid bifurcation and ICA bulb but no stenosis. Cervical ICA is tortuous but widely patent. Vertebral arteries: Both vertebral artery origins patent. Some atherosclerotic calcification at the proximal right vertebral artery but no stenosis greater than 30%. Both vertebral arteries patent beyond that through the cervical region to the foramen magnum. Skeleton: Cervical spondylosis and facet osteoarthritis. Degenerative change at the C1-2 articulation on the right which  could cause craniocervical pain syndromes. Other neck: No neck mass or lymphadenopathy. Upper chest: Emphysema and pulmonary scarring. Mass lesion in the posterior aspect of the left upper lobe measuring 7 x 13 mm in size. This has enlarged considerably since May of 2021 and is worrisome for lung carcinoma. Review of the MIP images confirms the above findings CTA HEAD FINDINGS Anterior circulation: Both internal carotid arteries are patent through the skull base and siphon regions. There is ordinary siphon atherosclerotic calcification but no stenosis greater than 30% suspected. The anterior and middle cerebral vessels are patent. No large vessel occlusion. No correctable proximal stenosis. No aneurysm or vascular malformation. Posterior circulation: Both vertebral arteries widely patent through the foramen magnum to the basilar. No basilar stenosis. Posterior circulation branch vessels are normal. Venous sinuses: Patent and normal. Anatomic variants: None significant. Review of the MIP images confirms the above findings CT Brain Perfusion Findings: ASPECTS: 10 CBF (<30%) Volume: 23mL Perfusion (Tmax>6.0s) volume: 49mL Mismatch Volume: 0ML Infarction Location:None IMPRESSION: Negative CT perfusion study. Atherosclerotic calcification at both carotid bifurcation and ICA bulbs but no stenosis. Atherosclerotic calcification of the proximal right vertebral artery with 30% stenosis. Atherosclerotic calcification in both carotid siphon regions but without stenosis greater than 30% suspected. No intracranial large vessel occlusion or correctable proximal stenosis. Enlarging mass in the left upper lobe, now up to 13 mm in size, worrisome for lung carcinoma. PET CT or tissue sampling suggested. Electronically Signed   By: Nelson Chimes M.D.   On: 10/19/2020 14:05    Procedures Procedures   Medications Ordered in ED Medications - No data to display  ED Course  I have reviewed the triage vital signs and the nursing  notes.  Pertinent labs &  imaging results that were available during my care of the patient were reviewed by me and considered in my medical decision making (see chart for details).    MDM Rules/Calculators/A&P                         75 yo female presents today with acute onset of vertigo.  Risk factors of a fib, not anticoagulated now, exam normal. Patient without acute neuro deficits on my exam.  She was initiated as a code stroke.  Seen by neurology and initial w/u normal.  Plan admission for ongoing evaluation and management.  Discussed with Dr. Roslynn Amble and he will discuss with medicine for admission  Final Clinical Impression(s) / ED Diagnoses Final diagnoses:  Vertigo  Stroke-like symptoms    Rx / DC Orders ED Discharge Orders     None        Pattricia Boss, MD 10/19/20 1515

## 2020-10-19 NOTE — H&P (Signed)
History and Physical    Kim Blankenship GNF:[id number] DOB: 04/03/1945 DOA: 10/19/2020  PCP: Jolinda Croak, MD Consultants:  none  Patient coming from:  home via EMS.   Chief Complaint: sudden onset dizziness and unsteady gait.   HPI: Kim Blankenship is a 75 y.o. female with medical history significant of  COPD, CVA, chronic diastolic heart failure, paroxysmal atrial fibrillation not on anticoagulation due to history of GI bleed, hypertension, valvular intraepithelial neoplasm, history of seizures, hyperlipidemia and depression who presents with sudden onset of dizziness and feeling off balance that started this AM when she woke up.  She also feels like her speech may have been mumbled. She had some fluttering in her chest, but no chest pain. Denies any unilateral weakness, facial droop, fall.  LKW: 10/18/20 at 20:00.   Dizziness described as she is spinning and the world is standing still. Denies any tinnitus or hearing loss. Denies any ear pain or drainage. NO vision changes, has had some headaches. Dizziness worse with head movement to the left. Positional changes don't seem to exacerbate it. She continues to be dizzy even when still or lying down. Denies any recent URI, does have allergies. No hx of BPPV in the past. She denies any N/V/D.    ED Course: code stroke initiated. CBG 102, CTH no acute stroke. Vitals: temp: 99.6, bp: 154/70, HR: 91, RR: 18 and oxygen 99% on RA. Pertinent labs: wbc: 17.4, UA: moderate hgb and leukocytes, neurology following and we were asked to admit.   Review of Systems: As per HPI; otherwise review of systems reviewed and negative.   Ambulatory Status:  Ambulates without assistance    Past Medical History:  Diagnosis Date   Asthma    Atrial fibrillation ([place])    Cancer ([place])    cervical   COPD (chronic obstructive pulmonary disease) ([employer])    Hypertension    Irregular heart rate    PRES (posterior reversible encephalopathy syndrome)     Stroke [facility])     Past Surgical History:  Procedure Laterality Date   CESAREAN SECTION     Left Foot Reconstruction     OPEN REDUCTION INTERNAL FIXATION (ORIF) METACARPAL Right 03/10/2015   Procedure: OPEN REDUCTION INTERNAL FIXATION (ORIF) RIGHT LONG  METACARPAL FRACTURE;  Surgeon: Leanora Cover, MD;  Location: [place];  Service: Orthopedics;  Laterality: Right;   TONSILLECTOMY     TOTAL HIP ARTHROPLASTY Right 02/09/2016   Procedure: TOTAL HIP ARTHROPLASTY ANTERIOR APPROACH;  Surgeon: Frederik Pear, MD;  Location: [facility];  Service: Orthopedics;  Laterality: Right;    Social History   Socioeconomic History   Marital status: Divorced    Spouse name: Not on file   Number of children: 1   Years of education: Not on file   Highest education level: Not on file  Occupational History   Not on file  Tobacco Use   Smoking status: Former    Packs/day: 2.00    Years: 30.00    Pack years: 60.00    Types: Cigarettes    Quit date: 04/1995    Years since quitting: 25.5   Smokeless tobacco: Never  Vaping Use   Vaping Use: Never used  Substance and Sexual Activity   Alcohol use: Yes   Drug use: No   Sexual activity: Not on file  Other Topics Concern   Not on file  Social History Narrative   Lives with daughter and her son   Caffeine use: 1 cup coffee  every morning   Social Determinants of Health   Financial Resource Strain: Not on file  Food Insecurity: Not on file  Transportation Needs: Not on file  Physical Activity: Not on file  Stress: Not on file  Social Connections: Not on file  Intimate Partner Violence: Not on file    Allergies  Allergen Reactions   Levofloxacin     Other reaction(s): Other Leg pain   Tape Other (See Comments)    THE PATIENT'S SKIN IS THIN AND TEARS VERY EASILY (she "picks" at it; please use coban wrap)    Family History  Problem Relation Age of Onset   Dementia Father     Prior to Admission medications   Medication Sig Start  Date End Date Taking? Authorizing Provider  albuterol (VENTOLIN HFA) 108 (90 Base) MCG/ACT inhaler Inhale 2 puffs into the lungs every 4 (four) hours as needed for wheezing or shortness of breath. 09/30/20  Yes Isla Pence, MD  amLODipine (NORVASC) 2.5 MG tablet Take 2.5 mg by mouth daily.   Yes [provider]  aspirin EC 81 MG tablet Take 81 mg by mouth daily.   Yes [provider]  atorvastatin (LIPITOR) 20 MG tablet Take 20 mg by mouth at bedtime. 04/08/16  Yes [provider]  busPIRone (BUSPAR) 10 MG tablet Take 10 mg by mouth 2 (two) times daily. 01/29/20  Yes [provider]  calcium carbonate (TUMS - DOSED IN MG ELEMENTAL CALCIUM) 500 MG chewable tablet Chew 1 tablet by mouth daily as needed for indigestion or heartburn.   Yes [provider]  ferrous sulfate 325 (65 FE) MG tablet Take 1 tablet (325 mg total) by mouth 2 (two) times daily with a meal. 12/10/18  Yes Hongalgi, Lenis Dickinson, MD  folic acid (FOLVITE) 1 MG tablet Take 1 tablet (1 mg total) by mouth daily. 12/11/18  Yes Hongalgi, Lenis Dickinson, MD  metoprolol tartrate (LOPRESSOR) 50 MG tablet Take 50 mg by mouth 2 (two) times daily. 01/29/20  Yes [provider]  Multiple Vitamin (MULTIVITAMIN WITH MINERALS) TABS tablet Take 1 tablet by mouth daily. 12/11/18  Yes Hongalgi, Lenis Dickinson, MD  OLANZapine (ZYPREXA) 2.5 MG tablet Take 2.5 mg by mouth at bedtime.   Yes [provider]  QUEtiapine (SEROQUEL) 100 MG tablet Take 200 mg by mouth at bedtime.    Yes [provider]  spironolactone (ALDACTONE) 25 MG tablet Take 25 mg by mouth daily.   Yes [provider]  thiamine 100 MG tablet Take 1 tablet (100 mg total) by mouth daily. 12/11/18  Yes Hongalgi, Lenis Dickinson, MD  azithromycin (ZITHROMAX) 250 MG tablet Take 250-500 mg by mouth See admin instructions. Take 500 mg by mouth on the first day, then take 250 mg daily until gone. Patient not taking: No sig reported    [provider]  chlorpheniramine-HYDROcodone (TUSSIONEX PENNKINETIC ER) 10-8 MG/5ML SUER Take 5 mLs by mouth every 12 (twelve) hours as needed for cough. Patient not taking: No sig reported 09/30/20   Isla Pence, MD  doxycycline (VIBRAMYCIN) 100 MG capsule Take 1 capsule (100 mg total) by mouth 2 (two) times daily. Patient not taking: No sig reported 09/30/20   Isla Pence, MD  Ipratropium-Albuterol (COMBIVENT RESPIMAT) 20-100 MCG/ACT AERS respimat Inhale 1 puff into the lungs every 6 (six) hours. Patient not taking: No sig reported 09/30/20   Isla Pence, MD  mometasone-formoterol [facility]) 200-5 MCG/ACT AERO Inhale 2 puffs into the lungs 2 (two) times daily. Patient  not taking: No sig reported 10/12/20   Charlynne Cousins, MD  predniSONE (DELTASONE) 10 MG tablet Takes 6 tablets for 1 days, then 5 tablets for 1 days, then 4 tablets for 1 days, then 3 tablets for 1 days, then 2 tabs for 1 days, then 1 tab for 1 days, and then stop. Patient not taking: No sig reported 10/12/20   Charlynne Cousins, MD  tiotropium (SPIRIVA HANDIHALER) 18 MCG inhalation capsule Place 1 capsule (18 mcg total) into inhaler and inhale daily. Patient not taking: No sig reported 10/12/20 10/12/21  Charlynne Cousins, MD  tiZANidine (ZANAFLEX) 2 MG tablet Take 2 mg by mouth at bedtime.    [provider]    Physical Exam: Vitals:   10/19/20 1455 10/19/20 1456 10/19/20 1457 10/19/20 1500  BP:    (!) 117/100  Pulse: 89 89 90 89  Resp: 20 (!) 22 18 18   Temp:      TempSrc:      SpO2: 95% 95% 95% 95%     General:  Appears calm and comfortable and is in NAD Eyes:  PERRL, EOMI, normal lids, iris ENT:  grossly normal hearing, lips & tongue, mmm; appropriate dentition Neck:  no LAD, masses or thyromegaly; no carotid bruits Cardiovascular:  RRR, no m/r/g. No LE edema.  Respiratory:   CTA bilaterally with no wheezes/rales/rhonchi.  Normal respiratory effort. Decreased breath sounds in bases.  Abdomen:   soft, NT, ND, NABS Back:   normal alignment, no CVAT Skin:  no rash or induration seen on limited exam Musculoskeletal:  grossly normal tone BUE/BLE. Strength 5/5 in bilateral UE and LE, good ROM, no bony abnormality Lower extremity:  No LE edema.  Limited foot exam with no ulcerations.  2+ distal pulses. Psychiatric:  grossly normal mood and affect, speech fluent and appropriate, AOx3 Neurologic:  CN 2-12 grossly intact, moves all extremities in coordinated fashion, sensation intact. Negative pronator drift. DTR 2+, dix halpike +bilaterally > on left     Radiological Exams on Admission: Independently reviewed - see discussion in A/P where applicable  DG Chest Port 1 View  Result Date: 10/19/2020 CLINICAL DATA:  Stroke. EXAM: PORTABLE CHEST 1 VIEW COMPARISON:  10/11/2020. FINDINGS: The cardiomediastinal contours are normal. Minor streaky left lung base atelectasis, new. Pulmonary vasculature is normal. No confluent consolidation, pleural effusion, or pneumothorax. No acute osseous abnormalities are seen. IMPRESSION: Minor streaky left lung base atelectasis. Electronically Signed   By: Keith Rake M.D.   On: 10/19/2020 15:55   CT HEAD CODE STROKE WO CONTRAST  Result Date: 10/19/2020 CLINICAL DATA:  Code stroke. Neurological deficit. Acute stroke suspected. Vertigo. Last seen normal 1030 hours. EXAM: CT HEAD WITHOUT CONTRAST TECHNIQUE: Contiguous axial images were obtained from the base of the skull through the vertex without intravenous contrast. COMPARISON:  04/25/2020 FINDINGS: Brain: The study suffers from some motion degradation. No sign of acute infarction, mass lesion, hemorrhage, hydrocephalus or extra-axial collection. There are mild chronic small-vessel ischemic changes of the hemispheric white matter. Vascular: There is atherosclerotic calcification of the major vessels at the base of the brain. Skull: Negative Sinuses/Orbits: Fluid levels in both maxillary sinuses consistent with  rhinosinusitis. Orbits negative. Other: None ASPECTS ([place] Stroke Program Early CT Score) - Ganglionic level infarction (caudate, lentiform nuclei, internal capsule, insula, M1-M3 cortex): 7 - Supraganglionic infarction (M4-M6 cortex): 3 Total score (0-10 with 10 being normal): 10 IMPRESSION: 1. No acute CT finding. Mild chronic small-vessel ischemic change of the hemispheric white matter. 2.  ASPECTS is 10. 3. These results were communicated to Dr. Quinn Axe At 1:35 pm on 10/19/2020 by text page via the [facility] messaging system. Electronically Signed   By: Nelson Chimes M.D.   On: 10/19/2020 13:37   CT ANGIO HEAD NECK W WO CM W PERF (CODE STROKE)  Result Date: 10/19/2020 CLINICAL DATA:  Code stroke. Neurological deficit. Vertigo. No acute CT finding. EXAM: CT ANGIOGRAPHY HEAD AND NECK CT PERFUSION BRAIN TECHNIQUE: Multidetector CT imaging of the head and neck was performed using the standard protocol during bolus administration of intravenous contrast. Multiplanar CT image reconstructions and MIPs were obtained to evaluate the vascular anatomy. Carotid stenosis measurements (when applicable) are obtained utilizing NASCET criteria, using the distal internal carotid diameter as the denominator. Multiphase CT imaging of the brain was performed following IV bolus contrast injection. Subsequent parametric perfusion maps were calculated using RAPID software. CONTRAST:  162mL OMNIPAQUE IOHEXOL 350 MG/ML SOLN COMPARISON:  Head CT earlier same day. FINDINGS: CTA NECK FINDINGS Aortic arch: Aortic atherosclerosis and tortuosity. Branching pattern is normal without origin stenosis. Right carotid system: Common carotid artery widely patent to the bifurcation. Calcified plaque at the ICA bulb and bifurcation but no stenosis. Cervical ICA is tortuous but widely patent. Left carotid system: Common carotid artery widely patent to the bifurcation. Calcified plaque at carotid bifurcation and ICA bulb but no stenosis. Cervical ICA is  tortuous but widely patent. Vertebral arteries: Both vertebral artery origins patent. Some atherosclerotic calcification at the proximal right vertebral artery but no stenosis greater than 30%. Both vertebral arteries patent beyond that through the cervical region to the foramen magnum. Skeleton: Cervical spondylosis and facet osteoarthritis. Degenerative change at the C1-2 articulation on the right which could cause craniocervical pain syndromes. Other neck: No neck mass or lymphadenopathy. Upper chest: Emphysema and pulmonary scarring. Mass lesion in the posterior aspect of the left upper lobe measuring 7 x 13 mm in size. This has enlarged considerably since May of 2021 and is worrisome for lung carcinoma. Review of the MIP images confirms the above findings CTA HEAD FINDINGS Anterior circulation: Both internal carotid arteries are patent through the skull base and siphon regions. There is ordinary siphon atherosclerotic calcification but no stenosis greater than 30% suspected. The anterior and middle cerebral vessels are patent. No large vessel occlusion. No correctable proximal stenosis. No aneurysm or vascular malformation. Posterior circulation: Both vertebral arteries widely patent through the foramen magnum to the basilar. No basilar stenosis. Posterior circulation branch vessels are normal. Venous sinuses: Patent and normal. Anatomic variants: None significant. Review of the MIP images confirms the above findings CT Brain Perfusion Findings: ASPECTS: 10 CBF (<30%) Volume: 44mL Perfusion (Tmax>6.0s) volume: 67mL Mismatch Volume: 0ML Infarction Location:None IMPRESSION: Negative CT perfusion study. Atherosclerotic calcification at both carotid bifurcation and ICA bulbs but no stenosis. Atherosclerotic calcification of the proximal right vertebral artery with 30% stenosis. Atherosclerotic calcification in both carotid siphon regions but without stenosis greater than 30% suspected. No intracranial large vessel  occlusion or correctable proximal stenosis. Enlarging mass in the left upper lobe, now up to 13 mm in size, worrisome for lung carcinoma. PET CT or tissue sampling suggested. Electronically Signed   By: Nelson Chimes M.D.   On: 10/19/2020 14:05    EKG: Independently reviewed.  NSR with rate 92 with PAC; nonspecific ST changes with no evidence of acute ischemia. No afib. Similar to previous ekg.    Labs on Admission: I have personally reviewed the available labs and imaging studies at  the time of the admission.  Pertinent labs:  Wbc: 17.4 Sodium: 132 UA: moderate hgb and moderate leukocytes     Assessment/Plan Principal Problem:   Dizziness -stroke vs. BPPV vs. Afib vs. Orthostasis.  -dix halpike is positive. Will start her on meclizine -MRI brain pending -will order stroke set with neuro vitals per protocol -tele  -NPO until bedside swallow screen -prophylactic therapy with ASA and clopidogrel per neurology -echo  -a1c and lipid pending  -PT/OT/ST eval -permissive HTN in first 24-48 hours, will need to re order home meds, see below.   Active Problems:   Unsteady gait -CTH negative, but ? If stroke reason for new unsteady gait.  -MRI brain pending -PT to eval /treat -if MRI brain negative, would look into further work up.      Mass of upper lobe of left lung -concern for malignancy and discussed this with patient.  -has grown substantially since 07/2019 -MRI brain pending, no mets seen on [facility] -needs w/u outpatient unless hospital team deems necessary during her stay: PET CT vs. Tissue sampling.     Leukocytosis -appears dry on exam and hemoconcentrated. Also recently had steroids.  -IVF x 12 hours and repeat in AM -urine culture pending with moderate hgb and leukocytes, no urinary complaints.     COPD (chronic obstructive pulmonary disease) (HCC) -stable, no indication of exacerbation -does not like combivent, was discharged on this at last hospitalization.  -will  start dulera and spiriva back. May need assistance with medication    PAF (paroxysmal atrial fibrillation) (H[facility]) -no anticoagulation due to GI bleed in the past, unsure how long ago this was. She couldn't tell me. If has stroke on MRI ? If we can restart this if GI bleed was in the remote past.  -may need zio or ILR    Essential hypertension -well controlled, continue home medication tomorrow. Allow for permissive HTN in first 24-48 hours.  -will need to restart her norvasc 2.5mg , aldactone 25mg  and lopressor 50mg  BID.     Seizure ([place]) -history on chart, but per patient and med rec she is on no medication -seizure precautions     HLD (hyperlipidemia) -lipid panel pending -continue lipitor 20mg , if MRI shows stroke LDL goal<70    Depression  -continue home medication    There is no height or weight on file to calculate BMI.     Level of care: Telemetry Medical DVT prophylaxis:   SCDs Code Status:  Full - confirmed with patient Family Communication: None present; I attempted to call patient's daughter 3x. No voicemail set up.  Annye Rusk: [phone] Disposition Plan:  The patient is from: home  Anticipated d/c is to: home after stroke work up and evaluation has been completed.    Consults called: neurology by EDP   Admission status:  observation    Orma Flaming MD [facility]   How to contact the [facility] Attending or Consulting provider [place] or covering provider during after hours [place], for this patient?  Check the care team in [facility] and look for a) attending/consulting [facility] provider listed and b) the [facility] team listed Log into [email or web address] and use [place]'s universal password to access. If you do not have the password, please contact the hospital operator. Locate the [facility] provider you are looking for under [facility] and page to a number that you can be directly reached. If you still have difficulty reaching the provider, please page the [facility]  (Director on Call) for the  Hospitalists listed on amion for assistance.   10/19/2020, 4:30 PM

## 2020-10-19 NOTE — Consult Note (Signed)
Neurology Consultation  Reason for Consult: Code Stroke: acutely dizzy /  off balance Referring Physician: Dr. Sherry Ruffing  CC: Dizziness  History is obtained from: Patient, triage EDP, Chart review  HPI: Kim Blankenship is a 75 y.o. female with a medical history significant for atrial fibrillation not on anticoagulation 2/2 hx GIB, stroke, PRES, essential hypertension, hyperlipidemia, cervical cancer, and COPD who presented to the ED via POV for evaluation of dizziness and feeling off balance. She states that she was ambulating at baseline without difficulty prior to going to bed last night but when she woke up this morning at 08:30 she felt extremely dizzy and had an unsteady gait due to her dizziness. She states that the dizziness did not progress but due to the prolonged amount of time she felt dizzy, she decided to come in and be evaluated.  LKW: 10/19/2019 20:00 tpa given?: no, outside of time window for thrombolytic therapy IR Thrombectomy? No, presentation not consistent with LVO, vessel imaging reviewed without evidence supporting LVO Modified Rankin Scale: 1-No significant post stroke disability and can perform usual duties with stroke symptoms  ROS: A complete ROS was performed and is negative except as noted in the HPI.  Past Medical History:  Diagnosis Date   Asthma    Atrial fibrillation ([place])    Cancer ([place])    cervical   COPD (chronic obstructive pulmonary disease) ([place])    Hypertension    Irregular heart rate    PRES (posterior reversible encephalopathy syndrome)    Stroke [facility])    Past Surgical History:  Procedure Laterality Date   CESAREAN SECTION     Left Foot Reconstruction     OPEN REDUCTION INTERNAL FIXATION (ORIF) METACARPAL Right 03/10/2015   Procedure: OPEN REDUCTION INTERNAL FIXATION (ORIF) RIGHT LONG  METACARPAL FRACTURE;  Surgeon: Leanora Cover, MD;  Location: [place];  Service: Orthopedics;  Laterality: Right;   TONSILLECTOMY     TOTAL  HIP ARTHROPLASTY Right 02/09/2016   Procedure: TOTAL HIP ARTHROPLASTY ANTERIOR APPROACH;  Surgeon: Frederik Pear, MD;  Location: [facility];  Service: Orthopedics;  Laterality: Right;   Family History  Problem Relation Age of Onset   Dementia Father    Social History:   reports that she quit smoking about 25 years ago. Her smoking use included cigarettes. She has a 60.00 pack-year smoking history. She has never used smokeless tobacco. She reports current alcohol use. She reports that she does not use drugs.  Medications  Current Facility-Administered Medications:    aspirin tablet 325 mg, 325 mg, Oral, Once, Toberman, Stevi W, NP   clopidogrel (PLAVIX) tablet 75 mg, 75 mg, Oral, Once, Toberman, Stevi W, NP   [START ON 10/20/2020] clopidogrel (PLAVIX) tablet 75 mg, 75 mg, Oral, Daily, Rikki Spearing, NP  Current Outpatient Medications:    albuterol (VENTOLIN HFA) 108 (90 Base) MCG/ACT inhaler, Inhale 2 puffs into the lungs every 4 (four) hours as needed for wheezing or shortness of breath., Disp: 18 g, Rfl: 0   amLODipine (NORVASC) 2.5 MG tablet, Take 2.5 mg by mouth daily., Disp: , Rfl:    aspirin EC 81 MG tablet, Take 81 mg by mouth daily., Disp: , Rfl:    atorvastatin (LIPITOR) 20 MG tablet, Take 20 mg by mouth daily., Disp: , Rfl:    busPIRone (BUSPAR) 10 MG tablet, Take 10 mg by mouth 2 (two) times daily., Disp: , Rfl:    calcium carbonate (TUMS - DOSED IN MG ELEMENTAL CALCIUM) 500 MG chewable tablet,  Chew 1 tablet by mouth daily as needed for indigestion or heartburn., Disp: , Rfl:    chlorpheniramine-HYDROcodone (TUSSIONEX PENNKINETIC ER) 10-8 MG/5ML SUER, Take 5 mLs by mouth every 12 (twelve) hours as needed for cough., Disp: 140 mL, Rfl: 0   doxycycline (VIBRAMYCIN) 100 MG capsule, Take 1 capsule (100 mg total) by mouth 2 (two) times daily., Disp: 14 capsule, Rfl: 0   ferrous sulfate 325 (65 FE) MG tablet, Take 1 tablet (325 mg total) by mouth 2 (two) times daily with a meal., Disp: 60  tablet, Rfl: 0   folic acid (FOLVITE) 1 MG tablet, Take 1 tablet (1 mg total) by mouth daily., Disp: 30 tablet, Rfl: 0   Ipratropium-Albuterol (COMBIVENT RESPIMAT) 20-100 MCG/ACT AERS respimat, Inhale 1 puff into the lungs every 6 (six) hours., Disp: 4 g, Rfl: 0   metoprolol tartrate (LOPRESSOR) 50 MG tablet, Take 50 mg by mouth 2 (two) times daily., Disp: , Rfl:    mometasone-formoterol (DULERA) 200-5 MCG/ACT AERO, Inhale 2 puffs into the lungs 2 (two) times daily., Disp: 1 each, Rfl: 3   Multiple Vitamin (MULTIVITAMIN WITH MINERALS) TABS tablet, Take 1 tablet by mouth daily., Disp:  , Rfl:    OLANZapine (ZYPREXA) 2.5 MG tablet, Take 2.5 mg by mouth at bedtime., Disp: , Rfl:    predniSONE (DELTASONE) 10 MG tablet, Takes 6 tablets for 1 days, then 5 tablets for 1 days, then 4 tablets for 1 days, then 3 tablets for 1 days, then 2 tabs for 1 days, then 1 tab for 1 days, and then stop., Disp: 21 tablet, Rfl: 0   QUEtiapine (SEROQUEL) 100 MG tablet, Take 200 mg by mouth at bedtime. , Disp: , Rfl:    spironolactone (ALDACTONE) 25 MG tablet, Take 25 mg by mouth daily., Disp: , Rfl:    thiamine 100 MG tablet, Take 1 tablet (100 mg total) by mouth daily., Disp: 30 tablet, Rfl: 0   tiotropium (SPIRIVA HANDIHALER) 18 MCG inhalation capsule, Place 1 capsule (18 mcg total) into inhaler and inhale daily., Disp: 30 capsule, Rfl: 2   tiZANidine (ZANAFLEX) 2 MG tablet, Take 2 mg by mouth at bedtime., Disp: , Rfl:   Exam: Current vital signs: BP (!) 183/79   Pulse 91   Temp 99.6 F (37.6 C) (Oral)   Resp 20   SpO2 98%  Vital signs in last 24 hours: Temp:  [99.6 F (37.6 C)] 99.6 F (37.6 C) (07/21 1255) Pulse Rate:  [91-97] 91 (07/21 1416) Resp:  [18-25] 20 (07/21 1416) BP: (154-183)/(70-79) 183/79 (07/21 1400) SpO2:  [98 %-99 %] 98 % (07/21 1416)  GENERAL: Awake, alert, sitting on CT table in no acute distress Psych: Affect appropriate for situation, patient calm and cooperative with  examination Head: Normocephalic and atraumatic, without obvious abnormality EENT: Normal conjunctivae, no OP obstruction, dry mucous membranes, wears eyeglasses LUNGS: Normal respiratory effort. Non-labored breathing CV: regular rate, irregular rhythm on telemetry ABDOMEN: Soft, non-tender, non-distended Ext: warm, well perfused, without obvious deformity  NEURO:  Mental Status: Awake, alert, and oriented to person, place, time, and situation.  She is able to provide details regarding history of present illness though with some variation in reporting to different providers with initial unclear last known well time and time of symptom onset.  Speech/Language: speech is intact without dysarthria.   Naming, repetition, fluency, and comprehension intact without aphasia. No neglect noted.  Cranial Nerves:  II: PERRL. Visual fields full.  III, IV, VI: EOMI without ptosis, nystagmus, or  gaze palsy V: Sensation is intact to light touch and symmetrical to face.   VII: Face is symmetric resting and smiling.   VIII: Hearing is intact to voice IX, X: Palate elevation is symmetric. Phonation normal.  XI: Normal sternocleidomastoid and trapezius muscle strength XII: Tongue protrudes midline without fasciculations.   Motor: 5/5 strength present in bilateral upper and lower extremities without vertical drift on assessment.  Tone is normal. Bulk is normal.  Sensation: Initially reports decreased sensation to light touch on the left upper extremity but with resolution on reassessment. Coordination: FTN intact bilaterally. HKS intact bilaterally. No pronator drift.  DTRs: 3+ and symmetric patellae and brachioradialis Gait: Deferred  NIHSS: 1a Level of Conscious.: 0 1b LOC Questions: 0 1c LOC Commands: 0 2 Best Gaze: 0 3 Visual: 0 4 Facial Palsy: 0 5a Motor Arm - left: 0 5b Motor Arm - Right: 0 6a Motor Leg - Left: 0 6b Motor Leg - Right: 0 7 Limb Ataxia: 1 8 Sensory: 1 9 Best Language: 0 10  Dysarthria: 0 11 Extinct. and Inatten.: 0 TOTAL: 2  Labs I have reviewed labs in epic and the results pertinent to this consultation are: CBC    Component Value Date/Time   WBC 17.4 (H) 10/19/2020 1320   RBC 4.65 10/19/2020 1320   HGB 15.0 10/19/2020 1331   HGB 13.8 04/30/2016 0906   HCT 44.0 10/19/2020 1331   HCT 25.9 (L) 12/05/2018 0805   PLT 292 10/19/2020 1320   PLT 415 (H) 04/30/2016 0906   MCV 93.3 10/19/2020 1320   MCV 91 04/30/2016 0906   MCH 32.5 10/19/2020 1320   MCHC 34.8 10/19/2020 1320   RDW 12.1 10/19/2020 1320   RDW 14.3 04/30/2016 0906   LYMPHSABS 1.5 10/19/2020 1320   MONOABS 1.1 (H) 10/19/2020 1320   EOSABS 0.0 10/19/2020 1320   BASOSABS 0.1 10/19/2020 1320   CMP     Component Value Date/Time   NA 131 (L) 10/19/2020 1331   NA 139 04/30/2016 0906   K 4.0 10/19/2020 1331   CL 96 (L) 10/19/2020 1331   CO2 27 10/19/2020 1320   GLUCOSE 102 (H) 10/19/2020 1331   BUN 6 (L) 10/19/2020 1331   BUN 9 04/30/2016 0906   CREATININE 0.70 10/19/2020 1331   CALCIUM 9.2 10/19/2020 1320   PROT 6.6 10/19/2020 1320   ALBUMIN 3.8 10/19/2020 1320   AST 25 10/19/2020 1320   ALT 20 10/19/2020 1320   ALKPHOS 78 10/19/2020 1320   BILITOT 1.4 (H) 10/19/2020 1320   GFRNONAA >60 10/19/2020 1320   GFRAA >60 08/06/2019 2357   Lipid Panel     Component Value Date/Time   CHOL 228 (H) 04/30/2016 0906   TRIG 76 04/30/2016 0906   HDL 91 04/30/2016 0906   CHOLHDL 2.5 04/30/2016 0906   LDLCALC 122 (H) 04/30/2016 0906   Lab Results  Component Value Date   HGBA1C 5.4 10/30/2016   Imaging I have reviewed the images obtained:  CT-scan of the brain 10/19/2020: 1. No acute CT finding. Mild chronic small-vessel ischemic change of the hemispheric white matter. 2. ASPECTS is 10.  CT angio head and neck wwo and CT cerebral perfusion 10/19/2020: Negative CT perfusion study. Atherosclerotic calcification at both carotid bifurcation and ICA bulbs but no stenosis. Atherosclerotic  calcification of the proximal right vertebral artery with 30% stenosis. Atherosclerotic calcification in both carotid siphon regions but without stenosis greater than 30% suspected. No intracranial large vessel occlusion or correctable proximal stenosis. Enlarging mass  in the left upper lobe, now up to 13 mm in size, worrisome for lung carcinoma. PET CT or tissue sampling suggested.  Assessment: 75 y.o. female who presented for evaluation of dizziness with unsteady gait since she woke up this morning at 08:30. - Examination reveals patient with persistent dizziness, mild left lower extremity ataxia, and complaints of decreased sensation to the left upper extremity initially with resolution of sensory disturbance and ataxia on reassessment. Initial NIHSS is 2 at the bridge with resolution following CT scan and NIHSS of 0 once in ED room. - CT imaging is without acute intracranial finding. CT perfusion was complete due to variability in patient story and unclear last known well. CT perfusion was normal with CTA evidence of atherosclerotic calcification in bilateral carotid bifurcations and ICA bulbs without stenosis, proximal right vertebral artery with 30% stenosis, both carotid siphon regions but without stenosis greater than 30% suspected. Vessel imaging without LVO or correctable proximal stenosis.  - Stroke risk factors include patient age, tobacco use, atrial fibrillation, remote history of stroke, essential hypertension, hyperlipidemia, and COPD.  - Etiology of dizziness unclear but ddx includes dizziness associated with arrhythmia with history of atrial fibrillation not on anticoagulation, small stroke not identified on CT imaging, vertigo, or hypertensive urgency. MRI brain pending for further evaluation.   Recommendations:  - Hospitalist admission for further work up and evaluation including other medical issues that could have contributed to her presentation - MRI brain without contrast - TTE   - Prophylactic therapy- Antiplatelet med: Continue ASA 81 mg PO + clopidogrel 75 mg PO. Pt has hx a fib but is not on AC 2/2 hx GIB - Permissive hypertension for 48 hours s/p symptom onset or until stroke rules out by MRI brain. Treat SBP > 220.  - LDL, A1c  - Risk factor modification - Telemetry monitoring - PT consult, OT consult, Speech consult - Stroke team will continue to follow  Pt seen by NP/Neuro and later by MD. Note/plan to be edited by MD as needed.  Anibal Henderson, AGAC-NP [facility] Pager: [phone]  Neurology Attending Attestation   I examined the patient and discussed plan with Ms. Toberman NP. Above note has been edited by me to reflect my findings and recommendations. I was present throughout the stroke code and made all significant decisions and personally reviewed CNS imaging.    Su Monks, MD [facility] [phone]   If 7pm- 7am, please page neurology on call as listed in [place].

## 2020-10-20 ENCOUNTER — Inpatient Hospital Stay (HOSPITAL_COMMUNITY): Payer: Medicare Other

## 2020-10-20 ENCOUNTER — Observation Stay (HOSPITAL_COMMUNITY): Payer: Medicare Other

## 2020-10-20 DIAGNOSIS — I5032 Chronic diastolic (congestive) heart failure: Secondary | ICD-10-CM | POA: Diagnosis present

## 2020-10-20 DIAGNOSIS — Z20822 Contact with and (suspected) exposure to covid-19: Secondary | ICD-10-CM | POA: Diagnosis present

## 2020-10-20 DIAGNOSIS — F419 Anxiety disorder, unspecified: Secondary | ICD-10-CM | POA: Diagnosis present

## 2020-10-20 DIAGNOSIS — R29702 NIHSS score 2: Secondary | ICD-10-CM | POA: Diagnosis present

## 2020-10-20 DIAGNOSIS — J449 Chronic obstructive pulmonary disease, unspecified: Secondary | ICD-10-CM | POA: Diagnosis present

## 2020-10-20 DIAGNOSIS — R2990 Unspecified symptoms and signs involving the nervous system: Secondary | ICD-10-CM | POA: Diagnosis not present

## 2020-10-20 DIAGNOSIS — I951 Orthostatic hypotension: Secondary | ICD-10-CM | POA: Diagnosis present

## 2020-10-20 DIAGNOSIS — C519 Malignant neoplasm of vulva, unspecified: Secondary | ICD-10-CM | POA: Diagnosis present

## 2020-10-20 DIAGNOSIS — I6389 Other cerebral infarction: Secondary | ICD-10-CM | POA: Diagnosis not present

## 2020-10-20 DIAGNOSIS — Z515 Encounter for palliative care: Secondary | ICD-10-CM | POA: Diagnosis not present

## 2020-10-20 DIAGNOSIS — F32A Depression, unspecified: Secondary | ICD-10-CM | POA: Diagnosis present

## 2020-10-20 DIAGNOSIS — R42 Dizziness and giddiness: Secondary | ICD-10-CM

## 2020-10-20 DIAGNOSIS — R918 Other nonspecific abnormal finding of lung field: Secondary | ICD-10-CM

## 2020-10-20 DIAGNOSIS — Z96641 Presence of right artificial hip joint: Secondary | ICD-10-CM | POA: Diagnosis present

## 2020-10-20 DIAGNOSIS — I480 Paroxysmal atrial fibrillation: Secondary | ICD-10-CM | POA: Diagnosis present

## 2020-10-20 DIAGNOSIS — I6340 Cerebral infarction due to embolism of unspecified cerebral artery: Secondary | ICD-10-CM | POA: Diagnosis present

## 2020-10-20 DIAGNOSIS — Z66 Do not resuscitate: Secondary | ICD-10-CM

## 2020-10-20 DIAGNOSIS — I69354 Hemiplegia and hemiparesis following cerebral infarction affecting left non-dominant side: Secondary | ICD-10-CM | POA: Diagnosis not present

## 2020-10-20 DIAGNOSIS — E860 Dehydration: Secondary | ICD-10-CM | POA: Diagnosis present

## 2020-10-20 DIAGNOSIS — E785 Hyperlipidemia, unspecified: Secondary | ICD-10-CM | POA: Diagnosis present

## 2020-10-20 DIAGNOSIS — L89311 Pressure ulcer of right buttock, stage 1: Secondary | ICD-10-CM | POA: Diagnosis present

## 2020-10-20 DIAGNOSIS — I6783 Posterior reversible encephalopathy syndrome: Secondary | ICD-10-CM | POA: Diagnosis present

## 2020-10-20 DIAGNOSIS — K219 Gastro-esophageal reflux disease without esophagitis: Secondary | ICD-10-CM | POA: Diagnosis present

## 2020-10-20 DIAGNOSIS — R2681 Unsteadiness on feet: Secondary | ICD-10-CM | POA: Diagnosis present

## 2020-10-20 DIAGNOSIS — I110 Hypertensive heart disease with heart failure: Secondary | ICD-10-CM | POA: Diagnosis present

## 2020-10-20 DIAGNOSIS — H5500 Unspecified nystagmus: Secondary | ICD-10-CM | POA: Diagnosis present

## 2020-10-20 DIAGNOSIS — L89321 Pressure ulcer of left buttock, stage 1: Secondary | ICD-10-CM | POA: Diagnosis present

## 2020-10-20 LAB — CBC
HCT: 35.8 % — ABNORMAL LOW (ref 36.0–46.0)
Hemoglobin: 12.3 g/dL (ref 12.0–15.0)
MCH: 31.9 pg (ref 26.0–34.0)
MCHC: 34.4 g/dL (ref 30.0–36.0)
MCV: 92.7 fL (ref 80.0–100.0)
Platelets: 210 10*3/uL (ref 150–400)
RBC: 3.86 MIL/uL — ABNORMAL LOW (ref 3.87–5.11)
RDW: 12.2 % (ref 11.5–15.5)
WBC: 10.7 10*3/uL — ABNORMAL HIGH (ref 4.0–10.5)
nRBC: 0 % (ref 0.0–0.2)

## 2020-10-20 LAB — LIPID PANEL
Cholesterol: 161 mg/dL (ref 0–200)
HDL: 77 mg/dL (ref >40)
LDL Cholesterol: 78 mg/dL (ref 0–99)
Total CHOL/HDL Ratio: 2.1 RATIO
Triglycerides: 30 mg/dL (ref <150)
VLDL: 6 mg/dL (ref 0–40)

## 2020-10-20 LAB — BASIC METABOLIC PANEL
Anion gap: 8 (ref 5–15)
BUN: 6 mg/dL — ABNORMAL LOW (ref 8–23)
CO2: 22 mmol/L (ref 22–32)
Calcium: 8.5 mg/dL — ABNORMAL LOW (ref 8.9–10.3)
Chloride: 104 mmol/L (ref 98–111)
Creatinine, Ser: 0.68 mg/dL (ref 0.44–1.00)
GFR, Estimated: >60 mL/min (ref >60)
Glucose, Bld: 112 mg/dL — ABNORMAL HIGH (ref 70–99)
Potassium: 3.9 mmol/L (ref 3.5–5.1)
Sodium: 134 mmol/L — ABNORMAL LOW (ref 135–145)

## 2020-10-20 LAB — TSH: TSH: 1.574 u[IU]/mL (ref 0.350–4.500)

## 2020-10-20 LAB — HEMOGLOBIN A1C
Hgb A1c MFr Bld: 5.6 % (ref 4.8–5.6)
Mean Plasma Glucose: 114.02 mg/dL

## 2020-10-20 LAB — CORTISOL: Cortisol, Plasma: 6.3 ug/dL

## 2020-10-20 MED ORDER — PANTOPRAZOLE SODIUM 40 MG PO TBEC
40.0000 mg | DELAYED_RELEASE_TABLET | Freq: Every day | ORAL | Status: DC
  Administered 2020-10-20 – 2020-10-22 (×3): 40 mg via ORAL
  Filled 2020-10-20 (×3): qty 1

## 2020-10-20 MED ORDER — HEPARIN SODIUM (PORCINE) 5000 UNIT/ML IJ SOLN
5000.0000 [IU] | Freq: Three times a day (TID) | INTRAMUSCULAR | Status: DC
  Administered 2020-10-20 – 2020-10-22 (×5): 5000 [IU] via SUBCUTANEOUS
  Filled 2020-10-20 (×5): qty 1

## 2020-10-20 MED ORDER — METOPROLOL TARTRATE 5 MG/5ML IV SOLN: 5.0000 mg | Freq: Three times a day (TID) | INTRAVENOUS | Status: DC | PRN

## 2020-10-20 MED ORDER — LORAZEPAM 0.5 MG PO TABS: 0.5000 mg | ORAL_TABLET | Freq: Once | ORAL | Status: DC | PRN

## 2020-10-20 MED ORDER — KETOROLAC TROMETHAMINE 15 MG/ML IJ SOLN
15.0000 mg | Freq: Once | INTRAMUSCULAR | Status: AC | PRN
  Administered 2020-10-20: 15 mg via INTRAVENOUS
  Filled 2020-10-20: qty 1

## 2020-10-20 MED ORDER — ATORVASTATIN CALCIUM 40 MG PO TABS
40.0000 mg | ORAL_TABLET | Freq: Every day | ORAL | Status: DC
  Administered 2020-10-20 – 2020-10-21 (×2): 40 mg via ORAL
  Filled 2020-10-20 (×2): qty 1

## 2020-10-20 NOTE — Progress Notes (Addendum)
STROKE TEAM PROGRESS NOTE   INTERVAL HISTORY Admitted yesterday for persistent dizziness. MRI revealed a punctuate infarct of the precentral gyrus which is likely clinically silent from history of paroxysmal A. fib not on anticoagulation due to GI bleed.Marland Kitchen CTA head and neck were negative for LVO however showed a 1.3cm mass in the left apex of the lung.   She was seen by physical therapy this morning who noted she had positive orthostatic vitals on evaluation.   Vitals:   10/19/20 2100 10/19/20 2133 10/19/20 2222 10/20/20 0412  BP:  128/79 (!) 130/95 123/73  Pulse: 74 83 83 82  Resp: '17 20 18 18  '$ Temp:  98.1 F (36.7 C) 98.5 F (36.9 C) 97.7 F (36.5 C)  TempSrc:  Temporal Oral Oral  SpO2: 94% 97% 96% 97%  Weight:  50.3 kg    Height:  '5\' 4"'$  (1.626 m)     CBC:  Recent Labs  Lab 10/19/20 1320 10/19/20 1331 10/20/20 0538  WBC 17.4*  --  10.7*  NEUTROABS 14.7*  --   --   HGB 15.1* 15.0 12.3  HCT 43.4 44.0 35.8*  MCV 93.3  --  92.7  PLT 292  --  [id number]   Basic Metabolic Panel:  Recent Labs  Lab 10/19/20 1320 10/19/20 1331 10/20/20 0538  NA 132* 131* 134*  K 4.0 4.0 3.9  CL 95* 96* 104  CO2 27  --  22  GLUCOSE 104* 102* 112*  BUN 7* 6* 6*  CREATININE 0.84 0.70 0.68  CALCIUM 9.2  --  8.5*   Lipid Panel:  Recent Labs  Lab 10/20/20 0538  CHOL 161  TRIG 30  HDL 77  CHOLHDL 2.1  VLDL 6  LDLCALC 78   HgbA1c:  Recent Labs  Lab 10/20/20 0538  HGBA1C 5.6   Urine Drug Screen:  Recent Labs  Lab 10/19/20 1320  LABOPIA NONE DETECTED  COCAINSCRNUR NONE DETECTED  LABBENZ NONE DETECTED  AMPHETMU NONE DETECTED  THCU NONE DETECTED  LABBARB NONE DETECTED    Alcohol Level  Recent Labs  Lab 10/19/20 1320  ETH <10   CT head w/o: No acute infarct. ASPECTS 10  CTA head/neck: Atherosclerotic disease of the bilateral carotids and vertebral arteries No LVO 71m mass in the left lupper lobe  Brain MRI: Punctuate acute infarct in the right precentral gyrus. Slight  edema without mass effect Chronic microvascular ischemic disease and mild atrophy  PHYSICAL EXAM General: Frail appearing elderly Caucasian female in no acute distress Neurologic exam: Mental status: alert and oriented; normal affect Speech: normal speech pattern and articulation Cranial nerves: EOMs intact. Peripheral vision intact. No nystagmus. No facial droop. Hearing intact Motor: 5/5 strength in the bilateral upper and lower extremities. Fine motor movement intact Sensation: intact in the extremities Cerebellar: normal finger to nose and rapid alternating movements. Exacerbation of vertigo by rapid position change. Improvement in vertigo with laying on the right.   ASSESSMENT/PLAN Ms. DCyonna Blankenship a 75y.o. female with atrial fibrillation (not on AC 2/2 hx of GI bleed), stroke, PRES, hypertension, and hyperlipidemia who presented for dizziness. Initial head CT and perfusion scan did not suggest an acute ischemic event however MRI revealed a punctuate infarct involving the right precentral gyrus.  Punctuate infarct of the precentral gyrus likely silent embolic infarct from atrial fibrillation not on anticoagulation. Multiple risk factors are present including hypertension, hyperlipidemia, tobacco use, prior stroke, afib not on AC.  Low suspicion for infarct contributing to presenting symptoms.  CT head w/o: no acute findings. ASPECTS 10 CTA head/neck: no LVO CT perfusion: negative MRI brain: punctuate infarct of the precentral gyrus Echocardiogram pending LDL 78 HgbA1c 5.6 Plan Start DAPT with aspirin and plavix for 21 days followed by aspirin alone patient has history of atrial fibrillation but has not been on anticoagulation due to prior history of GI bleed, dementia and history of accidental medication overdose. Therapy recommendations:  no follow up needed  Persistent vertigo History and exam are concerning for BPPV and may be contributing to presenting symptoms, in  addition to orthostasis.  Hold home antihypertensives Consult placed to vestibular therapy for further evaluation  Hyperlipidemia LDL 78; LDL goal <70 Increase lipitor to '40mg'$  daily  Tobacco use disorder Encourage cessation  Paroxysmal atrial fibrillation.  CHADSVASC 5= 7.2% stroke risk per year HAS-BLED 5= high risk of major bleeding Not on anticoagulation due to prior UGIB in 2020 in the context of heavy NSAID use.  Consider cardiology referral further risk/benefit analysis  Left Lung mass-pulmonary has been consulted for ongoing workup  Hospital day # [place], MD Internal Medicine Resident PGY-3 [facility] Pager: [phone] 10/20/2020 2:01 PM    Stroke MD note:I have personally obtained history,examined this patient, reviewed notes, independently viewed imaging studies, participated in medical decision making and plan of care.ROS completed by me personally and pertinent positives fully documented  I have made any additions or clarifications directly to the above note. Agree with note above.  Patient has presented with dizziness which appears to be multifactorial due to combination of orthostatic hypotension and vestibular dysfunction.  MRI scan shows a silent punctate right frontal cortical infarct likely from history of paroxysmal A. fib and not being on anticoagulation due to history of GI bleed in 2020.  She also has dementia and prior history of medication overdose inadvertently hence she may not be the best candidate for long-term anticoagulation unless medications are strictly supervised..  Moreover she has a left lung mass and may need consideration for biopsy or discussion of goals of care with daughter prior to making final decision on long-term anticoagulation.  Discussed with Dr. Candiss Norse.  Greater than 50% time during this 35-minute visit was spent in counseling and coordination of care and discussion with care team.  [name],  MD Medical Director [place] Pager: [phone] 10/20/2020 3:17 PM   To contact Stroke Continuity provider, please refer to [email or web address] After hours, contact General Neurology

## 2020-10-20 NOTE — TOC CAGE-AID Note (Signed)
Transition of Care [facility]) - CAGE-AID Screening   Patient Details  Name: Kim Blankenship MRN: [id number] Date of Birth: [birth year]  [facility] CM/SW Contact:    Gaetano Hawthorne Tarpley-Carter, [place] Phone Number: 10/20/2020, 1:53 PM   Clinical Narrative: Pt participated in [place].  Pt stated she does not use substance or alcohol.  Pt was not offered resources, due to not using substance or alcohol.    Malika Demario Tarpley-Carter, MSW, LCSW-A Pronouns:  She/Her/Hers [facility]itions [facility] Clinical Social Worker Direct Number:  [phone] [email or web address]   CAGE-AID Screening:    Have You Ever Felt You Ought to Cut Down on Your Drinking or Drug Use?: No Have People Annoyed You By [employer] Your Drinking Or Drug Use?: No Have You Felt Bad Or Guilty About Your Drinking Or Drug Use?: No Have You Ever Had a Drink or Used Drugs First Thing In The Morning to Steady Your Nerves or to Get Rid of a Hangover?: No CAGE-AID Score: 0  Substance Abuse Education Offered: No

## 2020-10-20 NOTE — Evaluation (Signed)
Physical Therapy Evaluation Patient Details Name: Kim Blankenship MRN: [id number] DOB: 1945/07/21 Today's Date: 10/20/2020   History of Present Illness  75 y/o female presented to ED on 7/21 with complaints of headache, dizziness, and feeling off balance. CT head negative. 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 PMH: Afib, COPD, HTN, CVA, HLD, depression  Clinical Impression  PTA, patient lives with daughter and reports independence with mobility. Patient denies any falls in last 3 months though states "my memory isn't too good". Evaluation limited by dizziness with mobility. Patient currently functioning at supervision-min guard for short mobility distance with no AD. Patient presents with generalized weakness, impaired balance, and decreased activity tolerance. Patient will benefit from skilled PT services during acute stay to address listed deficits. No PT follow up recommended at this time, anticipate patient to progress quickly once dizziness is resolved.     Follow Up Recommendations No PT follow up (anticipate progressing quickly once dizziness resolved)    Equipment Recommendations  None recommended by PT    Recommendations for Other Services       Precautions / Restrictions Precautions Precautions: Fall Precaution Comments: dizziness Restrictions Weight Bearing Restrictions: No      Mobility  Bed Mobility Overal bed mobility: Modified Independent                  Transfers Overall transfer level: Needs assistance Equipment used: None Transfers: Sit to/from Stand Sit to Stand: Supervision         General transfer comment: reports dizziness upon standing but no physical assistance to steady  Ambulation/Gait Ambulation/Gait assistance: Min guard Gait Distance (Feet): 20 Feet Assistive device: None Gait Pattern/deviations: Decreased stride length;Step-through pattern;Narrow base of support Gait velocity: decreased   General Gait  Details: min guard for safety. Distance limited by dizziness and patient requesting to return to supine. No overt LOB or unsteadiness noted during short ambulation  Stairs            Wheelchair Mobility    Modified Rankin (Stroke Patients Only) Modified Rankin (Stroke Patients Only) Pre-Morbid Rankin Score: No symptoms Modified Rankin: Moderately severe disability     Balance Overall balance assessment: Mild deficits observed, not formally tested                                           Pertinent Vitals/Pain Pain Assessment: 0-10 Pain Score: 2  Pain Location: R LE Pain Descriptors / Indicators: Aching Pain Intervention(s): Monitored during session    Home Living Family/patient expects to be discharged to:: Private residence Living Arrangements: Children Available Help at Discharge: Family;Available PRN/intermittently Type of Home: House Home Access: Stairs to enter Entrance Stairs-Rails: Right Entrance Stairs-Number of Steps: 2 Home Layout: One level Home Equipment: None      Prior Function Level of Independence: Independent         Comments: daughter drives, denies falls in last 3 months     Hand Dominance   Dominant Hand: Right    Extremity/Trunk Assessment   Upper Extremity Assessment Upper Extremity Assessment: Defer to OT evaluation    Lower Extremity Assessment Lower Extremity Assessment: Generalized weakness    Cervical / Trunk Assessment Cervical / Trunk Assessment: Normal  Communication   Communication: No difficulties  Cognition Arousal/Alertness: Awake/alert Behavior During Therapy: WFL for tasks assessed/performed Overall Cognitive Status: Within Functional Limits for tasks assessed  General Comments      Exercises     Assessment/Plan    PT Assessment Patient needs continued PT services  PT Problem List Decreased strength;Decreased activity  tolerance;Decreased balance;Decreased mobility       PT Treatment Interventions DME instruction;Gait training;Stair training;Functional mobility training;Therapeutic activities;Therapeutic exercise;Balance training;Patient/family education    PT Goals (Current goals can be found in the Care Plan section)  Acute Rehab PT Goals Patient Stated Goal: to go home PT Goal Formulation: With patient Time For Goal Achievement: 11/03/20 Potential to Achieve Goals: Good    Frequency Min 4X/week   Barriers to discharge        Co-evaluation               AM-PAC PT "6 Clicks" Mobility  Outcome Measure Help needed turning from your back to your side while in a flat bed without using bedrails?: None Help needed moving from lying on your back to sitting on the side of a flat bed without using bedrails?: None Help needed moving to and from a bed to a chair (including a wheelchair)?: A Little Help needed standing up from a chair using your arms (e.g., wheelchair or bedside chair)?: A Little Help needed to walk in hospital room?: A Little Help needed climbing 3-5 steps with a railing? : A Little 6 Click Score: 20    End of Session Equipment Utilized During Treatment: Gait belt Activity Tolerance: Patient limited by fatigue (limited by dizziness) Patient left: in bed;with call bell/phone within reach;with bed alarm set Nurse Communication: Mobility status PT Visit Diagnosis: Unsteadiness on feet (R26.81);Muscle weakness (generalized) (M62.81)    Time: [id number] PT Time Calculation (min) (ACUTE ONLY): 24 min   Charges:   PT Evaluation $PT Eval Moderate Complexity: 1 Mod          Kattie Santoyo A. Gilford Rile PT, DPT Acute Rehabilitation Services Pager [phone] Office [phone]   Linna Hoff 10/20/2020, 11:13 AM

## 2020-10-20 NOTE — Evaluation (Signed)
Speech Language Pathology Evaluation Patient Details Name: Kim Blankenship MRN: [id number] DOB: 1945-10-26 Today's Date: 10/20/2020 Time: [id number] SLP Time Calculation (min) (ACUTE ONLY): 22 min  Problem List:  Patient Active Problem List   Diagnosis Date Noted   Dizziness 10/19/2020   Unsteady gait 10/19/2020   Mass of upper lobe of left lung 10/19/2020   Leukocytosis 10/19/2020   Stroke-like symptoms    Pressure injury of skin 10/12/2020   COPD exacerbation ([place]) 10/11/2020   History of stroke [id number]   Acute metabolic encephalopathy [id number]   Acute respiratory failure with hypoxia ([place]) 07/23/2019   Acute encephalopathy 07/22/2019   Overdose opiate, accidental or unintentional, initial encounter ([place]) 07/22/2019   Vulvar cancer ([place]) 07/22/2019   Delirium    GIB (gastrointestinal bleeding) 12/04/2018   Chronic diastolic CHF (congestive heart failure) ([place]) 12/04/2018   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   Stroke (cerebrum) ([place]) 11/21/2016   Hyponatremia 11/20/2016   UTI (urinary tract infection) 11/20/2016   HLD (hyperlipidemia) 11/20/2016   GERD (gastroesophageal reflux disease) 11/20/2016   Depression 11/20/2016   Hypertensive emergency 11/04/2016   EtOH dependence ([place]) 11/04/2016   Hyperglycemia 11/04/2016   Essential hypertension    Hypertensive urgency 10/30/2016   PAF (paroxysmal atrial fibrillation) ([place]) 10/30/2016   PRES (posterior reversible encephalopathy syndrome) 10/30/2016   Seizure ([place])    Acute blood loss anemia    Closed fracture of right hip ([facility]) 02/09/2016   Chest pain 11/07/2015   COPD (chronic obstructive pulmonary disease) ([place]) 08/19/2013   Abnormal EKG 08/19/2013   Fever, unspecified 08/19/2013   Past Medical History:  Past Medical History:  Diagnosis Date   Asthma    Atrial fibrillation ([place])    Cancer ([place])    cervical   COPD (chronic obstructive pulmonary disease) ([place])     Hypertension    Irregular heart rate    PRES (posterior reversible encephalopathy syndrome)    Stroke [facility])    Past Surgical History:  Past Surgical History:  Procedure Laterality Date   CESAREAN SECTION     Left Foot Reconstruction     OPEN REDUCTION INTERNAL FIXATION (ORIF) METACARPAL Right 03/10/2015   Procedure: OPEN REDUCTION INTERNAL FIXATION (ORIF) RIGHT LONG  METACARPAL FRACTURE;  Surgeon: Leanora Cover, MD;  Location: [facility];  Service: Orthopedics;  Laterality: Right;   TONSILLECTOMY     TOTAL HIP ARTHROPLASTY Right 02/09/2016   Procedure: TOTAL HIP ARTHROPLASTY ANTERIOR APPROACH;  Surgeon: Frederik Pear, MD;  Location: [facility];  Service: Orthopedics;  Laterality: Right;   HPI:  Pt is a 75 y/o female presented to ED on 7/21 wiht complaints of headache, dizziness, and feeling off balance. CT head negative. 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 PMH: Afib, COPD, HTN, CVA, HLD, depression   Assessment / Plan / Recommendation Clinical Impression  Pt participated in speech/language/cognition evaluation. She reported that she is currently retired and resides with her daughter who assists some with medication management. However, pt stated that she is independent with financial management. Pt denied any baseline deficits in speech, or language, but she reported that she has some difficulty with memory due to general aging. Pt reported that she has had some acute difficulty with sequencing of events, memory, and word retrieval. She stated that these have improved, but may not be fully back to baseline. The [facility] Mental Status Examination was completed to evaluate the pt's cognitive-linguistic skills. She achieved a score of 23/30  which is below the normal limits of 27 or more out of 30 and is suggestive of a mild impairment. She exhibited difficulty in the areas of orientation, awareness, attention, memory, and executive function. Repetition was  necessary across multiple tasks due to inattention, and pt inconsistently self-corrected without cueing. Her speech and language skills were WNL. Skilled SLP services are clinically indicated at this time to improve cognitive-linguistic function.    SLP Assessment  SLP Recommendation/Assessment: Patient needs continued Speech Lanaguage Pathology Services SLP Visit Diagnosis: Cognitive communication deficit (R41.841)    Follow Up Recommendations  Outpatient SLP    Frequency and Duration min 2x/week  2 weeks      SLP Evaluation Cognition  Overall Cognitive Status: Impaired/Different from baseline Arousal/Alertness: Awake/alert Orientation Level: Oriented to person;Oriented to situation (Particially oriented to place and time; stated it is Thurs, July 21 and that she's in [place]) Attention: Focused;Sustained Focused Attention: Appears intact Sustained Attention: Impaired Sustained Attention Impairment: Verbal complex Memory: Impaired Memory Impairment: Retrieval deficit;Decreased recall of new information (Immediate: 55; delayed: 3/4 with cues: 2/2) Awareness: Impaired Awareness Impairment: Emergent impairment Problem Solving: Appears intact Executive Function: Sequencing;Organizing Sequencing: Impaired Sequencing Impairment: Verbal complex (Clock drawing: 2/4; with self-correction: 4/4) Organizing: Impaired Organizing Impairment: Verbal complex (Backward digit span: 0/2)       Comprehension  Auditory Comprehension Overall Auditory Comprehension: Appears within functional limits for tasks assessed Yes/No Questions: Within Functional Limits Commands: Within Functional Limits Conversation: Complex    Expression Expression Primary Mode of Expression: Verbal Verbal Expression Overall Verbal Expression: Appears within functional limits for tasks assessed Initiation: Impaired Level of Generative/Spontaneous Verbalization: Conversation Repetition: No impairment   Oral / Motor  Oral  Motor/Sensory Function Overall Oral Motor/Sensory Function: Within functional limits Motor Speech Overall Motor Speech: Appears within functional limits for tasks assessed Respiration: Within functional limits Phonation: Normal Resonance: Within functional limits Articulation: Within functional limitis Intelligibility: Intelligible Motor Planning: Witnin functional limits Motor Speech Errors: Not applicable   Rosalin Buster I. Hardin Negus, [place], [place] Office number [phone] Pager [place] 10/20/2020, 4:26 PM

## 2020-10-20 NOTE — Progress Notes (Signed)
PROGRESS NOTE                                                                                                                                                                                                             Patient Demographics:    Kim Blankenship, is a 75 y.o. female, DOB - March 09, 1946, [id number]  Outpatient Primary MD for the patient is Jolinda Croak, MD    LOS - 0  Admit date - 10/19/2020    Chief Complaint  Patient presents with   Dizziness   Nausea       Brief Narrative (HPI from H&P)  - Kim Blankenship is a 75 y.o. female with medical history significant of  COPD, CVA, chronic diastolic heart failure, paroxysmal atrial fibrillation not on anticoagulation due to history of GI bleed, hypertension, valvular intraepithelial neoplasm, history of seizures, hyperlipidemia and depression who presents with sudden onset of dizziness and feeling off balance that started this AM when she woke up, work-up suggested CVA and left upper lobe lung mass and she was admitted.   Subjective:    Kim Blankenship today has, No headache, No chest pain, No abdominal pain - No Nausea, No new weakness tingling or numbness, no cough shortness of breath possible mild sided weakness leg more than arm.   Assessment  & Plan :      Acute right paracentral gyrus infarct along with orthostatic hypotension due to dehydration causing dizziness and some left-sided weakness - she has been seen by neurology, stroke work-up will be deferred to neurology.  Currently on dual antiplatelet therapy and statin of note she has history of paroxysmal A. fib and not on anticoagulation due to GI bleed.  Will defer further work-up to neurology.  Since LDL was a above goal have adjusted statin dose A1c is stable.  2.  Paroxysmal A. fib [place] vas 2 score of greater than 4.  History of GI bleed hence chronically not on anticoagulation, supine blood  pressures are okay but she is orthostatic hence will apply TED stockings and hold off further blood pressure medications.  3.  Orthostatic hypotension with stable supine blood pressure.  TED stockings, check TSH and cortisol and monitor for  4.  Dyslipidemia.  Statin dose increased.  5.  History of GI bleed.  Placed on PPI.  6.  History  of anxiety and depression.  Continue home medications no acute issues.  7.  Incidental finding of rapidly enlarging left upper lobe mass 13 mm in size.  Will obtain pulmonary input.    Lab Results  Component Value Date   CHOL 161 10/20/2020   HDL 77 10/20/2020   LDLCALC 78 10/20/2020   TRIG 30 10/20/2020   CHOLHDL 2.1 10/20/2020    Lab Results  Component Value Date   HGBA1C 5.6 10/20/2020         Condition -   Guarded  Family Communication  :  called daughter [name] [phone] 10/20/20 @ 11.55 and 12.07  am > 12 rings no response  Code Status :  Full  Consults  :  Neuro, Renal, PCCM  PUD Prophylaxis :  PPI   Procedures  :     MRI - 1. Punctate acute infarct in the right precentral gyrus. Slight edema without mass effect. 2. Moderate chronic microvascular ischemic disease and mild atrophy. 3. Fluid layering in bilateral maxillary sinuses  CTA - Negative CT perfusion study. Atherosclerotic calcification at both carotid bifurcation and ICA bulbs but no stenosis. Atherosclerotic calcification of the proximal right vertebral artery with 30% stenosis. Atherosclerotic calcification in both carotid siphon regions but without stenosis greater than 30% suspected. No intracranial large vessel occlusion or correctable proximal stenosis. Enlarging mass in the left upper lobe, now up to 13 mm in size, worrisome for lung carcinoma.   TTE      Disposition Plan  :    Status is: Observation    Dispo: The patient is from: Home              Anticipated d/c is to: Home              Patient currently is not medically stable to d/c.   Difficult to  place patient No   DVT Prophylaxis  :    heparin injection 5,000 Units Start: 10/20/20 1400 SCD's Start: 10/19/20 1628    Lab Results  Component Value Date   PLT 210 10/20/2020    Diet :  Diet Order             Diet Heart Room service appropriate? Yes; Fluid consistency: Thin  Diet effective now                    Inpatient Medications  Scheduled Meds:   stroke: mapping our early stages of recovery book   Does not apply Once   aspirin EC  81 mg Oral Daily   atorvastatin  40 mg Oral QHS   busPIRone  10 mg Oral BID   clopidogrel  75 mg Oral Daily   ferrous sulfate  325 mg Oral BID WC   folic acid  1 mg Oral Daily   heparin injection (subcutaneous)  5,000 Units Subcutaneous Q8H   [START ON 10/21/2020] metoprolol tartrate  50 mg Oral BID   mometasone-formoterol  2 puff Inhalation BID   multivitamin with minerals  1 tablet Oral Daily   OLANZapine  2.5 mg Oral QHS   pantoprazole  40 mg Oral Daily   QUEtiapine  200 mg Oral QHS   thiamine  100 mg Oral Daily   umeclidinium bromide  1 puff Inhalation Daily   Continuous Infusions: PRN Meds:.acetaminophen **OR** acetaminophen (TYLENOL) oral liquid 160 mg/5 mL **OR** acetaminophen, albuterol, meclizine, metoprolol tartrate, senna-docusate  Antibiotics  :    Anti-infectives (From admission, onward)    None  Time Spent in minutes  30   Lala Lund M.D on 10/20/2020 at 12:00 PM  To page go to [email or web address]   [facility] -  Office  [phone]    See all Orders from today for further details    Objective:   Vitals:   10/19/20 2222 10/20/20 0412 10/20/20 0750 10/20/20 0906  BP: (!) 130/95 123/73 (!) 158/68   Pulse: 83 82 68 68  Resp: '18 18 18 18  '$ Temp: 98.5 F (36.9 C) 97.7 F (36.5 C) 97.8 F (36.6 C)   TempSrc: Oral Oral    SpO2: 96% 97% 98% 98%  Weight:      Height:        Wt Readings from Last 3 Encounters:  10/19/20 50.3 kg  08/30/20 49.9 kg  08/29/20 49.9 kg      Intake/Output Summary (Last 24 hours) at 10/20/2020 1200 Last data filed at 10/20/2020 [id number] Gross per 24 hour  Intake 50 ml  Output 1300 ml  Net -1250 ml     Physical Exam  Awake Alert, No new F.N deficits, mild L leg weakness .AT,PERRAL Supple Neck,No JVD, No cervical lymphadenopathy appriciated.  Symmetrical Chest wall movement, Good air movement bilaterally, CTAB RRR,No Gallops,Rubs or new Murmurs, No Parasternal Heave +ve B.Sounds, Abd Soft, No tenderness, No organomegaly appriciated, No rebound - guarding or rigidity. No Cyanosis, Clubbing or edema, No new Rash or bruise        RN pressure injury documentation: Pressure Injury 10/12/20 Buttocks Left;Right Stage 1 -  Intact skin with non-blanchable redness of a localized area usually over a bony prominence. (Active)  10/12/20 0111  Location: Buttocks  Location Orientation: Left;Right  Staging: Stage 1 -  Intact skin with non-blanchable redness of a localized area usually over a bony prominence.  Wound Description (Comments):   Present on Admission: Yes     Data Review:    CBC Recent Labs  Lab 10/19/20 1320 10/19/20 1331 10/20/20 0538  WBC 17.4*  --  10.7*  HGB 15.1* 15.0 12.3  HCT 43.4 44.0 35.8*  PLT 292  --  210  MCV 93.3  --  92.7  MCH 32.5  --  31.9  MCHC 34.8  --  34.4  RDW 12.1  --  12.2  LYMPHSABS 1.5  --   --   MONOABS 1.1*  --   --   EOSABS 0.0  --   --   BASOSABS 0.1  --   --     Recent Labs  Lab 10/19/20 1320 10/19/20 1331 10/20/20 0538  NA 132* 131* 134*  K 4.0 4.0 3.9  CL 95* 96* 104  CO2 27  --  22  GLUCOSE 104* 102* 112*  BUN 7* 6* 6*  CREATININE 0.84 0.70 0.68  CALCIUM 9.2  --  8.5*  AST 25  --   --   ALT 20  --   --   ALKPHOS 78  --   --   BILITOT 1.4*  --   --   ALBUMIN 3.8  --   --   INR 0.9  --   --   HGBA1C  --   --  5.6    ------------------------------------------------------------------------------------------------------------------ Recent Labs     10/20/20 0538  CHOL 161  HDL 77  LDLCALC 78  TRIG 30  CHOLHDL 2.1    Lab Results  Component Value Date   HGBA1C 5.6 10/20/2020   ------------------------------------------------------------------------------------------------------------------ No results for input(s): TSH, T4TOTAL, T3FREE, THYROIDAB in the last  72 hours.  Invalid input(s): FREET3  Cardiac Enzymes No results for input(s): CKMB, TROPONINI, MYOGLOBIN in the last 168 hours.  Invalid input(s): CK ------------------------------------------------------------------------------------------------------------------    Component Value Date/Time   BNP 42.1 10/11/2020 1642    Micro Results Recent Results (from the past 240 hour(s))  Resp Panel by RT-PCR (Flu A&B, Covid) Nasopharyngeal Swab     Status: None   Collection Time: 10/11/20  4:42 PM   Specimen: Nasopharyngeal Swab; Nasopharyngeal(NP) swabs in vial transport medium  Result Value Ref Range Status   SARS Coronavirus 2 by RT PCR NEGATIVE NEGATIVE Final    Comment: (NOTE) SARS-CoV-2 target nucleic acids are NOT DETECTED.  The SARS-CoV-2 RNA is generally detectable in upper respiratory specimens during the acute phase of infection. The lowest concentration of SARS-CoV-2 viral copies this assay can detect is 138 copies/mL. A negative result does not preclude SARS-Cov-2 infection and should not be used as the sole basis for treatment or other patient management decisions. A negative result may occur with  improper specimen collection/handling, submission of specimen other than nasopharyngeal swab, presence of viral mutation(s) within the areas targeted by this assay, and inadequate number of viral copies(<138 copies/mL). A negative result must be combined with clinical observations, patient history, and epidemiological information. The expected result is Negative.  Fact Sheet for Patients:  EntrepreneurPulse.com.au  Fact Sheet for Healthcare  Providers:  IncredibleEmployment.be  This test is no t yet approved or cleared by the [place] FDA and  has been authorized for detection and/or diagnosis of SARS-CoV-2 by FDA under an Emergency Use Authorization (EUA). This EUA will remain  in effect (meaning this test can be used) for the duration of the COVID-19 declaration under Section 564(b)(1) of the Act, 21 U.S.C.section 360bbb-3(b)(1), unless the authorization is terminated  or revoked sooner.       Influenza A by PCR NEGATIVE NEGATIVE Final   Influenza B by PCR NEGATIVE NEGATIVE Final    Comment: (NOTE) The Xpert Xpress SARS-CoV-2/FLU/RSV plus assay is intended as an aid in the diagnosis of influenza from Nasopharyngeal swab specimens and should not be used as a sole basis for treatment. Nasal washings and aspirates are unacceptable for Xpert Xpress SARS-CoV-2/FLU/RSV testing.  Fact Sheet for Patients: EntrepreneurPulse.com.au  Fact Sheet for Healthcare Providers: IncredibleEmployment.be  This test is not yet approved or cleared by the [place] FDA and has been authorized for detection and/or diagnosis of SARS-CoV-2 by FDA under an Emergency Use Authorization (EUA). This EUA will remain in effect (meaning this test can be used) for the duration of the COVID-19 declaration under Section 564(b)(1) of the Act, 21 U.S.C. section 360bbb-3(b)(1), unless the authorization is terminated or revoked.  Performed at [facility], [place] [address], [place], [place] [address]   Resp Panel by RT-PCR (Flu A&B, Covid) Nasopharyngeal Swab     Status: None   Collection Time: 10/19/20  1:20 PM   Specimen: Nasopharyngeal Swab; Nasopharyngeal(NP) swabs in vial transport medium  Result Value Ref Range Status   SARS Coronavirus 2 by RT PCR NEGATIVE NEGATIVE Final    Comment: (NOTE) SARS-CoV-2 target nucleic acids are NOT DETECTED.  The SARS-CoV-2 RNA is generally  detectable in upper respiratory specimens during the acute phase of infection. The lowest concentration of SARS-CoV-2 viral copies this assay can detect is 138 copies/mL. A negative result does not preclude SARS-Cov-2 infection and should not be used as the sole basis for treatment or other patient management decisions. A negative result may  occur with  improper specimen collection/handling, submission of specimen other than nasopharyngeal swab, presence of viral mutation(s) within the areas targeted by this assay, and inadequate number of viral copies(<138 copies/mL). A negative result must be combined with clinical observations, patient history, and epidemiological information. The expected result is Negative.  Fact Sheet for Patients:  EntrepreneurPulse.com.au  Fact Sheet for Healthcare Providers:  IncredibleEmployment.be  This test is no t yet approved or cleared by the [place] FDA and  has been authorized for detection and/or diagnosis of SARS-CoV-2 by FDA under an Emergency Use Authorization (EUA). This EUA will remain  in effect (meaning this test can be used) for the duration of the COVID-19 declaration under Section 564(b)(1) of the Act, 21 U.S.C.section 360bbb-3(b)(1), unless the authorization is terminated  or revoked sooner.       Influenza A by PCR NEGATIVE NEGATIVE Final   Influenza B by PCR NEGATIVE NEGATIVE Final    Comment: (NOTE) The Xpert Xpress SARS-CoV-2/FLU/RSV plus assay is intended as an aid in the diagnosis of influenza from Nasopharyngeal swab specimens and should not be used as a sole basis for treatment. Nasal washings and aspirates are unacceptable for Xpert Xpress SARS-CoV-2/FLU/RSV testing.  Fact Sheet for Patients: EntrepreneurPulse.com.au  Fact Sheet for Healthcare Providers: IncredibleEmployment.be  This test is not yet approved or cleared by the [place] FDA  and has been authorized for detection and/or diagnosis of SARS-CoV-2 by FDA under an Emergency Use Authorization (EUA). This EUA will remain in effect (meaning this test can be used) for the duration of the COVID-19 declaration under Section 564(b)(1) of the Act, 21 U.S.C. section 360bbb-3(b)(1), unless the authorization is terminated or revoked.  Performed at [facility], [place] [address] [place], [place] [address]     Radiology Reports DG Chest 2 View  Result Date: 09/30/2020 CLINICAL DATA:  SHORTNESS OF BREATH. HEADACHE PAIN 5 DAYS HISTORY OF COPD. EXAM: CHEST - 2 VIEW COMPARISON:  08/29/2020 FINDINGS: Lungs are mildly hyperinflated. Heart size is normal. There is a small LEFT pleural effusion. No focal consolidations or pulmonary edema. Numerous chronic thoracic wedge compression fractures. IMPRESSION: Small LEFT effusion. Electronically Signed   By: Nolon Nations M.D.   On: 09/30/2020 10:50   MR BRAIN WO CONTRAST  Result Date: 10/19/2020 CLINICAL DATA:  Neuro deficit, acute stroke suspected. EXAM: MRI HEAD WITHOUT CONTRAST TECHNIQUE: Multiplanar, multiecho pulse sequences of the brain and surrounding structures were obtained without intravenous contrast. COMPARISON:  Same day CT.  MRI 10/30/2016. FINDINGS: Brain: Punctate acute infarct in the right precentral gyrus (series 3, image 41). Slight edema without mass effect. No acute hemorrhage, mass lesion, midline shift, hydrocephalus, or extra-axial fluid collection. Mild atrophy with ex vacuo ventricular dilation. Moderate scattered T2/FLAIR hyperintensities within the white matter, most likely related to chronic microvascular ischemic disease. Vascular: Major arterial flow voids are maintained at the skull base. Vasculature is further characterized on same day CTA. Skull and upper cervical spine: Normal marrow signal. Degenerative changes of the imaged upper cervical spine. Sinuses/Orbits: Fluid layering in bilateral maxillary  sinuses. Unremarkable orbits. Other: No sizable mastoid effusions. IMPRESSION: 1. Punctate acute infarct in the right precentral gyrus. Slight edema without mass effect. 2. Moderate chronic microvascular ischemic disease and mild atrophy. 3. Fluid layering in bilateral maxillary sinuses. Electronically Signed   By: Margaretha Sheffield MD   On: 10/19/2020 18:01   DG Chest Port 1 View  Result Date: 10/19/2020 CLINICAL DATA:  Stroke. EXAM: PORTABLE CHEST 1 VIEW COMPARISON:  10/11/2020. FINDINGS: The cardiomediastinal contours are normal. Minor streaky left lung base atelectasis, new. Pulmonary vasculature is normal. No confluent consolidation, pleural effusion, or pneumothorax. No acute osseous abnormalities are seen. IMPRESSION: Minor streaky left lung base atelectasis. Electronically Signed   By: Keith Rake M.D.   On: 10/19/2020 15:55   DG Chest Portable 1 View  Result Date: 10/11/2020 CLINICAL DATA:  Shortness of breath EXAM: PORTABLE CHEST 1 VIEW COMPARISON:  September 30, 2020 FINDINGS: The heart size and mediastinal contours are within normal limits. Both lungs are clear. The visualized skeletal structures are unremarkable. IMPRESSION: No active disease. Electronically Signed   By: Dahlia Bailiff MD   On: 10/11/2020 17:21   CT HEAD CODE STROKE WO CONTRAST  Result Date: 10/19/2020 CLINICAL DATA:  Code stroke. Neurological deficit. Acute stroke suspected. Vertigo. Last seen normal 1030 hours. EXAM: CT HEAD WITHOUT CONTRAST TECHNIQUE: Contiguous axial images were obtained from the base of the skull through the vertex without intravenous contrast. COMPARISON:  04/25/2020 FINDINGS: Brain: The study suffers from some motion degradation. No sign of acute infarction, mass lesion, hemorrhage, hydrocephalus or extra-axial collection. There are mild chronic small-vessel ischemic changes of the hemispheric white matter. Vascular: There is atherosclerotic calcification of the major vessels at the base of the brain.  Skull: Negative Sinuses/Orbits: Fluid levels in both maxillary sinuses consistent with rhinosinusitis. Orbits negative. Other: None ASPECTS ([place] Stroke Program Early CT Score) - Ganglionic level infarction (caudate, lentiform nuclei, internal capsule, insula, M1-M3 cortex): 7 - Supraganglionic infarction (M4-M6 cortex): 3 Total score (0-10 with 10 being normal): 10 IMPRESSION: 1. No acute CT finding. Mild chronic small-vessel ischemic change of the hemispheric white matter. 2. ASPECTS is 10. 3. These results were communicated to Dr. Quinn Axe At 1:35 pm on 10/19/2020 by text page via the [facility] messaging system. Electronically Signed   By: Nelson Chimes M.D.   On: 10/19/2020 13:37   CT ANGIO HEAD NECK W WO CM W PERF (CODE STROKE)  Result Date: 10/19/2020 CLINICAL DATA:  Code stroke. Neurological deficit. Vertigo. No acute CT finding. EXAM: CT ANGIOGRAPHY HEAD AND NECK CT PERFUSION BRAIN TECHNIQUE: Multidetector CT imaging of the head and neck was performed using the standard protocol during bolus administration of intravenous contrast. Multiplanar CT image reconstructions and MIPs were obtained to evaluate the vascular anatomy. Carotid stenosis measurements (when applicable) are obtained utilizing NASCET criteria, using the distal internal carotid diameter as the denominator. Multiphase CT imaging of the brain was performed following IV bolus contrast injection. Subsequent parametric perfusion maps were calculated using RAPID software. CONTRAST:  123m OMNIPAQUE IOHEXOL 350 MG/ML SOLN COMPARISON:  Head CT earlier same day. FINDINGS: CTA NECK FINDINGS Aortic arch: Aortic atherosclerosis and tortuosity. Branching pattern is normal without origin stenosis. Right carotid system: Common carotid artery widely patent to the bifurcation. Calcified plaque at the ICA bulb and bifurcation but no stenosis. Cervical ICA is tortuous but widely patent. Left carotid system: Common carotid artery widely patent to the bifurcation.  Calcified plaque at carotid bifurcation and ICA bulb but no stenosis. Cervical ICA is tortuous but widely patent. Vertebral arteries: Both vertebral artery origins patent. Some atherosclerotic calcification at the proximal right vertebral artery but no stenosis greater than 30%. Both vertebral arteries patent beyond that through the cervical region to the foramen magnum. Skeleton: Cervical spondylosis and facet osteoarthritis. Degenerative change at the C1-2 articulation on the right which could cause craniocervical pain syndromes. Other neck: No neck mass or lymphadenopathy. Upper chest: Emphysema and pulmonary scarring.  Mass lesion in the posterior aspect of the left upper lobe measuring 7 x 13 mm in size. This has enlarged considerably since May of 2021 and is worrisome for lung carcinoma. Review of the MIP images confirms the above findings CTA HEAD FINDINGS Anterior circulation: Both internal carotid arteries are patent through the skull base and siphon regions. There is ordinary siphon atherosclerotic calcification but no stenosis greater than 30% suspected. The anterior and middle cerebral vessels are patent. No large vessel occlusion. No correctable proximal stenosis. No aneurysm or vascular malformation. Posterior circulation: Both vertebral arteries widely patent through the foramen magnum to the basilar. No basilar stenosis. Posterior circulation branch vessels are normal. Venous sinuses: Patent and normal. Anatomic variants: None significant. Review of the MIP images confirms the above findings CT Brain Perfusion Findings: ASPECTS: 10 CBF (<30%) Volume: 27m Perfusion (Tmax>6.0s) volume: 086mMismatch Volume: 0ML Infarction Location:None IMPRESSION: Negative CT perfusion study. Atherosclerotic calcification at both carotid bifurcation and ICA bulbs but no stenosis. Atherosclerotic calcification of the proximal right vertebral artery with 30% stenosis. Atherosclerotic calcification in both carotid siphon  regions but without stenosis greater than 30% suspected. No intracranial large vessel occlusion or correctable proximal stenosis. Enlarging mass in the left upper lobe, now up to 13 mm in size, worrisome for lung carcinoma. PET CT or tissue sampling suggested. Electronically Signed   By: MaNelson Chimes.D.   On: 10/19/2020 14:05

## 2020-10-20 NOTE — Progress Notes (Signed)
Occupational Therapy Evaluation Patient Details Name: Kim Blankenship MRN: [id number] DOB: 1945/04/06 Today's Date: 10/20/2020    History of Present Illness 75 y/o female presented to ED on 7/21 wiht complaints of headache, dizziness, and feeling off balance. CT head negative. 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 PMH: Afib, COPD, HTN, CVA, HLD, depression   Clinical Impression   PTA pt lives independently with her daughter, who drives and completes IADL tasks and assists her Mom as needed. Pt initially sleepy but willing to participate. Modified independent with bed mobility however increased complaints of dizziness in sitting. Noted pt is orthostatic with increased complaints of dizziness with standing. Sitting: 133/75; standing 111/68. Pt reports decreased activity tolerance. Will follow acutely for education on energy conservation and strategies to reduce risk of falls. Do not anticipate need for OT follow up.    Follow Up Recommendations  No OT follow up;Supervision - Intermittent    Equipment Recommendations  3 in 1 bedside commode (to use as shower seat)    Recommendations for Other Services PT consult     Precautions / Restrictions Precautions Precautions: Fall Precaution Comments: orthostatic      Mobility Bed Mobility Overal bed mobility: Modified Independent                  Transfers Overall transfer level: Needs assistance   Transfers: Sit to/from Stand Sit to Stand: Min guard              Balance Overall balance assessment: Mild deficits observed, not formally tested (impacted by feeling dizzy)                                         ADL either performed or assessed with clinical judgement   ADL Overall ADL's : Needs assistance/impaired                                     Functional mobility during ADLs: Min guard General ADL Comments: Minguard for LB dressing/bathing due to feeling  dizzy; set up with UB ADL. Minguard for ambulation to toilet     Vision Baseline Vision/History: Wears glasses Wears Glasses: Reading only Patient Visual Report: No change from baseline Additional Comments: Pt initially complaining of double vision. Feel pt was experiencing eye fatigue as initially drowsy. Once fully awake, no complaints of diplopia     Perception     Praxis      Pertinent Vitals/Pain Pain Assessment: No/denies pain     Hand Dominance Right   Extremity/Trunk Assessment Upper Extremity Assessment Upper Extremity Assessment: Overall WFL for tasks assessed   Lower Extremity Assessment Lower Extremity Assessment: Defer to PT evaluation   Cervical / Trunk Assessment Cervical / Trunk Assessment: Normal   Communication Communication Communication: No difficulties   Cognition Arousal/Alertness: Awake/alert Behavior During Therapy: WFL for tasks assessed/performed Overall Cognitive Status: Within Functional Limits for tasks assessed                                     General Comments       Exercises     Shoulder Instructions      Home Living Family/patient expects to be discharged to:: Private residence Living Arrangements: Children Available Help  at Discharge: Family;Available PRN/intermittently Type of Home: House Home Access: Stairs to enter [employer] of Steps: 2 Entrance Stairs-Rails: Right Home Layout: One level     Bathroom Shower/Tub: Teacher, early years/pre: Standard Bathroom Accessibility: Yes How Accessible: Accessible via walker Home Equipment: None          Prior Functioning/Environment Level of Independence: Independent        Comments: daughter drives, denies falls in last 3 months; Is at home alone while daughter works        OT Problem List: Impaired balance (sitting and/or standing);Cardiopulmonary status limiting activity;Decreased safety awareness;Decreased activity  tolerance      OT Treatment/Interventions: Self-care/ADL training;Energy conservation;DME and/or AE instruction;Therapeutic activities;Patient/family education;Balance training    OT Goals(Current goals can be found in the care plan section) Acute Rehab OT Goals Patient Stated Goal: to feel better OT Goal Formulation: With patient Time For Goal Achievement: 11/03/20 Potential to Achieve Goals: Good  OT Frequency: Min 2X/week   Barriers to D/C:            Co-evaluation              AM-PAC OT "6 Clicks" Daily Activity     Outcome Measure Help from another person eating meals?: None Help from another person taking care of personal grooming?: A Little Help from another person toileting, which includes using toliet, bedpan, or urinal?: A Little Help from another person bathing (including washing, rinsing, drying)?: A Little Help from another person to put on and taking off regular upper body clothing?: A Little Help from another person to put on and taking off regular lower body clothing?: A Little 6 Click Score: 19   End of Session Equipment Utilized During Treatment: Gait belt Nurse Communication: Mobility status;Other (comment) (pr orthostatic)  Activity Tolerance: Patient tolerated treatment well Patient left: in bed;with call bell/phone within reach;with bed alarm set  OT Visit Diagnosis: Unsteadiness on feet (R26.81);Dizziness and giddiness (R42)                Time: [id number] OT Time Calculation (min): 22 min Charges:     Maurie Boettcher, OT/L   Acute OT Clinical Specialist Acute Rehabilitation Services Pager [phone] Office [phone]   [facility] 10/20/2020, 11:09 AM

## 2020-10-20 NOTE — Consult Note (Signed)
NAME:  Kim Blankenship, MRN:  [id number], DOB:  06-12-1945, LOS: 0 ADMISSION DATE:  10/19/2020, CONSULTATION DATE:  10/20/20 REFERRING MD:  Candiss Norse - [facility] , CHIEF COMPLAINT:  consult for Lung mass   History of Present Illness:  75 yo F PMH Afib not on AC due to prior GIB, Vulvar dysplasia s/p skinning vulvectomy, HLD, HTN, COPD, prior tobacco use, PRES presented to ED 7/21 with CC dizziness. LKW 7/20 2000. Code stroke initiated, CT H without acute abnormality, CTP normal, CTA with atherosclerotis bilateral carotid bifurcation, ICA bulbs, R vertebral artery with 30% stenosis. MRI with acute punctate infarct in R precentral gyrus.   On CTA head/neck, a 36m nodule in LUL was incidentally identified. 07/2019 this lesion was 680m PCCM is consulted in this setting.   In review of chart, also looks like pt has had fairly frequent AECOPD. Has not seen Pulmonologist since 2020.   Pertinent  Medical History  Vulvar cancer  HTN COPD PRES HLD Afib  GIB Diastolic HF Prior CVA  Seizure  Prior tobacco use (3040yr  Significant Hospital Events: Including procedures, antibiotic start and stop dates in addition to other pertinent events   [facility]. LUL nodule enlarged to 42m72m/22 ccm consulted   Interim History / Subjective:  PCCM consulted     Objective   Blood pressure (!) 158/68, pulse 68, temperature 97.8 F (36.6 C), resp. rate 18, height '5\' 4"'$  (1.626 m), weight 50.3 kg, SpO2 98 %.        Intake/Output Summary (Last 24 hours) at 10/20/2020 1210 Last data filed at 10/20/2020 [id number] per 24 hour  Intake 50 ml  Output 1300 ml  Net -1250 ml   Filed Weights   10/19/20 2133  Weight: 50.3 kg    Examination: General: older adult female, reclined in bed NAD  HENT: NCAT pink mm anicteric sclera  Lungs: Wet cough. Symmetrical chest expansion, even and unlabored  Cardiovascular: rr cap refill brisk  Abdomen: soft flat ndnt   Extremities: No acute joint deformity. No cyanosis  Neuro: AAOx4 following commands.  GU: defer   Resolved Hospital Problem list     Assessment & Plan:   LUL Lung nodule -prior tobacco use, 75yr39yrodule has increased in size from 6mm i76m/2021 to 42mm 760m2 (seen on CTA) -When I talked to the pt about this she was very apprehensive about working up this nodule at all.  P -d/w PCCM physician who will follow up on if pt will accept a CT chest. I will defer ordering at this time   COPD without acute exacerbation -has had several acute exacerbations in 2022 however -outpt dulera, combivent, albuterol.  -looks like has seen Dr. Byrum iLamonte Sakai0 but not since P -albuterol, dulera, incruse ellipta  -recommend outpt follow up. Patient is not sure if she wants to so I will hold off of facilitating scheduling at this time.  -follow up with pt about this   Goals of Care DNR Status -Pretty long talk with the patient. She shares that at 75 year15old she is not sure "what difference it would make" to pursue workup of medical problems. Does not want to see several outpatient doctors.  -We talked about what she would want her medical care to look like if she were calling all the shots. She says she would just want to be at home watching TV. I asked if she was familiar with  the term code status. She says yeah DNR. I clarified with her what DNR means. She said "Yes Do not resuscitate, I already am DNR." I shared that her current code status is listed as Full Code but I can change this to DNR if she wishes, which she said yes.  -we talked about different approaches to medical care in general comparing disease management with symptom management. She is open to a palliative care consult while inpatient to help establish goals of care.  P -DNR -I will place palliative care consult   Best Practice (right click and "Reselect all SmartList Selections" daily)   Diet/type: Regular consistency (see  orders) DVT prophylaxis: prophylactic heparin  GI prophylaxis: PPI Lines: N/A Foley:  N/A Code Status:  DNR Last date of multidisciplinary goals of care discussion [per primary]  Labs   CBC: Recent Labs  Lab 10/19/20 1320 10/19/20 1331 10/20/20 0538  WBC 17.4*  --  10.7*  NEUTROABS 14.7*  --   --   HGB 15.1* 15.0 12.3  HCT 43.4 44.0 35.8*  MCV 93.3  --  92.7  PLT 292  --  [id number]    Basic Metabolic Panel: Recent Labs  Lab 10/19/20 1320 10/19/20 1331 10/20/20 0538  NA 132* 131* 134*  K 4.0 4.0 3.9  CL 95* 96* 104  CO2 27  --  22  GLUCOSE 104* 102* 112*  BUN 7* 6* 6*  CREATININE 0.84 0.70 0.68  CALCIUM 9.2  --  8.5*   GFR: Estimated Creatinine Clearance: 49.7 mL/min (by C-G formula based on SCr of 0.68 mg/dL). Recent Labs  Lab 10/19/20 1320 10/20/20 0538  WBC 17.4* 10.7*    Liver Function Tests: Recent Labs  Lab 10/19/20 1320  AST 25  ALT 20  ALKPHOS 78  BILITOT 1.4*  PROT 6.6  ALBUMIN 3.8   No results for input(s): LIPASE, AMYLASE in the last 168 hours. No results for input(s): AMMONIA in the last 168 hours.  ABG    Component Value Date/Time   PHART 7.238 (L) 10/30/2016 0017   PCO2ART 43.9 10/30/2016 0017   PO2ART 441.0 (H) 10/30/2016 0017   HCO3 26.6 10/11/2020 2037   TCO2 26 10/19/2020 1331   ACIDBASEDEF 3.0 (H) 08/30/2020 0434   O2SAT 89.0 10/11/2020 2037     Coagulation Profile: Recent Labs  Lab 10/19/20 1320  INR 0.9    Cardiac Enzymes: No results for input(s): CKTOTAL, CKMB, CKMBINDEX, TROPONINI in the last 168 hours.  HbA1C: Hgb A1c MFr Bld  Date/Time Value Ref Range Status  10/20/2020 05:38 AM 5.6 4.8 - 5.6 % Final    Comment:    (NOTE) Pre diabetes:          5.7%-6.4%  Diabetes:              >6.4%  Glycemic control for   <7.0% adults with diabetes   10/30/2016 04:55 AM 5.4 4.8 - 5.6 % Final    Comment:    (NOTE)         Pre-diabetes: 5.7 - 6.4         Diabetes: >6.4         Glycemic control for adults with  diabetes: <7.0     CBG: No results for input(s): GLUCAP in the last 168 hours.  Review of Systems:   Review of Systems  Constitutional: Negative.   HENT: Negative.    Eyes: Negative.   Respiratory:  Positive for cough. Negative for hemoptysis, sputum production, shortness of breath and  wheezing.   Cardiovascular: Negative.   Gastrointestinal: Negative.   Genitourinary: Negative.   Musculoskeletal: Negative.   Skin: Negative.   Neurological:  Positive for dizziness and weakness. Negative for tingling, tremors, focal weakness, seizures, loss of consciousness and headaches.  Endo/Heme/Allergies: Negative.   Psychiatric/Behavioral:  Positive for depression. Negative for hallucinations, substance abuse and suicidal ideas. The patient is nervous/anxious. The patient does not have insomnia.     Past Medical History:  She,  has a past medical history of Asthma, Atrial fibrillation ([place]), Cancer ([place]), COPD (chronic obstructive pulmonary disease) ([place]), Hypertension, Irregular heart rate, PRES (posterior reversible encephalopathy syndrome), and Stroke ([place]).   Surgical History:   Past Surgical History:  Procedure Laterality Date   CESAREAN SECTION     Left Foot Reconstruction     OPEN REDUCTION INTERNAL FIXATION (ORIF) METACARPAL Right 03/10/2015   Procedure: OPEN REDUCTION INTERNAL FIXATION (ORIF) RIGHT LONG  METACARPAL FRACTURE;  Surgeon: Leanora Cover, MD;  Location: [facility];  Service: Orthopedics;  Laterality: Right;   TONSILLECTOMY     TOTAL HIP ARTHROPLASTY Right 02/09/2016   Procedure: TOTAL HIP ARTHROPLASTY ANTERIOR APPROACH;  Surgeon: Frederik Pear, MD;  Location: [facility];  Service: Orthopedics;  Laterality: Right;     Social History:   reports that she quit smoking about 25 years ago. Her smoking use included cigarettes. She has a 60.00 pack-year smoking history. She has never used smokeless tobacco. She reports current alcohol use. She reports that she does not  use drugs.   Family History:  Her family history includes Dementia in her father.   Allergies Allergies  Allergen Reactions   Levofloxacin     Other reaction(s): Other Leg pain   Tape Other (See Comments)    THE PATIENT'S SKIN IS THIN AND TEARS VERY EASILY (she "picks" at it; please use coban wrap)     Home Medications  Prior to Admission medications   Medication Sig Start Date End Date Taking? Authorizing Provider  albuterol (VENTOLIN HFA) 108 (90 Base) MCG/ACT inhaler Inhale 2 puffs into the lungs every 4 (four) hours as needed for wheezing or shortness of breath. 09/30/20  Yes Isla Pence, MD  amLODipine (NORVASC) 2.5 MG tablet Take 2.5 mg by mouth daily.   Yes [provider]  aspirin EC 81 MG tablet Take 81 mg by mouth daily.   Yes [provider]  atorvastatin (LIPITOR) 20 MG tablet Take 20 mg by mouth at bedtime. 04/08/16  Yes [provider]  busPIRone (BUSPAR) 10 MG tablet Take 10 mg by mouth 2 (two) times daily. 01/29/20  Yes [provider]  calcium carbonate (TUMS - DOSED IN MG ELEMENTAL CALCIUM) 500 MG chewable tablet Chew 1 tablet by mouth daily as needed for indigestion or heartburn.   Yes [provider]  ferrous sulfate 325 (65 FE) MG tablet Take 1 tablet (325 mg total) by mouth 2 (two) times daily with a meal. 12/10/18  Yes Hongalgi, Lenis Dickinson, MD  folic acid (FOLVITE) 1 MG tablet Take 1 tablet (1 mg total) by mouth daily. 12/11/18  Yes Hongalgi, Lenis Dickinson, MD  metoprolol tartrate (LOPRESSOR) 50 MG tablet Take 50 mg by mouth 2 (two) times daily. 01/29/20  Yes [provider]  Multiple Vitamin (MULTIVITAMIN WITH MINERALS) TABS tablet Take 1 tablet by mouth daily. 12/11/18  Yes Hongalgi, Lenis Dickinson, MD  OLANZapine (ZYPREXA) 2.5 MG tablet Take 2.5 mg by mouth at bedtime.   Yes [provider]  QUEtiapine (SEROQUEL) 100  MG tablet Take 200 mg by mouth at bedtime.    Yes [provider]  spironolactone (ALDACTONE)  25 MG tablet Take 25 mg by mouth daily.   Yes [provider]  thiamine 100 MG tablet Take 1 tablet (100 mg total) by mouth daily. 12/11/18  Yes Hongalgi, Lenis Dickinson, MD  azithromycin (ZITHROMAX) 250 MG tablet Take 250-500 mg by mouth See admin instructions. Take 500 mg by mouth on the first day, then take 250 mg daily until gone. Patient not taking: No sig reported    [provider]  chlorpheniramine-HYDROcodone (TUSSIONEX PENNKINETIC ER) 10-8 MG/5ML SUER Take 5 mLs by mouth every 12 (twelve) hours as needed for cough. Patient not taking: No sig reported 09/30/20   Isla Pence, MD  doxycycline (VIBRAMYCIN) 100 MG capsule Take 1 capsule (100 mg total) by mouth 2 (two) times daily. Patient not taking: No sig reported 09/30/20   Isla Pence, MD  Ipratropium-Albuterol (COMBIVENT RESPIMAT) 20-100 MCG/ACT AERS respimat Inhale 1 puff into the lungs every 6 (six) hours. Patient not taking: No sig reported 09/30/20   Isla Pence, MD  mometasone-formoterol [facility]) 200-5 MCG/ACT AERO Inhale 2 puffs into the lungs 2 (two) times daily. Patient not taking: No sig reported 10/12/20   Charlynne Cousins, MD  predniSONE (DELTASONE) 10 MG tablet Takes 6 tablets for 1 days, then 5 tablets for 1 days, then 4 tablets for 1 days, then 3 tablets for 1 days, then 2 tabs for 1 days, then 1 tab for 1 days, and then stop. Patient not taking: No sig reported 10/12/20   Charlynne Cousins, MD  tiotropium (SPIRIVA HANDIHALER) 18 MCG inhalation capsule Place 1 capsule (18 mcg total) into inhaler and inhale daily. Patient not taking: No sig reported 10/12/20 10/12/21  Charlynne Cousins, MD  tiZANidine (ZANAFLEX) 2 MG tablet Take 2 mg by mouth at bedtime.    [provider]     Critical care time: n/a

## 2020-10-20 NOTE — Consult Note (Addendum)
Palliative Medicine Inpatient Consult Note  Consulting Provider: Eliseo Gum  Reason for consult:  Goals of Care  HPI:  Per intake H&P --> Kim Blankenship is a 76 y.o. female with medical history significant of  COPD, CVA, chronic diastolic heart failure, paroxysmal atrial fibrillation not on anticoagulation due to history of GI bleed, hypertension, valvular intraepithelial neoplasm, history of seizures, hyperlipidemia and depression who presents with sudden onset of dizziness and feeling off balance that started this AM when she woke up, work-up suggested CVA and left upper lobe lung mass and she was admitted.  Palliative care was to see Kim Blankenship to further address goals of care.  Clinical Assessment/Goals of Care:  *Please note that this is a verbal dictation therefore any spelling or grammatical errors are due to the "[employer] [employer]" system interpretation.  I have reviewed medical records including EPIC notes, labs and imaging, received report from bedside RN, assessed the patient who is lying in bed in NAD.    I met with Kim Blankenship to further discuss diagnosis prognosis, GOC, EOL wishes, disposition and options.  A brief review of Kim Blankenship PMH was had. Discussed her h/o CVA, COPD, dHF, and pAF. She shares with me that she has a mass in her chest that has "grown three time faster than it should." She expresses presently being worked up for "this thing in my head". WE also reviewed the left upper lobe lung mass that has been identified.    I introduced Palliative Medicine as specialized medical care for people living with serious illness. It focuses on providing relief from the symptoms and stress of a serious illness. The goal is to improve quality of life for both the patient and the family.  Kim Blankenship shares with me that she is from [place] originally. She lived in [place] for a period of time as she initially graduated with a Engineer, maintenance (IT) and worked for  [employer]. 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 She is divorced. She has one daughter, Kim Blankenship and two grandchildren. She very much values spending time with them. She is a woman who considers herself to have a personal relationship with god though ascribes to no specific faith.  Prior to hospitalization Kim Blankenship had been living independently though her daughter lives with her. Kim Blankenship was able to complete all bADL's and iADL's on her own.   A detailed discussion was had today regarding advanced directives patient is interested in doing this, she would like to discuss it with her daughter.   Concepts specific to code status, artifical feeding and hydration, continued IV antibiotics and rehospitalization was had.  A MOST form was introduced and completed as below:  Cardiopulmonary Resuscitation: Do Not Attempt Resuscitation (DNR/No CPR)  Medical Interventions: Limited Additional Interventions: Use medical treatment, IV fluids and cardiac monitoring as indicated, DO NOT USE intubation or mechanical ventilation. May consider use of less invasive airway support such as BiPAP or CPAP. Also provide comfort measures. Transfer to the hospital if indicated. Avoid intensive care.   Antibiotics: Determine use of limitation of antibiotics when infection occurs  IV Fluids: IV fluids for a defined trial period  Feeding Tube: No feeding tube   Reviewed patients fears in regards to possible diagnosis. Reviewed that she desires to gain further insigths from the pulmonology team and neurology teams regarding her present conditions. She has some fears regarding potential of biopsy it seems so these will need to be further explored.  Discussed the importance  of continued conversation with family and their  medical providers regarding overall plan of care and treatment options, ensuring decisions are within the context of the patients values and GOCs.  Decision Maker: Kim Blankenship  (daughter) [phone]  SUMMARY OF RECOMMENDATIONS   DNAR/DNI  MOST Completed, paper copy placed onto the chart electric copy can be found in [facility]  DNR Form Completed, paper copy placed onto the chart electric copy can be found in [facility]  Patient would like to complete Advance Directives  Patient would like to continue to gain the insights of Pulmonology in the oncoming days to further identify if she would want to pursue a biopsy of her lung mass  Patient would benefit greatly from her daughters presence in terms of her making decisions related to additional diagnostics and treatment options  Ongoing support  Code Status/Advance Care Planning: DNAR/DNI   Palliative Prophylaxis:  Oral Care, Mobility  Additional Recommendations (Limitations, Scope, Preferences): Continue to treat was is treatable   Psycho-social/Spiritual:  Desire for further Chaplaincy support: No Additional Recommendations: Education on chronic disease and acute medical processes   Prognosis: Unclear  Discharge Planning: Unclear  Vitals:   10/20/20 0750 10/20/20 0906  BP: (!) 158/68   Pulse: 68 68  Resp: 18 18  Temp: 97.8 F (36.6 C)   SpO2: 98% 98%    Intake/Output Summary (Last 24 hours) at 10/20/2020 1346 Last data filed at 10/20/2020 5072 Gross per 24 hour  Intake 50 ml  Output 1300 ml  Net -1250 ml   Last Weight  Most recent update: 10/19/2020  9:37 PM    Weight  50.3 kg (111 lb)            Gen:  Older caucasian F in NAD HEENT: moist mucous membranes CV: Regular rate and rhythm, no murmurs rubs or gallops PULM: clear to auscultation bilaterally ABD: soft/nontender/ EXT: No edema Neuro: Alert and oriented x3  PPS:   This conversation/these recommendations were discussed with patient primary care team, Dr. Candiss Norse  Time In: 0350 Time Out: 1500 Total Time: 70 Greater than 50%  of this time was spent counseling and coordinating care related to the above assessment and  plan.  [place] Team Team Cell Phone: [phone] Please utilize secure chat with additional questions, if there is no response within 30 minutes please call the above phone number  Palliative Medicine Team providers are available by phone from 7am to 7pm daily and can be reached through the team cell phone.  Should this patient require assistance outside of these hours, please call the patient's attending physician.

## 2020-10-21 ENCOUNTER — Inpatient Hospital Stay (HOSPITAL_COMMUNITY): Payer: Medicare Other

## 2020-10-21 DIAGNOSIS — R42 Dizziness and giddiness: Secondary | ICD-10-CM | POA: Diagnosis not present

## 2020-10-21 DIAGNOSIS — I6389 Other cerebral infarction: Secondary | ICD-10-CM

## 2020-10-21 DIAGNOSIS — I6340 Cerebral infarction due to embolism of unspecified cerebral artery: Secondary | ICD-10-CM | POA: Insufficient documentation

## 2020-10-21 LAB — ECHOCARDIOGRAM COMPLETE
AR max vel: 3.79 cm2
AV Area VTI: 4.08 cm2
AV Area mean vel: 3.65 cm2
AV Mean grad: 2 mmHg
AV Peak grad: 4.4 mmHg
Ao pk vel: 1.05 m/s
Area-P 1/2: 3.53 cm2
Height: 64 in
S' Lateral: 3 cm
Weight: 1776 oz

## 2020-10-21 MED ORDER — MECLIZINE HCL 12.5 MG PO TABS
25.0000 mg | ORAL_TABLET | Freq: Three times a day (TID) | ORAL | Status: DC
  Administered 2020-10-21 – 2020-10-22 (×4): 25 mg via ORAL
  Filled 2020-10-21 (×4): qty 2

## 2020-10-21 NOTE — Progress Notes (Signed)
Physical Therapy Treatment Patient Details Name: Kim Blankenship MRN: [id number] DOB: [birth year] Today's Date: 10/21/2020    History of Present Illness 75 y/o female presented to ED on 7/21 with complaints of headache, dizziness, and feeling off balance. CT head negative. 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 PMH: Afib, COPD, HTN, CVA, HLD, depression    PT Comments    Patient seen for vestibular evaluation as requested by MD. No vestibular cause for her dizziness found.  See below for details. Patient reports spontaneous onset of dizziness ~6 months ago. She reports it is constant, not positional. She has a difficult time describing it, "It's a weird feeling right here (pointing across her forehead)." She reports only rarely does she feel like she is spinning (not the environment) and never sees the environment or horizon move. No nystagmus elicited during assessment, and dizziness was at her baseline. Orthostatic vital signs were WNL (see flowsheet). Patient is very cautious when she is experiencing dizziness and denies any falls in past 6 mos.    Follow Up Recommendations  No PT follow up     Equipment Recommendations  None recommended by PT    Recommendations for Other Services       Precautions / Restrictions Precautions Precautions: Fall Precaution Comments: dizziness; constant x 6 mos    Mobility  Bed Mobility Overal bed mobility: Modified Independent             General bed mobility comments: did report incr dizziness without nystagmus and returned to "normal" level in 30 seconds    Transfers Overall transfer level: Needs assistance Equipment used: None Transfers: Sit to/from Stand Sit to Stand: Supervision         General transfer comment: reports dizziness upon standing but no physical assistance to steady  Ambulation/Gait Ambulation/Gait assistance: Min guard Gait Distance (Feet): 25 Feet Assistive device: None Gait  Pattern/deviations: Decreased stride length;Step-through pattern Gait velocity: decreased   General Gait Details: min guard for safety. Pt with one step sideways to incr her BOS and steady herself (no assist needed). Denies unsteadiness usually despite dizziness "I've been flat for 3 days"   Stairs             Wheelchair Mobility    Modified Rankin (Stroke Patients Only) Modified Rankin (Stroke Patients Only) Pre-Morbid Rankin Score: No symptoms Modified Rankin: Moderately severe disability     Balance Overall balance assessment: Mild deficits observed, not formally tested                                          Cognition Arousal/Alertness: Awake/alert Behavior During Therapy: WFL for tasks assessed/performed Overall Cognitive Status: No family/caregiver present to determine baseline cognitive functioning                                 General Comments: cognition WFL conversationally      Exercises      General Comments General comments (skin integrity, edema, etc.): pt reports if her dizziness is too strong, she returns to sitting or lying down and does not risk falling; if daughter home, she will call for her help; she keeps her cellphone on her at all times in case of emergency/fall (denies any falls)      Pertinent Vitals/Pain Pain Assessment: No/denies pain Faces Pain Scale: No  hurt    Home Living                      Prior Function            PT Goals (current goals can now be found in the care plan section) Acute Rehab PT Goals Patient Stated Goal: to go home Time For Goal Achievement: 11/03/20 Potential to Achieve Goals: Good Progress towards PT goals: Progressing toward goals    Frequency    Min 4X/week      PT Plan Current plan remains appropriate    Co-evaluation              AM-PAC PT "6 Clicks" Mobility   Outcome Measure  Help needed turning from your back to your side while in a  flat bed without using bedrails?: None Help needed moving from lying on your back to sitting on the side of a flat bed without using bedrails?: None Help needed moving to and from a bed to a chair (including a wheelchair)?: A Little Help needed standing up from a chair using your arms (e.g., wheelchair or bedside chair)?: A Little Help needed to walk in hospital room?: A Little Help needed climbing 3-5 steps with a railing? : A Little 6 Click Score: 20    End of Session Equipment Utilized During Treatment: Gait belt Activity Tolerance: Patient limited by fatigue Patient left: in bed;with call bell/phone within reach;with bed alarm set Nurse Communication: Mobility status;Other (comment) (orthostatics negative) PT Visit Diagnosis: Unsteadiness on feet (R26.81);Muscle weakness (generalized) (M62.81)     Time: [id number] PT Time Calculation (min) (ACUTE ONLY): 35 min  Charges:  $Gait Training: 8-22 mins $Therapeutic Activity: 8-22 mins                      Arby Barrette, PT Pager [phone]    Rexanne Mano 10/21/2020, 9:57 AM

## 2020-10-21 NOTE — Progress Notes (Signed)
  Echocardiogram 2D Echocardiogram has been performed.  Kim Blankenship 10/21/2020, 8:37 AM

## 2020-10-21 NOTE — Progress Notes (Signed)
Patient self-removed PIV this AM. Patient refusing to allow nursing to restart PIV. Dr. Candiss Norse notified.

## 2020-10-21 NOTE — Progress Notes (Signed)
PROGRESS NOTE                                                                                                                                                                                                             Patient Demographics:    Kim Blankenship, is a 75 y.o. female, DOB - 20-Dec-1945, DP:[id number]  Outpatient Primary MD for the patient is Jolinda Croak, MD    LOS - 1  Admit date - 10/19/2020    Chief Complaint  Patient presents with   Dizziness   Nausea       Brief Narrative (HPI from H&P)  - Kim Blankenship is a 75 y.o. female with medical history significant of  COPD, CVA, chronic diastolic heart failure, paroxysmal atrial fibrillation not on anticoagulation due to history of GI bleed, hypertension, valvular intraepithelial neoplasm, history of seizures, hyperlipidemia and depression who presents with sudden onset of dizziness and feeling off balance that started this AM when she woke up, work-up suggested CVA and left upper lobe lung mass and she was admitted.   Subjective:   Patient in bed, appears comfortable, denies any headache, no fever, no chest pain or pressure, no shortness of breath , no abdominal pain. No new focal weakness.   Assessment  & Plan :      Acute right paracentral gyrus infarct along with orthostatic hypotension due to dehydration causing dizziness and some left-sided weakness - she has been seen by neurology, stroke work-up will be deferred to neurology.  Currently on dual antiplatelet therapy and statin of note she has history of paroxysmal A. fib and not on anticoagulation due to GI bleed.  Will defer further work-up to neurology.  Plavix to be stopped after 21 doses.  Since LDL was a above goal have adjusted statin dose A1c is stable.  2.  Paroxysmal A. fib [place] vas 2 score of greater than 4.  History of GI bleed hence chronically not on anticoagulation, supine blood  pressures are okay but she is orthostatic hence will apply TED stockings and hold off further blood pressure medications.  3.  Orthostatic hypotension with stable supine blood pressure.  TED stockings, stable TSH and cortisol and monitor.  4.  Dyslipidemia.  Statin dose increased.  5.  History of GI bleed.  Placed on PPI.  6.  History of  anxiety and depression.  Continue home medications no acute issues.  7.  Vertigo with nystagmus.  No ear issues, cannot be explained by CVA but she is slightly orthostatic.  Supine blood pressures are good, TSH and cortisol are stable, will place her on scheduled Antivert, PT OT and monitor.    8. Incidental finding of rapidly enlarging left upper lobe mass 13 mm in size.  Following likely outpatient biopsy.    Lab Results  Component Value Date   CHOL 161 10/20/2020   HDL 77 10/20/2020   LDLCALC 78 10/20/2020   TRIG 30 10/20/2020   CHOLHDL 2.1 10/20/2020    Lab Results  Component Value Date   HGBA1C 5.6 10/20/2020         Condition -   Guarded  Family Communication  :  called daughter [name] [phone] 10/20/20 @ 11.55 and 12.07 pm, 10/21/20 10.40 am  > 12 rings no response  Code Status :  DNR  Consults  :  Neuro, PCCM, Pall. Care  PUD Prophylaxis :  PPI   Procedures  :     MRI - 1. Punctate acute infarct in the right precentral gyrus. Slight edema without mass effect. 2. Moderate chronic microvascular ischemic disease and mild atrophy. 3. Fluid layering in bilateral maxillary sinuses  CTA Head and Neck - Negative CT perfusion study. Atherosclerotic calcification at both carotid bifurcation and ICA bulbs but no stenosis. Atherosclerotic calcification of the proximal right vertebral artery with 30% stenosis. Atherosclerotic calcification in both carotid siphon regions but without stenosis greater than 30% suspected. No intracranial large vessel occlusion or correctable proximal stenosis. Enlarging mass in the left upper lobe, now up to  13 mm in size, worrisome for lung carcinoma.   CT Chest - 1. 15 mm left upper lobe pulmonary nodule, increased in the interval. Imaging features remain concerning for neoplasm. 2. New patchy/nodular ill-defined airspace disease in the posterior left lower lobe, likely secondary to infectious/inflammatory etiology although neoplastic disease cannot be entirely excluded. 3. Multiple compression fractures in the thoracolumbar spine, similar to prior. 4. Aortic Atherosclerosis (ICD10-I70.0) and Emphysema (ICD10-J43.9)  TTE      Disposition Plan  :    Status is: Observation    Dispo: The patient is from: Home              Anticipated d/c is to: Home              Patient currently is not medically stable to d/c.   Difficult to place patient No   DVT Prophylaxis  :    heparin injection 5,000 Units Start: 10/20/20 1400 Place TED hose Start: 10/20/20 1206 SCD's Start: 10/19/20 1628    Lab Results  Component Value Date   PLT 210 10/20/2020    Diet :  Diet Order             Diet Heart Room service appropriate? Yes; Fluid consistency: Thin  Diet effective now                    Inpatient Medications  Scheduled Meds:  aspirin EC  81 mg Oral Daily   atorvastatin  40 mg Oral QHS   busPIRone  10 mg Oral BID   clopidogrel  75 mg Oral Daily   ferrous sulfate  325 mg Oral BID WC   folic acid  1 mg Oral Daily   heparin injection (subcutaneous)  5,000 Units Subcutaneous Q8H   meclizine  25 mg Oral TID  mometasone-formoterol  2 puff Inhalation BID   multivitamin with minerals  1 tablet Oral Daily   OLANZapine  2.5 mg Oral QHS   pantoprazole  40 mg Oral Daily   QUEtiapine  200 mg Oral QHS   thiamine  100 mg Oral Daily   umeclidinium bromide  1 puff Inhalation Daily   Continuous Infusions: PRN Meds:.acetaminophen **OR** [DISCONTINUED] acetaminophen (TYLENOL) oral liquid 160 mg/5 mL **OR** [DISCONTINUED] acetaminophen, albuterol, LORazepam, metoprolol tartrate,  senna-docusate  Antibiotics  :    Anti-infectives (From admission, onward)    None        Time Spent in minutes  30   Lala Lund M.D on 10/21/2020 at 10:54 AM  To page go to [email or web address]   [facility] -  Office  [phone]    See all Orders from today for further details    Objective:   Vitals:   10/20/20 1635 10/20/20 2000 10/20/20 2343 10/21/20 0736  BP: 132/63 (!) 107/47 105/61 (!) 149/76  Pulse: 83 100 97 94  Resp: '20 16 16 18  '$ Temp: 98.3 F (36.8 C) 98.7 F (37.1 C) 98.3 F (36.8 C) 98 F (36.7 C)  TempSrc:  Oral Oral   SpO2: 97% 97% 95% 98%  Weight:      Height:        Wt Readings from Last 3 Encounters:  10/19/20 50.3 kg  08/30/20 49.9 kg  08/29/20 49.9 kg     Intake/Output Summary (Last 24 hours) at 10/21/2020 1054 Last data filed at 10/20/2020 1200 Gross per 24 hour  Intake --  Output 300 ml  Net -300 ml     Physical Exam  Awake Alert, No new F.N deficits, anxious affect [place].AT,PERRAL Supple Neck,No JVD, No cervical lymphadenopathy appriciated.  Symmetrical Chest wall movement, Good air movement bilaterally, CTAB RRR,No Gallops, Rubs or new Murmurs, No Parasternal Heave +ve B.Sounds, Abd Soft, No tenderness, No organomegaly appriciated, No rebound - guarding or rigidity. No Cyanosis, Clubbing or edema, No new Rash or bruise     Data Review:    CBC Recent Labs  Lab 10/19/20 1320 10/19/20 1331 10/20/20 0538  WBC 17.4*  --  10.7*  HGB 15.1* 15.0 12.3  HCT 43.4 44.0 35.8*  PLT 292  --  210  MCV 93.3  --  92.7  MCH 32.5  --  31.9  MCHC 34.8  --  34.4  RDW 12.1  --  12.2  LYMPHSABS 1.5  --   --   MONOABS 1.1*  --   --   EOSABS 0.0  --   --   BASOSABS 0.1  --   --     Recent Labs  Lab 10/19/20 1320 10/19/20 1331 10/20/20 0538  NA 132* 131* 134*  K 4.0 4.0 3.9  CL 95* 96* 104  CO2 27  --  22  GLUCOSE 104* 102* 112*  BUN 7* 6* 6*  CREATININE 0.84 0.70 0.68  CALCIUM 9.2  --  8.5*  AST 25  --   --   ALT  20  --   --   ALKPHOS 78  --   --   BILITOT 1.4*  --   --   ALBUMIN 3.8  --   --   INR 0.9  --   --   TSH  --   --  1.574  HGBA1C  --   --  5.6    ------------------------------------------------------------------------------------------------------------------ Recent Labs    10/20/20 0538  CHOL 161  HDL 77  LDLCALC 78  TRIG 30  CHOLHDL 2.1    Lab Results  Component Value Date   HGBA1C 5.6 10/20/2020   ------------------------------------------------------------------------------------------------------------------ Recent Labs    10/20/20 0538  TSH 1.574    Cardiac Enzymes No results for input(s): CKMB, TROPONINI, MYOGLOBIN in the last 168 hours.  Invalid input(s): CK ------------------------------------------------------------------------------------------------------------------    Component Value Date/Time   BNP 42.1 10/11/2020 1642    Micro Results Recent Results (from the past 240 hour(s))  Resp Panel by RT-PCR (Flu A&B, Covid) Nasopharyngeal Swab     Status: None   Collection Time: 10/11/20  4:42 PM   Specimen: Nasopharyngeal Swab; Nasopharyngeal(NP) swabs in vial transport medium  Result Value Ref Range Status   SARS Coronavirus 2 by RT PCR NEGATIVE NEGATIVE Final    Comment: (NOTE) SARS-CoV-2 target nucleic acids are NOT DETECTED.  The SARS-CoV-2 RNA is generally detectable in upper respiratory specimens during the acute phase of infection. The lowest concentration of SARS-CoV-2 viral copies this assay can detect is 138 copies/mL. A negative result does not preclude SARS-Cov-2 infection and should not be used as the sole basis for treatment or other patient management decisions. A negative result may occur with  improper specimen collection/handling, submission of specimen other than nasopharyngeal swab, presence of viral mutation(s) within the areas targeted by this assay, and inadequate number of viral copies(<138 copies/mL). A negative result  must be combined with clinical observations, patient history, and epidemiological information. The expected result is Negative.  Fact Sheet for Patients:  EntrepreneurPulse.com.au  Fact Sheet for Healthcare Providers:  IncredibleEmployment.be  This test is no t yet approved or cleared by the [place] FDA and  has been authorized for detection and/or diagnosis of SARS-CoV-2 by FDA under an Emergency Use Authorization (EUA). This EUA will remain  in effect (meaning this test can be used) for the duration of the COVID-19 declaration under Section 564(b)(1) of the Act, 21 U.S.C.section 360bbb-3(b)(1), unless the authorization is terminated  or revoked sooner.       Influenza A by PCR NEGATIVE NEGATIVE Final   Influenza B by PCR NEGATIVE NEGATIVE Final    Comment: (NOTE) The Xpert Xpress SARS-CoV-2/FLU/RSV plus assay is intended as an aid in the diagnosis of influenza from Nasopharyngeal swab specimens and should not be used as a sole basis for treatment. Nasal washings and aspirates are unacceptable for Xpert Xpress SARS-CoV-2/FLU/RSV testing.  Fact Sheet for Patients: EntrepreneurPulse.com.au  Fact Sheet for Healthcare Providers: IncredibleEmployment.be  This test is not yet approved or cleared by the [place] FDA and has been authorized for detection and/or diagnosis of SARS-CoV-2 by FDA under an Emergency Use Authorization (EUA). This EUA will remain in effect (meaning this test can be used) for the duration of the COVID-19 declaration under Section 564(b)(1) of the Act, 21 U.S.C. section 360bbb-3(b)(1), unless the authorization is terminated or revoked.  Performed at [facility], [place] [address], [place], [place] [address]   Resp Panel by RT-PCR (Flu A&B, Covid) Nasopharyngeal Swab     Status: None   Collection Time: 10/19/20  1:20 PM   Specimen: Nasopharyngeal Swab;  Nasopharyngeal(NP) swabs in vial transport medium  Result Value Ref Range Status   SARS Coronavirus 2 by RT PCR NEGATIVE NEGATIVE Final    Comment: (NOTE) SARS-CoV-2 target nucleic acids are NOT DETECTED.  The SARS-CoV-2 RNA is generally detectable in upper respiratory specimens during the acute phase of infection. The lowest concentration of SARS-CoV-2 viral copies this assay can detect  is 138 copies/mL. A negative result does not preclude SARS-Cov-2 infection and should not be used as the sole basis for treatment or other patient management decisions. A negative result may occur with  improper specimen collection/handling, submission of specimen other than nasopharyngeal swab, presence of viral mutation(s) within the areas targeted by this assay, and inadequate number of viral copies(<138 copies/mL). A negative result must be combined with clinical observations, patient history, and epidemiological information. The expected result is Negative.  Fact Sheet for Patients:  EntrepreneurPulse.com.au  Fact Sheet for Healthcare Providers:  IncredibleEmployment.be  This test is no t yet approved or cleared by the [place] FDA and  has been authorized for detection and/or diagnosis of SARS-CoV-2 by FDA under an Emergency Use Authorization (EUA). This EUA will remain  in effect (meaning this test can be used) for the duration of the COVID-19 declaration under Section 564(b)(1) of the Act, 21 U.S.C.section 360bbb-3(b)(1), unless the authorization is terminated  or revoked sooner.       Influenza A by PCR NEGATIVE NEGATIVE Final   Influenza B by PCR NEGATIVE NEGATIVE Final    Comment: (NOTE) The Xpert Xpress SARS-CoV-2/FLU/RSV plus assay is intended as an aid in the diagnosis of influenza from Nasopharyngeal swab specimens and should not be used as a sole basis for treatment. Nasal washings and aspirates are unacceptable for Xpert Xpress  SARS-CoV-2/FLU/RSV testing.  Fact Sheet for Patients: EntrepreneurPulse.com.au  Fact Sheet for Healthcare Providers: IncredibleEmployment.be  This test is not yet approved or cleared by the [place] FDA and has been authorized for detection and/or diagnosis of SARS-CoV-2 by FDA under an Emergency Use Authorization (EUA). This EUA will remain in effect (meaning this test can be used) for the duration of the COVID-19 declaration under Section 564(b)(1) of the Act, 21 U.S.C. section 360bbb-3(b)(1), unless the authorization is terminated or revoked.  Performed at [facility], [place] [address] [place], [place] [address]   Urine Culture     Status: None (Preliminary result)   Collection Time: 10/19/20  6:18 PM   Specimen: Urine, Clean Catch  Result Value Ref Range Status   Specimen Description URINE, CLEAN CATCH  Final   Special Requests NONE  Final   Culture   Final    CULTURE REINCUBATED FOR BETTER GROWTH Performed at [facility], [place] [address], [place], [place] [address]    Report Status PENDING  Incomplete    Radiology Reports DG Chest 2 View  Result Date: 09/30/2020 CLINICAL DATA:  SHORTNESS OF BREATH. HEADACHE PAIN 5 DAYS HISTORY OF COPD. EXAM: CHEST - 2 VIEW COMPARISON:  08/29/2020 FINDINGS: Lungs are mildly hyperinflated. Heart size is normal. There is a small LEFT pleural effusion. No focal consolidations or pulmonary edema. Numerous chronic thoracic wedge compression fractures. IMPRESSION: Small LEFT effusion. Electronically Signed   By: Nolon Nations M.D.   On: 09/30/2020 10:50   MR BRAIN WO CONTRAST  Result Date: 10/19/2020 CLINICAL DATA:  Neuro deficit, acute stroke suspected. EXAM: MRI HEAD WITHOUT CONTRAST TECHNIQUE: Multiplanar, multiecho pulse sequences of the brain and surrounding structures were obtained without intravenous contrast. COMPARISON:  Same day CT.  MRI 10/30/2016. FINDINGS: Brain: Punctate acute  infarct in the right precentral gyrus (series 3, image 41). Slight edema without mass effect. No acute hemorrhage, mass lesion, midline shift, hydrocephalus, or extra-axial fluid collection. Mild atrophy with ex vacuo ventricular dilation. Moderate scattered T2/FLAIR hyperintensities within the white matter, most likely related to chronic microvascular ischemic disease. Vascular: Major  arterial flow voids are maintained at the skull base. Vasculature is further characterized on same day CTA. Skull and upper cervical spine: Normal marrow signal. Degenerative changes of the imaged upper cervical spine. Sinuses/Orbits: Fluid layering in bilateral maxillary sinuses. Unremarkable orbits. Other: No sizable mastoid effusions. IMPRESSION: 1. Punctate acute infarct in the right precentral gyrus. Slight edema without mass effect. 2. Moderate chronic microvascular ischemic disease and mild atrophy. 3. Fluid layering in bilateral maxillary sinuses. Electronically Signed   By: Margaretha Sheffield MD   On: 10/19/2020 18:01   DG Chest Port 1 View  Result Date: 10/19/2020 CLINICAL DATA:  Stroke. EXAM: PORTABLE CHEST 1 VIEW COMPARISON:  10/11/2020. FINDINGS: The cardiomediastinal contours are normal. Minor streaky left lung base atelectasis, new. Pulmonary vasculature is normal. No confluent consolidation, pleural effusion, or pneumothorax. No acute osseous abnormalities are seen. IMPRESSION: Minor streaky left lung base atelectasis. Electronically Signed   By: Keith Rake M.D.   On: 10/19/2020 15:55   DG Chest Portable 1 View  Result Date: 10/11/2020 CLINICAL DATA:  Shortness of breath EXAM: PORTABLE CHEST 1 VIEW COMPARISON:  September 30, 2020 FINDINGS: The heart size and mediastinal contours are within normal limits. Both lungs are clear. The visualized skeletal structures are unremarkable. IMPRESSION: No active disease. Electronically Signed   By: Dahlia Bailiff MD   On: 10/11/2020 17:21   CT HEAD CODE STROKE WO  CONTRAST  Result Date: 10/19/2020 CLINICAL DATA:  Code stroke. Neurological deficit. Acute stroke suspected. Vertigo. Last seen normal 1030 hours. EXAM: CT HEAD WITHOUT CONTRAST TECHNIQUE: Contiguous axial images were obtained from the base of the skull through the vertex without intravenous contrast. COMPARISON:  04/25/2020 FINDINGS: Brain: The study suffers from some motion degradation. No sign of acute infarction, mass lesion, hemorrhage, hydrocephalus or extra-axial collection. There are mild chronic small-vessel ischemic changes of the hemispheric white matter. Vascular: There is atherosclerotic calcification of the major vessels at the base of the brain. Skull: Negative Sinuses/Orbits: Fluid levels in both maxillary sinuses consistent with rhinosinusitis. Orbits negative. Other: None ASPECTS ([place] Stroke Program Early CT Score) - Ganglionic level infarction (caudate, lentiform nuclei, internal capsule, insula, M1-M3 cortex): 7 - Supraganglionic infarction (M4-M6 cortex): 3 Total score (0-10 with 10 being normal): 10 IMPRESSION: 1. No acute CT finding. Mild chronic small-vessel ischemic change of the hemispheric white matter. 2. ASPECTS is 10. 3. These results were communicated to Dr. Quinn Axe At 1:35 pm on 10/19/2020 by text page via the [facility] messaging system. Electronically Signed   By: Nelson Chimes M.D.   On: 10/19/2020 13:37   CT ANGIO HEAD NECK W WO CM W PERF (CODE STROKE)  Result Date: 10/19/2020 CLINICAL DATA:  Code stroke. Neurological deficit. Vertigo. No acute CT finding. EXAM: CT ANGIOGRAPHY HEAD AND NECK CT PERFUSION BRAIN TECHNIQUE: Multidetector CT imaging of the head and neck was performed using the standard protocol during bolus administration of intravenous contrast. Multiplanar CT image reconstructions and MIPs were obtained to evaluate the vascular anatomy. Carotid stenosis measurements (when applicable) are obtained utilizing NASCET criteria, using the distal internal carotid diameter  as the denominator. Multiphase CT imaging of the brain was performed following IV bolus contrast injection. Subsequent parametric perfusion maps were calculated using RAPID software. CONTRAST:  199m OMNIPAQUE IOHEXOL 350 MG/ML SOLN COMPARISON:  Head CT earlier same day. FINDINGS: CTA NECK FINDINGS Aortic arch: Aortic atherosclerosis and tortuosity. Branching pattern is normal without origin stenosis. Right carotid system: Common carotid artery widely patent to the bifurcation. Calcified plaque at  the ICA bulb and bifurcation but no stenosis. Cervical ICA is tortuous but widely patent. Left carotid system: Common carotid artery widely patent to the bifurcation. Calcified plaque at carotid bifurcation and ICA bulb but no stenosis. Cervical ICA is tortuous but widely patent. Vertebral arteries: Both vertebral artery origins patent. Some atherosclerotic calcification at the proximal right vertebral artery but no stenosis greater than 30%. Both vertebral arteries patent beyond that through the cervical region to the foramen magnum. Skeleton: Cervical spondylosis and facet osteoarthritis. Degenerative change at the C1-2 articulation on the right which could cause craniocervical pain syndromes. Other neck: No neck mass or lymphadenopathy. Upper chest: Emphysema and pulmonary scarring. Mass lesion in the posterior aspect of the left upper lobe measuring 7 x 13 mm in size. This has enlarged considerably since May of 2021 and is worrisome for lung carcinoma. Review of the MIP images confirms the above findings CTA HEAD FINDINGS Anterior circulation: Both internal carotid arteries are patent through the skull base and siphon regions. There is ordinary siphon atherosclerotic calcification but no stenosis greater than 30% suspected. The anterior and middle cerebral vessels are patent. No large vessel occlusion. No correctable proximal stenosis. No aneurysm or vascular malformation. Posterior circulation: Both vertebral arteries  widely patent through the foramen magnum to the basilar. No basilar stenosis. Posterior circulation branch vessels are normal. Venous sinuses: Patent and normal. Anatomic variants: None significant. Review of the MIP images confirms the above findings CT Brain Perfusion Findings: ASPECTS: 10 CBF (<30%) Volume: 60m Perfusion (Tmax>6.0s) volume: 033mMismatch Volume: 0ML Infarction Location:None IMPRESSION: Negative CT perfusion study. Atherosclerotic calcification at both carotid bifurcation and ICA bulbs but no stenosis. Atherosclerotic calcification of the proximal right vertebral artery with 30% stenosis. Atherosclerotic calcification in both carotid siphon regions but without stenosis greater than 30% suspected. No intracranial large vessel occlusion or correctable proximal stenosis. Enlarging mass in the left upper lobe, now up to 13 mm in size, worrisome for lung carcinoma. PET CT or tissue sampling suggested. Electronically Signed   By: MaNelson Chimes.D.   On: 10/19/2020 14:05   CT Super D Chest Wo Contrast  Result Date: 10/21/2020 CLINICAL DATA:  Pulmonary nodules. EXAM: CT CHEST WITHOUT CONTRAST TECHNIQUE: Multidetector CT imaging of the chest was performed using thin slice collimation for electromagnetic bronchoscopy planning purposes, without intravenous contrast. COMPARISON:  08/07/2019 FINDINGS: Cardiovascular: The heart size is normal. No substantial pericardial effusion. Moderate coronary artery calcification is evident. Mild atherosclerotic calcification is noted in the wall of the thoracic aorta. Mediastinum/Nodes: No mediastinal lymphadenopathy. No evidence for gross hilar lymphadenopathy although assessment is limited by the lack of intravenous contrast on today's study. The esophagus has normal imaging features. There is no axillary lymphadenopathy. Lungs/Pleura: Centrilobular emphsyema noted. 15 mm left upper lobe nodule on 16/4 was 6 mm previously. There is new patchy/nodular ill-defined  airspace disease in the posterior left lower lobe (image 40/4) likely secondary to infectious/inflammatory etiology although neoplastic disease cannot be entirely excluded. Small focus of probable atelectasis or scarring in the retrosternal left upper lobe (29/4) is mildly progressive in the interval. No pleural effusion. Upper Abdomen: Gallbladder is incompletely visualized but appears distended with layering sludge. Musculoskeletal: No worrisome lytic or sclerotic osseous abnormality. Multiple compression fractures are identified in the thoracolumbar spine, similar to prior and involving T5, T7, T8, T11, T12, and L1. IMPRESSION: 1. 15 mm left upper lobe pulmonary nodule, increased in the interval. Imaging features remain concerning for neoplasm. 2. New patchy/nodular ill-defined airspace disease in  the posterior left lower lobe, likely secondary to infectious/inflammatory etiology although neoplastic disease cannot be entirely excluded. 3. Multiple compression fractures in the thoracolumbar spine, similar to prior. 4. Aortic Atherosclerosis (ICD10-I70.0) and Emphysema (ICD10-J43.9). Electronically Signed   By: Misty Stanley M.D.   On: 10/21/2020 06:02
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
## 2020-10-22 DIAGNOSIS — R42 Dizziness and giddiness: Secondary | ICD-10-CM | POA: Diagnosis not present

## 2020-10-22 LAB — URINE CULTURE: Culture: 20000 — AB

## 2020-10-22 MED ORDER — FOSFOMYCIN TROMETHAMINE 3 G PO PACK
3.0000 g | PACK | Freq: Once | ORAL | Status: AC
  Administered 2020-10-22: 3 g via ORAL
  Filled 2020-10-22: qty 3

## 2020-10-22 MED ORDER — PANTOPRAZOLE SODIUM 40 MG PO TBEC: 40.0000 mg | DELAYED_RELEASE_TABLET | Freq: Every day | ORAL | 0 refills | Status: AC

## 2020-10-22 MED ORDER — ATORVASTATIN CALCIUM 40 MG PO TABS
40.0000 mg | ORAL_TABLET | Freq: Every day | ORAL | 0 refills | Status: AC
Start: 2020-10-22 — End: ?

## 2020-10-22 MED ORDER — CLOPIDOGREL BISULFATE 75 MG PO TABS: 75.0000 mg | ORAL_TABLET | Freq: Every day | ORAL | 0 refills | Status: AC

## 2020-10-22 MED ORDER — MECLIZINE HCL 25 MG PO TABS: 25.0000 mg | ORAL_TABLET | Freq: Three times a day (TID) | ORAL | 0 refills | Status: AC | PRN

## 2020-10-22 MED ORDER — ASPIRIN EC 81 MG PO TBEC: 81.0000 mg | DELAYED_RELEASE_TABLET | Freq: Every day | ORAL | 0 refills | Status: AC

## 2020-10-22 MED ORDER — QUETIAPINE FUMARATE 100 MG PO TABS: 200.0000 mg | ORAL_TABLET | Freq: Every day | ORAL | 0 refills | Status: AC

## 2020-10-22 MED ORDER — CARVEDILOL 6.25 MG PO TABS: 6.2500 mg | ORAL_TABLET | Freq: Two times a day (BID) | ORAL | 0 refills | Status: AC

## 2020-10-22 NOTE — Plan of Care (Addendum)
Patient discharging to home this morning with her daughter. DNR and MOST from returned to patient and placed in her DC packet. Paper scripts were shown to the patient and placed in her DC packet as well. Patient denies questions or concerns at this time. Waiting for transportation home.   Edit: Patient left the unit @ 1440 hrs with the NT for family member to pick-up.   Edit: Per the NT ([facility]), after transporting the patient via wheelchair to the ED entrance (which the patient insisted on), the patient (per the NT) insisted upon waiting for her ride by herself, despite multiple offers by the NT to remain with the patient until her ride arrives. Charge nurse Gaspar Bidding made aware of the patient's request.     Problem: Education: Goal: Knowledge of General Education information will improve Description: Including pain rating scale, medication(s)/side effects and non-pharmacologic comfort measures 10/22/2020 1044 by Jesse Sans, RN Outcome: Adequate for Discharge 10/22/2020 0815 by Jesse Sans, RN Outcome: Progressing   Problem: Health Behavior/Discharge Planning: Goal: Ability to manage health-related needs will improve 10/22/2020 1044 by Jesse Sans, RN Outcome: Adequate for Discharge 10/22/2020 0815 by Jesse Sans, RN Outcome: Progressing   Problem: Clinical Measurements: Goal: Ability to maintain clinical measurements within normal limits will improve 10/22/2020 1044 by Jesse Sans, RN Outcome: Adequate for Discharge 10/22/2020 0815 by Jesse Sans, RN Outcome: Progressing Goal: Will remain free from infection 10/22/2020 1044 by Jesse Sans, RN Outcome: Adequate for Discharge 10/22/2020 0815 by Jesse Sans, RN Outcome: Progressing Goal: Diagnostic test results will improve 10/22/2020 1044 by Jesse Sans, RN Outcome: Adequate for Discharge 10/22/2020 0815 by Jesse Sans, RN Outcome: Progressing Goal: Respiratory complications will improve 10/22/2020  1044 by Jesse Sans, RN Outcome: Adequate for Discharge 10/22/2020 0815 by Jesse Sans, RN Outcome: Progressing Goal: Cardiovascular complication will be avoided 10/22/2020 1044 by Jesse Sans, RN Outcome: Adequate for Discharge 10/22/2020 0815 by Jesse Sans, RN Outcome: Progressing   Problem: Activity: Goal: Risk for activity intolerance will decrease 10/22/2020 1044 by Jesse Sans, RN Outcome: Adequate for Discharge 10/22/2020 0815 by Jesse Sans, RN Outcome: Progressing   Problem: Nutrition: Goal: Adequate nutrition will be maintained 10/22/2020 1044 by Jesse Sans, RN Outcome: Adequate for Discharge 10/22/2020 0815 by Jesse Sans, RN Outcome: Progressing   Problem: Coping: Goal: Level of anxiety will decrease 10/22/2020 1044 by Jesse Sans, RN Outcome: Adequate for Discharge 10/22/2020 0815 by Jesse Sans, RN Outcome: Progressing   Problem: Elimination: Goal: Will not experience complications related to bowel motility 10/22/2020 1044 by Jesse Sans, RN Outcome: Adequate for Discharge 10/22/2020 0815 by Jesse Sans, RN Outcome: Progressing Goal: Will not experience complications related to urinary retention 10/22/2020 1044 by Jesse Sans, RN Outcome: Adequate for Discharge 10/22/2020 0815 by Jesse Sans, RN Outcome: Progressing   Problem: Pain Managment: Goal: General experience of comfort will improve 10/22/2020 1044 by Jesse Sans, RN Outcome: Adequate for Discharge 10/22/2020 0815 by Jesse Sans, RN Outcome: Progressing   Problem: Safety: Goal: Ability to remain free from injury will improve 10/22/2020 1044 by Jesse Sans, RN Outcome: Adequate for Discharge 10/22/2020 0815 by Jesse Sans, RN Outcome: Progressing   Problem: Skin Integrity: Goal: Risk for impaired skin integrity will decrease 10/22/2020 1044 by Jesse Sans, RN Outcome: Adequate for Discharge 10/22/2020 0815 by Jesse Sans,  RN Outcome: Progressing   Problem:  Ischemic Stroke/TIA Tissue Perfusion: Goal: Complications of ischemic stroke/TIA will be minimized 10/22/2020 1044 by Jesse Sans, RN Outcome: Adequate for Discharge 10/22/2020 0815 by Jesse Sans, RN Outcome: Progressing   Problem: Education: Goal: Knowledge of disease or condition will improve 10/22/2020 1044 by Jesse Sans, RN Outcome: Adequate for Discharge 10/22/2020 0815 by Jesse Sans, RN Outcome: Progressing Goal: Knowledge of secondary prevention will improve 10/22/2020 1044 by Jesse Sans, RN Outcome: Adequate for Discharge 10/22/2020 0815 by Jesse Sans, RN Outcome: Progressing Goal: Knowledge of patient specific risk factors addressed and post discharge goals established will improve 10/22/2020 1044 by Jesse Sans, RN Outcome: Adequate for Discharge 10/22/2020 0815 by Jesse Sans, RN Outcome: Progressing Goal: Individualized Educational Video(s) 10/22/2020 1044 by Jesse Sans, RN Outcome: Adequate for Discharge 10/22/2020 0815 by Jesse Sans, RN Outcome: Progressing   Problem: Coping: Goal: Will verbalize positive feelings about self 10/22/2020 1044 by Jesse Sans, RN Outcome: Adequate for Discharge 10/22/2020 0815 by Jesse Sans, RN Outcome: Progressing   Problem: Self-Care: Goal: Ability to participate in self-care as condition permits will improve 10/22/2020 1044 by Jesse Sans, RN Outcome: Adequate for Discharge 10/22/2020 0815 by Jesse Sans, RN Outcome: Progressing   Problem: Nutrition: Goal: Risk of aspiration will decrease 10/22/2020 1044 by Jesse Sans, RN Outcome: Adequate for Discharge 10/22/2020 0815 by Jesse Sans, RN Outcome: Progressing   Problem: Ischemic Stroke/TIA Tissue Perfusion: Goal: Complications of ischemic stroke/TIA will be minimized 10/22/2020 1044 by Jesse Sans, RN Outcome: Adequate for Discharge 10/22/2020 0815 by Jesse Sans,  RN Outcome: Progressing

## 2020-10-22 NOTE — Care Management (Addendum)
Spoke w patient to discuss recommendations for OP SLP and 3/1. She declined both. No other CM needs identified.  Per MD request, reached out to daughter, phone number message states not in service. Peytyn, Petrucelli Daughter [phone]  [phone]   Called [phone], and there was no answer and I could not leave a VM.

## 2020-10-22 NOTE — Discharge Summary (Signed)
Kim Blankenship [id number] DOB: 29-Apr-1945 DOA: 10/19/2020  PCP: Jolinda Croak, MD  Admit date: 10/19/2020  Discharge date: 10/22/2020  Admitted From: Home  Disposition:  Home   Recommendations for Outpatient Follow-up:   Follow up with PCP in 1-2 weeks  PCP Please obtain BMP/CBC, 2 view CXR in 1week,  (see Discharge instructions)   PCP Please follow up on the following pending results: Needs lung biopsy, monitor secondary risk factors for CVA,   Home Health: RN, PT   Equipment/Devices: Gilford Rile, 3in1  Consultations: euro, PCCM, Pall-Care Discharge Condition: Fair   CODE STATUS: DNR Diet Recommendation: Heart Healthy    Diet Order             Diet - low sodium heart healthy           Diet Heart Room service appropriate? Yes; Fluid consistency: Thin  Diet effective now                    Chief Complaint  Patient presents with   Dizziness   Nausea     Brief history of present illness from the day of admission and additional interim summary    Kim Blankenship is a 75 y.o. female with medical history significant of  COPD, CVA, chronic diastolic heart failure, paroxysmal atrial fibrillation not on anticoagulation due to history of GI bleed, hypertension, valvular intraepithelial neoplasm, history of seizures, hyperlipidemia and depression who presents with sudden onset of dizziness and feeling off balance that started this AM when she woke up, work-up suggested CVA and left upper lobe lung mass and she was admitted.                                                                 Hospital Course     Acute right paracentral gyrus infarct along with orthostatic hypotension due to dehydration causing dizziness and some left-sided weakness - she has been seen by neurology, stroke work-up will  be deferred to neurology.  Currently on dual antiplatelet therapy and statin of note she has history of paroxysmal A. fib and not on anticoagulation due to GI bleed.  Her symptoms are much improved and she will be discharged home with outpatient PCP and neurology follow-up.  Plavix to be stopped after 21 total doses. Since LDL was a above goal have adjusted statin dose A1c was stable.   2.  Paroxysmal A. fib [place] vas 2 score of greater than 4.  History of GI bleed hence chronically not on anticoagulation, currently going home on dual antiplatelet therapy along with Coreg to be started from tomorrow for rate control and supine blood pressure control.   3.  Orthostatic hypotension with stable supine blood pressure.  TED stockings, stable TSH and cortisol and monitor.  Requested her to  take TED stockings at home and wear it in daytime, PCP to monitor.  Also giving her rolling walker and 3 and 1 to minimize fall risk.   4.  Dyslipidemia.  Statin dose increased.   5.  History of GI bleed.  Placed on PPI.   6.  History of anxiety and depression.  Continue home medications no acute issues.   7.  Vertigo with nystagmus.  Does have some intermittent nystagmus, also was mildly orthostatic, placed on Antivert along with TED stockings and gentle hydration, symptoms much improved continue supportive care, of note her supine blood pressures are pretty stable.  Stable TSH and normal cortisol.  If symptoms persist outpatient ENT follow-up.   8. Incidental finding of rapidly enlarging left upper lobe mass 13 mm in size.  She has been seen by pulmonary needs outpatient follow-up and biopsy.   Discharge diagnosis     Principal Problem:   Dizziness Active Problems:   COPD (chronic obstructive pulmonary disease) (HCC)   Seizure (HCC)   PAF (paroxysmal atrial fibrillation) (HCC)   Essential hypertension   HLD (hyperlipidemia)   Depression   Unsteady gait   Mass of upper lobe of left lung   Leukocytosis    Cerebral embolism with cerebral infarction    Discharge instructions    Discharge Instructions     Ambulatory referral to Neurology   Complete by: As directed    Follow up with Dr. Leonie Man at [facility] in 4-6 weeks. Too complicated for RN to follow. Thanks.   Diet - low sodium heart healthy   Complete by: As directed    Discharge instructions   Complete by: As directed    Follow with Primary MD Jolinda Croak, MD in 7 days, please discuss your CT scan and MRI findings with your MD  Get CBC, CMP, 2 view Chest X ray -  checked next visit within 1 week by Primary MD    Activity: As tolerated with Full fall precautions use walker/cane & assistance as needed  Disposition Home   Diet: Heart Healthy    Special Instructions: If you have smoked or chewed Tobacco  in the last 2 yrs please stop smoking, stop any regular Alcohol  and or any Recreational drug use.  On your next visit with your primary care physician please Get Medicines reviewed and adjusted.  Please request your Prim.MD to go over all Hospital Tests and Procedure/Radiological results at the follow up, please get all Hospital records sent to your Prim MD by signing hospital release before you go home.  If you experience worsening of your admission symptoms, develop shortness of breath, life threatening emergency, suicidal or homicidal thoughts you must seek medical attention immediately by calling 911 or calling your MD immediately  if symptoms less severe.  You Must read complete instructions/literature along with all the possible adverse reactions/side effects for all the Medicines you take and that have been prescribed to you. Take any new Medicines after you have completely understood and accpet all the possible adverse reactions/side effects.   Increase activity slowly   Complete by: As directed    No wound care   Complete by: As directed        Discharge Medications   Allergies as of 10/22/2020       Reactions    Levofloxacin    Other reaction(s): Other Leg pain   Tape Other (See Comments)   THE PATIENT'S SKIN IS THIN AND TEARS VERY EASILY (she "picks" at it; please  use coban wrap)        Medication List     STOP taking these medications    amLODipine 2.5 MG tablet Commonly known as: NORVASC   azithromycin 250 MG tablet Commonly known as: ZITHROMAX   chlorpheniramine-HYDROcodone 10-8 MG/5ML Suer Commonly known as: Tussionex Pennkinetic ER   Combivent Respimat 20-100 MCG/ACT Aers respimat Generic drug: Ipratropium-Albuterol   doxycycline 100 MG capsule Commonly known as: VIBRAMYCIN   metoprolol tartrate 50 MG tablet Commonly known as: LOPRESSOR   mometasone-formoterol 200-5 MCG/ACT Aero Commonly known as: DULERA   predniSONE 10 MG tablet Commonly known as: DELTASONE   Spiriva HandiHaler 18 MCG inhalation capsule Generic drug: tiotropium       TAKE these medications    albuterol 108 (90 Base) MCG/ACT inhaler Commonly known as: VENTOLIN HFA Inhale 2 puffs into the lungs every 4 (four) hours as needed for wheezing or shortness of breath.   aspirin EC 81 MG tablet Take 1 tablet (81 mg total) by mouth daily.   atorvastatin 40 MG tablet Commonly known as: LIPITOR Take 1 tablet (40 mg total) by mouth at bedtime. What changed:  medication strength how much to take   busPIRone 10 MG tablet Commonly known as: BUSPAR Take 10 mg by mouth 2 (two) times daily.   calcium carbonate 500 MG chewable tablet Commonly known as: TUMS - dosed in mg elemental calcium Chew 1 tablet by mouth daily as needed for indigestion or heartburn.   carvedilol 6.25 MG tablet Commonly known as: Coreg Take 1 tablet (6.25 mg total) by mouth 2 (two) times daily with a meal. Start taking on: October 23, 2020   clopidogrel 75 MG tablet Commonly known as: PLAVIX Take 1 tablet (75 mg total) by mouth daily for 19 days.   ferrous sulfate 325 (65 FE) MG tablet Take 1 tablet (325 mg total) by mouth 2  (two) times daily with a meal.   folic acid 1 MG tablet Commonly known as: FOLVITE Take 1 tablet (1 mg total) by mouth daily.   meclizine 25 MG tablet Commonly known as: ANTIVERT Take 1 tablet (25 mg total) by mouth 3 (three) times daily as needed for dizziness.   multivitamin with minerals Tabs tablet Take 1 tablet by mouth daily.   OLANZapine 2.5 MG tablet Commonly known as: ZYPREXA Take 2.5 mg by mouth at bedtime.   pantoprazole 40 MG tablet Commonly known as: PROTONIX Take 1 tablet (40 mg total) by mouth daily.   QUEtiapine 100 MG tablet Commonly known as: SEROQUEL Take 2 tablets (200 mg total) by mouth at bedtime.   spironolactone 25 MG tablet Commonly known as: ALDACTONE Take 25 mg by mouth daily.   thiamine 100 MG tablet Take 1 tablet (100 mg total) by mouth daily.   tiZANidine 2 MG tablet Commonly known as: ZANAFLEX Take 2 mg by mouth at bedtime.               Durable Medical Equipment  (From admission, onward)           Start     Ordered   10/22/20 0943  For home use only DME Walker rolling  Once       Comments: 5 wheel  Question Answer Comment  Walker: With 5 Inch Wheels   Patient needs a walker to treat with the following condition Vertigo      10/22/20 0942   10/22/20 0940  For home use only DME 3 n 1  Once  10/22/20 [id number]             Follow-up Information     Jolinda Croak, MD. Schedule an appointment as soon as possible for a visit in 1 week(s).   Specialty: Family Medicine Why: Please review your CT and MRI report along with echocardiogram.  You also have a left lung mass that needs to be followed up closely. Contact information: [place] [address] [phone]         [facility]. Schedule an appointment as soon as possible for a visit in 1 month(s).   Contact information: [address]     [address] [place] [phone]         Garner Nash, DO. Schedule an appointment as soon as possible for a visit in 1 week(s).   Specialty: Pulmonary Disease Why: LUL mass Contact information: [place] [address] [phone]                 Major procedures and Radiology Reports - PLEASE review detailed and final reports thoroughly  -       DG Chest 2 View  Result Date: 09/30/2020 CLINICAL DATA:  SHORTNESS OF BREATH. HEADACHE PAIN 5 DAYS HISTORY OF COPD. EXAM: CHEST - 2 VIEW COMPARISON:  08/29/2020 FINDINGS: Lungs are mildly hyperinflated. Heart size is normal. There is a small LEFT pleural effusion. No focal consolidations or pulmonary edema. Numerous chronic thoracic wedge compression fractures. IMPRESSION: Small LEFT effusion. Electronically Signed   By: Nolon Nations M.D.   On: 09/30/2020 10:50   MR BRAIN WO CONTRAST  Result Date: 10/19/2020 CLINICAL DATA:  Neuro deficit, acute stroke suspected. EXAM: MRI HEAD WITHOUT CONTRAST TECHNIQUE: Multiplanar, multiecho pulse sequences of the brain and surrounding structures were obtained without intravenous contrast. COMPARISON:  Same day CT.  MRI 10/30/2016. FINDINGS: Brain: Punctate acute infarct in the right precentral gyrus (series 3, image 41). Slight edema without mass effect. No acute hemorrhage, mass lesion, midline shift, hydrocephalus, or extra-axial fluid collection. Mild atrophy with ex vacuo ventricular dilation. Moderate scattered T2/FLAIR hyperintensities within the white matter, most likely related to chronic microvascular ischemic disease. Vascular: Major arterial flow voids are maintained at the skull base. Vasculature is further characterized on same day CTA. Skull and upper cervical spine: Normal marrow signal. Degenerative changes of the imaged upper cervical spine. Sinuses/Orbits: Fluid layering in bilateral maxillary sinuses. Unremarkable orbits. Other: No sizable mastoid effusions. IMPRESSION: 1. Punctate acute infarct  in the right precentral gyrus. Slight edema without mass effect. 2. Moderate chronic microvascular ischemic disease and mild atrophy. 3. Fluid layering in bilateral maxillary sinuses. Electronically Signed   By: Margaretha Sheffield MD   On: 10/19/2020 18:01   DG Chest Port 1 View  Result Date: 10/19/2020 CLINICAL DATA:  Stroke. EXAM: PORTABLE CHEST 1 VIEW COMPARISON:  10/11/2020. FINDINGS: The cardiomediastinal contours are normal. Minor streaky left lung base atelectasis, new. Pulmonary vasculature is normal. No confluent consolidation, pleural effusion, or pneumothorax. No acute osseous abnormalities are seen. IMPRESSION: Minor streaky left lung base atelectasis. Electronically Signed   By: Keith Rake M.D.   On: 10/19/2020 15:55   DG Chest Portable 1 View  Result Date: 10/11/2020 CLINICAL DATA:  Shortness of breath EXAM: PORTABLE CHEST 1 VIEW COMPARISON:  September 30, 2020 FINDINGS: The heart size and mediastinal contours are within normal limits. Both lungs are clear. The visualized skeletal structures are unremarkable. IMPRESSION: No  active disease. Electronically Signed   By: Dahlia Bailiff MD   On: 10/11/2020 17:21   ECHOCARDIOGRAM COMPLETE  Result Date: 10/21/2020    ECHOCARDIOGRAM REPORT   Patient Name:   [facility] Date of Exam: 10/21/2020 Medical Rec #:  [id number]          Height:       64.0 in Accession #:    [id number]         Weight:       111.0 lb Date of Birth:  1945-06-23          BSA:          1.523 m Patient Age:    75 years           BP:           105/61 mmHg Patient Gender: F                  HR:           97 bpm. Exam Location:  Inpatient Procedure: 2D Echo, Cardiac Doppler and Color Doppler Indications:    Stroke  History:        Patient has prior history of Echocardiogram examinations, most                 recent 11/11/2016. COPD, Arrythmias:Atrial Fibrillation; Risk                 Factors:Hypertension and Current Smoker.  Sonographer:    Clayton Lefort RDCS (AE) Referring Phys:  [id number] [place]  1. Left ventricular ejection fraction, by estimation, is 60 to 65%. The left ventricle has normal function. The left ventricle has no regional wall motion abnormalities. There is mild concentric left ventricular hypertrophy. Left ventricular diastolic parameters are consistent with Grade I diastolic dysfunction (impaired relaxation).  2. Right ventricular systolic function is normal. The right ventricular size is normal. Tricuspid regurgitation signal is inadequate for assessing PA pressure.  3. The mitral valve is normal in structure. No evidence of mitral valve regurgitation. No evidence of mitral stenosis.  4. The aortic valve is normal in structure. Aortic valve regurgitation is not visualized. No aortic stenosis is present.  5. The inferior vena cava is normal in size with greater than 50% respiratory variability, suggesting right atrial pressure of 3 mmHg. FINDINGS  Left Ventricle: Left ventricular ejection fraction, by estimation, is 60 to 65%. The left ventricle has normal function. The left ventricle has no regional wall motion abnormalities. The left ventricular internal cavity size was normal in size. There is  mild concentric left ventricular hypertrophy. Left ventricular diastolic parameters are consistent with Grade I diastolic dysfunction (impaired relaxation). Normal left ventricular filling pressure. Right Ventricle: The right ventricular size is normal. No increase in right ventricular wall thickness. Right ventricular systolic function is normal. Tricuspid regurgitation signal is inadequate for assessing PA pressure. Left Atrium: Left atrial size was normal in size. Right Atrium: Right atrial size was normal in size. Pericardium: There is no evidence of pericardial effusion. Mitral Valve: The mitral valve is normal in structure. There is mild calcification of the mitral valve leaflet(s). No evidence of mitral valve regurgitation. No evidence of mitral valve  stenosis. Tricuspid Valve: The tricuspid valve is normal in structure. Tricuspid valve regurgitation is mild . No evidence of tricuspid stenosis. Aortic Valve: The aortic valve is normal in structure. Aortic valve regurgitation is not visualized. No aortic stenosis is present. Aortic valve mean gradient measures 2.0  mmHg. Aortic valve peak gradient measures 4.4 mmHg. Aortic valve area, by VTI measures 4.08 cm. Pulmonic Valve: The pulmonic valve was normal in structure. Pulmonic valve regurgitation is not visualized. No evidence of pulmonic stenosis. Aorta: The aortic root is normal in size and structure. Venous: The inferior vena cava is normal in size with greater than 50% respiratory variability, suggesting right atrial pressure of 3 mmHg. IAS/Shunts: No atrial level shunt detected by color flow Doppler.  LEFT VENTRICLE PLAX 2D LVIDd:         3.90 cm  Diastology LVIDs:         3.00 cm  LV e' medial:    9.32 cm/s LV PW:         1.20 cm  LV E/e' medial:  7.9 LV IVS:        1.10 cm  LV e' lateral:   12.90 cm/s LVOT diam:     2.10 cm  LV E/e' lateral: 5.7 LV SV:         71 LV SV Index:   47 LVOT Area:     3.46 cm  RIGHT VENTRICLE             IVC RV S prime:     10.40 cm/s  IVC diam: 1.60 cm TAPSE (M-mode): 1.8 cm LEFT ATRIUM           Index LA diam:      2.30 cm 1.51 cm/m LA Vol (A2C): 37.7 ml 24.75 ml/m LA Vol (A4C): 42.3 ml 27.77 ml/m  AORTIC VALVE AV Area (Vmax):    3.79 cm AV Area (Vmean):   3.65 cm AV Area (VTI):     4.08 cm AV Vmax:           105.00 cm/s AV Vmean:          69.600 cm/s AV VTI:            0.175 m AV Peak Grad:      4.4 mmHg AV Mean Grad:      2.0 mmHg LVOT Vmax:         115.00 cm/s LVOT Vmean:        73.400 cm/s LVOT VTI:          0.206 m LVOT/AV VTI ratio: 1.18  AORTA Ao Root diam: 2.80 cm Ao Asc diam:  2.60 cm MITRAL VALVE MV Area (PHT): 3.53 cm    SHUNTS MV Decel Time: 215 msec    Systemic VTI:  0.21 m MV E velocity: 73.40 cm/s  Systemic Diam: 2.10 cm MV A velocity: 94.90 cm/s MV E/A  ratio:  0.77 Fransico Him MD Electronically signed by Fransico Him MD Signature Date/Time: 10/21/2020/11:11:02 AM    Final    CT HEAD CODE STROKE WO CONTRAST  Result Date: 10/19/2020 CLINICAL DATA:  Code stroke. Neurological deficit. Acute stroke suspected. Vertigo. Last seen normal 1030 hours. EXAM: CT HEAD WITHOUT CONTRAST TECHNIQUE: Contiguous axial images were obtained from the base of the skull through the vertex without intravenous contrast. COMPARISON:  04/25/2020 FINDINGS: Brain: The study suffers from some motion degradation. No sign of acute infarction, mass lesion, hemorrhage, hydrocephalus or extra-axial collection. There are mild chronic small-vessel ischemic changes of the hemispheric white matter. Vascular: There is atherosclerotic calcification of the major vessels at the base of the brain. Skull: Negative Sinuses/Orbits: Fluid levels in both maxillary sinuses consistent with rhinosinusitis. Orbits negative. Other: None ASPECTS ([place] Stroke Program Early CT Score) - Ganglionic level infarction (caudate, lentiform nuclei,  internal capsule, insula, M1-M3 cortex): 7 - Supraganglionic infarction (M4-M6 cortex): 3 Total score (0-10 with 10 being normal): 10 IMPRESSION: 1. No acute CT finding. Mild chronic small-vessel ischemic change of the hemispheric white matter. 2. ASPECTS is 10. 3. These results were communicated to Dr. Quinn Axe At 1:35 pm on 10/19/2020 by text page via the [facility] messaging system. Electronically Signed   By: Nelson Chimes M.D.   On: 10/19/2020 13:37   CT ANGIO HEAD NECK W WO CM W PERF (CODE STROKE)  Result Date: 10/19/2020 CLINICAL DATA:  Code stroke. Neurological deficit. Vertigo. No acute CT finding. EXAM: CT ANGIOGRAPHY HEAD AND NECK CT PERFUSION BRAIN TECHNIQUE: Multidetector CT imaging of the head and neck was performed using the standard protocol during bolus administration of intravenous contrast. Multiplanar CT image reconstructions and MIPs were obtained to evaluate the  vascular anatomy. Carotid stenosis measurements (when applicable) are obtained utilizing NASCET criteria, using the distal internal carotid diameter as the denominator. Multiphase CT imaging of the brain was performed following IV bolus contrast injection. Subsequent parametric perfusion maps were calculated using RAPID software. CONTRAST:  118m OMNIPAQUE IOHEXOL 350 MG/ML SOLN COMPARISON:  Head CT earlier same day. FINDINGS: CTA NECK FINDINGS Aortic arch: Aortic atherosclerosis and tortuosity. Branching pattern is normal without origin stenosis. Right carotid system: Common carotid artery widely patent to the bifurcation. Calcified plaque at the ICA bulb and bifurcation but no stenosis. Cervical ICA is tortuous but widely patent. Left carotid system: Common carotid artery widely patent to the bifurcation. Calcified plaque at carotid bifurcation and ICA bulb but no stenosis. Cervical ICA is tortuous but widely patent. Vertebral arteries: Both vertebral artery origins patent. Some atherosclerotic calcification at the proximal right vertebral artery but no stenosis greater than 30%. Both vertebral arteries patent beyond that through the cervical region to the foramen magnum. Skeleton: Cervical spondylosis and facet osteoarthritis. Degenerative change at the C1-2 articulation on the right which could cause craniocervical pain syndromes. Other neck: No neck mass or lymphadenopathy. Upper chest: Emphysema and pulmonary scarring. Mass lesion in the posterior aspect of the left upper lobe measuring 7 x 13 mm in size. This has enlarged considerably since May of 2021 and is worrisome for lung carcinoma. Review of the MIP images confirms the above findings CTA HEAD FINDINGS Anterior circulation: Both internal carotid arteries are patent through the skull base and siphon regions. There is ordinary siphon atherosclerotic calcification but no stenosis greater than 30% suspected. The anterior and middle cerebral vessels are  patent. No large vessel occlusion. No correctable proximal stenosis. No aneurysm or vascular malformation. Posterior circulation: Both vertebral arteries widely patent through the foramen magnum to the basilar. No basilar stenosis. Posterior circulation branch vessels are normal. Venous sinuses: Patent and normal. Anatomic variants: None significant. Review of the MIP images confirms the above findings CT Brain Perfusion Findings: ASPECTS: 10 CBF (<30%) Volume: 08mPerfusion (Tmax>6.0s) volume: 18m1018mismatch Volume: 0ML Infarction Location:None IMPRESSION: Negative CT perfusion study. Atherosclerotic calcification at both carotid bifurcation and ICA bulbs but no stenosis. Atherosclerotic calcification of the proximal right vertebral artery with 30% stenosis. Atherosclerotic calcification in both carotid siphon regions but without stenosis greater than 30% suspected. No intracranial large vessel occlusion or correctable proximal stenosis. Enlarging mass in the left upper lobe, now up to 13 mm in size, worrisome for lung carcinoma. PET CT or tissue sampling suggested. Electronically Signed   By: MarNelson ChimesD.   On: 10/19/2020 14:05   CT Super D Chest Wo Contrast  Result Date: 10/21/2020 CLINICAL DATA:  Pulmonary nodules. EXAM: CT CHEST WITHOUT CONTRAST TECHNIQUE: Multidetector CT imaging of the chest was performed using thin slice collimation for electromagnetic bronchoscopy planning purposes, without intravenous contrast. COMPARISON:  08/07/2019 FINDINGS: Cardiovascular: The heart size is normal. No substantial pericardial effusion. Moderate coronary artery calcification is evident. Mild atherosclerotic calcification is noted in the wall of the thoracic aorta. Mediastinum/Nodes: No mediastinal lymphadenopathy. No evidence for gross hilar lymphadenopathy although assessment is limited by the lack of intravenous contrast on today's study. The esophagus has normal imaging features. There is no axillary  lymphadenopathy. Lungs/Pleura: Centrilobular emphsyema noted. 15 mm left upper lobe nodule on 16/4 was 6 mm previously. There is new patchy/nodular ill-defined airspace disease in the posterior left lower lobe (image 40/4) likely secondary to infectious/inflammatory etiology although neoplastic disease cannot be entirely excluded. Small focus of probable atelectasis or scarring in the retrosternal left upper lobe (29/4) is mildly progressive in the interval. No pleural effusion. Upper Abdomen: Gallbladder is incompletely visualized but appears distended with layering sludge. Musculoskeletal: No worrisome lytic or sclerotic osseous abnormality. Multiple compression fractures are identified in the thoracolumbar spine, similar to prior and involving T5, T7, T8, T11, T12, and L1. IMPRESSION: 1. 15 mm left upper lobe pulmonary nodule, increased in the interval. Imaging features remain concerning for neoplasm. 2. New patchy/nodular ill-defined airspace disease in the posterior left lower lobe, likely secondary to infectious/inflammatory etiology although neoplastic disease cannot be entirely excluded. 3. Multiple compression fractures in the thoracolumbar spine, similar to prior. 4. Aortic Atherosclerosis (ICD10-I70.0) and Emphysema (ICD10-J43.9). Electronically Signed   By: Misty Stanley M.D.   On: 10/21/2020 06:02      Today   Subjective    Kim Blankenship today has no headache,no chest abdominal pain,no new weakness tingling or numbness, feels much better wants to go home today.     Objective   Blood pressure (!) 142/71, pulse 93, temperature 98.2 F (36.8 C), temperature source Oral, resp. rate 18, height '5\' 4"'$  (1.626 m), weight 50.3 kg, SpO2 95 %.  No intake or output data in the 24 hours ending 10/22/20 0949  Exam  Awake Alert, No new F.N deficits, Normal affect [place].AT,PERRAL Supple Neck,No JVD, No cervical lymphadenopathy appriciated.  Symmetrical Chest wall movement, Good air movement  bilaterally, CTAB RRR,No Gallops,Rubs or new Murmurs, No Parasternal Heave +ve B.Sounds, Abd Soft, Non tender, No organomegaly appriciated, No rebound -guarding or rigidity. No Cyanosis, Clubbing or edema, No new Rash or bruise   Data Review   CBC w Diff:  Lab Results  Component Value Date   WBC 10.7 (H) 10/20/2020   HGB 12.3 10/20/2020   HGB 13.8 04/30/2016   HCT 35.8 (L) 10/20/2020   HCT 25.9 (L) 12/05/2018   PLT 210 10/20/2020   PLT 415 (H) 04/30/2016   LYMPHOPCT 9 10/19/2020   MONOPCT 6 10/19/2020   EOSPCT 0 10/19/2020   BASOPCT 0 10/19/2020    CMP:  Lab Results  Component Value Date   NA 134 (L) 10/20/2020   NA 139 04/30/2016   K 3.9 10/20/2020   CL 104 10/20/2020   CO2 22 10/20/2020   BUN 6 (L) 10/20/2020   BUN 9 04/30/2016   CREATININE 0.68 10/20/2020   PROT 6.6 10/19/2020   ALBUMIN 3.8 10/19/2020   BILITOT 1.4 (H) 10/19/2020   ALKPHOS 78 10/19/2020   AST 25 10/19/2020   ALT 20 10/19/2020  . Lab Results  Component Value Date   HGBA1C 5.6 10/20/2020  Lab Results  Component Value Date   CHOL 161 10/20/2020   HDL 77 10/20/2020   LDLCALC 78 10/20/2020   TRIG 30 10/20/2020   CHOLHDL 2.1 10/20/2020     Total Time in preparing paper work, data evaluation and todays exam - 66 minutes  Lala Lund M.D on 10/22/2020 at 9:49 AM  [facility]

## 2020-10-22 NOTE — Plan of Care (Signed)
Potential discharge to home today. Orthostatic BP's are WNL.   Problem: Education: Goal: Knowledge of General Education information will improve Description: Including pain rating scale, medication(s)/side effects and non-pharmacologic comfort measures Outcome: Progressing   Problem: Health Behavior/Discharge Planning: Goal: Ability to manage health-related needs will improve Outcome: Progressing   Problem: Clinical Measurements: Goal: Ability to maintain clinical measurements within normal limits will improve Outcome: Progressing Goal: Will remain free from infection Outcome: Progressing Goal: Diagnostic test results will improve Outcome: Progressing Goal: Respiratory complications will improve Outcome: Progressing Goal: Cardiovascular complication will be avoided Outcome: Progressing   Problem: Activity: Goal: Risk for activity intolerance will decrease Outcome: Progressing   Problem: Nutrition: Goal: Adequate nutrition will be maintained Outcome: Progressing   Problem: Coping: Goal: Level of anxiety will decrease Outcome: Progressing   Problem: Elimination: Goal: Will not experience complications related to bowel motility Outcome: Progressing Goal: Will not experience complications related to urinary retention Outcome: Progressing   Problem: Pain Managment: Goal: General experience of comfort will improve Outcome: Progressing   Problem: Safety: Goal: Ability to remain free from injury will improve Outcome: Progressing   Problem: Skin Integrity: Goal: Risk for impaired skin integrity will decrease Outcome: Progressing   Problem: Ischemic Stroke/TIA Tissue Perfusion: Goal: Complications of ischemic stroke/TIA will be minimized Outcome: Progressing   Problem: Education: Goal: Knowledge of disease or condition will improve Outcome: Progressing Goal: Knowledge of secondary prevention will improve Outcome: Progressing Goal: Knowledge of patient specific  risk factors addressed and post discharge goals established will improve Outcome: Progressing Goal: Individualized Educational Video(s) Outcome: Progressing   Problem: Coping: Goal: Will verbalize positive feelings about self Outcome: Progressing   Problem: Self-Care: Goal: Ability to participate in self-care as condition permits will improve Outcome: Progressing   Problem: Nutrition: Goal: Risk of aspiration will decrease Outcome: Progressing   Problem: Ischemic Stroke/TIA Tissue Perfusion: Goal: Complications of ischemic stroke/TIA will be minimized Outcome: Progressing

## 2020-10-22 NOTE — Discharge Instructions (Addendum)
Follow with Primary MD Jolinda Croak, MD in 7 days, please discuss your CT scan and MRI findings with your MD  Get CBC, CMP, 2 view Chest X ray -  checked next visit within 1 week by Primary MD    Activity: As tolerated with Full fall precautions use walker/cane & assistance as needed  Disposition Home   Diet: Heart Healthy    Special Instructions: If you have smoked or chewed Tobacco  in the last 2 yrs please stop smoking, stop any regular Alcohol  and or any Recreational drug use.  On your next visit with your primary care physician please Get Medicines reviewed and adjusted.  Please request your Prim.MD to go over all Hospital Tests and Procedure/Radiological results at the follow up, please get all Hospital records sent to your Prim MD by signing hospital release before you go home.  If you experience worsening of your admission symptoms, develop shortness of breath, life threatening emergency, suicidal or homicidal thoughts you must seek medical attention immediately by calling 911 or calling your MD immediately  if symptoms less severe.  You Must read complete instructions/literature along with all the possible adverse reactions/side effects for all the Medicines you take and that have been prescribed to you. Take any new Medicines after you have completely understood and accpet all the possible adverse reactions/side effects.

## 2020-10-29 ENCOUNTER — Emergency Department (HOSPITAL_COMMUNITY): Payer: Medicare Other

## 2020-10-29 ENCOUNTER — Encounter (HOSPITAL_COMMUNITY): Payer: Self-pay | Admitting: Emergency Medicine

## 2020-10-29 ENCOUNTER — Emergency Department (HOSPITAL_COMMUNITY)
Admission: EM | Admit: 2020-10-29 | Discharge: 2020-10-29 | Disposition: A | Discharge status: Home / Self Care | Payer: Medicare Other | Attending: Emergency Medicine | Admitting: Emergency Medicine

## 2020-10-29 DIAGNOSIS — R42 Dizziness and giddiness: Secondary | ICD-10-CM | POA: Diagnosis not present

## 2020-10-29 DIAGNOSIS — I480 Paroxysmal atrial fibrillation: Secondary | ICD-10-CM | POA: Insufficient documentation

## 2020-10-29 DIAGNOSIS — Z8541 Personal history of malignant neoplasm of cervix uteri: Secondary | ICD-10-CM | POA: Insufficient documentation

## 2020-10-29 DIAGNOSIS — U071 COVID-19: Secondary | ICD-10-CM

## 2020-10-29 DIAGNOSIS — Z7982 Long term (current) use of aspirin: Secondary | ICD-10-CM | POA: Insufficient documentation

## 2020-10-29 DIAGNOSIS — R410 Disorientation, unspecified: Secondary | ICD-10-CM | POA: Diagnosis not present

## 2020-10-29 DIAGNOSIS — Z96641 Presence of right artificial hip joint: Secondary | ICD-10-CM | POA: Diagnosis not present

## 2020-10-29 DIAGNOSIS — R4182 Altered mental status, unspecified: Secondary | ICD-10-CM | POA: Insufficient documentation

## 2020-10-29 DIAGNOSIS — Z79899 Other long term (current) drug therapy: Secondary | ICD-10-CM | POA: Diagnosis not present

## 2020-10-29 DIAGNOSIS — J441 Chronic obstructive pulmonary disease with (acute) exacerbation: Secondary | ICD-10-CM | POA: Diagnosis not present

## 2020-10-29 DIAGNOSIS — J45909 Unspecified asthma, uncomplicated: Secondary | ICD-10-CM | POA: Insufficient documentation

## 2020-10-29 DIAGNOSIS — Z87891 Personal history of nicotine dependence: Secondary | ICD-10-CM | POA: Diagnosis not present

## 2020-10-29 DIAGNOSIS — F0390 Unspecified dementia without behavioral disturbance: Secondary | ICD-10-CM | POA: Diagnosis not present

## 2020-10-29 DIAGNOSIS — I110 Hypertensive heart disease with heart failure: Secondary | ICD-10-CM | POA: Insufficient documentation

## 2020-10-29 DIAGNOSIS — Z8544 Personal history of malignant neoplasm of other female genital organs: Secondary | ICD-10-CM | POA: Insufficient documentation

## 2020-10-29 DIAGNOSIS — I5032 Chronic diastolic (congestive) heart failure: Secondary | ICD-10-CM | POA: Insufficient documentation

## 2020-10-29 DIAGNOSIS — Z7902 Long term (current) use of antithrombotics/antiplatelets: Secondary | ICD-10-CM | POA: Insufficient documentation

## 2020-10-29 LAB — RAPID URINE DRUG SCREEN, HOSP PERFORMED
Amphetamines: NOT DETECTED
Barbiturates: NOT DETECTED
Benzodiazepines: NOT DETECTED
Cocaine: NOT DETECTED
Opiates: NOT DETECTED
Tetrahydrocannabinol: NOT DETECTED

## 2020-10-29 LAB — COMPREHENSIVE METABOLIC PANEL
ALT: 19 U/L (ref 0–44)
AST: 20 U/L (ref 15–41)
Albumin: 3 g/dL — ABNORMAL LOW (ref 3.5–5.0)
Alkaline Phosphatase: 72 U/L (ref 38–126)
Anion gap: 8 (ref 5–15)
BUN: 17 mg/dL (ref 8–23)
CO2: 25 mmol/L (ref 22–32)
Calcium: 8.8 mg/dL — ABNORMAL LOW (ref 8.9–10.3)
Chloride: 96 mmol/L — ABNORMAL LOW (ref 98–111)
Creatinine, Ser: 0.94 mg/dL (ref 0.44–1.00)
GFR, Estimated: >60 mL/min (ref >60)
Glucose, Bld: 152 mg/dL — ABNORMAL HIGH (ref 70–99)
Potassium: 4.2 mmol/L (ref 3.5–5.1)
Sodium: 129 mmol/L — ABNORMAL LOW (ref 135–145)
Total Bilirubin: 0.6 mg/dL (ref 0.3–1.2)
Total Protein: 5.9 g/dL — ABNORMAL LOW (ref 6.5–8.1)

## 2020-10-29 LAB — CBG MONITORING, ED: Glucose-Capillary: 78 mg/dL (ref 70–99)

## 2020-10-29 LAB — CBC WITH DIFFERENTIAL/PLATELET
Abs Immature Granulocytes: 0.22 10*3/uL — ABNORMAL HIGH (ref 0.00–0.07)
Basophils Absolute: 0 10*3/uL (ref 0.0–0.1)
Basophils Relative: 0 %
Eosinophils Absolute: 0 10*3/uL (ref 0.0–0.5)
Eosinophils Relative: 0 %
HCT: 33.9 % — ABNORMAL LOW (ref 36.0–46.0)
Hemoglobin: 11.5 g/dL — ABNORMAL LOW (ref 12.0–15.0)
Immature Granulocytes: 2 %
Lymphocytes Relative: 12 %
Lymphs Abs: 1.9 10*3/uL (ref 0.7–4.0)
MCH: 31.8 pg (ref 26.0–34.0)
MCHC: 33.9 g/dL (ref 30.0–36.0)
MCV: 93.6 fL (ref 80.0–100.0)
Monocytes Absolute: 0.9 10*3/uL (ref 0.1–1.0)
Monocytes Relative: 6 %
Neutro Abs: 12 10*3/uL — ABNORMAL HIGH (ref 1.7–7.7)
Neutrophils Relative %: 80 %
Platelets: 401 10*3/uL — ABNORMAL HIGH (ref 150–400)
RBC: 3.62 MIL/uL — ABNORMAL LOW (ref 3.87–5.11)
RDW: 12.2 % (ref 11.5–15.5)
WBC: 15 10*3/uL — ABNORMAL HIGH (ref 4.0–10.5)
nRBC: 0 % (ref 0.0–0.2)

## 2020-10-29 LAB — ETHANOL: Alcohol, Ethyl (B): <10 mg/dL (ref <10)

## 2020-10-29 LAB — APTT: aPTT: 28 seconds (ref 24–36)

## 2020-10-29 LAB — URINALYSIS, ROUTINE W REFLEX MICROSCOPIC
Bacteria, UA: NONE SEEN
Bilirubin Urine: NEGATIVE
Glucose, UA: NEGATIVE mg/dL
Hgb urine dipstick: NEGATIVE
Ketones, ur: NEGATIVE mg/dL
Nitrite: NEGATIVE
Protein, ur: NEGATIVE mg/dL
Specific Gravity, Urine: 1.008 (ref 1.005–1.030)
pH: 6 (ref 5.0–8.0)

## 2020-10-29 LAB — I-STAT ARTERIAL BLOOD GAS, ED
Acid-Base Excess: 2 mmol/L (ref 0.0–2.0)
Bicarbonate: 27.2 mmol/L (ref 20.0–28.0)
Calcium, Ion: 1.26 mmol/L (ref 1.15–1.40)
HCT: 33 % — ABNORMAL LOW (ref 36.0–46.0)
Hemoglobin: 11.2 g/dL — ABNORMAL LOW (ref 12.0–15.0)
O2 Saturation: 95 %
Potassium: 4.3 mmol/L (ref 3.5–5.1)
Sodium: 134 mmol/L — ABNORMAL LOW (ref 135–145)
TCO2: 28 mmol/L (ref 22–32)
pCO2 arterial: 41.7 mmHg (ref 32.0–48.0)
pH, Arterial: 7.423 (ref 7.350–7.450)
pO2, Arterial: 74 mmHg — ABNORMAL LOW (ref 83.0–108.0)

## 2020-10-29 LAB — AMMONIA: Ammonia: 26 umol/L (ref 9–35)

## 2020-10-29 LAB — PROTIME-INR
INR: 0.9 (ref 0.8–1.2)
Prothrombin Time: 11.6 seconds (ref 11.4–15.2)

## 2020-10-29 LAB — RESP PANEL BY RT-PCR (FLU A&B, COVID) ARPGX2
Influenza A by PCR: NEGATIVE
Influenza B by PCR: NEGATIVE
SARS Coronavirus 2 by RT PCR: POSITIVE — AB

## 2020-10-29 LAB — LACTIC ACID, PLASMA: Lactic Acid, Venous: 1.6 mmol/L (ref 0.5–1.9)

## 2020-10-29 MED ORDER — DIPHENHYDRAMINE HCL 50 MG/ML IJ SOLN: 50.0000 mg | Freq: Once | INTRAMUSCULAR | Status: DC | PRN

## 2020-10-29 MED ORDER — SODIUM CHLORIDE 0.9 % IV SOLN
2.0000 g | Freq: Once | INTRAVENOUS | Status: AC
  Administered 2020-10-29: 2 g via INTRAVENOUS
  Filled 2020-10-29: qty 20

## 2020-10-29 MED ORDER — METHYLPREDNISOLONE SODIUM SUCC 125 MG IJ SOLR: 125.0000 mg | Freq: Once | INTRAMUSCULAR | Status: DC | PRN

## 2020-10-29 MED ORDER — AEROCHAMBER PLUS FLO-VU MEDIUM MISC: 1.0000 | Freq: Once | 0 refills | Status: AC

## 2020-10-29 MED ORDER — FAMOTIDINE IN NACL 20-0.9 MG/50ML-% IV SOLN: 20.0000 mg | Freq: Once | INTRAVENOUS | Status: DC | PRN

## 2020-10-29 MED ORDER — EPINEPHRINE 0.3 MG/0.3ML IJ SOAJ: 0.3000 mg | Freq: Once | INTRAMUSCULAR | Status: DC | PRN

## 2020-10-29 MED ORDER — IPRATROPIUM-ALBUTEROL 20-100 MCG/ACT IN AERS
1.0000 | INHALATION_SPRAY | Freq: Four times a day (QID) | RESPIRATORY_TRACT | Status: DC
  Administered 2020-10-29: 1 via RESPIRATORY_TRACT
  Filled 2020-10-29: qty 4

## 2020-10-29 MED ORDER — ALBUTEROL SULFATE HFA 108 (90 BASE) MCG/ACT IN AERS
2.0000 | INHALATION_SPRAY | RESPIRATORY_TRACT | Status: DC | PRN
Start: 2020-10-29 — End: 2020-10-29
  Administered 2020-10-29: 2 via RESPIRATORY_TRACT
  Filled 2020-10-29: qty 6.7

## 2020-10-29 MED ORDER — SODIUM CHLORIDE 0.9 % IV SOLN
1.0000 g | Freq: Once | INTRAVENOUS | Status: AC
  Administered 2020-10-29: 1 g via INTRAVENOUS
  Filled 2020-10-29: qty 10

## 2020-10-29 MED ORDER — SODIUM CHLORIDE 0.9 % IV SOLN: INTRAVENOUS | Status: DC | PRN

## 2020-10-29 MED ORDER — ALBUTEROL SULFATE HFA 108 (90 BASE) MCG/ACT IN AERS: 2.0000 | INHALATION_SPRAY | Freq: Once | RESPIRATORY_TRACT | Status: DC | PRN

## 2020-10-29 MED ORDER — BEBTELOVIMAB 175 MG/2 ML IV (EUA)
175.0000 mg | Freq: Once | INTRAMUSCULAR | Status: AC
  Administered 2020-10-29: 175 mg via INTRAVENOUS
  Filled 2020-10-29: qty 2

## 2020-10-29 NOTE — ED Provider Notes (Signed)
[facility] EMERGENCY DEPARTMENT Provider Note   CSN: [id number] Arrival date & time: 10/29/20  0146     History Chief Complaint  Patient presents with   Altered Mental Status    Kim Blankenship is a 75 y.o. female.  HPI Level 5 caveat Attempted to contact [place], daughter who is listed as first contact at [phone] and no answer or voicemail 75 year old female history of recent stroke, press, A. fib, COPD, hypertension, dementia presents today with altered mental status. Review of records from triage reveal the patient was brought in by EMS.  They report that patient's family called tonight at 46 and told paramedics that the patient refused transport, she reported had taken her Suboxone and was sleepy.  Family called back at a 100 this morning and stated that she had altered mental status.  Stroke screen reported as negative by EMS.  Seemed very sleepy and will answer questions.  She was screened and had an MSE done.  Family reported some medication changes.  At that time, she was somnolent but aroused to voice.  No [place] focal deficits were noted.  It was noted in the MSE that the patient was on several sedating medications including Suboxone, Seroquel, Zyprexa, and Zanaflex.  CT of the head, chest x-Lonzell Dorris, EKG and labs were ordered at that time. Complete metabolic panel showed a sodium of 129 and glucose of 154 and otherwise was within normal limits.  CBC showed a leukocytosis of 15,000 and hemoglobin of 11.5.  He urine drug screen was clear.  CT of the head showed no new acute abnormalities.  Alcohol level less than 10.  Urinalysis shows large leukocytes with only 11-20 white blood cells and 0-5 squamous epithelial cells Discharge summary from 725 reviewed discharge diagnosis of acute right paracentral gyrus infarct along with orthostatic hypotension due to dehydration causing dizziness with patient on dual antiplatelet therapy due to paroxysmal A. fib not on  anticoagulation due to GI bleed.  Paroxysmal A. fib and orthostatic hypotension were noted.  Carvedilol was started on discharge.  Past Medical History:  Diagnosis Date   Asthma    Atrial fibrillation ([place])    Cancer ([place])    cervical   COPD (chronic obstructive pulmonary disease) ([place])    Hypertension    Irregular heart rate    PRES (posterior reversible encephalopathy syndrome)    Stroke [facility])     Patient Active Problem List   Diagnosis Date Noted   Cerebral embolism with cerebral infarction 10/21/2020   Dizziness 10/19/2020   Unsteady gait 10/19/2020   Mass of upper lobe of left lung 10/19/2020   Leukocytosis 10/19/2020   Stroke-like symptoms    Pressure injury of skin 10/12/2020   COPD exacerbation ([place]) 10/11/2020   History of stroke [id number]   Acute metabolic encephalopathy [id number]   Acute respiratory failure with hypoxia ([place]) 07/23/2019   Acute encephalopathy 07/22/2019   Overdose opiate, accidental or unintentional, initial encounter ([place]) 07/22/2019   Vulvar cancer ([place]) 07/22/2019   Delirium    GIB (gastrointestinal bleeding) 12/04/2018   Chronic diastolic CHF (congestive heart failure) ([place]) 12/04/2018   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   Stroke (cerebrum) ([place]) 11/21/2016   Hyponatremia 11/20/2016   UTI (urinary tract infection) 11/20/2016   HLD (hyperlipidemia) 11/20/2016   GERD (gastroesophageal reflux disease) 11/20/2016   Depression 11/20/2016   Hypertensive emergency 11/04/2016   EtOH dependence ([place]) 11/04/2016   Hyperglycemia 11/04/2016   Essential hypertension  Hypertensive urgency 10/30/2016   PAF (paroxysmal atrial fibrillation) ([place]) 10/30/2016   PRES (posterior reversible encephalopathy syndrome) 10/30/2016   Seizure ([place])    Acute blood loss anemia    Closed fracture of right hip ([place]) 02/09/2016   Chest pain 11/07/2015   COPD (chronic obstructive pulmonary disease) ([place]) 08/19/2013    Abnormal EKG 08/19/2013   Fever, unspecified 08/19/2013    Past Surgical History:  Procedure Laterality Date   CESAREAN SECTION     Left Foot Reconstruction     OPEN REDUCTION INTERNAL FIXATION (ORIF) METACARPAL Right 03/10/2015   Procedure: OPEN REDUCTION INTERNAL FIXATION (ORIF) RIGHT LONG  METACARPAL FRACTURE;  Surgeon: Leanora Cover, MD;  Location: [place];  Service: Orthopedics;  Laterality: Right;   TONSILLECTOMY     TOTAL HIP ARTHROPLASTY Right 02/09/2016   Procedure: TOTAL HIP ARTHROPLASTY ANTERIOR APPROACH;  Surgeon: Frederik Pear, MD;  Location: [facility];  Service: Orthopedics;  Laterality: Right;     OB History     Gravida      Para      Term      Preterm      AB      Living  1      SAB      IAB      Ectopic      Multiple      Live Births              Family History  Problem Relation Age of Onset   Dementia Father     Social History   Tobacco Use   Smoking status: Former    Packs/day: 2.00    Years: 30.00    Pack years: 60.00    Types: Cigarettes    Quit date: 04/1995    Years since quitting: 25.5   Smokeless tobacco: Never  Vaping Use   Vaping Use: Never used  Substance Use Topics   Alcohol use: Yes   Drug use: No    Home Medications Prior to Admission medications   Medication Sig Start Date End Date Taking? Authorizing Provider  albuterol (VENTOLIN HFA) 108 (90 Base) MCG/ACT inhaler Inhale 2 puffs into the lungs every 4 (four) hours as needed for wheezing or shortness of breath. 09/30/20   Isla Pence, MD  aspirin EC 81 MG tablet Take 1 tablet (81 mg total) by mouth daily. 10/22/20   Thurnell Lose, MD  atorvastatin (LIPITOR) 40 MG tablet Take 1 tablet (40 mg total) by mouth at bedtime. 10/22/20   Thurnell Lose, MD  busPIRone (BUSPAR) 10 MG tablet Take 10 mg by mouth 2 (two) times daily. 01/29/20   [provider]  calcium carbonate (TUMS - DOSED IN MG ELEMENTAL CALCIUM) 500 MG chewable tablet Chew 1  tablet by mouth daily as needed for indigestion or heartburn.    [provider]  carvedilol (COREG) 6.25 MG tablet Take 1 tablet (6.25 mg total) by mouth 2 (two) times daily with a meal. 10/23/20 11/22/20  Thurnell Lose, MD  clopidogrel (PLAVIX) 75 MG tablet Take 1 tablet (75 mg total) by mouth daily for 19 days. 10/22/20 11/10/20  Thurnell Lose, MD  ferrous sulfate 325 (65 FE) MG tablet Take 1 tablet (325 mg total) by mouth 2 (two) times daily with a meal. 12/10/18   Hongalgi, Lenis Dickinson, MD  folic acid (FOLVITE) 1 MG tablet Take 1 tablet (1 mg total) by mouth daily. 12/11/18   Hongalgi, Lenis Dickinson, MD  meclizine Johnathan Hausen)  25 MG tablet Take 1 tablet (25 mg total) by mouth 3 (three) times daily as needed for dizziness. 10/22/20   Thurnell Lose, MD  Multiple Vitamin (MULTIVITAMIN WITH MINERALS) TABS tablet Take 1 tablet by mouth daily. 12/11/18   Hongalgi, Lenis Dickinson, MD  OLANZapine (ZYPREXA) 2.5 MG tablet Take 2.5 mg by mouth at bedtime.    [provider]  pantoprazole (PROTONIX) 40 MG tablet Take 1 tablet (40 mg total) by mouth daily. 10/22/20   Thurnell Lose, MD  QUEtiapine (SEROQUEL) 100 MG tablet Take 2 tablets (200 mg total) by mouth at bedtime. 10/22/20   Thurnell Lose, MD  spironolactone (ALDACTONE) 25 MG tablet Take 25 mg by mouth daily.    [provider]  thiamine 100 MG tablet Take 1 tablet (100 mg total) by mouth daily. 12/11/18   Hongalgi, Lenis Dickinson, MD  tiZANidine (ZANAFLEX) 2 MG tablet Take 2 mg by mouth at bedtime.    [provider]    Allergies    Levofloxacin and Tape  Review of Systems   Review of Systems  All other systems reviewed and are negative.  Physical Exam Updated Vital Signs BP (!) 157/82   Pulse 73   Temp 98.7 F (37.1 C) (Rectal)   Resp (!) 23   Ht 1.651 m ('5\' 5"'$ )   Wt 50.3 kg   SpO2 95%   BMI 18.47 kg/m   Physical Exam Vitals reviewed.  Constitutional:      Comments: Patient opens eyes to verbal and tactile  stimuli  HENT:     Head: Normocephalic and atraumatic.     Right Ear: External ear normal.     Left Ear: External ear normal.     Nose: Nose normal.     Mouth/Throat:     Pharynx: Oropharynx is clear.  Eyes:     Pupils: Pupils are equal, round, and reactive to light.  Musculoskeletal:     Cervical back: Normal range of motion.    ED Results / Procedures / Treatments   Labs (all labs ordered are listed, but only abnormal results are displayed) Labs Reviewed  CBC WITH DIFFERENTIAL/PLATELET - Abnormal; Notable for the following components:      Result Value   WBC 15.0 (*)    RBC 3.62 (*)    Hemoglobin 11.5 (*)    HCT 33.9 (*)    Platelets 401 (*)    Neutro Abs 12.0 (*)    Abs Immature Granulocytes 0.22 (*)    All other components within normal limits  COMPREHENSIVE METABOLIC PANEL - Abnormal; Notable for the following components:   Sodium 129 (*)    Chloride 96 (*)    Glucose, Bld 152 (*)    Calcium 8.8 (*)    Total Protein 5.9 (*)    Albumin 3.0 (*)    All other components within normal limits  URINALYSIS, ROUTINE W REFLEX MICROSCOPIC - Abnormal; Notable for the following components:   Leukocytes,Ua LARGE (*)    All other components within normal limits  I-STAT ARTERIAL BLOOD GAS, ED - Abnormal; Notable for the following components:   pO2, Arterial 74 (*)    Sodium 134 (*)    HCT 33.0 (*)    Hemoglobin 11.2 (*)    All other components within normal limits  RESP PANEL BY RT-PCR (FLU A&B, COVID) ARPGX2  ETHANOL  RAPID URINE DRUG SCREEN, HOSP PERFORMED  PROTIME-INR  APTT  AMMONIA  LACTIC ACID, PLASMA  LACTIC ACID, PLASMA  CBG MONITORING, ED    EKG EKG Interpretation  Date/Time:  Sunday October 29 2020 07:54:36 EDT Ventricular Rate:  74 PR Interval:  124 QRS Duration: 98 QT Interval:  368 QTC Calculation: 408 R Axis:   82 Text Interpretation: Normal sinus rhythm Normal ECG Confirmed by Pattricia Boss [phone]) on 10/29/2020 8:16:33 AM  Radiology DG Chest 2  View  Result Date: 10/29/2020 CLINICAL DATA:  75 year old female with delirium. Recent punctate right MCA territory cortical infarct. Increasing lethargy. Suspicious left upper lobe pulmonary nodule. EXAM: CHEST - 2 VIEW COMPARISON:  Chest CT 10/20/2020 and earlier. FINDINGS: Semi upright AP and lateral views of the chest. Stable lung volumes. Stable mild cardiomegaly. Mildly tortuous thoracic aorta. Other mediastinal contours are within normal limits. Suspicious left upper lobe lung nodule poorly visible radiographically. Patchy left lower lobe opacity seen recently by CT appears regressed. No pneumothorax, pulmonary edema, pleural effusion or areas of worsening ventilation. Multilevel spinal compression fractures. Stable visualized osseous structures. Negative visible bowel gas pattern. IMPRESSION: 1. Evidence of regressed left lower lobe pneumonia since the CT on 10/20/2020. 2. Radiographically occult suspicious left upper lobe lung nodule demonstrated on that exam (please see that report). 3. No new cardiopulmonary abnormality. Electronically Signed   By: Genevie Ann M.D.   On: 10/29/2020 04:19   CT Head Wo Contrast  Result Date: 10/29/2020 CLINICAL DATA:  75 year old female with delirium. Recent punctate right MCA territory cortical infarct. EXAM: CT HEAD WITHOUT CONTRAST TECHNIQUE: Contiguous axial images were obtained from the base of the skull through the vertex without intravenous contrast. COMPARISON:  Brain MRI, head CT, and CTA head and neck 10/19/2020. FINDINGS: Brain: Punctate right superior perirolandic infarct seen recently by MRI remains occult on CT. No midline shift, ventriculomegaly, mass effect, evidence of mass lesion, intracranial hemorrhage or evidence of cortically based acute infarction. Gray-white matter differentiation is stable since from 10 days ago, with mild for age heterogeneity in the cerebral white matter and right basal ganglia. Vascular: Calcified atherosclerosis at the skull  base. No suspicious intracranial vascular hyperdensity. Skull: Stable, negative. Sinuses/Orbits: Stable paranasal sinus fluid and mucosal thickening. Tympanic cavities and mastoids remain clear. Other: No acute orbit or scalp soft tissue finding. IMPRESSION: 1. Punctate right superior perirolandic infarct seen 10 days ago by MRI remains occult on CT. Stable non contrast CT appearance of the brain. No new intracranial abnormality. 2. Stable paranasal sinus disease. Electronically Signed   By: Genevie Ann M.D.   On: 10/29/2020 04:37    Procedures Procedures   Medications Ordered in ED Medications - No data to display  ED Course  I have reviewed the triage vital signs and the nursing notes.  Pertinent labs & imaging results that were available during my care of the patient were reviewed by me and considered in my medical decision making (see chart for details).    MDM Rules/Calculators/A&P                           Patient on phone with daughter.  Nurse states that number was in her purse.  Daughters number reported to be [phone].  I have attempted to call again and still no answer. Discussed with daughter [name].  She states that her mother seemed more confused from baseline yesterday.  However, currently she appears to be back to baseline.  We discussed the patient's lab work-up, her urinalysis, and her imaging studies.  Her urine is equivocal.  We will culture  the urine.  She is given a gram of Rocephin here in the ED.  They are follow-up with her primary care doctor this week.  There are financial issues.  She does request social work get in contact with her.  I will consult social work to assist family.  Patient appears stable for discharge.  This may have been some sundowning and medications.  The daughter states she is not on Suboxone.  The daughter did have Suboxone several years ago but does not believe there is any left in the house.  Therefore it is unclear exactly what the patient took  last night.  She is on Seroquel which could have contributed to her sleepiness and confusion during the night.  We have discussed return precautions and need for follow-up and her daughter voices understanding. 10:02 AM COVID test returned and positive.  Monoclonal antibodies ordered.  I have attempted to call her daughter again and she does not answer. 10:55 AM Discussed above with daughter.  She is aware of the new diagnosis of COVID and the fact that the monoclonal antibodies are being given.  She is a trying to get a ride to come get her mother now. Final Clinical Impression(s) / ED Diagnoses Final diagnoses:  COVID  Dementia without behavioral disturbance, unspecified dementia type ([place])  Confusion    Rx / DC Orders ED Discharge Orders     None        Pattricia Boss, MD 10/30/20 1347

## 2020-10-29 NOTE — Discharge Instructions (Signed)
Please continue to check her oxygen levels return if having any worsening problems. She has been treated here with monoclonal antibodies. Please recheck with her doctor this week.

## 2020-10-29 NOTE — ED Notes (Signed)
Urine culture sent down with u/a 

## 2020-10-29 NOTE — ED Provider Notes (Signed)
Emergency Medicine Provider Triage Evaluation Note  Kim Blankenship , a 76 y.o. female  was evaluated in triage.  Pt complains of AMS.  EMS evaluated her at 10PM for same, refused transport but was AAOx4 at that time.  Called out again tonight due to worsening lethargy, decreased responsiveness.  Recent admission for stroke like symptoms 7/21- 7/24 and found to have punctate paracentral gyrus infarct.. On ASA and plavix.  Is on suboxone and seroquel, did take tonight per EMS.  Family reports LKW around 4PM.  Has baseline dementia/alzheimer's.  Family reports some recent medication changes during last hospitalization.  Review of Systems  Positive: Lethargy, altered Negative: fever  Physical Exam  Ht '5\' 5"'$  (1.651 m)   Wt 50.3 kg   BMI 18.47 kg/m   Gen:   Somnolent but arouses to voice and responds appropriately Resp:  Normal effort, some junky breath sounds bilaterally MSK:   Moves extremities without difficulty, equal grips bilaterally, moving legs without apparent issue, ambulation not tested Other:    Medical Decision Making  Medically screening exam initiated at 1:43 AM.  Appropriate orders placed.  Lavone Neri was informed that the remainder of the evaluation will be completed by another provider, this initial triage assessment does not replace that evaluation, and the importance of remaining in the ED until their evaluation is complete.  Somnolent but arouses to voice.  No focal deficits noted in triage.  Code stroke not activated.  She is on several sedating medications including suboxone, seroquel, zyprexa, zanaflex.  Will obtain CT head, CXR, EKG, labs.   Larene Pickett, PA-C 10/29/20 0158    Orpah Greek, MD 10/29/20 [phone]

## 2020-10-29 NOTE — ED Notes (Signed)
Brief changed and linen changed

## 2020-10-29 NOTE — ED Notes (Signed)
Rn reviewed medications and educated patient about use of inhaler. Pt verbalized understanding and had no further questions.

## 2020-10-29 NOTE — Care Management (Addendum)
Consult to [facility] to speak to daughter about medications. Called patient, phone not in service, called daughter, phone not in service for phones listed in system. Discussed with RN, CSW numbers called [phone], [phone], all without service  1100 recalled daughters phone still no service. Attempted to text, not accepting text messages

## 2020-10-29 NOTE — ED Triage Notes (Addendum)
BIB EMS.  Called about 2200 tonight by family and pt was a & o at that time and refused transport.  Pt told paramedic at that time she had just taken her suboxone and was sleepy.  Called back out for AMS about 0100.  Stroke screen neg with EMS  Pt will answer questions when roused per EMS.

## 2020-10-30 LAB — URINE CULTURE

## 2020-11-01 ENCOUNTER — Telehealth: Payer: Self-pay | Admitting: Emergency Medicine

## 2020-11-01 NOTE — Telephone Encounter (Signed)
Pt was scheduled for an appt with Dr. Lamonte Sakai 8/4 at 2pm but that appt was cancelled by pt due to her not having any transportation.  Pt's appt was going to need to be rescheduled anyway as pt tested positive for covid 10/29/20 and needs to be seen 10 days from that positive covid test.   Attempted to call pt to see if I could reschedule her appt but unable to reach. Unable to leave a VM due to no VM kicking in.  Per RB, please reschedule pt's appt to see him at least 10 days from the positive covid diagnosis and schedule her in one of Dr. Agustina Caroli 93mn lung nodule slots.  Routing this to the front desk pool to help try to get appt rescheduled.

## 2020-11-02 ENCOUNTER — Ambulatory Visit: Payer: Medicare Other | Admitting: Emergency Medicine

## 2020-11-14 NOTE — Telephone Encounter (Signed)
Unable to LVM for pt or her daughter due to both lines being busy. Did attempt to call them twice.

## 2020-12-01 ENCOUNTER — Inpatient Hospital Stay (HOSPITAL_COMMUNITY)
Admission: EM | Admit: 2020-12-01 | Discharge: 2020-12-13 | DRG: 083 | Disposition: A | Discharge status: Skilled Nursing Facility | Payer: Medicare Other | Attending: Neurosurgery | Admitting: Neurosurgery

## 2020-12-01 ENCOUNTER — Emergency Department (HOSPITAL_COMMUNITY): Payer: Medicare Other

## 2020-12-01 ENCOUNTER — Other Ambulatory Visit: Payer: Self-pay

## 2020-12-01 ENCOUNTER — Inpatient Hospital Stay (HOSPITAL_COMMUNITY): Payer: Medicare Other

## 2020-12-01 DIAGNOSIS — F32A Depression, unspecified: Secondary | ICD-10-CM | POA: Diagnosis present

## 2020-12-01 DIAGNOSIS — G40909 Epilepsy, unspecified, not intractable, without status epilepticus: Secondary | ICD-10-CM | POA: Diagnosis present

## 2020-12-01 DIAGNOSIS — Z8673 Personal history of transient ischemic attack (TIA), and cerebral infarction without residual deficits: Secondary | ICD-10-CM | POA: Diagnosis not present

## 2020-12-01 DIAGNOSIS — W1830XA Fall on same level, unspecified, initial encounter: Secondary | ICD-10-CM | POA: Diagnosis present

## 2020-12-01 DIAGNOSIS — Z96641 Presence of right artificial hip joint: Secondary | ICD-10-CM | POA: Diagnosis present

## 2020-12-01 DIAGNOSIS — K219 Gastro-esophageal reflux disease without esophagitis: Secondary | ICD-10-CM | POA: Diagnosis present

## 2020-12-01 DIAGNOSIS — Z8589 Personal history of malignant neoplasm of other organs and systems: Secondary | ICD-10-CM

## 2020-12-01 DIAGNOSIS — I110 Hypertensive heart disease with heart failure: Secondary | ICD-10-CM | POA: Diagnosis present

## 2020-12-01 DIAGNOSIS — S066X9A Traumatic subarachnoid hemorrhage with loss of consciousness of unspecified duration, initial encounter: Secondary | ICD-10-CM | POA: Diagnosis present

## 2020-12-01 DIAGNOSIS — I5032 Chronic diastolic (congestive) heart failure: Secondary | ICD-10-CM | POA: Diagnosis present

## 2020-12-01 DIAGNOSIS — Z8541 Personal history of malignant neoplasm of cervix uteri: Secondary | ICD-10-CM

## 2020-12-01 DIAGNOSIS — F0390 Unspecified dementia without behavioral disturbance: Secondary | ICD-10-CM | POA: Diagnosis present

## 2020-12-01 DIAGNOSIS — R253 Fasciculation: Secondary | ICD-10-CM | POA: Diagnosis not present

## 2020-12-01 DIAGNOSIS — E785 Hyperlipidemia, unspecified: Secondary | ICD-10-CM | POA: Diagnosis present

## 2020-12-01 DIAGNOSIS — I480 Paroxysmal atrial fibrillation: Secondary | ICD-10-CM | POA: Diagnosis present

## 2020-12-01 DIAGNOSIS — Z91048 Other nonmedicinal substance allergy status: Secondary | ICD-10-CM

## 2020-12-01 DIAGNOSIS — Z87891 Personal history of nicotine dependence: Secondary | ICD-10-CM

## 2020-12-01 DIAGNOSIS — R296 Repeated falls: Secondary | ICD-10-CM | POA: Diagnosis present

## 2020-12-01 DIAGNOSIS — Z20822 Contact with and (suspected) exposure to covid-19: Secondary | ICD-10-CM | POA: Diagnosis present

## 2020-12-01 DIAGNOSIS — Z881 Allergy status to other antibiotic agents status: Secondary | ICD-10-CM

## 2020-12-01 DIAGNOSIS — I639 Cerebral infarction, unspecified: Secondary | ICD-10-CM | POA: Diagnosis not present

## 2020-12-01 DIAGNOSIS — S065X9A Traumatic subdural hemorrhage with loss of consciousness of unspecified duration, initial encounter: Secondary | ICD-10-CM | POA: Diagnosis present

## 2020-12-01 DIAGNOSIS — E871 Hypo-osmolality and hyponatremia: Secondary | ICD-10-CM | POA: Diagnosis present

## 2020-12-01 DIAGNOSIS — R918 Other nonspecific abnormal finding of lung field: Secondary | ICD-10-CM | POA: Diagnosis present

## 2020-12-01 DIAGNOSIS — J449 Chronic obstructive pulmonary disease, unspecified: Secondary | ICD-10-CM | POA: Diagnosis present

## 2020-12-01 DIAGNOSIS — F05 Delirium due to known physiological condition: Secondary | ICD-10-CM | POA: Diagnosis not present

## 2020-12-01 DIAGNOSIS — Z79899 Other long term (current) drug therapy: Secondary | ICD-10-CM

## 2020-12-01 DIAGNOSIS — Z7982 Long term (current) use of aspirin: Secondary | ICD-10-CM | POA: Diagnosis not present

## 2020-12-01 DIAGNOSIS — I609 Nontraumatic subarachnoid hemorrhage, unspecified: Secondary | ICD-10-CM | POA: Diagnosis present

## 2020-12-01 DIAGNOSIS — R4182 Altered mental status, unspecified: Secondary | ICD-10-CM

## 2020-12-01 DIAGNOSIS — R569 Unspecified convulsions: Secondary | ICD-10-CM

## 2020-12-01 DIAGNOSIS — Z781 Physical restraint status: Secondary | ICD-10-CM

## 2020-12-01 DIAGNOSIS — R410 Disorientation, unspecified: Secondary | ICD-10-CM | POA: Diagnosis not present

## 2020-12-01 LAB — CBC WITH DIFFERENTIAL/PLATELET
Abs Immature Granulocytes: 0.1 10*3/uL — ABNORMAL HIGH (ref 0.00–0.07)
Basophils Absolute: 0 10*3/uL (ref 0.0–0.1)
Basophils Relative: 1 %
Eosinophils Absolute: 0 10*3/uL (ref 0.0–0.5)
Eosinophils Relative: 0 %
HCT: 38.1 % (ref 36.0–46.0)
Hemoglobin: 13.2 g/dL (ref 12.0–15.0)
Immature Granulocytes: 1 %
Lymphocytes Relative: 20 %
Lymphs Abs: 1.7 10*3/uL (ref 0.7–4.0)
MCH: 31.5 pg (ref 26.0–34.0)
MCHC: 34.6 g/dL (ref 30.0–36.0)
MCV: 90.9 fL (ref 80.0–100.0)
Monocytes Absolute: 0.6 10*3/uL (ref 0.1–1.0)
Monocytes Relative: 7 %
Neutro Abs: 6 10*3/uL (ref 1.7–7.7)
Neutrophils Relative %: 71 %
Platelets: 250 10*3/uL (ref 150–400)
RBC: 4.19 MIL/uL (ref 3.87–5.11)
RDW: 13.2 % (ref 11.5–15.5)
WBC: 8.5 10*3/uL (ref 4.0–10.5)
nRBC: 0 % (ref 0.0–0.2)

## 2020-12-01 LAB — BASIC METABOLIC PANEL
Anion gap: 11 (ref 5–15)
BUN: 11 mg/dL (ref 8–23)
CO2: 21 mmol/L — ABNORMAL LOW (ref 22–32)
Calcium: 9.3 mg/dL (ref 8.9–10.3)
Chloride: 97 mmol/L — ABNORMAL LOW (ref 98–111)
Creatinine, Ser: 0.85 mg/dL (ref 0.44–1.00)
GFR, Estimated: >60 mL/min (ref >60)
Glucose, Bld: 142 mg/dL — ABNORMAL HIGH (ref 70–99)
Potassium: 4 mmol/L (ref 3.5–5.1)
Sodium: 129 mmol/L — ABNORMAL LOW (ref 135–145)

## 2020-12-01 LAB — AMMONIA: Ammonia: 16 umol/L (ref 9–35)

## 2020-12-01 LAB — ETHANOL: Alcohol, Ethyl (B): <10 mg/dL (ref <10)

## 2020-12-01 LAB — TROPONIN I (HIGH SENSITIVITY)
Troponin I (High Sensitivity): 5 ng/L (ref <18)
Troponin I (High Sensitivity): 5 ng/L (ref <18)

## 2020-12-01 LAB — HEPATIC FUNCTION PANEL
ALT: 27 U/L (ref 0–44)
AST: 33 U/L (ref 15–41)
Albumin: 4 g/dL (ref 3.5–5.0)
Alkaline Phosphatase: 64 U/L (ref 38–126)
Bilirubin, Direct: 0.2 mg/dL (ref 0.0–0.2)
Indirect Bilirubin: 1 mg/dL — ABNORMAL HIGH (ref 0.3–0.9)
Total Bilirubin: 1.2 mg/dL (ref 0.3–1.2)
Total Protein: 6.6 g/dL (ref 6.5–8.1)

## 2020-12-01 LAB — MRSA NEXT GEN BY PCR, NASAL: MRSA by PCR Next Gen: DETECTED — AB

## 2020-12-01 MED ORDER — ACETAMINOPHEN 650 MG RE SUPP: 650.0000 mg | Freq: Four times a day (QID) | RECTAL | Status: DC | PRN

## 2020-12-01 MED ORDER — POLYETHYLENE GLYCOL 3350 17 G PO PACK: 17.0000 g | PACK | Freq: Every day | ORAL | Status: DC | PRN

## 2020-12-01 MED ORDER — LEVETIRACETAM IN NACL 500 MG/100ML IV SOLN
500.0000 mg | Freq: Two times a day (BID) | INTRAVENOUS | Status: DC
  Administered 2020-12-02 – 2020-12-03 (×3): 500 mg via INTRAVENOUS
  Filled 2020-12-01 (×4): qty 100

## 2020-12-01 MED ORDER — THIAMINE HCL 100 MG PO TABS
100.0000 mg | ORAL_TABLET | Freq: Every day | ORAL | Status: DC
  Administered 2020-12-02 – 2020-12-13 (×12): 100 mg via ORAL
  Filled 2020-12-01 (×12): qty 1

## 2020-12-01 MED ORDER — FLEET ENEMA 7-19 GM/118ML RE ENEM: 1.0000 | ENEMA | Freq: Once | RECTAL | Status: DC | PRN

## 2020-12-01 MED ORDER — DEXAMETHASONE SODIUM PHOSPHATE 10 MG/ML IJ SOLN
10.0000 mg | Freq: Once | INTRAMUSCULAR | Status: AC
  Administered 2020-12-01: 10 mg via INTRAVENOUS
  Filled 2020-12-01: qty 1

## 2020-12-01 MED ORDER — HYDROCODONE-ACETAMINOPHEN 5-325 MG PO TABS
1.0000 | ORAL_TABLET | ORAL | Status: DC | PRN
Start: 2020-12-01 — End: 2020-12-11
  Administered 2020-12-02: 1 via ORAL
  Administered 2020-12-06 – 2020-12-07 (×2): 2 via ORAL
  Administered 2020-12-08 – 2020-12-11 (×8): 1 via ORAL
  Filled 2020-12-01 (×5): qty 1
  Filled 2020-12-01: qty 2
  Filled 2020-12-01 (×2): qty 1
  Filled 2020-12-01: qty 2
  Filled 2020-12-01: qty 1

## 2020-12-01 MED ORDER — OLANZAPINE 2.5 MG PO TABS
2.5000 mg | ORAL_TABLET | Freq: Every day | ORAL | Status: DC
  Administered 2020-12-02 – 2020-12-12 (×11): 2.5 mg via ORAL
  Filled 2020-12-01 (×14): qty 1

## 2020-12-01 MED ORDER — FENTANYL CITRATE PF 50 MCG/ML IJ SOSY: 12.5000 ug | PREFILLED_SYRINGE | INTRAMUSCULAR | Status: DC | PRN

## 2020-12-01 MED ORDER — CALCIUM CARBONATE ANTACID 500 MG PO CHEW: 1.0000 | CHEWABLE_TABLET | Freq: Every day | ORAL | Status: DC | PRN

## 2020-12-01 MED ORDER — ONDANSETRON HCL 4 MG/2ML IJ SOLN
4.0000 mg | Freq: Once | INTRAMUSCULAR | Status: AC
  Administered 2020-12-01: 4 mg via INTRAVENOUS
  Filled 2020-12-01: qty 2

## 2020-12-01 MED ORDER — DOCUSATE SODIUM 100 MG PO CAPS
100.0000 mg | ORAL_CAPSULE | Freq: Two times a day (BID) | ORAL | Status: DC
Start: 2020-12-01 — End: 2020-12-14
  Administered 2020-12-02 – 2020-12-13 (×23): 100 mg via ORAL
  Filled 2020-12-01 (×24): qty 1

## 2020-12-01 MED ORDER — SPIRONOLACTONE 25 MG PO TABS
25.0000 mg | ORAL_TABLET | Freq: Every day | ORAL | Status: DC
  Administered 2020-12-02 – 2020-12-13 (×11): 25 mg via ORAL
  Filled 2020-12-01 (×13): qty 1

## 2020-12-01 MED ORDER — FOLIC ACID 1 MG PO TABS: 1.0000 mg | ORAL_TABLET | Freq: Every day | ORAL | Status: DC

## 2020-12-01 MED ORDER — FOLIC ACID 1 MG PO TABS
1.0000 mg | ORAL_TABLET | Freq: Every day | ORAL | Status: DC
  Administered 2020-12-02 – 2020-12-13 (×12): 1 mg via ORAL
  Filled 2020-12-01 (×12): qty 1

## 2020-12-01 MED ORDER — ADULT MULTIVITAMIN W/MINERALS CH: 1.0000 | ORAL_TABLET | Freq: Every day | ORAL | Status: DC

## 2020-12-01 MED ORDER — ONDANSETRON HCL 4 MG PO TABS: 4.0000 mg | ORAL_TABLET | Freq: Four times a day (QID) | ORAL | Status: DC | PRN

## 2020-12-01 MED ORDER — ATORVASTATIN CALCIUM 40 MG PO TABS
40.0000 mg | ORAL_TABLET | Freq: Every day | ORAL | Status: DC
  Administered 2020-12-02 – 2020-12-12 (×11): 40 mg via ORAL
  Filled 2020-12-01 (×12): qty 1

## 2020-12-01 MED ORDER — LEVETIRACETAM IN NACL 1500 MG/100ML IV SOLN
1500.0000 mg | Freq: Once | INTRAVENOUS | Status: AC
  Administered 2020-12-01: 1500 mg via INTRAVENOUS
  Filled 2020-12-01: qty 100

## 2020-12-01 MED ORDER — ADULT MULTIVITAMIN W/MINERALS CH
1.0000 | ORAL_TABLET | Freq: Every day | ORAL | Status: DC
  Administered 2020-12-02 – 2020-12-13 (×12): 1 via ORAL
  Filled 2020-12-01 (×12): qty 1

## 2020-12-01 MED ORDER — BUSPIRONE HCL 10 MG PO TABS
10.0000 mg | ORAL_TABLET | Freq: Two times a day (BID) | ORAL | Status: DC
  Administered 2020-12-02 – 2020-12-13 (×23): 10 mg via ORAL
  Filled 2020-12-01 (×24): qty 1

## 2020-12-01 MED ORDER — QUETIAPINE FUMARATE 200 MG PO TABS
200.0000 mg | ORAL_TABLET | Freq: Every day | ORAL | Status: DC
  Administered 2020-12-02 – 2020-12-12 (×11): 200 mg via ORAL
  Filled 2020-12-01 (×12): qty 1

## 2020-12-01 MED ORDER — ALBUTEROL SULFATE HFA 108 (90 BASE) MCG/ACT IN AERS
2.0000 | INHALATION_SPRAY | RESPIRATORY_TRACT | Status: DC | PRN
  Administered 2020-12-03 – 2020-12-12 (×14): 2 via RESPIRATORY_TRACT
  Filled 2020-12-01 (×3): qty 6.7

## 2020-12-01 MED ORDER — FERROUS SULFATE 325 (65 FE) MG PO TABS
325.0000 mg | ORAL_TABLET | Freq: Two times a day (BID) | ORAL | Status: DC
  Administered 2020-12-02 – 2020-12-13 (×24): 325 mg via ORAL
  Filled 2020-12-01 (×24): qty 1

## 2020-12-01 MED ORDER — BISACODYL 10 MG RE SUPP: 10.0000 mg | Freq: Every day | RECTAL | Status: DC | PRN

## 2020-12-01 MED ORDER — LABETALOL HCL 5 MG/ML IV SOLN
10.0000 mg | INTRAVENOUS | Status: DC | PRN
  Administered 2020-12-02: 10 mg via INTRAVENOUS
  Filled 2020-12-01: qty 4

## 2020-12-01 MED ORDER — ONDANSETRON HCL 4 MG/2ML IJ SOLN: 4.0000 mg | Freq: Four times a day (QID) | INTRAMUSCULAR | Status: DC | PRN

## 2020-12-01 MED ORDER — PANTOPRAZOLE SODIUM 40 MG PO TBEC
40.0000 mg | DELAYED_RELEASE_TABLET | Freq: Every day | ORAL | Status: DC
  Administered 2020-12-02 – 2020-12-13 (×12): 40 mg via ORAL
  Filled 2020-12-01 (×12): qty 1

## 2020-12-01 MED ORDER — ACETAMINOPHEN 325 MG PO TABS
650.0000 mg | ORAL_TABLET | Freq: Four times a day (QID) | ORAL | Status: DC | PRN
  Administered 2020-12-02 – 2020-12-13 (×6): 650 mg via ORAL
  Filled 2020-12-01 (×7): qty 2

## 2020-12-01 NOTE — ED Notes (Signed)
Patient transported to CT 

## 2020-12-01 NOTE — ED Notes (Signed)
Pt returned from CT °

## 2020-12-01 NOTE — Progress Notes (Signed)
EEG complete - results pending 

## 2020-12-01 NOTE — H&P (Signed)
Providing Compassionate, Quality Care - Together     Kim Blankenship is an 75 y.o. female.   Chief Complaint: Subarachnoid hemorrhage HPI: Kim Blankenship is a 75 year old female with a history significant for atrial fibrillation, COPD, hypertension, stroke, cervical cancer, and PRES. She was found on the floor by her son, who heard a crash from the next room. She was brought in to the [facility] emergency department via EMS. She was very confused initially, but has become somewhat more alert and oriented. CT head performed in the ED revealed Acute right parafalcine subdural hematoma and scattered subarachnoid hemorrhage overlying the right cerebral hemisphere. She is not a good historian and doesn't recall the sequence of events prior to arrival to the emergency department. She denies pain, numbness, weakness, changes in vision, nausea, or vomiting. She denies chest pain or shortness of breath.  Past Medical History:  Diagnosis Date   Asthma    Atrial fibrillation ([place])    Cancer ([place])    cervical   COPD (chronic obstructive pulmonary disease) ([place])    Hypertension    Irregular heart rate    PRES (posterior reversible encephalopathy syndrome)    Stroke [facility])     Past Surgical History:  Procedure Laterality Date   CESAREAN SECTION     Left Foot Reconstruction     OPEN REDUCTION INTERNAL FIXATION (ORIF) METACARPAL Right 03/10/2015   Procedure: OPEN REDUCTION INTERNAL FIXATION (ORIF) RIGHT LONG  METACARPAL FRACTURE;  Surgeon: Leanora Cover, MD;  Location: [facility];  Service: Orthopedics;  Laterality: Right;   TONSILLECTOMY     TOTAL HIP ARTHROPLASTY Right 02/09/2016   Procedure: TOTAL HIP ARTHROPLASTY ANTERIOR APPROACH;  Surgeon: Frederik Pear, MD;  Location[facility];  Service: Orthopedics;  Laterality: Right;    Family History  Problem Relation Age of Onset   Dementia Father    Social History:  reports that she quit smoking about 25 years ago. Her smoking use included  cigarettes. She has a 60.00 pack-year smoking history. She has never used smokeless tobacco. She reports current alcohol use. She reports that she does not use drugs.  Allergies:  Allergies  Allergen Reactions   Levofloxacin     Other reaction(s): Other Leg pain   Tape Other (See Comments)    THE PATIENT'S SKIN IS THIN AND TEARS VERY EASILY (she "picks" at it; please use coban wrap)    (Not in a hospital admission)   Results for orders placed or performed during the hospital encounter of 12/01/20 (from the past 48 hour(s))  Basic metabolic panel     Status: Abnormal   Collection Time: 12/01/20 11:42 AM  Result Value Ref Range   Sodium 129 (L) 135 - 145 mmol/L   Potassium 4.0 3.5 - 5.1 mmol/L   Chloride 97 (L) 98 - 111 mmol/L   CO2 21 (L) 22 - 32 mmol/L   Glucose, Bld 142 (H) 70 - 99 mg/dL    Comment: Glucose reference range applies only to samples taken after fasting for at least 8 hours.   BUN 11 8 - 23 mg/dL   Creatinine, Ser 0.85 0.44 - 1.00 mg/dL   Calcium 9.3 8.9 - 10.3 mg/dL   GFR, Estimated >60 >60 mL/min    Comment: (NOTE) Calculated using the CKD-EPI Creatinine Equation (2021)    Anion gap 11 5 - 15    Comment: Performed at [facility] [address], [place], [place] [address]  CBC with Differential  Status: Abnormal   Collection Time: 12/01/20 11:42 AM  Result Value Ref Range   WBC 8.5 4.0 - 10.5 K/uL   RBC 4.19 3.87 - 5.11 MIL/uL   Hemoglobin 13.2 12.0 - 15.0 g/dL   HCT 38.1 36.0 - 46.0 %   MCV 90.9 80.0 - 100.0 fL   MCH 31.5 26.0 - 34.0 pg   MCHC 34.6 30.0 - 36.0 g/dL   RDW 13.2 11.5 - 15.5 %   Platelets 250 150 - 400 K/uL   nRBC 0.0 0.0 - 0.2 %   Neutrophils Relative % 71 %   Neutro Abs 6.0 1.7 - 7.7 K/uL   Lymphocytes Relative 20 %   Lymphs Abs 1.7 0.7 - 4.0 K/uL   Monocytes Relative 7 %   Monocytes Absolute 0.6 0.1 - 1.0 K/uL   Eosinophils Relative 0 %   Eosinophils Absolute 0.0 0.0 - 0.5 K/uL   Basophils Relative 1 %   Basophils  Absolute 0.0 0.0 - 0.1 K/uL   Immature Granulocytes 1 %   Abs Immature Granulocytes 0.10 (H) 0.00 - 0.07 K/uL    Comment: Performed at [facility], [address], [place], [place] [address]  Troponin I (High Sensitivity)     Status: None   Collection Time: 12/01/20 11:42 AM  Result Value Ref Range   Troponin I (High Sensitivity) 5 <18 ng/L    Comment: (NOTE) Elevated high sensitivity troponin I (hsTnI) values and significant  changes across serial measurements may suggest ACS but many other  chronic and acute conditions are known to elevate hsTnI results.  Refer to the "Links" section for chest pain algorithms and additional  guidance. Performed at [facility], [place] [address] [place], [place] [address]   Ammonia     Status: None   Collection Time: 12/01/20 11:42 AM  Result Value Ref Range   Ammonia 16 9 - 35 umol/L    Comment: Performed at [facility], [place] [address] [place], [place] [address]  Ethanol     Status: None   Collection Time: 12/01/20 11:42 AM  Result Value Ref Range   Alcohol, Ethyl (B) <10 <10 mg/dL    Comment: (NOTE) Lowest detectable limit for serum alcohol is 10 mg/dL.  For medical purposes only. Performed at [facility], [place] [address], [place], [place] [address]   Hepatic function panel     Status: Abnormal   Collection Time: 12/01/20 11:42 AM  Result Value Ref Range   Total Protein 6.6 6.5 - 8.1 g/dL   Albumin 4.0 3.5 - 5.0 g/dL   AST 33 15 - 41 U/L   ALT 27 0 - 44 U/L   Alkaline Phosphatase 64 38 - 126 U/L   Total Bilirubin 1.2 0.3 - 1.2 mg/dL   Bilirubin, Direct 0.2 0.0 - 0.2 mg/dL   Indirect Bilirubin 1.0 (H) 0.3 - 0.9 mg/dL    Comment: Performed at [facility] [address] [place],  [address]   CT HEAD WO CONTRAST (5MM)  Result Date: 12/01/2020 CLINICAL DATA:  Altered mental status EXAM: CT HEAD WITHOUT CONTRAST TECHNIQUE: Contiguous axial images were obtained from the base of the skull through the  vertex without intravenous contrast. COMPARISON:  CT head 10/29/2020, brain MRI 10/19/2020 FINDINGS: Brain: There is an acute right parafalcine subdural hematomar measuring up to 9 mm in the coronal plane. There is mild mass effect on the underlying brain parenchyma. There is scattered subarachnoid hemorrhage predominantly  in the right sylvian fissure. There is no intraventricular hemorrhage. There is a prior infarct in the right caudate head. Additional foci of hypodensity in the subcortical and periventricular white matter likely reflect sequela of chronic white matter microangiopathy. There is no mass lesion. There is no midline shift. The ventricles are not enlarged. Vascular: No hyperdense vessel or unexpected calcification. Skull: There is a right parieto-occipital scalp hematoma without underlying calvarial fracture. Sinuses/Orbits: The paranasal sinuses are clear. Bilateral lens implants are in place. The globes and orbits are otherwise unremarkable. Other: None. IMPRESSION: 1. Acute right parafalcine subdural hematoma and scattered subarachnoid hemorrhage overlying the right cerebral hemisphere. 2. No intraventricular extension or midline shift. 3. Right parieto-occipital scalp hematoma without underlying calvarial fracture. Critical Value/emergent results were called by telephone at the time of interpretation on 12/01/2020 at 12:25 pm to provider MATTHEW TRIFAN , who verbally acknowledged these results. Electronically Signed   By: Valetta Mole M.D.   On: 12/01/2020 12:35   DG Chest Portable 1 View  Result Date: 12/01/2020 CLINICAL DATA:  Syncope. EXAM: PORTABLE CHEST 1 VIEW COMPARISON:  October 29, 2020. FINDINGS: The heart size and mediastinal contours are within normal limits. Both lungs are clear. The visualized skeletal structures are unremarkable. IMPRESSION: No active disease. Electronically Signed   By: Marijo Conception M.D.   On: 12/01/2020 12:27    Review of Systems  Unable to perform ROS: Mental  status change   Blood pressure (!) 154/78, pulse 87, temperature 98.9 F (37.2 C), temperature source Oral, resp. rate 15, height '5\' 2"'$  (1.575 m), weight 50.3 kg, SpO2 94 %. Physical Exam HENT:     Head: Normocephalic and atraumatic.     Nose: Nose normal.     Mouth/Throat:     Mouth: Mucous membranes are moist.     Pharynx: Oropharynx is clear.  Eyes:     Extraocular Movements: Extraocular movements intact.     Conjunctiva/sclera: Conjunctivae normal.     Pupils: Pupils are equal, round, and reactive to light.  Cardiovascular:     Rate and Rhythm: Normal rate and regular rhythm.  Pulmonary:     Effort: Pulmonary effort is normal. No respiratory distress.  Abdominal:     General: Abdomen is flat.     Palpations: Abdomen is soft.  Musculoskeletal:     Cervical back: Normal range of motion and neck supple. No rigidity or tenderness.  Skin:    General: Skin is warm and dry.     Capillary Refill: Capillary refill takes less than 2 seconds.  Neurological:     Mental Status: She is alert. She is confused.     GCS: GCS eye subscore is 4. GCS verbal subscore is 4. GCS motor subscore is 6.     Cranial Nerves: Cranial nerves are intact.     Sensory: Sensation is intact.     Motor: Motor function is intact.     Coordination: Finger-Nose-Finger Test normal.     Comments: Oriented to self and place Rhythmic twitching of BLE No pronator drift Patient with difficulty understanding instructions for heel to shin test  Psychiatric:        Attention and Perception: She is inattentive.        Speech: Speech normal.        Behavior: Behavior is slowed.        Cognition and Memory: Cognition is impaired. She exhibits impaired recent memory.     Assessment/Plan Patient with SAH and SDH following a fall.  There is question as to whether she may have suffered a seizure. Her exam is concerning for focal seizure. She will be admitted to the ICU for observation and a repeat head CT will be performed  in the morning. She should be kept NPO overnight. She can have sips with medications. Maintain SBP less than 160.  Patricia Nettle, NP 12/01/2020, 1:33 PM

## 2020-12-01 NOTE — Procedures (Signed)
Patient Name: Kim Blankenship  MRN: [id number]  Epilepsy Attending: Lora Havens  Referring Physician/Provider: Dr Su Monks Date: 12/01/2020 Duration: 25.39 mins  Patient history: 75yo F with right SDH and BL lower extremity twitching. EEG to evaluate for seizure.   Level of alertness: Awake  AEDs during EEG study: LEV  Technical aspects: This EEG study was done with scalp electrodes positioned according to the 10-20 International system of electrode placement. Electrical activity was acquired at a sampling rate of '500Hz'$  and reviewed with a high frequency filter of '70Hz'$  and a low frequency filter of '1Hz'$ . EEG data were recorded continuously and digitally stored.   Description: The posterior dominant rhythm consists of 9 Hz activity of moderate voltage (25-35 uV) seen predominantly in posterior head regions, symmetric and reactive to eye opening and eye closing.  EEG showed intermittent generalized, maximal bifrontal 3 to 5 Hz theta-delta slowing.  Sharp transients were noted in right frontal region. Patient was noted to have bilateral lower extremity stiffening/jerking intermittently throughout the study. Concomitant EEG before, during and after the event did not show any EEG to suggest seizure. Hyperventilation and photic stimulation were not performed.     ABNORMALITY -Intermittent slow, generalized and maximal bifrontal  IMPRESSION: This study is suggestive of nonspecific cortical dysfunction in bifrontal region as well as mild diffuse encephalopathy, nonspecific etiology.  Patient was noted to have bilateral lower extremity stiffening/jerking intermittently throughout the study without concomitant EEG change.  These episodes were most likely not epileptic.  No seizures or definite epileptiform discharges were seen throughout the recording.  If suspicion for ictal-interictal activity remains a concern, a prolonged study can be considered.   Lillith Mcneff Barbra Sarks

## 2020-12-01 NOTE — ED Notes (Signed)
EEG at the bedside  ?

## 2020-12-01 NOTE — Consult Note (Signed)
NEUROLOGY CONSULTATION NOTE   Date of service: December 01, 2020 Patient Name: Kim Blankenship MRN:  [id number] DOB:  1945/06/06 Reason for consult: BLE twitching Requesting physician: Dr. Langston Masker _ _ _   _ __   _ __ _ _  __ __   _ __   __ _  History of Present Illness   This is a 75 year old woman with a history significant for atrial fibrillation, COPD, hypertension, stroke, cervical cancer, and PRES.  She was found on the floor by her son who heard a crash from the next room.  She was brought in by EMS to the [facility] emergency department at which point she was found to have an acute right parafalcine subdural hematoma and scattered subarachnoid hemorrhage overlying the right hemisphere.  When I asked her if she had a fall she says no.  She is not able to tell me the events that led to her arrival in the emergency department.  She is oriented to self and hospital but does not know what year it is.  She denies focal neurologic deficits but does admit she is slightly confused.  She is admitted to ICU under NSU. Neurology is consulted due to frequent synchronous jerks of her bilateral lower extremities. These were observed at bedside by myself and separately by NSU. Patient states it is new and she doesn't know why it's happening.   ROS   Per HPI; all other systems reviewed and are negative  Past History   Past Medical History:  Diagnosis Date  . Asthma   . Atrial fibrillation ([place])   . Cancer ([place])    cervical  . COPD (chronic obstructive pulmonary disease) ([place])   . Hypertension   . Irregular heart rate   . PRES (posterior reversible encephalopathy syndrome)   . Stroke [facility])    Past Surgical History:  Procedure Laterality Date  . CESAREAN SECTION    . Left Foot Reconstruction    . OPEN REDUCTION INTERNAL FIXATION (ORIF) METACARPAL Right 03/10/2015   Procedure: OPEN REDUCTION INTERNAL FIXATION (ORIF) RIGHT LONG  METACARPAL FRACTURE;  Surgeon: Leanora Cover, MD;  Location: [facility];  Service: Orthopedics;  Laterality: Right;  . TONSILLECTOMY    . TOTAL HIP ARTHROPLASTY Right 02/09/2016   Procedure: TOTAL HIP ARTHROPLASTY ANTERIOR APPROACH;  Surgeon: Frederik Pear, MD;  Location[facility];  Service: Orthopedics;  Laterality: Right;   Family History  Problem Relation Age of Onset  . Dementia Father    Social History   Socioeconomic History  . Marital status: Divorced    Spouse name: Not on file  . Number of children: 1  . Years of education: Not on file  . Highest education level: Not on file  Occupational History  . Not on file  Tobacco Use  . Smoking status: Former    Packs/day: 2.00    Years: 30.00    Pack years: 60.00    Types: Cigarettes    Quit date: 04/1995    Years since quitting: 25.6  . Smokeless tobacco: Never  Vaping Use  . Vaping Use: Never used  Substance and Sexual Activity  . Alcohol use: Yes  . Drug use: No  . Sexual activity: Not on file  Other Topics Concern  . Not on file  Social History Narrative   Lives with daughter and her son   Caffeine use: 1 cup coffee every morning   Social Determinants of Health   Financial Resource Strain: Not on file  Food Insecurity: Not on file  Transportation Needs: Not on file  Physical Activity: Not on file  Stress: Not on file  Social Connections: Not on file   Allergies  Allergen Reactions  . Levofloxacin     Other reaction(s): Other Leg pain  . Tape Other (See Comments)    THE PATIENT'S SKIN IS THIN AND TEARS VERY EASILY (she "picks" at it; please use coban wrap)    Medications   No current facility-administered medications for this encounter.  Current Outpatient Medications:  .  albuterol (VENTOLIN HFA) 108 (90 Base) MCG/ACT inhaler, Inhale 2 puffs into the lungs every 4 (four) hours as needed for wheezing or shortness of breath., Disp: 18 g, Rfl: 0 .  aspirin EC 81 MG tablet, Take 1 tablet (81 mg total) by mouth daily., Disp: 30 tablet, Rfl: 0 .  atorvastatin  (LIPITOR) 40 MG tablet, Take 1 tablet (40 mg total) by mouth at bedtime., Disp: 30 tablet, Rfl: 0 .  busPIRone (BUSPAR) 10 MG tablet, Take 10 mg by mouth 2 (two) times daily., Disp: , Rfl:  .  calcium carbonate (TUMS - DOSED IN MG ELEMENTAL CALCIUM) 500 MG chewable tablet, Chew 1 tablet by mouth daily as needed for indigestion or heartburn., Disp: , Rfl:  .  carvedilol (COREG) 6.25 MG tablet, Take 1 tablet (6.25 mg total) by mouth 2 (two) times daily with a meal., Disp: 60 tablet, Rfl: 0 .  ferrous sulfate 325 (65 FE) MG tablet, Take 1 tablet (325 mg total) by mouth 2 (two) times daily with a meal., Disp: 60 tablet, Rfl: 0 .  folic acid (FOLVITE) 1 MG tablet, Take 1 tablet (1 mg total) by mouth daily., Disp: 30 tablet, Rfl: 0 .  meclizine (ANTIVERT) 25 MG tablet, Take 1 tablet (25 mg total) by mouth 3 (three) times daily as needed for dizziness., Disp: 30 tablet, Rfl: 0 .  Multiple Vitamin (MULTIVITAMIN WITH MINERALS) TABS tablet, Take 1 tablet by mouth daily., Disp:  , Rfl:  .  OLANZapine (ZYPREXA) 2.5 MG tablet, Take 2.5 mg by mouth at bedtime., Disp: , Rfl:  .  pantoprazole (PROTONIX) 40 MG tablet, Take 1 tablet (40 mg total) by mouth daily., Disp: 30 tablet, Rfl: 0 .  QUEtiapine (SEROQUEL) 100 MG tablet, Take 2 tablets (200 mg total) by mouth at bedtime., Disp: 30 tablet, Rfl: 0 .  spironolactone (ALDACTONE) 25 MG tablet, Take 25 mg by mouth daily., Disp: , Rfl:  .  thiamine 100 MG tablet, Take 1 tablet (100 mg total) by mouth daily., Disp: 30 tablet, Rfl: 0 .  tiZANidine (ZANAFLEX) 2 MG tablet, Take 2 mg by mouth at bedtime., Disp: , Rfl:       Vitals   Vitals:   12/01/20 1315 12/01/20 1330 12/01/20 1400 12/01/20 1430  BP: (!) 154/78 (!) 154/78 (!) 159/82 (!) 170/87  Pulse: 87 85 95 97  Resp: '15 17 18 17  '$ Temp:      TempSrc:      SpO2: 94% 92% 93% 94%  Weight:      Height:         Body mass index is 20.28 kg/m.  Physical Exam   Physical Exam Gen: alert, oriented to self and  hospital Resp: CTAB CV: RRR  Neuro: *MS: alert, oriented to self and hospital *Speech: fluid, nondysarthric, able to name and repeat *CN:    I: Deferred   II,III: PERRLA, VFF by confrontation   III,IV,VI: EOMI w/o nystagmus, no ptosis  V: Sensation intact from V1 to V3 to LT   VII: Eyelid closure was full.  Smile symmetric.   VIII: Hearing intact to voice   IX,X: Voice normal, palate elevates symmetrically    XI: SCM/trap 5/5 bilat   XII: Tongue protrudes midline, no atrophy or fasciculations   *Motor:   Normal bulk.  No tremor, rigidity or bradykinesia. No pronator drift. 5/5 strength in all extremities *Sensory: SILT *Coordination:  UTA 2/2 confusion *Reflexes:  2+ and symmetric throughout without clonus; toes down-going bilat *Gait: deferred   Labs   CBC:  Recent Labs  Lab 12/01/20 1142  WBC 8.5  NEUTROABS 6.0  HGB 13.2  HCT 38.1  MCV 90.9  PLT [id number]    Basic Metabolic Panel:  Lab Results  Component Value Date   NA 129 (L) 12/01/2020   K 4.0 12/01/2020   CO2 21 (L) 12/01/2020   GLUCOSE 142 (H) 12/01/2020   BUN 11 12/01/2020   CREATININE 0.85 12/01/2020   CALCIUM 9.3 12/01/2020   GFRNONAA >60 12/01/2020   GFRAA >60 08/06/2019   Lipid Panel:  Lab Results  Component Value Date   LDLCALC 78 10/20/2020   HgbA1c:  Lab Results  Component Value Date   HGBA1C 5.6 10/20/2020   Urine Drug Screen:     Component Value Date/Time   LABOPIA NONE DETECTED 10/29/2020 0532   COCAINSCRNUR NONE DETECTED 10/29/2020 0532   LABBENZ NONE DETECTED 10/29/2020 0532   AMPHETMU NONE DETECTED 10/29/2020 0532   THCU NONE DETECTED 10/29/2020 0532   LABBARB NONE DETECTED 10/29/2020 0532    Alcohol Level     Component Value Date/Time   ETH <10 12/01/2020 1142    CT head wo contrast 1. Acute right parafalcine subdural hematoma and scattered subarachnoid hemorrhage overlying the right cerebral hemisphere. 2. No intraventricular extension or midline shift. 3. Right  parieto-occipital scalp hematoma without underlying calvarial fracture.  Impression   75 year old woman with a history significant for atrial fibrillation, COPD, hypertension, stroke, cervical cancer, and PRES who presents with posttraumatic SDH + SAH. The witnessed BLE jerking movements are not c/w focal seizure given the bilateral symmetric distribution. STAT EEG was recorded while she was having the movements to rule out cortical myoclonus. The jerks were witnessed intermittently throughout the recording and were not associated with any epielptiform correlate on EEG. Given the clinical appearance which is somewhat variable (sometimes a jerk, sometimes more wiggly) suspicion for epileptiform etiology is low. EEG was discontinued.  Recommendations   - EEG d/c'd - No indication for continuation for AED from neuro standpoint. If NSU would like to continue keppra for prophylaxis for seizure or cortical myoclonus the dose would be '500mg'$  bid. - If abnormal movements continue after patient's confusion improves, or if she develops new spell type, recommend repeat spot EEG - Further mgmt of traumatioc SDH + SAH per admitting NSU  Neurology will not continue to actively follow, but please re-engage if additional neurologic concerns arise. ______________________________________________________________________   Thank you for the opportunity to take part in the care of this patient. If you have any further questions, please contact the neurology consultation attending.  Signed,  Su Monks, MD [facility] [phone]  If 7pm- 7am, please page neurology on call as listed in [place].

## 2020-12-01 NOTE — ED Triage Notes (Signed)
Pt arrived via [facility] from home. Per EMS the sone was in the next room and heard a crash and found pt on floor. Pt LKW 2330 yesterday. Per EMS, pt was confused on arrival, but started becoming more A&O as they got closer to hospital. Per EMS pt's lungs were diminished, they gave albuterol '5mg'$ . Pt arrived in c-collar. Dr Langston Masker removed c-collar at bedside.

## 2020-12-01 NOTE — ED Provider Notes (Signed)
[facility] EMERGENCY DEPARTMENT Provider Note   CSN: [id number] Arrival date & time: 12/01/20  1124     History Chief Complaint  Patient presents with   Altered Mental Status    Kim Blankenship is a 75 y.o. female with history of right acute embolic stroke in July [id number], COPD, presented emergency department after being found confused in the house.  EMS reports the patient had a family member in the next room and heard a crash and then found the patient on the floor this morning.  The patient last been noted to be normal and well yesterday evening around 11 pm.  She appeared acutely confused this morning.  EMS reports she was initially nonverbal, but gradually appeared to have improving mental status as they arrived in the hospital.  They gave her a dose of albuterol thinking that she sounded she was wheezing.  On exam the patient appears confused but can answer simple questions.  She cannot recall the names of her family members and does not know the year, but she can tell me her name.  She tells me she has a headache and is nauseous.  HPI     Past Medical History:  Diagnosis Date   Asthma    Atrial fibrillation ([place])    Cancer ([place])    cervical   COPD (chronic obstructive pulmonary disease) ([place])    Hypertension    Irregular heart rate    PRES (posterior reversible encephalopathy syndrome)    Stroke [facility])     Patient Active Problem List   Diagnosis Date Noted   Subarachnoid hemorrhage ([place]) 12/01/2020   Cerebral embolism with cerebral infarction 10/21/2020   Dizziness 10/19/2020   Unsteady gait 10/19/2020   Mass of upper lobe of left lung 10/19/2020   Leukocytosis 10/19/2020   Stroke-like symptoms    Pressure injury of skin 10/12/2020   COPD exacerbation ([place]) 10/11/2020   History of stroke [id number]   Acute metabolic encephalopathy [id number]   Acute respiratory failure with hypoxia (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   Stroke (cerebrum) ([place]) 11/21/2016   Hyponatremia 11/20/2016   UTI (urinary tract infection) 11/20/2016   HLD (hyperlipidemia) 11/20/2016   GERD (gastroesophageal reflux disease) 11/20/2016   Depression 11/20/2016   Hypertensive emergency 11/04/2016   EtOH dependence ([place]) 11/04/2016   Hyperglycemia 11/04/2016   Essential hypertension    Hypertensive urgency 10/30/2016   PAF (paroxysmal atrial fibrillation) ([place]) 10/30/2016   PRES (posterior reversible encephalopathy syndrome) 10/30/2016   Seizure ([place])    Acute blood loss anemia    Closed fracture of right hip ([place]) 02/09/2016   Chest pain 11/07/2015   COPD (chronic obstructive pulmonary disease) ([place]) 08/19/2013   Abnormal EKG 08/19/2013   Fever, unspecified 08/19/2013    Past Surgical History:  Procedure Laterality Date   CESAREAN SECTION     Left Foot Reconstruction     OPEN REDUCTION INTERNAL FIXATION (ORIF) METACARPAL Right 03/10/2015   Procedure: OPEN REDUCTION INTERNAL FIXATION (ORIF) RIGHT LONG  METACARPAL FRACTURE;  Surgeon: Leanora Cover, MD;  Location: [place];  Service: Orthopedics;  Laterality: Right;   TONSILLECTOMY     TOTAL HIP ARTHROPLASTY Right 02/09/2016  Procedure: TOTAL HIP ARTHROPLASTY ANTERIOR APPROACH;  Surgeon: Frederik Pear, MD;  Location: [facility];  Service: Orthopedics;  Laterality: Right;     OB History     Gravida      Para      Term      Preterm      AB      Living  1      SAB      IAB      Ectopic      Multiple      Live Births              Family History  Problem Relation Age of Onset   Dementia Father     Social History   Tobacco Use    Smoking status: Former    Packs/day: 2.00    Years: 30.00    Pack years: 60.00    Types: Cigarettes    Quit date: 04/1995    Years since quitting: 25.6   Smokeless tobacco: Never  Vaping Use   Vaping Use: Never used  Substance Use Topics   Alcohol use: Yes   Drug use: No    Home Medications Prior to Admission medications   Medication Sig Start Date End Date Taking? Authorizing Provider  albuterol (VENTOLIN HFA) 108 (90 Base) MCG/ACT inhaler Inhale 2 puffs into the lungs every 4 (four) hours as needed for wheezing or shortness of breath. 09/30/20   Isla Pence, MD  aspirin EC 81 MG tablet Take 1 tablet (81 mg total) by mouth daily. 10/22/20   Thurnell Lose, MD  atorvastatin (LIPITOR) 40 MG tablet Take 1 tablet (40 mg total) by mouth at bedtime. 10/22/20   Thurnell Lose, MD  busPIRone (BUSPAR) 10 MG tablet Take 10 mg by mouth 2 (two) times daily. 01/29/20   [provider]  calcium carbonate (TUMS - DOSED IN MG ELEMENTAL CALCIUM) 500 MG chewable tablet Chew 1 tablet by mouth daily as needed for indigestion or heartburn.    [provider]  carvedilol (COREG) 6.25 MG tablet Take 1 tablet (6.25 mg total) by mouth 2 (two) times daily with a meal. 10/23/20 11/22/20  Thurnell Lose, MD  ferrous sulfate 325 (65 FE) MG tablet Take 1 tablet (325 mg total) by mouth 2 (two) times daily with a meal. 12/10/18   Hongalgi, Lenis Dickinson, MD  folic acid (FOLVITE) 1 MG tablet Take 1 tablet (1 mg total) by mouth daily. 12/11/18   Hongalgi, Lenis Dickinson, MD  meclizine (ANTIVERT) 25 MG tablet Take 1 tablet (25 mg total) by mouth 3 (three) times daily as needed for dizziness. 10/22/20   Thurnell Lose, MD  Multiple Vitamin (MULTIVITAMIN WITH MINERALS) TABS tablet Take 1 tablet by mouth daily. 12/11/18   Hongalgi, Lenis Dickinson, MD  OLANZapine (ZYPREXA) 2.5 MG tablet Take 2.5 mg by mouth at bedtime.    [provider]  pantoprazole (PROTONIX) 40 MG tablet Take 1 tablet (40 mg total) by mouth  daily. 10/22/20   Thurnell Lose, MD  QUEtiapine (SEROQUEL) 100 MG tablet Take 2 tablets (200 mg total) by mouth at bedtime. 10/22/20   Thurnell Lose, MD  spironolactone (ALDACTONE) 25 MG tablet Take 25 mg by mouth daily.    [provider]  thiamine 100 MG tablet Take 1 tablet (100 mg total) by mouth daily. 12/11/18   Hongalgi, Lenis Dickinson, MD  tiZANidine (ZANAFLEX) 2 MG tablet Take 2 mg by mouth at bedtime.  [provider]    Allergies    Levofloxacin and Tape  Review of Systems   Review of Systems  Constitutional:  Negative for chills and fever.  Respiratory:  Negative for cough and shortness of breath.   Cardiovascular:  Negative for chest pain and palpitations.  Gastrointestinal:  Negative for abdominal pain and vomiting.  Genitourinary:  Negative for dysuria and hematuria.  Musculoskeletal:  Negative for arthralgias and back pain.  Skin:  Negative for color change and rash.  Neurological:  Positive for headaches. Negative for syncope.  All other systems reviewed and are negative.  Physical Exam Updated Vital Signs BP (!) 154/78   Pulse 87   Temp 98.9 F (37.2 C) (Oral)   Resp 15   Ht '5\' 2"'$  (1.575 m)   Wt 50.3 kg   SpO2 94%   BMI 20.28 kg/m   Physical Exam Constitutional:      General: She is not in acute distress. HENT:     Head: Normocephalic and atraumatic.  Eyes:     Conjunctiva/sclera: Conjunctivae normal.     Pupils: Pupils are equal, round, and reactive to light.  Cardiovascular:     Rate and Rhythm: Normal rate and regular rhythm.  Pulmonary:     Effort: Pulmonary effort is normal. No respiratory distress.  Abdominal:     General: There is no distension.     Tenderness: There is no abdominal tenderness.  Skin:    General: Skin is warm and dry.  Neurological:     Mental Status: She is alert.     GCS: GCS eye subscore is 4. GCS verbal subscore is 5. GCS motor subscore is 6.     Cranial Nerves: Cranial nerves are intact.      Sensory: Sensation is intact.     Comments: AAO x 1 Moving all extremities   Presenting  ED Results / Procedures / Treatments   Labs (all labs ordered are listed, but only abnormal results are displayed) Labs Reviewed  BASIC METABOLIC PANEL - Abnormal; Notable for the following components:      Result Value   Sodium 129 (*)    Chloride 97 (*)    CO2 21 (*)    Glucose, Bld 142 (*)    All other components within normal limits  CBC WITH DIFFERENTIAL/PLATELET - Abnormal; Notable for the following components:   Abs Immature Granulocytes 0.10 (*)    All other components within normal limits  HEPATIC FUNCTION PANEL - Abnormal; Notable for the following components:   Indirect Bilirubin 1.0 (*)    All other components within normal limits  AMMONIA  ETHANOL  RAPID URINE DRUG SCREEN, HOSP PERFORMED  URINALYSIS, ROUTINE W REFLEX MICROSCOPIC  TROPONIN I (HIGH SENSITIVITY)  TROPONIN I (HIGH SENSITIVITY)    EKG EKG Interpretation  Date/Time:  Friday December 01 2020 11:29:44 EDT Ventricular Rate:  87 PR Interval:  214 QRS Duration: 96 QT Interval:  389 QTC Calculation: 452 R Axis:   89 Text Interpretation: Sinus rhythm Atrial premature complex Consider left atrial enlargement Borderline right axis deviation Confirmed by Octaviano Glow [phone]) on 12/01/2020 12:19:53 PM  Radiology CT HEAD WO CONTRAST (5MM)  Result Date: 12/01/2020 CLINICAL DATA:  Altered mental status EXAM: CT HEAD WITHOUT CONTRAST TECHNIQUE: Contiguous axial images were obtained from the base of the skull through the vertex without intravenous contrast. COMPARISON:  CT head 10/29/2020, brain MRI 10/19/2020 FINDINGS: Brain: There is an acute right parafalcine subdural hematomar measuring up to 9 mm in  the coronal plane. There is mild mass effect on the underlying brain parenchyma. There is scattered subarachnoid hemorrhage predominantly in the right sylvian fissure. There is no intraventricular hemorrhage. There is a  prior infarct in the right caudate head. Additional foci of hypodensity in the subcortical and periventricular white matter likely reflect sequela of chronic white matter microangiopathy. There is no mass lesion. There is no midline shift. The ventricles are not enlarged. Vascular: No hyperdense vessel or unexpected calcification. Skull: There is a right parieto-occipital scalp hematoma without underlying calvarial fracture. Sinuses/Orbits: The paranasal sinuses are clear. Bilateral lens implants are in place. The globes and orbits are otherwise unremarkable. Other: None. IMPRESSION: 1. Acute right parafalcine subdural hematoma and scattered subarachnoid hemorrhage overlying the right cerebral hemisphere. 2. No intraventricular extension or midline shift. 3. Right parieto-occipital scalp hematoma without underlying calvarial fracture. Critical Value/emergent results were called by telephone at the time of interpretation on 12/01/2020 at 12:25 pm to provider Marionna Gonia , who verbally acknowledged these results. Electronically Signed   By: Valetta Mole M.D.   On: 12/01/2020 12:35   DG Chest Portable 1 View  Result Date: 12/01/2020 CLINICAL DATA:  Syncope. EXAM: PORTABLE CHEST 1 VIEW COMPARISON:  October 29, 2020. FINDINGS: The heart size and mediastinal contours are within normal limits. Both lungs are clear. The visualized skeletal structures are unremarkable. IMPRESSION: No active disease. Electronically Signed   By: Marijo Conception M.D.   On: 12/01/2020 12:27    Procedures .Critical Care  Date/Time: 12/01/2020 1:39 PM Performed by: Wyvonnia Dusky, MD Authorized by: Wyvonnia Dusky, MD   Critical care provider statement:    Critical care time (minutes):  45   Critical care was necessary to treat or prevent imminent or life-threatening deterioration of the following conditions:  CNS failure or compromise   Critical care was time spent personally by me on the following activities:  Discussions with  consultants, evaluation of patient's response to treatment, examination of patient, ordering and performing treatments and interventions, ordering and review of laboratory studies, ordering and review of radiographic studies, pulse oximetry, re-evaluation of patient's condition, obtaining history from patient or surrogate and review of old charts   Care discussed with: admitting provider   Comments:     Stroke repeat neuro assessments, IV keppra   Medications Ordered in ED Medications  levETIRAcetam (KEPPRA) IVPB 1500 mg/ 100 mL premix (has no administration in time range)  ondansetron (ZOFRAN) injection 4 mg (4 mg Intravenous Given 12/01/20 1321)  dexamethasone (DECADRON) injection 10 mg (10 mg Intravenous Given 12/01/20 1321)    ED Course  I have reviewed the triage vital signs and the nursing notes.  Pertinent labs & imaging results that were available during my care of the patient were reviewed by me and considered in my medical decision making (see chart for details).  Patient presented by EMS concern for altered mental status and found unresponsive at home, now awake and able to verbalize, but does appear confused.  CT scan shows brain bleed which may be a subarachnoid hemorrhage versus hemorrhagic conversion of her prior stroke versus other cause.  Labs ordered and reviewed.  Mild hyponatremia, otherwise labs are unremarkable. Patient's prior medical records were also reviewed Could not reach family to confirm patient's CODE STATUS or discuss the patient's diagnosis or care at this time.  I personally reviewed her image including her x-ray of the chest, which was unremarkable, her CT head scan of the head, as noted above.  Consulted neurosurgery who admitted the patient.  I also spoke to neurology we will set her up with an EEG, as there is some question about possible subclinical seizures and shaking of the lower legs.  IV Keppra ordered in discussion with the neurosurgery team.  IV  Zofran ordered for nausea, Decadron for swelling and nausea.   Clinical Course as of 12/01/20 1339  Fri Dec 01, 2020  1226 SAH and subdural noted, paging neurosurgery [MT]  1229 Consult placed through [place] office [MT]  1231 Patient still appears to be mentating well.  She reports patient does have a headache now and feels nauseous.  Zofran is ordered.  We will recheck her blood pressure. [MT]  1231 Per medical record review she does not appear to be on blood thinners, and cannot recall her medications. [MT]  67 I spoke to Dr Arnoldo Morale from [place] who advised okay for keppra load if question of seizure, SBP goal of 160 mmhg, and they will evaluate th patient for admission. [MT]  1249 No answer from daughter by phone [MT]  1317 BP 150's now, within goal parameters.  NSGY team at bedside and will admit patient for hemorrhagic stroke [MT]    Clinical Course User Index [MT] Jvon Meroney, Carola Rhine, MD    Final Clinical Impression(s) / ED Diagnoses Final diagnoses:  Cerebrovascular accident (CVA), unspecified mechanism [facility])    Rx / DC Orders ED Discharge Orders     None        Wyvonnia Dusky, MD 12/01/20 1340

## 2020-12-02 ENCOUNTER — Inpatient Hospital Stay (HOSPITAL_COMMUNITY): Payer: Medicare Other

## 2020-12-02 LAB — BASIC METABOLIC PANEL
Anion gap: 10 (ref 5–15)
BUN: 11 mg/dL (ref 8–23)
CO2: 23 mmol/L (ref 22–32)
Calcium: 9.2 mg/dL (ref 8.9–10.3)
Chloride: 97 mmol/L — ABNORMAL LOW (ref 98–111)
Creatinine, Ser: 0.84 mg/dL (ref 0.44–1.00)
GFR, Estimated: >60 mL/min (ref >60)
Glucose, Bld: 158 mg/dL — ABNORMAL HIGH (ref 70–99)
Potassium: 4.5 mmol/L (ref 3.5–5.1)
Sodium: 130 mmol/L — ABNORMAL LOW (ref 135–145)

## 2020-12-02 LAB — CBC
HCT: 36.8 % (ref 36.0–46.0)
Hemoglobin: 13.2 g/dL (ref 12.0–15.0)
MCH: 32.2 pg (ref 26.0–34.0)
MCHC: 35.9 g/dL (ref 30.0–36.0)
MCV: 89.8 fL (ref 80.0–100.0)
Platelets: 232 10*3/uL (ref 150–400)
RBC: 4.1 MIL/uL (ref 3.87–5.11)
RDW: 13.3 % (ref 11.5–15.5)
WBC: 5.2 10*3/uL (ref 4.0–10.5)
nRBC: 0 % (ref 0.0–0.2)

## 2020-12-02 LAB — URINALYSIS, ROUTINE W REFLEX MICROSCOPIC
Bacteria, UA: NONE SEEN
Bilirubin Urine: NEGATIVE
Glucose, UA: NEGATIVE mg/dL
Hgb urine dipstick: NEGATIVE
Ketones, ur: NEGATIVE mg/dL
Nitrite: NEGATIVE
Protein, ur: NEGATIVE mg/dL
Specific Gravity, Urine: 1.019 (ref 1.005–1.030)
pH: 6 (ref 5.0–8.0)

## 2020-12-02 LAB — RAPID URINE DRUG SCREEN, HOSP PERFORMED
Amphetamines: NOT DETECTED
Barbiturates: NOT DETECTED
Benzodiazepines: NOT DETECTED
Cocaine: NOT DETECTED
Opiates: NOT DETECTED
Tetrahydrocannabinol: NOT DETECTED

## 2020-12-02 MED ORDER — SODIUM CHLORIDE 0.9 % IV SOLN
INTRAVENOUS | Status: DC | PRN
  Administered 2020-12-02: 250 mL via INTRAVENOUS

## 2020-12-02 MED ORDER — CHLORHEXIDINE GLUCONATE CLOTH 2 % EX PADS
6.0000 | MEDICATED_PAD | Freq: Every day | CUTANEOUS | Status: AC
  Administered 2020-12-02 – 2020-12-05 (×5): 6 via TOPICAL

## 2020-12-02 MED ORDER — MUPIROCIN 2 % EX OINT
1.0000 "application " | TOPICAL_OINTMENT | Freq: Two times a day (BID) | CUTANEOUS | Status: AC
  Administered 2020-12-02 – 2020-12-06 (×10): 1 via NASAL
  Filled 2020-12-02 (×3): qty 22

## 2020-12-02 NOTE — Progress Notes (Signed)
Went down for CT of the head and back to her room.Tolerated procedure.

## 2020-12-02 NOTE — Evaluation (Addendum)
Physical Therapy Evaluation Patient Details Name: Kim Blankenship MRN: [id number] DOB: 1945-06-13 Today's Date: 12/02/2020   History of Present Illness  This 75 y.o. female admitted after being found on floor by her female friend/house mate. CT showed acure Rt parafalcine SDH and scattered SAH overlying the Rt cerebral hemisphere.  There was aslo question if she had a focal seizure.  PMH includes: A-Fib, recent stroke, COPD, CA, HTN, PRES, s/p Rt THA  Clinical Impression  Patient is poor historian due to STM deficits and A&Ox1 this date. History obtained from previous admission (09/2020). Patient lives with her daughter and is independent with mobility. Patient does not recall events leading up to admission. Patient unsure of current location. Patient requires supervision for mobility with no AD. Patient with complaints of increasing headache with mobility so deferred further mobility. Patient presents with generalized weakness, decreased activity tolerance, impaired balance, and impaired cognition. Patient will benefit from skilled PT services during acute stay to address listed deficits. No PT follow up recommended at this time, however patient will require 24 hour supervision for safety due to cognitive deficits.     Follow Up Recommendations No PT follow up;Supervision/Assistance - 24 hour    Equipment Recommendations  None recommended by PT    Recommendations for Other Services       Precautions / Restrictions Precautions Precautions: Fall Precaution Comments: SBP <160 Restrictions Weight Bearing Restrictions: No      Mobility  Bed Mobility Overal bed mobility: Modified Independent                  Transfers Overall transfer level: Needs assistance Equipment used: None Transfers: Sit to/from Stand Sit to Stand: Supervision         General transfer comment: supervision for safety. Upon standing, patient complained of mild dizziness and headache. BP in standing  158/89 (109)  Ambulation/Gait Ambulation/Gait assistance: Supervision Gait Distance (Feet): 30 Feet Assistive device: None Gait Pattern/deviations: Step-through pattern;Decreased stride length Gait velocity: decreased   General Gait Details: supervision for safety, no LOB noted. Provided instructions on directions but patient required cues to attend to directions given. Patient with increasing headache with ambulation so returned to room. BP following ambulation 153/97 (113)  Stairs            Wheelchair Mobility    Modified Rankin (Stroke Patients Only) Modified Rankin (Stroke Patients Only) Pre-Morbid Rankin Score: Moderate disability Modified Rankin: Moderately severe disability     Balance Overall balance assessment: Mild deficits observed, not formally tested                                           Pertinent Vitals/Pain Pain Assessment: Faces Faces Pain Scale: Hurts whole lot Pain Location: head with changing positions Pain Descriptors / Indicators: Discomfort;Grimacing;Guarding;Headache Pain Intervention(s): Monitored during session;Repositioned;Limited activity within patient's tolerance    Home Living Family/patient expects to be discharged to:: Private residence Living Arrangements: Children Available Help at Discharge: Family;Available PRN/intermittently Type of Home: House Home Access: Stairs to enter Entrance Stairs-Rails: Right Entrance Stairs-Number of Steps: 2 Home Layout: One level Home Equipment: None Additional Comments: Information gathered from recent admission (09/2020) due to impaired cognition and A&Ox1    Prior Function Level of Independence: Independent         Comments: daughter drives, denies falls in last 3 months and does not recall most recent fall.  Hand Dominance        Extremity/Trunk Assessment   Upper Extremity Assessment Upper Extremity Assessment: Defer to OT evaluation    Lower Extremity  Assessment Lower Extremity Assessment: Generalized weakness       Communication   Communication: No difficulties  Cognition Arousal/Alertness: Awake/alert Behavior During Therapy: WFL for tasks assessed/performed Overall Cognitive Status: Impaired/Different from baseline Area of Impairment: Orientation;Attention;Memory;Safety/judgement;Problem solving                 Orientation Level: Disoriented to;Place;Time;Situation Current Attention Level: Sustained Memory: Decreased short-term memory   Safety/Judgement: Decreased awareness of safety   Problem Solving: Slow processing;Requires verbal cues General Comments: Patient oriented to self. Able to state "hospital" but when asked which one, patient states "[facility]". STM deficits noted. Unable to recall events leading up to hospitalization. With attempts to reorient, patient stated "i don't remember any of that and no one has told me" even though RN had previously oriented her to situation      General Comments      Exercises     Assessment/Plan    PT Assessment Patient needs continued PT services  PT Problem List Decreased strength;Decreased activity tolerance;Decreased balance;Decreased mobility;Decreased cognition;Decreased safety awareness       PT Treatment Interventions DME instruction;Gait training;Functional mobility training;Stair training;Therapeutic activities;Therapeutic exercise;Balance training;Patient/family education    PT Goals (Current goals can be found in the Care Plan section)  Acute Rehab PT Goals Patient Stated Goal: to go home PT Goal Formulation: Patient unable to participate in goal setting Time For Goal Achievement: 12/16/20 Potential to Achieve Goals: Good    Frequency Min 4X/week   Barriers to discharge        Co-evaluation               AM-PAC PT "6 Clicks" Mobility  Outcome Measure Help needed turning from your back to your side while in a flat bed without  using bedrails?: None Help needed moving from lying on your back to sitting on the side of a flat bed without using bedrails?: None Help needed moving to and from a bed to a chair (including a wheelchair)?: A Little Help needed standing up from a chair using your arms (e.g., wheelchair or bedside chair)?: A Little Help needed to walk in hospital room?: A Little Help needed climbing 3-5 steps with a railing? : A Little 6 Click Score: 20    End of Session Equipment Utilized During Treatment: Gait belt Activity Tolerance: Patient tolerated treatment well Patient left: in chair;with call bell/phone within reach;with chair alarm set Nurse Communication: Mobility status PT Visit Diagnosis: Unsteadiness on feet (R26.81);Muscle weakness (generalized) (M62.81);History of falling (Z91.81)    Time: [id number] PT Time Calculation (min) (ACUTE ONLY): 17 min   Charges:   PT Evaluation $PT Eval Low Complexity: 1 Low          Lovetta Condie A. Gilford Rile PT, DPT Acute Rehabilitation Services Pager [phone] Office [phone]   Linna Hoff 12/02/2020, 9:02 AM

## 2020-12-02 NOTE — Progress Notes (Signed)
Subjective: The patient is alert and pleasant.  She feels better.  She wants to eat.  Objective: Vital signs in last 24 hours: Temp:  [98.4 F (36.9 C)-99.1 F (37.3 C)] 99.1 F (37.3 C) (09/03 0800) Pulse Rate:  [65-97] 82 (09/03 0700) Resp:  [11-30] 24 (09/03 0800) BP: (120-182)/(68-98) 145/80 (09/03 0800) SpO2:  [90 %-98 %] 93 % (09/03 0700) Weight:  [50.3 kg] 50.3 kg (09/02 1135) Estimated body mass index is 20.28 kg/m as calculated from the following:   Height as of this encounter: '5\' 2"'$  (1.575 m).   Weight as of this encounter: 50.3 kg.   Intake/Output from previous day: 09/02 0701 - 09/03 0700 In: -  Out: 700 [Urine:700] Intake/Output this shift: No intake/output data recorded.  Physical exam the patient is alert and oriented x3.  Her speech and strength is normal.  I have reviewed the patient's head CT performed today.  It looks better.  The subarachnoid hemorrhage has largely resolved.  Her and hemispheric subdural hematoma is smaller.  Lab Results: Recent Labs    12/01/20 1142 12/02/20 0103  WBC 8.5 5.2  HGB 13.2 13.2  HCT 38.1 36.8  PLT 250 232   BMET Recent Labs    12/01/20 1142 12/02/20 0103  NA 129* 130*  K 4.0 4.5  CL 97* 97*  CO2 21* 23  GLUCOSE 142* 158*  BUN 11 11  CREATININE 0.85 0.84  CALCIUM 9.3 9.2    Studies/Results: CT HEAD WO CONTRAST (5MM)  Result Date: 12/02/2020 CLINICAL DATA:  Subarachnoid hemorrhage follow-up EXAM: CT HEAD WITHOUT CONTRAST TECHNIQUE: Contiguous axial images were obtained from the base of the skull through the vertex without intravenous contrast. COMPARISON:  CT from yesterday FINDINGS: Brain: Subarachnoid hemorrhage along the right sylvian fissure has diffused. Intraventricular clot is now seen dependently in the right lateral ventricle. A subdural hematoma along the falx has also diffused, maximal thickness is 5 mm. No infarct, hydrocephalus, or masslike finding Vascular: No hyperdense vessel or unexpected  calcification. Skull: No acute finding. 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 Sinuses/Orbits: No visible injury IMPRESSION: 1. Diffused subarachnoid hemorrhage with small volume layering intraventricular clot. No hydrocephalus. 2. More diffuse right parafalcine subdural hematoma but decreased maximal thickness now measuring 5 mm. Electronically Signed   By: Monte Fantasia M.D.   On: 12/02/2020 05:15   CT HEAD WO CONTRAST (5MM)  Result Date: 12/01/2020 CLINICAL DATA:  Altered mental status EXAM: CT HEAD WITHOUT CONTRAST TECHNIQUE: Contiguous axial images were obtained from the base of the skull through the vertex without intravenous contrast. COMPARISON:  CT head 10/29/2020, brain MRI 10/19/2020 FINDINGS: Brain: There is an acute right parafalcine subdural hematomar measuring up to 9 mm in the coronal plane. There is mild mass effect on the underlying brain parenchyma. There is scattered subarachnoid hemorrhage predominantly in the right sylvian fissure. There is no intraventricular hemorrhage. There is a prior infarct in the right caudate head. Additional foci of hypodensity in the subcortical and periventricular white matter likely reflect sequela of chronic white matter microangiopathy. There is no mass lesion. There is no midline shift. The ventricles are not enlarged. Vascular: No hyperdense vessel or unexpected calcification. Skull: There is a right parieto-occipital scalp hematoma without underlying calvarial fracture. Sinuses/Orbits: The paranasal sinuses are clear. Bilateral lens implants are in place. The globes and orbits are otherwise unremarkable. Other: None. IMPRESSION: 1. Acute right parafalcine subdural hematoma and scattered subarachnoid hemorrhage overlying the right cerebral hemisphere. 2. No intraventricular  extension or midline shift. 3. Right parieto-occipital scalp hematoma without underlying calvarial fracture. Critical Value/emergent results were called by telephone  at the time of interpretation on 12/01/2020 at 12:25 pm to provider MATTHEW TRIFAN , who verbally acknowledged these results. Electronically Signed   By: Valetta Mole M.D.   On: 12/01/2020 12:35   DG Chest Portable 1 View  Result Date: 12/01/2020 CLINICAL DATA:  Syncope. EXAM: PORTABLE CHEST 1 VIEW COMPARISON:  October 29, 2020. FINDINGS: The heart size and mediastinal contours are within normal limits. Both lungs are clear. The visualized skeletal structures are unremarkable. IMPRESSION: No active disease. Electronically Signed   By: Marijo Conception M.D.   On: 12/01/2020 12:27   EEG adult  Result Date: 12/01/2020 Lora Havens, MD     12/01/2020  2:47 PM Patient Name: Kim Blankenship MRN: [id number] Epilepsy Attending: Lora Havens Referring Physician/Provider: Dr Su Monks Date: 12/01/2020 Duration: 25.39 mins Patient history: 75yo F with right SDH and BL lower extremity twitching. EEG to evaluate for seizure. Level of alertness: Awake AEDs during EEG study: LEV Technical aspects: This EEG study was done with scalp electrodes positioned according to the 10-20 International system of electrode placement. Electrical activity was acquired at a sampling rate of '500Hz'$  and reviewed with a high frequency filter of '70Hz'$  and a low frequency filter of '1Hz'$ . EEG data were recorded continuously and digitally stored. Description: The posterior dominant rhythm consists of 9 Hz activity of moderate voltage (25-35 uV) seen predominantly in posterior head regions, symmetric and reactive to eye opening and eye closing.  EEG showed intermittent generalized, maximal bifrontal 3 to 5 Hz theta-delta slowing.  Sharp transients were noted in right frontal region. Patient was noted to have bilateral lower extremity stiffening/jerking intermittently throughout the study. Concomitant EEG before, during and after the event did not show any EEG to suggest seizure. Hyperventilation and photic stimulation were not performed.    ABNORMALITY -Intermittent slow, generalized and maximal bifrontal IMPRESSION: This study is suggestive of nonspecific cortical dysfunction in bifrontal region as well as mild diffuse encephalopathy, nonspecific etiology.  Patient was noted to have bilateral lower extremity stiffening/jerking intermittently throughout the study without concomitant EEG change.  These episodes were most likely not epileptic.  No seizures or definite epileptiform discharges were seen throughout the recording. If suspicion for ictal-interictal activity remains a concern, a prolonged study can be considered. Priyanka Barbra Sarks    Assessment/Plan: Traumatic subarachnoid hemorrhage, subdural hematoma: The patient is doing well.  We will start a diet and transfer her to the floor.  She will likely go home tomorrow.  LOS: 1 day     Ophelia Charter 12/02/2020, 9:02 AM     Patient ID: Lavone Neri, female   DOB: 1946-01-26, 75 y.o.   MRN: [id number]
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
## 2020-12-03 MED ORDER — LORAZEPAM 2 MG/ML IJ SOLN
INTRAMUSCULAR | Status: AC
  Administered 2020-12-03: 0.5 mg via INTRAVENOUS
  Filled 2020-12-03: qty 1

## 2020-12-03 MED ORDER — LEVETIRACETAM 500 MG PO TABS
500.0000 mg | ORAL_TABLET | Freq: Two times a day (BID) | ORAL | Status: DC
  Administered 2020-12-03 – 2020-12-13 (×21): 500 mg via ORAL
  Filled 2020-12-03 (×23): qty 1

## 2020-12-03 MED ORDER — LORAZEPAM 2 MG/ML IJ SOLN: 0.5000 mg | Freq: Once | INTRAMUSCULAR | Status: AC

## 2020-12-03 MED ORDER — LORAZEPAM 0.5 MG PO TABS
0.5000 mg | ORAL_TABLET | Freq: Once | ORAL | Status: AC
  Administered 2020-12-07: 0.5 mg via ORAL
  Filled 2020-12-03 (×3): qty 1

## 2020-12-03 NOTE — Progress Notes (Signed)
Pt transferred to room [address]. Bedside handoff with [facility] Pt in posey soft waist belt, bed low, alarm on. Pt calm and cooperative. Pt's sister [name] [phone]) updated of pt's new room number.
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
## 2020-12-03 NOTE — Progress Notes (Signed)
Pt has become increasingly confused and refuses to where telemetry monitoring and have VS checked. Provider notified. See orders.

## 2020-12-03 NOTE — Progress Notes (Signed)
   Providing Compassionate, Quality Care - Together  NEUROSURGERY PROGRESS NOTE   S: Was somewhat confused and combative overnight, received Ativan  O: EXAM:  BP (!) 149/85   Pulse 64   Temp (!) 96.6 F (35.9 C) (Axillary) Comment: RN Notified  Resp 12   Ht '5\' 2"'$  (1.575 m)   Wt 50.3 kg   SpO2 95%   BMI 20.28 kg/m   Opens eyes to stimulation, communicates PERRLA Moves all extremities equally Follows commands x4  ASSESSMENT:  75 y.o. female with  Subdural hematoma with subarachnoid hemorrhage  PLAN: -Rehab pending, transfer to the floor pending    Thank you for allowing me to participate in this patient's care.  Please do not hesitate to call with questions or concerns.   Elwin Sleight, [facility] Cell: [phone]
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
## 2020-12-04 NOTE — Progress Notes (Signed)
Patient became combative and belligerent concerning using the bathroom to the NT when taking 0400 AM vital signs. Patient was reoriented and explained the use of a purewick. Patient was more compliant after education and used purewick.
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
## 2020-12-05 LAB — BASIC METABOLIC PANEL
Anion gap: 9 (ref 5–15)
BUN: 7 mg/dL — ABNORMAL LOW (ref 8–23)
CO2: 28 mmol/L (ref 22–32)
Calcium: 9.5 mg/dL (ref 8.9–10.3)
Chloride: 98 mmol/L (ref 98–111)
Creatinine, Ser: 0.82 mg/dL (ref 0.44–1.00)
GFR, Estimated: >60 mL/min (ref >60)
Glucose, Bld: 122 mg/dL — ABNORMAL HIGH (ref 70–99)
Potassium: 3.6 mmol/L (ref 3.5–5.1)
Sodium: 135 mmol/L (ref 135–145)

## 2020-12-05 LAB — SARS CORONAVIRUS 2 (TAT 6-24 HRS): SARS Coronavirus 2: NEGATIVE

## 2020-12-05 NOTE — Plan of Care (Signed)
  Problem: Safety: Goal: Non-violent Restraint(s) Outcome: Progressing   Problem: Education: Goal: Knowledge of General Education information will improve Description: Including pain rating scale, medication(s)/side effects and non-pharmacologic comfort measures Outcome: Progressing   Problem: Health Behavior/Discharge Planning: Goal: Ability to manage health-related needs will improve Outcome: Progressing   Problem: Clinical Measurements: Goal: Ability to maintain clinical measurements within normal limits will improve Outcome: Progressing Goal: Will remain free from infection Outcome: Progressing Goal: Diagnostic test results will improve Outcome: Progressing Goal: Respiratory complications will improve Outcome: Progressing Goal: Cardiovascular complication will be avoided Outcome: Progressing   Problem: Coping: Goal: Level of anxiety will decrease Outcome: Progressing   Problem: Pain Managment: Goal: General experience of comfort will improve Outcome: Progressing   Problem: Safety: Goal: Ability to remain free from injury will improve Outcome: Progressing

## 2020-12-05 NOTE — Care Management Important Message (Signed)
Important Message  Patient Details  Name: Kim Blankenship MRN: [id number] Date of Birth: [birth year]   Medicare Important Message Given:  Yes     Orbie Pyo 12/05/2020, 3:13 PM

## 2020-12-05 NOTE — TOC Initial Note (Addendum)
Transition of Care [name]) - Initial/Assessment Note    Patient Details  Name: Kim Blankenship MRN: [id number] Date of Birth: 03/28/1946  Transition of Care [name]) CM/SW Contact:    Kim Friar, RN Phone Number: 12/05/2020, 11:18 AM  Clinical Narrative:                 Patient continues to be confused. Recommendations are for ALF with HH. CM reached out to her sister in [place], who asked me to call their brother, Kim Blankenship in [place]. Kim Blankenship is the family member that will be helping to make decisions.  Per Kim Blankenship pt was living in [place] with Kim Blankenship (sister) for a little while but pts Daughter Kim Blankenship) talked her into moving back to [facility]. In [place] they have been living with a gentleman named, Kim Blankenship. Per Kim Blankenship doesn't feel he can manage the patient any longer so she will not have a place at d/c to go to. Kim Blankenship is currently incarcerated.  CM inquired about patient returning to Kim Blankenship's home but Kim Blankenship is unsure that Kim Blankenship can manage her at this time. Kim Blankenship is interested in having pt admit to an ALF either in [place] or [place] near them. Kim Blankenship is getting CM information on ALF in both locations and talking to [name].  TOC following.   1600: Received information from [name] on ALFs in his and Kim Blankenship areas. CM has spoken to [place], [facility]F and sent them the requested information. I have left messages for [facility], [facility].  Cm has also talked to ALF locally about respite care in case we are unable to get her into a facility in [place] or [place] in a timely manner.    Expected Discharge Plan: Long Term Nursing Home Barriers to Discharge: Continued Medical Work up   Patient Goals and CMS Choice   CMS Medicare.gov Compare Post Acute Care list provided to:: Patient Represenative (must comment) Choice offered to / list presented to : Sibling (Brother and sister)  Expected Discharge Plan and Services Expected Discharge Plan: [place]   Discharge  Planning Services: CM Consult Post Acute Care Choice: [place] Living arrangements for the past 2 months: Apartment                                      Prior Living Arrangements/Services Living arrangements for the past 2 months: Apartment Lives with:: Roommate, Adult Children (roommate and her daughter) Patient language and need for interpreter reviewed:: Yes Do you feel safe going back to the place where you live?: Yes      Need for Family Participation in Patient Care: Yes (Comment) Care giver support system in place?: No (comment)   Criminal Activity/Legal Involvement Pertinent to Current Situation/Hospitalization: No - Comment as needed  Activities of Daily Living      Permission Sought/Granted                  Emotional Assessment Appearance:: Appears stated age     Orientation: : Oriented to Self, Oriented to Place   Psych Involvement: No (comment)  Admission diagnosis:  Subarachnoid hemorrhage ([place]) [I60.9] Cerebrovascular accident (CVA), unspecified mechanism ([place]) [I63.9] Patient Active Problem List   Diagnosis Date Noted   Subarachnoid hemorrhage ([place]) 12/01/2020   Cerebral embolism with cerebral infarction 10/21/2020   Dizziness 10/19/2020   Unsteady gait 10/19/2020   Mass of upper lobe of  left lung 10/19/2020   Leukocytosis 10/19/2020   Stroke-like symptoms    Pressure injury of skin 10/12/2020   COPD exacerbation ([place]) 10/11/2020   History of stroke [id number]   Acute metabolic encephalopathy Q000111Q   Acute respiratory failure with hypoxia ([place]) 07/23/2019   Acute encephalopathy 07/22/2019   Overdose opiate, accidental or unintentional, initial encounter ([place]) 07/22/2019   Vulvar cancer ([place]) 07/22/2019   Delirium    GIB (gastrointestinal bleeding) 12/04/2018   Chronic diastolic CHF (congestive heart failure) ([place]) 12/04/2018   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   Stroke  (cerebrum) ([place]) 11/21/2016   Hyponatremia 11/20/2016   UTI (urinary tract infection) 11/20/2016   HLD (hyperlipidemia) 11/20/2016   GERD (gastroesophageal reflux disease) 11/20/2016   Depression 11/20/2016   Hypertensive emergency 11/04/2016   EtOH dependence ([place]) 11/04/2016   Hyperglycemia 11/04/2016   Essential hypertension    Hypertensive urgency 10/30/2016   PAF (paroxysmal atrial fibrillation) ([place]) 10/30/2016   PRES (posterior reversible encephalopathy syndrome) 10/30/2016   Seizure ([place])    Acute blood loss anemia    Closed fracture of right hip ([place]) 02/09/2016   Chest pain 11/07/2015   COPD (chronic obstructive pulmonary disease) ([place]) 08/19/2013   Abnormal EKG 08/19/2013   Fever, unspecified 08/19/2013   PCP:  Jolinda Croak, MD Pharmacy:   [place], [place] - [address] [place] [place] [address] Phone: [phone] Fax: [place], [place] [address] Phone: [phone] Fax: [phone]     Social Determinants of Health (SDOH) Interventions    Readmission Risk Interventions No flowsheet data found.

## 2020-12-05 NOTE — NC FL2 (Signed)
[place] LEVEL OF CARE SCREENING TOOL     IDENTIFICATION  Patient Name: Kim Blankenship Birthdate: [birth year] Sex: female Admission Date (Current Location): 12/01/2020  [facility] [place] Number:  Herbalist and Address:  [facility] [place], [place] [address], [place], [place] [address]      Provider Number: [id number]  Attending Physician Name and Address:  Newman Pies, MD  Relative Name and Phone Number:       Current Level of Care: Hospital Recommended Level of Care: [place] Prior Approval Number:    Date Approved/Denied:   PASRR Number:    Discharge Plan: Other (Comment) (Assisted Living)    Current Diagnoses: Patient Active Problem List   Diagnosis Date Noted   Subarachnoid hemorrhage ([place]) 12/01/2020   Cerebral embolism with cerebral infarction 10/21/2020   Dizziness 10/19/2020   Unsteady gait 10/19/2020   Mass of upper lobe of left lung 10/19/2020   Leukocytosis 10/19/2020   Stroke-like symptoms    Pressure injury of skin 10/12/2020   COPD exacerbation ([place]) 10/11/2020   History of stroke [id number]   Acute metabolic encephalopathy Q000111Q   Acute respiratory failure with hypoxia ([place]) 07/23/2019   Acute encephalopathy 07/22/2019   Overdose opiate, accidental or unintentional, initial encounter ([place]) 07/22/2019   Vulvar cancer ([place]) 07/22/2019   Delirium    GIB (gastrointestinal bleeding) 12/04/2018   Chronic diastolic CHF (congestive heart failure) ([place]) 12/04/2018   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   Stroke (cerebrum) ([place]) 11/21/2016   Hyponatremia 11/20/2016   UTI (urinary tract infection) 11/20/2016   HLD (hyperlipidemia) 11/20/2016   GERD (gastroesophageal reflux disease) 11/20/2016   Depression 11/20/2016   Hypertensive emergency 11/04/2016   EtOH dependence ([place]) 11/04/2016   Hyperglycemia 11/04/2016   Essential hypertension     Hypertensive urgency 10/30/2016   PAF (paroxysmal atrial fibrillation) ([place]) 10/30/2016   PRES (posterior reversible encephalopathy syndrome) 10/30/2016   Seizure ([place])    Acute blood loss anemia    Closed fracture of right hip ([place]) 02/09/2016   Chest pain 11/07/2015   COPD (chronic obstructive pulmonary disease) ([place]) 08/19/2013   Abnormal EKG 08/19/2013   Fever, unspecified 08/19/2013    Orientation RESPIRATION BLADDER Height & Weight        Normal Incontinent Weight: 50.3 kg Height:  '5\' 2"'$  (157.5 cm)  BEHAVIORAL SYMPTOMS/MOOD NEUROLOGICAL BOWEL NUTRITION STATUS      Continent Diet (Regular with thin liquids)  AMBULATORY STATUS COMMUNICATION OF NEEDS Skin   Supervision Verbally Normal                       Personal Care Assistance Level of Assistance  Bathing, Feeding, Dressing Bathing Assistance: Limited assistance Feeding assistance: Independent Dressing Assistance: Independent     Functional Limitations Info  Sight, Speech, Hearing Sight Info: Impaired (wears glasses) Hearing Info: Adequate Speech Info: Adequate    SPECIAL CARE FACTORS FREQUENCY  PT (By licensed PT), OT (By licensed OT)     PT Frequency: home health services for therapy OT Frequency: home health services for therapy            Contractures Contractures Info: Not present    Additional Factors Info  Code Status, Allergies, Psychotropic Code Status Info: Full Allergies Info: tape/ levofloxacin Psychotropic Info: Buspar 10 mg BID/ Keppra 500 mg BID/ Zyprexa 2.5 mg at bedtime/ Seroquel 200 mg at bedtime  Current Medications (12/05/2020):  This is the current hospital active medication list Current Facility-Administered Medications  Medication Dose Route Frequency Provider Last Rate Last Admin   0.9 %  sodium chloride infusion   Intravenous PRN Newman Pies, MD   Stopped at 12/02/20 1550   acetaminophen (TYLENOL) tablet 650 mg  650 mg Oral Q6H PRN Viona Gilmore D, NP   650  mg at 12/04/20 1247   Or   acetaminophen (TYLENOL) suppository 650 mg  650 mg Rectal Q6H PRN Viona Gilmore D, NP       albuterol (VENTOLIN HFA) 108 (90 Base) MCG/ACT inhaler 2 puff  2 puff Inhalation Q4H PRN Viona Gilmore D, NP   2 puff at 12/05/20 0839   atorvastatin (LIPITOR) tablet 40 mg  40 mg Oral QHS Viona Gilmore D, NP   40 mg at 12/04/20 2245   bisacodyl (DULCOLAX) suppository 10 mg  10 mg Rectal Daily PRN Viona Gilmore D, NP       busPIRone (BUSPAR) tablet 10 mg  10 mg Oral BID Viona Gilmore D, NP   10 mg at 12/05/20 1050   calcium carbonate (TUMS - dosed in mg elemental calcium) chewable tablet 200 mg of elemental calcium  1 tablet Oral Daily PRN Viona Gilmore D, NP       docusate sodium (COLACE) capsule 100 mg  100 mg Oral BID Viona Gilmore D, NP   100 mg at 12/05/20 1050   fentaNYL (SUBLIMAZE) injection 12.5-50 mcg  12.5-50 mcg Intravenous Q2H PRN Viona Gilmore D, NP       ferrous sulfate tablet 325 mg  325 mg Oral BID WC Viona Gilmore D, NP   325 mg at [id number]   folic acid (FOLVITE) tablet 1 mg  1 mg Oral Daily Newman Pies, MD   1 mg at 12/05/20 1050   HYDROcodone-acetaminophen (NORCO/VICODIN) 5-325 MG per tablet 1-2 tablet  1-2 tablet Oral Q4H PRN Viona Gilmore D, NP   1 tablet at 12/02/20 1317   labetalol (NORMODYNE) injection 10 mg  10 mg Intravenous Q10 min PRN Viona Gilmore D, NP   10 mg at 12/02/20 1929   levETIRAcetam (KEPPRA) tablet 500 mg  500 mg Oral BID Dawley, Troy C, DO   500 mg at 12/05/20 1050   LORazepam (ATIVAN) tablet 0.5 mg  0.5 mg Oral Once Viona Gilmore D, NP       multivitamin with minerals tablet 1 tablet  1 tablet Oral Daily Newman Pies, MD   1 tablet at 12/05/20 1050   mupirocin ointment (BACTROBAN) 2 % 1 application  1 application Nasal BID Newman Pies, MD   1 application at [id number]   OLANZapine (ZYPREXA) tablet 2.5 mg  2.5 mg Oral QHS Bergman, Meghan D, NP   2.5 mg at 12/04/20 2245   ondansetron (ZOFRAN)  tablet 4 mg  4 mg Oral Q6H PRN Viona Gilmore D, NP       Or   ondansetron (ZOFRAN) injection 4 mg  4 mg Intravenous Q6H PRN Bergman, Meghan D, NP       pantoprazole (PROTONIX) EC tablet 40 mg  40 mg Oral Daily Bergman, Meghan D, NP   40 mg at 12/05/20 1050   polyethylene glycol (MIRALAX / GLYCOLAX) packet 17 g  17 g Oral Daily PRN Viona Gilmore D, NP       QUEtiapine (SEROQUEL) tablet 200 mg  200 mg Oral QHS Bergman, Meghan D, NP   200 mg at 12/04/20 2245   sodium  phosphate (FLEET) 7-19 GM/118ML enema 1 enema  1 enema Rectal Once PRN Bergman, Meghan D, NP       spironolactone (ALDACTONE) tablet 25 mg  25 mg Oral Daily Bergman, Meghan D, NP   25 mg at 12/05/20 1050   thiamine tablet 100 mg  100 mg Oral Daily Bergman, Meghan D, NP   100 mg at 12/05/20 1050     Discharge Medications: Please see discharge summary for a list of discharge medications.  Relevant Imaging Results:  Relevant Lab Results:   Additional Information SS#: [id number]  [name], RN

## 2020-12-05 NOTE — Plan of Care (Signed)
  Problem: Safety: Goal: Non-violent Restraint(s) Outcome: Progressing   Problem: Education: Goal: Knowledge of General Education information will improve Description: Including pain rating scale, medication(s)/side effects and non-pharmacologic comfort measures Outcome: Progressing   Problem: Health Behavior/Discharge Planning: Goal: Ability to manage health-related needs will improve Outcome: Progressing   Problem: Clinical Measurements: Goal: Ability to maintain clinical measurements within normal limits will improve Outcome: Progressing   Problem: Activity: Goal: Risk for activity intolerance will decrease Outcome: Progressing   Problem: Coping: Goal: Level of anxiety will decrease Outcome: Progressing   Problem: Elimination: Goal: Will not experience complications related to bowel motility Outcome: Progressing

## 2020-12-05 NOTE — Progress Notes (Signed)
Patient restraint removed, patient seems more cooperative, still confused time and place, will continue to monitor.

## 2020-12-05 NOTE — Progress Notes (Signed)
Subjective: The patient is alert and pleasant.  She is confused.  Objective: Vital signs in last 24 hours: Temp:  [97.5 F (36.4 C)-98.6 F (37 C)] 97.5 F (36.4 C) (09/06 0400) Pulse Rate:  [84-96] 92 (09/06 0400) Resp:  [14-22] 14 (09/06 0400) BP: (134-159)/(75-110) 135/94 (09/06 0400) SpO2:  [92 %-97 %] 96 % (09/06 0400) Estimated body mass index is 20.28 kg/m as calculated from the following:   Height as of this encounter: '5\' 2"'$  (1.575 m).   Weight as of this encounter: 50.3 kg.   Intake/Output from previous day: 09/05 0701 - 09/06 0700 In: -  Out: 700 [Urine:700] Intake/Output this shift: No intake/output data recorded.  Physical exam the patient is alert and pleasant.  She is moving all 4 extremities well.  She says she flew in last night and thinks she is in [place].  Lab Results: No results for input(s): WBC, HGB, HCT, PLT in the last 72 hours. BMET No results for input(s): NA, K, CL, CO2, GLUCOSE, BUN, CREATININE, CALCIUM in the last 72 hours.  Studies/Results: No results found.  Assessment/Plan: Hospital day #4, Subdural hematoma, subarachnoid hemorrhage the patient's exam is nonfocal.  Her follow-up CT was stable.  Hyponatremia: The patient has a history of this.  Her latest sodium is 130.  I will recheck it.  LOS: 4 days     Ophelia Charter 12/05/2020, 7:45 AM     Patient ID: Kim Blankenship, female   DOB: 1945-07-13, 75 y.o.   MRN: [id number]

## 2020-12-05 NOTE — Progress Notes (Addendum)
Physical Therapy Treatment Patient Details Name: Kim Blankenship MRN: [id number] DOB: 1946/01/20 Today's Date: 12/05/2020    History of Present Illness This 75 y.o. female admitted after being found on floor by her female friend/house mate. CT showed acute Rt parafalcine SDH and scattered SAH overlying the Rt cerebral hemisphere. There was aslo question if she had a focal seizure. PMH includes: A-Fib, recent stroke, COPD, CA, HTN, PRES, s/p Rt THA.    PT Comments    Pt received getting up OOB with bed alarming, agreeable to participation in therapy session after some reorientation to situation/location. Pt able to progress gait distance to household distances with min guard assist and cues for directional navigation. BP elevated post-session, RN notified. Pt performed stair trial but c/o increased dizziness after this so returned to room. Continue to recommend increased 24/7 supervision/physical assist for safety and bed alarm activated at end of session.  Follow Up Recommendations  Supervision/Assistance - 24 hour;Home health PT ([place] with HHPT to come to facility)     Equipment Recommendations  Other (comment);None recommended by PT (will continue to assess; may do better with AD but pt refusing to trial one this date)    Recommendations for Other Services       Precautions / Restrictions Precautions Precautions: Fall Precaution Comments: SBP <160; pt c/o dizziness when standing Restrictions Weight Bearing Restrictions: No    Mobility  Bed Mobility Overal bed mobility: Modified Independent     Transfers Overall transfer level: Needs assistance Equipment used: None Transfers: Sit to/from Stand Sit to Stand: Supervision  Supervision from EOB, min guard upon standing          Ambulation/Gait Ambulation/Gait assistance: Min assist;Min guard  247f (1072fx2 with standing break) Assistive device: None Gait Pattern/deviations: Step-through  pattern;Decreased stride length;Narrow base of support;Drifts right/left;Decreased step length - left;Decreased stance time - left Gait velocity: decreased    min guard for safety, pt tending to drift to L/R sides of hallway and needs directional cues; no overt LOB but unsteady throughout   Stairs  Yes  Stair Assistance: Supervision Stair Management: One rail Right;Alternating pattern;Step to pattern;Forwards    Pt ascended/descended 10 steps of variable heights in PT gym (4" and 10" steps) using R rail, pt needing mod cues for step sequencing/safety but no LOB or buckling. Pt with decreased recall of safe sequencing with stair ascent and needs cueing for each step.   Wheelchair Mobility    Modified Rankin (Stroke Patients Only) Modified Rankin (Stroke Patients Only) Pre-Morbid Rankin Score: Moderate disability Modified Rankin: Moderately severe disability     Balance Overall balance assessment: Needs assistance Sitting-balance support: No upper extremity supported Sitting balance-Leahy Scale: Good     Standing balance support: No upper extremity supported;During functional activity Standing balance-Leahy Scale: Poor Standing balance comment: pt with unsteady gait, tends to drift to L/R and needing up to min guard for balance with no AD; c/o dizziness          Cognition Arousal/Alertness: Awake/alert Behavior During Therapy: WFL for tasks assessed/performed;Flat affect Overall Cognitive Status: Impaired/Different from baseline Area of Impairment: Orientation;Attention;Memory;Safety/judgement;Problem solving                 Orientation Level: Disoriented to;Place;Time;Situation Current Attention Level: Sustained Memory: Decreased short-term memory;Decreased recall of precautions   Safety/Judgement: Decreased awareness of safety;Decreased awareness of deficits   Problem Solving: Slow processing;Requires verbal cues General Comments: Patient oriented to self. STM  deficits noted. Unable to recall events leading  up to hospitalization, at times she thinks she is at a hotel. Pt initially irritable and wanting to go home but with reorientation to situation/head injury pt agreeable to participate in PT session.      Exercises      General Comments General comments (skin integrity, edema, etc.): BP 176/90 (112) post-exertion in supine, HR 97-103 bpm, SpO2 92-96% with mobility on RA; RN aware of pt wheezing, pt using PRN inhaler at end of session (RN aware)      Pertinent Vitals/Pain Pain Assessment: Faces Faces Pain Scale: Hurts little more Pain Location: headache Pain Descriptors / Indicators: Grimacing;Headache Pain Intervention(s): Limited activity within patient's tolerance;Monitored during session;Repositioned;Patient requesting pain meds-RN notified           PT Goals (current goals can now be found in the care plan section) Acute Rehab PT Goals Patient Stated Goal: to go home PT Goal Formulation: Patient unable to participate in goal setting Time For Goal Achievement: 12/16/20 Progress towards PT goals: Progressing toward goals    Frequency    Min 4X/week      PT Plan Current plan remains appropriate    Co-evaluation PT/OT/SLP Co-Evaluation/Treatment: Yes            AM-PAC PT "6 Clicks" Mobility   Outcome Measure  Help needed turning from your back to your side while in a flat bed without using bedrails?: None Help needed moving from lying on your back to sitting on the side of a flat bed without using bedrails?: None Help needed moving to and from a bed to a chair (including a wheelchair)?: A Little Help needed standing up from a chair using your arms (e.g., wheelchair or bedside chair)?: A Little Help needed to walk in hospital room?: A Little Help needed climbing 3-5 steps with a railing? : A Little 6 Click Score: 20    End of Session Equipment Utilized During Treatment: Gait belt Activity Tolerance: Patient  tolerated treatment well;Other (comment) (c/o dizziness throughout) Patient left: with call bell/phone within reach;in bed;with bed alarm set Nurse Communication: Mobility status;Other (comment) (pt with continued poor STM and bed alarm on moderate sensitivity for safety; pt grabbed inhaler in room wanting to use it (OK per RN)) PT Visit Diagnosis: Unsteadiness on feet (R26.81);Muscle weakness (generalized) (M62.81);History of falling (Z91.81)     Time: [id number] PT Time Calculation (min) (ACUTE ONLY): 20 min  Charges:  $Gait Training: 8-22 mins                     Anzlee Hinesley P., PTA Acute Rehabilitation Services Pager: [phone] Office: [place] 12/05/2020, 1:23 PM

## 2020-12-06 MED ORDER — TUBERCULIN PPD 5 UNIT/0.1ML ID SOLN
5.0000 [IU] | Freq: Once | INTRADERMAL | Status: AC
  Administered 2020-12-06: 5 [IU] via INTRADERMAL
  Filled 2020-12-06: qty 0.1

## 2020-12-06 NOTE — Plan of Care (Signed)

## 2020-12-06 NOTE — Plan of Care (Signed)
  Problem: Education: Goal: Knowledge of General Education information will improve Description: Including pain rating scale, medication(s)/side effects and non-pharmacologic comfort measures Outcome: Progressing   Problem: Health Behavior/Discharge Planning: Goal: Ability to manage health-related needs will improve Outcome: Progressing   Problem: Clinical Measurements: Goal: Ability to maintain clinical measurements within normal limits will improve Outcome: Progressing Goal: Will remain free from infection Outcome: Progressing Goal: Diagnostic test results will improve Outcome: Progressing Goal: Respiratory complications will improve Outcome: Progressing   Problem: Activity: Goal: Risk for activity intolerance will decrease Outcome: Progressing   Problem: Nutrition: Goal: Adequate nutrition will be maintained Outcome: Progressing   Problem: Coping: Goal: Level of anxiety will decrease Outcome: Progressing   Problem: Pain Managment: Goal: General experience of comfort will improve Outcome: Progressing

## 2020-12-06 NOTE — Progress Notes (Signed)
   Providing Compassionate, Quality Care - Together   Subjective: Patient is on the phone with her sister at the time of this assessment. Per the nursing staff, the patient is oriented, but does continue to have intermittent episodes of confusion. There have been no more aggressive episodes. Patient appears to be improving overall.  Objective: Vital signs in last 24 hours: Temp:  [97.8 F (36.6 C)-98.9 F (37.2 C)] 98.5 F (36.9 C) (09/07 1539) Pulse Rate:  [85-106] 85 (09/07 1539) Resp:  [16-19] 18 (09/07 1539) BP: (126-156)/(70-89) 142/88 (09/07 1539) SpO2:  [91 %-97 %] 92 % (09/07 1539)  Intake/Output from previous day: 09/06 0701 - 09/07 0700 In: 240 [P.O.:240] Out: -  Intake/Output this shift: No intake/output data recorded.  Alert and oriented x 3 PERRLA Speech clear, fluent CN II-XII grossly intact MAE, Strength and sensation intact   Lab Results: No results for input(s): WBC, HGB, HCT, PLT in the last 72 hours. BMET Recent Labs    12/05/20 0836  NA 135  K 3.6  CL 98  CO2 28  GLUCOSE 122*  BUN 7*  CREATININE 0.82  CALCIUM 9.5    Studies/Results: No results found.  Assessment/Plan: Patient is five days status post fall where she sustained a parafalcine subdural hematoma and scattered SAH. Follow up imaging showed resolution of her SAH and her SDH was smaller.   LOS: 5 days   -Plan is for discharge to ALF; TB skin test ordered   Viona Gilmore, DNP, AGNP-C Nurse Practitioner  [facility] [address], [address], [place], [place] [address] P: [phone]    F: [phone]  12/06/2020, 3:47 PM

## 2020-12-06 NOTE — Progress Notes (Signed)
Physical Therapy Treatment Patient Details Name: Kim Blankenship MRN: [id number] DOB: 1945-12-11 Today's Date: 12/06/2020    History of Present Illness This 75 y.o. female admitted after being found on floor by her female friend/house mate. CT showed acute Rt parafalcine SDH and scattered SAH overlying the Rt cerebral hemisphere. There was aslo question if she had a focal seizure. PMH includes: A-Fib, recent stroke, COPD, CA, HTN, PRES, s/p Rt THA.    PT Comments    Pt received in supine, oriented x2 and drowsy this date, but agreeable to therapy session with encouragement. Pt orthostatics taken and BP stable, but continues to c/o dizziness and subjective leg weakness, she would benefit from vestibular assessment next session as her cognition is improving and pt more participatory. Emphasis on safe use of RW (improved balance using assistive device this date), activity pacing, and importance of continued mobility. Pt continues to benefit from PT services to progress toward functional mobility goals.   Orthostatic BPs  Supine 141/83  HR 96  Sitting 130/79  HR 102  Standing 123/73  HR 113  Standing after 3 min 130/91  HR 122     Follow Up Recommendations  Supervision/Assistance - 24 hour;Home health PT ([place] with HHPT to come to facility)     Equipment Recommendations  Other (comment);None recommended by PT (will continue to assess; may do better with AD but pt refusing to trial one this date)    Recommendations for Other Services       Precautions / Restrictions Precautions Precautions: Fall Precaution Comments: SBP <160; pt c/o dizziness when standing Restrictions Weight Bearing Restrictions: No    Mobility  Bed Mobility Overal bed mobility: Modified Independent       General bed mobility comments: increased time and use of bed features    Transfers Overall transfer level: Needs assistance Equipment used: None Transfers: Sit to/from Stand Sit to  Stand: Supervision         General transfer comment: Supervision from EOB, once standing pt steps to RW for support during orthostatic BP assessment  Ambulation/Gait Ambulation/Gait assistance: Supervision;Min guard Gait Distance (Feet): 200 Feet ((50 ft x2 and 16f with standing breaks)) Assistive device: Rolling walker (2 wheeled) Gait Pattern/deviations: Step-through pattern;Decreased stride length;Decreased step length - left;Decreased stance time - left Gait velocity: decreased   General Gait Details: pt steadier using RW and mostly Supervision, min guard for safety as pt fatigued and c/o dizziness throughout but no overt LOB and BP stable; tachy to 120's bpm with exertion   Stairs         General stair comments: pt defers today due to fatigue   Wheelchair Mobility    Modified Rankin (Stroke Patients Only) Modified Rankin (Stroke Patients Only) Pre-Morbid Rankin Score: Moderate disability Modified Rankin: Moderately severe disability     Balance Overall balance assessment: Needs assistance Sitting-balance support: No upper extremity supported Sitting balance-Leahy Scale: Good     Standing balance support: No upper extremity supported;During functional activity Standing balance-Leahy Scale: Poor Standing balance comment: pt steadier with use of RW, supervision mostly, min guard when unsupported                    Cognition Arousal/Alertness: Awake/alert Behavior During Therapy: WFL for tasks assessed/performed;Flat affect Overall Cognitive Status: Impaired/Different from baseline Area of Impairment: Orientation;Attention;Memory;Safety/judgement;Problem solving                 Orientation Level: Disoriented to;Place;Time;Situation Current Attention Level: Sustained Memory: Decreased  short-term memory;Decreased recall of precautions   Safety/Judgement: Decreased awareness of safety;Decreased awareness of deficits   Problem Solving: Slow  processing;Requires verbal cues General Comments: Patient oriented to self and [facility] today. STM deficits noted and pt more fatigued/drowsy this date, self-limiting activity due to fatigue, less alert than previous date although self-reports that she slept well.         General Comments General comments (skin integrity, edema, etc.): see orthostatics above; c/o dizziness throughout, may benefit from vestibular assessment next session if able to participate, pt more oriented today and hopefully will be able to tolerate      Pertinent Vitals/Pain Pain Assessment: Faces Faces Pain Scale: Hurts a little bit Pain Location: headache Pain Descriptors / Indicators: Grimacing;Headache Pain Intervention(s): Limited activity within patient's tolerance;Monitored during session;Repositioned     PT Goals (current goals can now be found in the care plan section) Acute Rehab PT Goals Patient Stated Goal: to go home PT Goal Formulation: Patient unable to participate in goal setting Time For Goal Achievement: 12/16/20 Progress towards PT goals: Progressing toward goals    Frequency    Min 4X/week      PT Plan Current plan remains appropriate    Co-evaluation              AM-PAC PT "6 Clicks" Mobility   Outcome Measure  Help needed turning from your back to your side while in a flat bed without using bedrails?: None Help needed moving from lying on your back to sitting on the side of a flat bed without using bedrails?: None Help needed moving to and from a bed to a chair (including a wheelchair)?: A Little Help needed standing up from a chair using your arms (e.g., wheelchair or bedside chair)?: A Little Help needed to walk in hospital room?: A Little Help needed climbing 3-5 steps with a railing? : A Lot 6 Click Score: 19    End of Session Equipment Utilized During Treatment: Gait belt Activity Tolerance: Patient tolerated treatment well;Other (comment) (c/o  dizziness throughout) Patient left: with call bell/phone within reach;in bed;with bed alarm set Nurse Communication: Mobility status;Other (comment) (pt with continued STM deficits and bed alarm on moderate sensitivity for safety; tachy with exertion) PT Visit Diagnosis: Unsteadiness on feet (R26.81);Muscle weakness (generalized) (M62.81);History of falling (Z91.81)     Time: [id number] PT Time Calculation (min) (ACUTE ONLY): 28 min  Charges:  $Gait Training: 8-22 mins $Therapeutic Activity: 8-22 mins                     Reverie Vaquera P., PTA Acute Rehabilitation Services Pager: [phone] Office: [place] 12/06/2020, 1:20 PM

## 2020-12-06 NOTE — TOC Progression Note (Signed)
Transition of Care [facility]) - Progression Note    Patient Details  Name: Kim Blankenship MRN: [id number] Date of Birth: [birth year]  Transition of Care [facility] CM/SW Contact  Kim Friar, RN Phone Number: 12/06/2020, 2:35 PM  Clinical Narrative:    CM has spoken to [facility] in [place] and they are not able to offer the patient a bed at this time.  CM spoke to [facility] [phone]) in [place] and sent them the requested information. They are going to review the information and reach out to pts brother.  CM is waiting to hear from [facility] in [place]--pts sister is to tour the facility today. They have pts information for review.   Cm spoke to Kim Blankenship today and updated him on the progress. He was also made aware of 2 week respite bed at [facility] that pt could use until they are able to get her a bed in either [place] or [place].    For ALF pt will need a TB test. CM will updated the MD.  TOC following.   Expected Discharge Plan: Long Term Nursing Home Barriers to Discharge: Continued Medical Work up  Expected Discharge Plan and Services Expected Discharge Plan: [place]   Discharge Planning Services: CM Consult Post Acute Care Choice: [place] Living arrangements for the past 2 months: Apartment                                       Social Determinants of Health (SDOH) Interventions    Readmission Risk Interventions No flowsheet data found.

## 2020-12-07 NOTE — Progress Notes (Signed)
Physical Therapy Treatment Patient Details Name: Kim Blankenship MRN: [id number] DOB: 07-22-1945 Today's Date: 12/07/2020    History of Present Illness This 75 y.o. female admitted 12/01/20 after being found on floor by her female friend/house mate. CT showed acute Rt parafalcine SDH and scattered SAH overlying the Rt cerebral hemisphere. There was aslo question if she had a focal seizure. PMH includes: A-Fib, recent stroke, COPD, CA, HTN, PRES, s/p Rt THA.    PT Comments    Pt was able to walk with min guard hand held assist around the unit. She has a mildly unsteady gait pattern, reports dizziness and HA during gait.  Vestibular assessment was benign with no signs of vestibular pathology contributing to her reports of dizziness.  In previous therapy session, orthostatics were negative. She remains appropriate for acute level therapies at discharge.  Follow Up Recommendations  Home health PT;Other (comment) (at ALF)     Equipment Recommendations       Recommendations for Other Services       Precautions / Restrictions Precautions Precautions: Fall Precaution Comments: SBP <160; pt c/o dizziness when standing Restrictions Weight Bearing Restrictions: No    Mobility  Bed Mobility Overal bed mobility: Modified Independent                  Transfers Overall transfer level: Needs assistance Equipment used: None Transfers: Sit to/from Stand Sit to Stand: Min guard         General transfer comment: min guard assist for safety and awareness of lines  Ambulation/Gait Ambulation/Gait assistance: Min guard Gait Distance (Feet): 150 Feet Assistive device: 1 person hand held assist Gait Pattern/deviations: Step-through pattern;Staggering left;Staggering right     General Gait Details: pt reporting dizziness and HA when up, but unable to describe dizziness, reports it is constant.  Mildly staggering gait   Stairs             Wheelchair Mobility    Modified  Rankin (Stroke Patients Only)       Balance Overall balance assessment: Mild deficits observed, not formally tested                                          Cognition Arousal/Alertness: Awake/alert Behavior During Therapy: WFL for tasks assessed/performed Overall Cognitive Status: History of cognitive impairments - at baseline                                        Exercises      General Comments General comments (skin integrity, edema, etc.): Pt with 3/4 DOE with gait, O2 sats 94% on RA, HR in the 70s.      Pertinent Vitals/Pain Pain Assessment: Faces Faces Pain Scale: Hurts a little bit Pain Location: headache Pain Descriptors / Indicators: Headache Pain Intervention(s): Limited activity within patient's tolerance;Monitored during session;Repositioned    Home Living                      Prior Function            PT Goals (current goals can now be found in the care plan section) Progress towards PT goals: Progressing toward goals    Frequency    Min 4X/week      PT Plan Current plan remains appropriate  Co-evaluation              AM-PAC PT "6 Clicks" Mobility   Outcome Measure  Help needed turning from your back to your side while in a flat bed without using bedrails?: None Help needed moving from lying on your back to sitting on the side of a flat bed without using bedrails?: None Help needed moving to and from a bed to a chair (including a wheelchair)?: A Little Help needed standing up from a chair using your arms (e.g., wheelchair or bedside chair)?: A Little Help needed to walk in hospital room?: A Little Help needed climbing 3-5 steps with a railing? : A Little 6 Click Score: 20    End of Session Equipment Utilized During Treatment: Gait belt Activity Tolerance: Patient limited by fatigue (limited by DOE) Patient left: in bed;with call bell/phone within reach;with bed alarm set;with nursing/sitter  in room Designer, television/film set)   PT Visit Diagnosis: Unsteadiness on feet (R26.81);Muscle weakness (generalized) (M62.81);History of falling (Z91.81)     Time: [id number] PT Time Calculation (min) (ACUTE ONLY): 18 min  Charges:     1 gait

## 2020-12-07 NOTE — TOC Progression Note (Signed)
Transition of Care [facility]) - Progression Note    Patient Details  Name: Kim Blankenship MRN: [id number] Date of Birth: [birth year]  Transition of Care [facility]) CM/SW Contact  [name], RN Phone Number: 12/07/2020, 4:01 PM  Clinical Narrative:    CM spoke to patients brother over the phone and he states that the plan will be for [facility] in [place] in a couple of weeks. CM asked him to make sure they will still have a bed for her at that time. In the interim CM asked [name] (brother) to call [facility] in [place] to see about a respite bed. [name] talked to [facility] and CM called to see about when pt may get a bed. Admissions at [facility] states they need to get the details worked out and they should have a bed on Monday. Cm has sent message to [facility], NP.  TOC following.   Expected Discharge Plan: Long Term Nursing Home Barriers to Discharge: Continued Medical Work up  Expected Discharge Plan and Services Expected Discharge Plan: [place]   Discharge Planning Services: CM Consult Post Acute Care Choice: [place] Living arrangements for the past 2 months: Apartment                                       Social Determinants of Health (SDOH) Interventions    Readmission Risk Interventions No flowsheet data found.

## 2020-12-07 NOTE — Progress Notes (Signed)
Pt got OOB and left room walking in hallway. Bed alarm activated, staff alerted, and noted that pt appeared to head toward stairwell exit. Pt could not be located on unit, security and administration alerted. Multiple sites searched. Pt was discovered hiding in room 15, behind door by staff. Returned to room without incident and sitter ordered and now present. Arnetha Massy appraised of incident.

## 2020-12-07 NOTE — Progress Notes (Signed)
Occupational Therapy Treatment Patient Details Name: Kim Blankenship MRN: [id number] DOB: [birth year] Today's Date: 12/07/2020    History of present illness This 75 y.o. female admitted after being found on floor by her female friend/house mate. CT showed acute Rt parafalcine SDH and scattered SAH overlying the Rt cerebral hemisphere. There was aslo question if she had a focal seizure. PMH includes: A-Fib, recent stroke, COPD, CA, HTN, PRES, s/p Rt THA.   OT comments  Pt with good participation throughout session, seen with sitter and nursing student in room. Agreeable to brief functional assessment, with poor outcomes on Clock draw assessment with errors on initiation/recall, spatial alignment and overall orientation and score of 13/28 on SBT indicating moderate concern for cognitive deficits. Pt with overall appropriate behaviors throughout session, reporting increased pain with transition to sitting/standing and reports of dizziness but BP stable throughout. Pt demo's ability to complete seated grooming/self feeding with SBA. Would recommend at this time consideration of SNF or ALF to ensure safety with ADL's and mobility d/t impairments in insight and safety. OT will continue to follow    Follow Up Recommendations  Supervision/Assistance - 24 hour;Home health OT    Equipment Recommendations  3 in 1 bedside commode    Recommendations for Other Services      Precautions / Restrictions Precautions Precautions: Fall Precaution Comments: SBP <160; pt c/o dizziness when standing Restrictions Weight Bearing Restrictions: No       Mobility Bed Mobility Overal bed mobility: Modified Independent             General bed mobility comments: increased time and use of bed features    Transfers Overall transfer level: Needs assistance Equipment used: None Transfers: Sit to/from Stand Sit to Stand: Min guard              Balance Overall balance assessment: Needs assistance                                          ADL either performed or assessed with clinical judgement   ADL                           Toilet Transfer: Minimal assistance;Ambulation;Cueing for safety;Cueing for sequencing (increased A d/t reports of dizziness/discomfort. simulated in room)           Functional mobility during ADLs: Min guard;Cueing for safety General ADL Comments: Min guard provided d/c persistent reports of dizziness and HA with transition to standing despite vitals being stable.     Vision       Perception     Praxis      Cognition Arousal/Alertness: Awake/alert Behavior During Therapy: WFL for tasks assessed/performed;Flat affect Overall Cognitive Status: Impaired/Different from baseline Area of Impairment: Orientation;Attention;Memory;Safety/judgement;Problem solving                 Orientation Level: Disoriented to;Place;Time;Situation Current Attention Level: Sustained Memory: Decreased short-term memory;Decreased recall of precautions         General Comments: Tolerated completing Clock draw assessment and SBT. aware she is int he hospital but appears without insight to situation or plans. Noted pt with ''wandering off unit this AM. was found in another patients room.        Exercises     Shoulder Instructions       General Comments  Pertinent Vitals/ Pain       Pain Assessment: 0-10 Pain Score: 9  Pain Location: headache and low back pain Pain Descriptors / Indicators: Grimacing;Headache Pain Intervention(s): Limited activity within patient's tolerance  Home Living                                          Prior Functioning/Environment              Frequency  Min 2X/week        Progress Toward Goals  OT Goals(current goals can now be found in the care plan section)     Acute Rehab OT Goals Patient Stated Goal: to leave soon Time For Goal Achievement:  12/18/20 Potential to Achieve Goals: Good  Plan Discharge plan remains appropriate    Co-evaluation                 AM-PAC OT "6 Clicks" Daily Activity     Outcome Measure   Help from another person eating meals?: None Help from another person taking care of personal grooming?: A Little Help from another person toileting, which includes using toliet, bedpan, or urinal?: A Little Help from another person bathing (including washing, rinsing, drying)?: A Little Help from another person to put on and taking off regular upper body clothing?: A Little Help from another person to put on and taking off regular lower body clothing?: A Little 6 Click Score: 19    End of Session    OT Visit Diagnosis: Unsteadiness on feet (R26.81);Dizziness and giddiness (R42)   Activity Tolerance Patient tolerated treatment well   Patient Left in bed;with call bell/phone within reach;with nursing/sitter in room;with bed alarm set   Nurse Communication Mobility status        Time: [id number] OT Time Calculation (min): 26 min  Charges: OT General Charges $OT Visit: 1 Visit OT Treatments $Self Care/Home Management : 23-37 mins  Tiyona Desouza OTR/L acute rehab services Office: [phone]  12/07/2020, 11:10 AM

## 2020-12-07 NOTE — Progress Notes (Signed)
   Providing Compassionate, Quality Care - Together   Subjective: Nurse reports patient with intermittent confusion. She wandered through the unit this morning. Sitter is at bedside now.  Objective: Vital signs in last 24 hours: Temp:  [98 F (36.7 C)-98.9 F (37.2 C)] 98.4 F (36.9 C) (09/08 0755) Pulse Rate:  [85-104] 104 (09/08 0755) Resp:  [16-18] 18 (09/08 0755) BP: (126-160)/(79-89) 157/89 (09/08 0755) SpO2:  [91 %-93 %] 91 % (09/08 0755)  Intake/Output from previous day: 09/07 0701 - 09/08 0700 In: 250 [P.O.:250] Out: -  Intake/Output this shift: No intake/output data recorded.  Oriented to person, place, and time; disoriented to situation PERRLA Speech clear, fluent CN II-XII grossly intact MAE, Strength and sensation intact  Lab Results: No results for input(s): WBC, HGB, HCT, PLT in the last 72 hours. BMET Recent Labs    12/05/20 0836  NA 135  K 3.6  CL 98  CO2 28  GLUCOSE 122*  BUN 7*  CREATININE 0.82  CALCIUM 9.5    Studies/Results: No results found.  Assessment/Plan: Patient is six days status post fall where she sustained a parafalcine subdural hematoma and scattered SAH. Follow up imaging showed resolution of her SAH and her SDH was smaller.   LOS: 6 days   -Plan is for discharge to ALF. Awaiting placement.   Viona Gilmore, DNP, AGNP-C Nurse Practitioner  [facility] [place] [address], [address], [place], [place] [address] P: [phone]    F: [phone]  12/07/2020, 11:02 AM

## 2020-12-08 LAB — SARS CORONAVIRUS 2 (TAT 6-24 HRS): SARS Coronavirus 2: NEGATIVE

## 2020-12-08 NOTE — TOC Progression Note (Signed)
Transition of Care [facility]) - Progression Note    Patient Details  Name: Ryilee Hisel MRN: [id number] Date of Birth: 08-02-1945  [facility]) CM/SW Contact  [name], RN Phone Number: 12/08/2020, 11:21 AM  Clinical Narrative:    Information required from [facility] completed and faxed. [name] has talked to [place] at [facility] and provided them with information needed.  Plan is for d/c to the facility on Monday.    Expected Discharge Plan: Long Term Nursing Home Barriers to Discharge: Continued Medical Work up  Expected Discharge Plan and Services Expected Discharge Plan: [facility]   Discharge Planning Services: CM Consult Post Acute Care Choice: [place] Living arrangements for the past 2 months: Apartment                                       Social Determinants of Health (SDOH) Interventions    Readmission Risk Interventions No flowsheet data found.

## 2020-12-08 NOTE — Progress Notes (Signed)
Was given her bedtime meds because she became very combative and belligerent and was threatening to spit on staff and hit the staff.  Was trying to leave the room and said is going home.  Had to be reminded several times that she is in the hospital and that she needs to stay unit the doctors feel she is safe to discharge.  She became very SOB, after yelling.  She is calm and resting at this time, since she received her medications.

## 2020-12-08 NOTE — Progress Notes (Signed)
   Providing Compassionate, Quality Care - Together   Subjective: Patient reports headache this morning. Poor short term memory.  Objective: Vital signs in last 24 hours: Temp:  [97.6 F (36.4 C)-99.2 F (37.3 C)] 99.2 F (37.3 C) (09/09 1027) Pulse Rate:  [54-109] 109 (09/09 1027) Resp:  [16-18] 16 (09/09 1027) BP: (142-181)/(68-120) 161/89 (09/09 1027) SpO2:  [90 %-95 %] 95 % (09/09 1043)  Intake/Output from previous day: 09/08 0701 - 09/09 0700 In: 300 [P.O.:300] Out: -  Intake/Output this shift: Total I/O In: 120 [P.O.:120] Out: -   Oriented to person, place, and time; disoriented to situation PERRLA Speech clear, fluent CN II-XII grossly intact MAE, Strength and sensation intact  Lab Results: No results for input(s): WBC, HGB, HCT, PLT in the last 72 hours. BMET No results for input(s): NA, K, CL, CO2, GLUCOSE, BUN, CREATININE, CALCIUM in the last 72 hours.  Studies/Results: No results found.  Assessment/Plan: Patient is seven days status post fall where she sustained a parafalcine subdural hematoma and scattered SAH. Follow up imaging showed resolution of her SAH and her SDH was smaller. Patient has poor short-term memory.   LOS: 7 days   -Plan is for discharge to ALF. Awaiting placement.   Viona Gilmore, DNP, AGNP-C Nurse Practitioner  [facility] [place] [address], [place], [place] [address] P: [phone]    F: [phone]  12/08/2020, 10:48 AM

## 2020-12-08 NOTE — Plan of Care (Signed)

## 2020-12-09 MED ORDER — ENSURE ENLIVE PO LIQD
237.0000 mL | Freq: Three times a day (TID) | ORAL | Status: DC
  Administered 2020-12-09 – 2020-12-13 (×9): 237 mL via ORAL

## 2020-12-09 MED ORDER — CODEINE SULFATE 15 MG PO TABS
15.0000 mg | ORAL_TABLET | Freq: Four times a day (QID) | ORAL | Status: DC | PRN
  Administered 2020-12-09: 15 mg via ORAL
  Filled 2020-12-09: qty 1

## 2020-12-09 MED ORDER — HEPARIN SODIUM (PORCINE) 5000 UNIT/ML IJ SOLN
5000.0000 [IU] | Freq: Three times a day (TID) | INTRAMUSCULAR | Status: DC
  Administered 2020-12-09 – 2020-12-13 (×12): 5000 [IU] via SUBCUTANEOUS
  Filled 2020-12-09 (×13): qty 1

## 2020-12-09 NOTE — Progress Notes (Signed)
Neurosurgery Service Progress Note  Subjective: No acute events overnight, complained to nursing earlier this morning about a productive cough and low back pain worsened by the cough. On my exam this morning, pt does recall having a cough this morning, but it has since resolved, denies low back pain, no CP / SOB  Objective: Vitals:   12/08/20 1707 12/08/20 1936 12/09/20 0024 12/09/20 0831  BP: (!) 166/77 (!) 152/86 (!) 144/84 140/81  Pulse: (!) 49 (!) 105 (!) 105 (!) 104  Resp: '16 19 20 20  '$ Temp: 98.7 F (37.1 C) 99.3 F (37.4 C) 98.4 F (36.9 C) 99.2 F (37.3 C)  TempSrc: Oral Oral Oral   SpO2: 90% 93% 92% 92%  Weight:      Height:        Physical Exam: AOx3, PERRL, gaze conjugate, Strength 5/5 x4, SILTx4, speech fluent  Assessment & Plan: 75 y.o. woman s/p fall w/ parafalcine SDH & tSAH.   -Mildly tachy, has been for 90s+ for much of her admission, no CP/SOB/pleuritic pain, no cough for me on rounds, no tachypnea. Will place a prn for low dose codeine prn cough should it recur and is distressing to the patient, otherwise no indication for treatment, '15mg'$  of codeine to try to minimize effect on mental status / sedation given highly variable absorption dynamics -start SQH -cont keppra -SCDs/TEDs -SNF discharge pending  Judith Part  12/09/20 12:09 PM

## 2020-12-09 NOTE — Plan of Care (Signed)

## 2020-12-09 NOTE — Progress Notes (Signed)
Paged on call Neuro surgeon following this patient. Patient had complaints of worsening pain and a congested productive cough. Provider discontinued call before a full report was given in regards to patient complaints and current status. Call received at 0903. As of 1000 no orders or notes were placed.

## 2020-12-09 NOTE — Plan of Care (Signed)

## 2020-12-09 NOTE — Progress Notes (Signed)
Initial Nutrition Assessment  DOCUMENTATION CODES:   Not applicable  INTERVENTION:   Ensure Enlive po TID, each supplement provides 350 kcal and 20 grams of protein. MVI with minerals daily.  NUTRITION DIAGNOSIS:   Inadequate oral intake related to decreased appetite, lethargy/confusion as evidenced by meal completion < 50%.  GOAL:   Patient will meet greater than or equal to 90% of their needs  MONITOR:   PO intake, Supplement acceptance  REASON FOR ASSESSMENT:   Malnutrition Screening Tool    ASSESSMENT:   75 yo female admitted with parafalcine SDH and scattered SAH S/P fall. PMH includes asthma, COPD, A fib, HTN, Stroke, PRES, cervical cancer.  Unable to obtain any nutrition history from patient or complete nutrition focused physical exam at this time.   Labs reviewed.   Medications reviewed and include Colace, ferrous sulfate, folic acid, Keppra, MVI with minerals, Protonix, spironolactone, thiamine.  Plans for D/C to ALF on Monday.  Currently on a regular diet. Meal intakes: 0-50% (average 32% since admission)  Admission weight 50.3 kg (same as previous 2 recordings on 10/29/20 and 10/19/20.  Need to obtain current weight for accurate nutrition assessment.  NUTRITION - FOCUSED PHYSICAL EXAM:  Unable to complete  Diet Order:   Diet Order             Diet regular Room service appropriate? Yes with Assist; Fluid consistency: Thin  Diet effective now                   EDUCATION NEEDS:   Not appropriate for education at this time  Skin:  Skin Assessment: Reviewed RN Assessment  Last BM:  9/9  Height:   Ht Readings from Last 1 Encounters:  12/01/20 '5\' 2"'$  (1.575 m)    Weight:   Wt Readings from Last 1 Encounters:  12/01/20 50.3 kg    BMI:  Body mass index is 20.28 kg/m.  Estimated Nutritional Needs:   Kcal:  1500-1700  Protein:  70-80 gm  Fluid:  >/= 1.5 L    Lucas Mallow, RD, LDN, CNSC Please refer to Amion for contact  information.

## 2020-12-10 LAB — RESP PANEL BY RT-PCR (FLU A&B, COVID) ARPGX2
Influenza A by PCR: NEGATIVE
Influenza B by PCR: NEGATIVE
SARS Coronavirus 2 by RT PCR: NEGATIVE

## 2020-12-10 MED ORDER — HALOPERIDOL 0.5 MG PO TABS: 1.0000 mg | ORAL_TABLET | Freq: Four times a day (QID) | ORAL | Status: DC | PRN

## 2020-12-10 MED ORDER — HALOPERIDOL LACTATE 5 MG/ML IJ SOLN
1.0000 mg | Freq: Four times a day (QID) | INTRAMUSCULAR | Status: DC | PRN
  Administered 2020-12-11: 1 mg via INTRAMUSCULAR
  Filled 2020-12-10: qty 1

## 2020-12-10 NOTE — Progress Notes (Signed)
Neurosurgery Service Progress Note  Subjective: No acute events overnight, no coughing when I was rounding, no complaints this morning  Objective: Vitals:   12/09/20 1654 12/09/20 2024 12/10/20 0532 12/10/20 0733  BP: 123/83 (!) 155/53 127/79 126/78  Pulse: 99 (!) 58 100 95  Resp: '20 16 16 18  '$ Temp: 98.8 F (37.1 C) 98.1 F (36.7 C) 98 F (36.7 C) 98.2 F (36.8 C)  TempSrc: Oral Oral Oral Axillary  SpO2: 94% 94% 91% 95%  Weight:      Height:        Physical Exam: AOx2, PERRL, gaze conjugate, Strength 5/5 x4, SILTx4, speech fluent  Assessment & Plan: 75 y.o. woman s/p fall w/ parafalcine SDH & tSAH.   -cont keppra -SCDs/TEDs -SNF discharge pending  Judith Part  12/10/20 10:21 AM

## 2020-12-10 NOTE — Progress Notes (Signed)
Pt became very agitated, yelling, cursing and threatening to hit staff, refused redirection, NP Bergman paged and notified, orders received and initiated, will however continue to monitor. Ob[place]-Asidi, Limuel Nieblas Efe

## 2020-12-10 NOTE — Plan of Care (Signed)

## 2020-12-11 ENCOUNTER — Inpatient Hospital Stay (HOSPITAL_COMMUNITY): Payer: Medicare Other

## 2020-12-11 DIAGNOSIS — R410 Disorientation, unspecified: Secondary | ICD-10-CM

## 2020-12-11 MED ORDER — QUETIAPINE FUMARATE 50 MG PO TABS
50.0000 mg | ORAL_TABLET | Freq: Every day | ORAL | Status: DC
  Administered 2020-12-12 – 2020-12-13 (×2): 50 mg via ORAL
  Filled 2020-12-11 (×2): qty 1

## 2020-12-11 MED ORDER — QUETIAPINE FUMARATE 25 MG PO TABS: 25.0000 mg | ORAL_TABLET | Freq: Every day | ORAL | Status: DC

## 2020-12-11 MED ORDER — HALOPERIDOL 0.5 MG PO TABS
1.0000 mg | ORAL_TABLET | Freq: Four times a day (QID) | ORAL | Status: DC | PRN
  Administered 2020-12-11 – 2020-12-13 (×3): 1 mg via ORAL
  Filled 2020-12-11 (×5): qty 2

## 2020-12-11 MED ORDER — HALOPERIDOL LACTATE 5 MG/ML IJ SOLN: 4.0000 mg | Freq: Four times a day (QID) | INTRAMUSCULAR | Status: DC | PRN

## 2020-12-11 MED ORDER — HALOPERIDOL 0.5 MG PO TABS: 1.0000 mg | ORAL_TABLET | Freq: Four times a day (QID) | ORAL | Status: DC | PRN

## 2020-12-11 MED ORDER — BENZONATATE 100 MG PO CAPS
200.0000 mg | ORAL_CAPSULE | Freq: Three times a day (TID) | ORAL | Status: DC | PRN
  Administered 2020-12-13: 200 mg via ORAL
  Filled 2020-12-11: qty 2

## 2020-12-11 MED ORDER — GUAIFENESIN-DM 100-10 MG/5ML PO SYRP
5.0000 mL | ORAL_SOLUTION | ORAL | Status: DC | PRN
  Administered 2020-12-11 – 2020-12-13 (×3): 5 mL via ORAL
  Filled 2020-12-11 (×3): qty 5

## 2020-12-11 MED ORDER — HYDROCODONE-ACETAMINOPHEN 5-325 MG PO TABS
1.0000 | ORAL_TABLET | Freq: Two times a day (BID) | ORAL | Status: DC | PRN
  Administered 2020-12-12: 2 via ORAL
  Filled 2020-12-11: qty 2

## 2020-12-11 MED ORDER — HALOPERIDOL LACTATE 5 MG/ML IJ SOLN: 5.0000 mg | Freq: Four times a day (QID) | INTRAMUSCULAR | Status: DC | PRN

## 2020-12-11 NOTE — NC FL2 (Signed)
[place] LEVEL OF CARE SCREENING TOOL     IDENTIFICATION  Patient Name: Kim Blankenship Birthdate: [birth year] Sex: female Admission Date (Current Location): 12/01/2020  [facility] and [place] Number:  Herbalist and Address:  [facility] [place] [place] [address], [place], [place] [address]      Provider Number: [id number]  Attending Physician Name and Address:  Newman Pies, MD  Relative Name and Phone Number:       Current Level of Care: Hospital Recommended Level of Care: [place] Prior Approval Number:    Date Approved/Denied:   PASRR Number:    Discharge Plan: SNF    Current Diagnoses: Patient Active Problem List   Diagnosis Date Noted   Subarachnoid hemorrhage ([place]) 12/01/2020   Cerebral embolism with cerebral infarction 10/21/2020   Dizziness 10/19/2020   Unsteady gait 10/19/2020   Mass of upper lobe of left lung 10/19/2020   Leukocytosis 10/19/2020   Stroke-like symptoms    Pressure injury of skin 10/12/2020   COPD exacerbation ([place]) 10/11/2020   History of stroke [id number]   Acute metabolic encephalopathy [id number]   Acute respiratory failure with hypoxia ([place]) 07/23/2019   Acute encephalopathy 07/22/2019   Overdose opiate, accidental or unintentional, initial encounter ([place]) 07/22/2019   Vulvar cancer ([place]) 07/22/2019   Delirium    GIB (gastrointestinal bleeding) 12/04/2018   Chronic diastolic CHF (congestive heart failure) ([place]) 12/04/2018   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   Stroke (cerebrum) ([place]) 11/21/2016   Hyponatremia 11/20/2016   UTI (urinary tract infection) 11/20/2016   HLD (hyperlipidemia) 11/20/2016   GERD (gastroesophageal reflux disease) 11/20/2016   Depression 11/20/2016   Hypertensive emergency 11/04/2016   EtOH dependence ([place]) 11/04/2016   Hyperglycemia 11/04/2016   Essential hypertension    Hypertensive urgency 10/30/2016    PAF (paroxysmal atrial fibrillation) ([place]) 10/30/2016   PRES (posterior reversible encephalopathy syndrome) 10/30/2016   Seizure ([place])    Acute blood loss anemia    Closed fracture of right hip ([place]) 02/09/2016   Chest pain 11/07/2015   COPD (chronic obstructive pulmonary disease) ([place]) 08/19/2013   Abnormal EKG 08/19/2013   Fever, unspecified 08/19/2013    Orientation RESPIRATION BLADDER Height & Weight     Self, Place  Normal Incontinent Weight: 50.3 kg Height:  '5\' 2"'$  (157.5 cm)  BEHAVIORAL SYMPTOMS/MOOD NEUROLOGICAL BOWEL NUTRITION STATUS      Continent Diet (Regular with thin liquids)  AMBULATORY STATUS COMMUNICATION OF NEEDS Skin   Supervision Verbally Normal                       Personal Care Assistance Level of Assistance  Bathing, Feeding, Dressing Bathing Assistance: Limited assistance Feeding assistance: Independent Dressing Assistance: Independent     Functional Limitations Info  Sight, Speech, Hearing Sight Info: Impaired (wears glasses) Hearing Info: Adequate Speech Info: Adequate    SPECIAL CARE FACTORS FREQUENCY  PT (By licensed PT), OT (By licensed OT)     PT Frequency: 5x/wk OT Frequency: 5x/wk            Contractures Contractures Info: Not present    Additional Factors Info  Code Status, Allergies, Psychotropic Code Status Info: Full Allergies Info: tape/ levofloxacin Psychotropic Info: Buspar 10 mg BID/ Keppra 500 mg BID/ Zyprexa 2.5 mg at bedtime/ Seroquel 200 mg at bedtime         Current Medications (12/11/2020):  This is the current hospital active medication  list Current Facility-Administered Medications  Medication Dose Route Frequency Provider Last Rate Last Admin   0.9 %  sodium chloride infusion   Intravenous PRN Newman Pies, MD   Stopped at 12/02/20 1550   acetaminophen (TYLENOL) tablet 650 mg  650 mg Oral Q6H PRN Viona Gilmore D, NP   650 mg at 12/10/20 1418   Or   acetaminophen (TYLENOL) suppository 650 mg   650 mg Rectal Q6H PRN Viona Gilmore D, NP       albuterol (VENTOLIN HFA) 108 (90 Base) MCG/ACT inhaler 2 puff  2 puff Inhalation Q4H PRN Viona Gilmore D, NP   2 puff at 12/11/20 0640   atorvastatin (LIPITOR) tablet 40 mg  40 mg Oral QHS Viona Gilmore D, NP   40 mg at 12/10/20 2138   bisacodyl (DULCOLAX) suppository 10 mg  10 mg Rectal Daily PRN Viona Gilmore D, NP       busPIRone (BUSPAR) tablet 10 mg  10 mg Oral BID Viona Gilmore D, NP   10 mg at 12/10/20 2138   calcium carbonate (TUMS - dosed in mg elemental calcium) chewable tablet 200 mg of elemental calcium  1 tablet Oral Daily PRN Viona Gilmore D, NP       codeine tablet 15 mg  15 mg Oral Q6H PRN Judith Part, MD   15 mg at 12/09/20 1735   docusate sodium (COLACE) capsule 100 mg  100 mg Oral BID Viona Gilmore D, NP   100 mg at 12/10/20 2138   feeding supplement (ENSURE ENLIVE / ENSURE PLUS) liquid 237 mL  237 mL Oral TID BM Newman Pies, MD   237 mL at 12/10/20 2138   fentaNYL (SUBLIMAZE) injection 12.5-50 mcg  12.5-50 mcg Intravenous Q2H PRN Viona Gilmore D, NP       ferrous sulfate tablet 325 mg  325 mg Oral BID WC Bergman, Meghan D, NP   325 mg at Q000111Q Q000111Q   folic acid (FOLVITE) tablet 1 mg  1 mg Oral Daily Newman Pies, MD   1 mg at 12/10/20 [id number]   haloperidol (HALDOL) tablet 1 mg  1 mg Oral Q6H PRN Viona Gilmore D, NP       Or   haloperidol lactate (HALDOL) injection 1 mg  1 mg Intramuscular Q6H PRN Viona Gilmore D, NP   1 mg at 12/11/20 0000   heparin injection 5,000 Units  5,000 Units Subcutaneous Q8H Judith Part, MD   5,000 Units at 12/11/20 0629   HYDROcodone-acetaminophen (NORCO/VICODIN) 5-325 MG per tablet 1-2 tablet  1-2 tablet Oral Q4H PRN Viona Gilmore D, NP   1 tablet at 12/10/20 2138   labetalol (NORMODYNE) injection 10 mg  10 mg Intravenous Q10 min PRN Viona Gilmore D, NP   10 mg at 12/02/20 1929   levETIRAcetam (KEPPRA) tablet 500 mg  500 mg Oral BID Dawley, Troy C, DO    500 mg at 12/10/20 2138   multivitamin with minerals tablet 1 tablet  1 tablet Oral Daily Newman Pies, MD   1 tablet at 12/10/20 0905   OLANZapine (ZYPREXA) tablet 2.5 mg  2.5 mg Oral QHS Bergman, Meghan D, NP   2.5 mg at 12/10/20 2138   ondansetron (ZOFRAN) tablet 4 mg  4 mg Oral Q6H PRN Viona Gilmore D, NP       Or   ondansetron (ZOFRAN) injection 4 mg  4 mg Intravenous Q6H PRN Bergman, Meghan D, NP       pantoprazole (PROTONIX) EC tablet 40  mg  40 mg Oral Daily Viona Gilmore D, NP   40 mg at 12/10/20 0905   polyethylene glycol (MIRALAX / GLYCOLAX) packet 17 g  17 g Oral Daily PRN Viona Gilmore D, NP       QUEtiapine (SEROQUEL) tablet 200 mg  200 mg Oral QHS Bergman, Meghan D, NP   200 mg at 12/10/20 2138   sodium phosphate (FLEET) 7-19 GM/118ML enema 1 enema  1 enema Rectal Once PRN Viona Gilmore D, NP       spironolactone (ALDACTONE) tablet 25 mg  25 mg Oral Daily Bergman, Meghan D, NP   25 mg at 12/10/20 0905   thiamine tablet 100 mg  100 mg Oral Daily Viona Gilmore D, NP   100 mg at 12/10/20 [id number]     Discharge Medications: Please see discharge summary for a list of discharge medications.  Relevant Imaging Results:  Relevant Lab Results:   Additional Information SS#: [id number]  [name], RN

## 2020-12-11 NOTE — Consult Note (Signed)
[facility] Medical Consultation  Kim Blankenship [id number] DOB: [birth year] DOA: 12/01/2020 PCP: Jolinda Croak, MD   Requesting physician: Neurosurgery Date of consultation: 09/12 Reason for consultation: Agitation  Impression/Recommendations Active Problems:   Subarachnoid hemorrhage ([place])    Delirium, with significant nature of sundowning, suspect this is hospitalization/element of stress related.  On admission, UDS and serum alcohol negative, and patient denied drinking alcohol recently.  According to PCPs note on care everywhere on Aug 02, 2020, Seroquel 200 mg at bedtime started for worsening of her depression.  No formal diagnosis of dementia, but sister [name] who lives in [place] reported that patient appears to have mild dementia/memory loss on phone conversations.  EKG on admission showed QTC 460, I increased IM Haldol to 4 mg every 6 hours for agitation and start daytime Seroquel 50 mg in the morning which can be titrated according to her response.  We will check chest x-ray, UA and CBC.  Repeat EKG tomorrow for QTC stable.  We will change Coumadin to nonnarcotic for cough and wean down Norco as mentioned below. Headache, appears patient been using more frequently the as needed Norco up to 4 times yesterday, will start to cut down Norco to q. 12 HR PRN Question of dementia, no formal diagnosis in the past, will need outpatient PCP follow-up with [facility]. Depression, as mentioned above we will continue 200 mg Seroquel at bedtime, start 50 mg Seroquel in the a.m., which can be titrated according to response. Fall and subdural hematoma and SAH, appears to be stable as per primary team. COPD, stable, no acute issue. PAF, in sinus rhythm, no anticoagulation secondary to frequent falls. Seizure disorder, continue Keppra.  I will followup again tomorrow. Please contact me if I can be of assistance in the meanwhile. Thank you for this consultation.  Chief Complaint: Leave  me alone  HPI:  Patient with history of PAF not on anticoagulation due to frequent falls, recent stroke on dual antiplatelet, HTN, COPD, seizure disorder, depression on at bedtime Seroquel and BuSpar, came with mechanical fall, head injury and subdural hematoma and SAH.  Patient has been under the care of neurosurgery and in last 10 days, SDH and SAH stabilized, however increasingly, patient has had symptoms of frequent confusion and agitation and sundowning.  As time as outpatient, patient denied any sleeping problems, her pain is controlled, cough is mild, no fever chills no urinary problems or diarrhea.  Patient has to be restrained and as needed Haldol was started but with little improvement of her sundowning.  Patient lives by herself, her daughter Aram Beecham lives in [place] who reported that the patient has had symptoms of forgetfulness, which she questioned as to whether she has dementia.  Review of Systems:  14 points of review of systems except as mentioned in HPI.  Past Medical History:  Diagnosis Date   Asthma    Atrial fibrillation ([place])    Cancer ([place])    cervical   COPD (chronic obstructive pulmonary disease) ([place])    Hypertension    Irregular heart rate    PRES (posterior reversible encephalopathy syndrome)    Stroke [facility])    Past Surgical History:  Procedure Laterality Date   CESAREAN SECTION     Left Foot Reconstruction     OPEN REDUCTION INTERNAL FIXATION (ORIF) METACARPAL Right 03/10/2015   Procedure: OPEN REDUCTION INTERNAL FIXATION (ORIF) RIGHT LONG  METACARPAL FRACTURE;  Surgeon: Leanora Cover, MD;  Location: [facility];  Service: Orthopedics;  Laterality: Right;  TONSILLECTOMY     TOTAL HIP ARTHROPLASTY Right 02/09/2016   Procedure: TOTAL HIP ARTHROPLASTY ANTERIOR APPROACH;  Surgeon: Frederik Pear, MD;  Location: [facility];  Service: Orthopedics;  Laterality: Right;   Social History:  reports that she quit smoking about 25 years ago. Her smoking use  included cigarettes. She has a 60.00 pack-year smoking history. She has never used smokeless tobacco. She reports current alcohol use. She reports that she does not use drugs.  Allergies  Allergen Reactions   Levofloxacin     Other reaction(s): Other Leg pain   Tape Other (See Comments)    THE PATIENT'S SKIN IS THIN AND TEARS VERY EASILY (she "picks" at it; please use coban wrap)   Family History  Problem Relation Age of Onset   Dementia Father     Prior to Admission medications   Medication Sig Start Date End Date Taking? Authorizing Provider  amLODipine (NORVASC) 2.5 MG tablet Take 2.5 mg by mouth daily.   Yes [provider]  calcium carbonate (TUMS - DOSED IN MG ELEMENTAL CALCIUM) 500 MG chewable tablet Chew 1 tablet by mouth daily as needed for indigestion or heartburn.   Yes [provider]  chlorpheniramine-HYDROcodone (TUSSIONEX) 10-8 MG/5ML SUER Take 5 mLs by mouth every 12 (twelve) hours as needed for cough.   Yes [provider]  clopidogrel (PLAVIX) 75 MG tablet Take 75 mg by mouth daily.   Yes [provider]  metoprolol tartrate (LOPRESSOR) 50 MG tablet Take 50 mg by mouth 2 (two) times daily.   Yes [provider]  albuterol (VENTOLIN HFA) 108 (90 Base) MCG/ACT inhaler Inhale 2 puffs into the lungs every 4 (four) hours as needed for wheezing or shortness of breath. 09/30/20   Isla Pence, MD  aspirin EC 81 MG tablet Take 1 tablet (81 mg total) by mouth daily. 10/22/20   Thurnell Lose, MD  atorvastatin (LIPITOR) 40 MG tablet Take 1 tablet (40 mg total) by mouth at bedtime. 10/22/20   Thurnell Lose, MD  azithromycin (ZITHROMAX) 500 MG tablet Take 500 mg by mouth daily. 10/26/20   [provider]  carvedilol (COREG) 6.25 MG tablet Take 1 tablet (6.25 mg total) by mouth 2 (two) times daily with a meal. 10/23/20 11/22/20  Thurnell Lose, MD  DULERA 200-5 MCG/ACT AERO Inhale 2 puffs into the lungs in the morning and at  bedtime. 10/26/20   [provider]  ferrous sulfate 325 (65 FE) MG tablet Take 1 tablet (325 mg total) by mouth 2 (two) times daily with a meal. Patient not taking: No sig reported 12/10/18   Modena Jansky, MD  folic acid (FOLVITE) 1 MG tablet Take 1 tablet (1 mg total) by mouth daily. Patient not taking: No sig reported 12/11/18   Modena Jansky, MD  meclizine (ANTIVERT) 25 MG tablet Take 1 tablet (25 mg total) by mouth 3 (three) times daily as needed for dizziness. 10/22/20   Thurnell Lose, MD  Multiple Vitamin (MULTIVITAMIN WITH MINERALS) TABS tablet Take 1 tablet by mouth daily. Patient not taking: No sig reported 12/11/18   Modena Jansky, MD  pantoprazole (PROTONIX) 40 MG tablet Take 1 tablet (40 mg total) by mouth daily. 10/22/20   Thurnell Lose, MD  predniSONE (STERAPRED UNI-PAK 21 TAB) 10 MG (21) TBPK tablet See admin instructions. Take as directed per instructions in box 10/26/20   [provider]  QUEtiapine (SEROQUEL) 100 MG tablet Take 2 tablets (200 mg total)  by mouth at bedtime. 10/22/20   Thurnell Lose, MD  spironolactone (ALDACTONE) 25 MG tablet Take 25 mg by mouth daily.    [provider]  thiamine 100 MG tablet Take 1 tablet (100 mg total) by mouth daily. Patient not taking: No sig reported 12/11/18   Modena Jansky, MD   Physical Exam: Blood pressure (!) 146/71, pulse (!) 55, temperature 99 F (37.2 C), temperature source Oral, resp. rate 14, height '5\' 2"'$  (1.575 m), weight 50.3 kg, SpO2 98 %. Vitals:   12/11/20 1337 12/11/20 1600  BP: 126/74 (!) 146/71  Pulse: 97 (!) 55  Resp: 16 14  Temp: 98.3 F (36.8 C) 99 F (37.2 C)  SpO2: 100% 98%    General: No acute distress Eyes: PERRL ENT: Moist, no rash Neck: Supple no JVD Cardiovascular: RRR, no murmurs Respiratory: Clear breathing, no crackles or wheezing Abdomen: Soft nontender nondistended Skin: No rash no ulcer Musculoskeletal: Normal ROM, no swelling Psychiatric:  Calm Neurologic: Awake alert oriented x3, no focal deficit.  Labs on Admission:  Basic Metabolic Panel: Recent Labs  Lab 12/05/20 0836  NA 135  K 3.6  CL 98  CO2 28  GLUCOSE 122*  BUN 7*  CREATININE 0.82  CALCIUM 9.5   Liver Function Tests: No results for input(s): AST, ALT, ALKPHOS, BILITOT, PROT, ALBUMIN in the last 168 hours. No results for input(s): LIPASE, AMYLASE in the last 168 hours. No results for input(s): AMMONIA in the last 168 hours. CBC: No results for input(s): WBC, NEUTROABS, HGB, HCT, MCV, PLT in the last 168 hours. Cardiac Enzymes: No results for input(s): CKTOTAL, CKMB, CKMBINDEX, TROPONINI in the last 168 hours. BNP: Invalid input(s): POCBNP CBG: No results for input(s): GLUCAP in the last 168 hours.  Radiological Exams on Admission: No results found.  EKG: Sinus rhythm, no ST-T changes  Time spent: 55 minutes  Lequita Halt [facility] Pager [phone]   12/11/2020, 6:53 PM

## 2020-12-11 NOTE — Progress Notes (Signed)
PPD skin test results: No induration noted to right forearm.

## 2020-12-11 NOTE — Progress Notes (Signed)
Physical Therapy Treatment Patient Details Name: Kim Blankenship MRN: [id number] DOB: 1945/04/25 Today's Date: 12/11/2020   History of Present Illness This 75 y.o. female admitted 12/01/20 after being found on floor by her female friend/house mate. CT showed acute Rt parafalcine SDH and scattered SAH overlying the Rt cerebral hemisphere. There was also question if she had a focal seizure. PMH includes: A-Fib, recent stroke, COPD, CA, HTN, PRES, s/p Rt THA.    PT Comments    Pt received up in bathroom with RN present, pt agreeable to limited session and with fair participation and tolerance for gait task in room and transfer training. Pt limited due to pain this date and with notable functional decline, needing increased physical assist and verbal cues for safety with transfers and short distance gait trial in room. Pt needing up to modA for functional mobility tasks this date. Discharge recommendations and frequency updated below per discussion with supervising PT [facility] M.   Recommendations for follow up therapy are one component of a multi-disciplinary discharge planning process, led by the attending physician.  Recommendations may be updated based on patient status, additional functional criteria and insurance authorization.  Follow Up Recommendations  SNF;Supervision/Assistance - 24 hour;Supervision for mobility/OOB     Equipment Recommendations  Rolling walker with 5" wheels    Recommendations for Other Services       Precautions / Restrictions Precautions Precautions: Fall Precaution Comments: SBP <160; pt c/o dizziness when standing Restrictions Weight Bearing Restrictions: No     Mobility  Bed Mobility Overal bed mobility: Needs Assistance Bed Mobility: Sit to Supine       Sit to supine: Min assist   General bed mobility comments: cues for safety, pt needs minA for BLE assist today, more fatigued. pt able to bridge hips minimally for repositioning toward center of bed.     Transfers Overall transfer level: Needs assistance Equipment used: None Transfers: Sit to/from Stand Sit to Stand: Min assist;Mod assist         General transfer comment: min to modA to stand from toilet with R wall rail and LUE HHA/lift assist needed (minA lift assist and modA steadying assist upon standing). MinA for stand>sit to EOB after gait task in room  Ambulation/Gait Ambulation/Gait assistance: Mod assist Gait Distance (Feet): 20 Feet Assistive device: 1 person hand held assist Gait Pattern/deviations: Step-through pattern;Trunk flexed;Narrow base of support;Drifts right/left;Decreased stride length;Antalgic Gait velocity: decreased Gait velocity interpretation: <1.8 ft/sec, indicate of risk for recurrent falls General Gait Details: pt c/o R lower back/hip discomfort upon standing from toilet, mildly antalgic gait to return to bed from toilet (pt received in bathroom, RN had helped her up). Pt needing mostly modA support due to unsteadiness/headache and fatigue today.      Modified Rankin (Stroke Patients Only) Modified Rankin (Stroke Patients Only) Pre-Morbid Rankin Score: Moderate disability Modified Rankin: Moderately severe disability     Balance Overall balance assessment: Needs assistance Sitting-balance support: No upper extremity supported Sitting balance-Leahy Scale: Good Sitting balance - Comments: limited assessment, pt able to bend forward to place briefs/pants on without LOB while seated on toilet   Standing balance support: During functional activity;Single extremity supported Standing balance-Leahy Scale: Poor Standing balance comment: pt needing minA for static standing and modA for dynamic standing with LUE support due to pain/fatigue and postural instability/unsteadiness, pt drifting to L/R while stepping and would have fallen without physical assist  Cognition Arousal/Alertness: Awake/alert Behavior During  Therapy: WFL for tasks assessed/performed Overall Cognitive Status: Impaired/Different from baseline Area of Impairment: Attention;Safety/judgement;Awareness;Problem solving                 Orientation Level:  (oriented to self, not further assessed, pt ignoring some orientation questions) Current Attention Level: Focused Memory: Decreased recall of precautions;Decreased short-term memory   Safety/Judgement: Decreased awareness of safety;Decreased awareness of deficits Awareness: Intellectual Problem Solving: Slow processing;Difficulty sequencing;Requires verbal cues General Comments: pt less alert today, internally distracted due to bladder urgency/headache pain and with limited activity tolerance, needing increased safety cues and noted with restraints order, restraints replaced when back in bed.      Exercises      General Comments General comments (skin integrity, edema, etc.): HR 114 bpm with exertion; SpO2 92% resting on RA (pt received on RA in bathroom) and desat to 88% with exertion, so replaced to 3L O2 [place] once back in bed and RN notified, SpO2 95% on [place]      Pertinent Vitals/Pain Pain Assessment: 0-10 Pain Score: 8  Pain Location: headache (frontal), R sided back pain Pain Descriptors / Indicators: Headache;Grimacing;Guarding Pain Intervention(s): Limited activity within patient's tolerance;Monitored during session;Repositioned;Patient requesting pain meds-RN notified     PT Goals (current goals can now be found in the care plan section) Acute Rehab PT Goals Patient Stated Goal: feel better, no headache PT Goal Formulation: Patient unable to participate in goal setting Time For Goal Achievement: 12/16/20 Potential to Achieve Goals: Fair Progress towards PT goals: Progressing toward goals    Frequency    Min 3X/week      PT Plan Discharge plan needs to be updated;Frequency needs to be updated       AM-PAC PT "6 Clicks" Mobility   Outcome Measure   Help needed turning from your back to your side while in a flat bed without using bedrails?: A Little Help needed moving from lying on your back to sitting on the side of a flat bed without using bedrails?: A Little Help needed moving to and from a bed to a chair (including a wheelchair)?: A Little Help needed standing up from a chair using your arms (e.g., wheelchair or bedside chair)?: A Lot Help needed to walk in hospital room?: A Lot Help needed climbing 3-5 steps with a railing? : A Lot 6 Click Score: 15    End of Session Equipment Utilized During Treatment: Gait belt Activity Tolerance: Patient limited by fatigue;Patient limited by pain (DOE/headache pain) Patient left: in bed;with call bell/phone within reach;with bed alarm set;with restraints reapplied (heels floated but RN notified if she can find another pillow it would be easier to float her heels) Nurse Communication: Mobility status;Patient requests pain meds PT Visit Diagnosis: Unsteadiness on feet (R26.81);Muscle weakness (generalized) (M62.81);History of falling (Z91.81)     Time: [id number] PT Time Calculation (min) (ACUTE ONLY): 19 min  Charges:  $Therapeutic Activity: 8-22 mins                     Damein Gaunce P., PTA Acute Rehabilitation Services Pager: [phone] Office: [place] 12/11/2020, 1:40 PM

## 2020-12-11 NOTE — Progress Notes (Signed)
Re: Kim Blankenship DOB:12-13-1945 Date: 12/11/2020   To Whom It May Concern:  Please be advised that the above-named patient will require a short-term nursing home stay--anticipated 30 days or less for rehabilitation and strengthening. The plan is for home.

## 2020-12-11 NOTE — Progress Notes (Signed)
Subjective: The patient is alert and pleasant.  She is in no apparent distress.  Objective: Vital signs in last 24 hours: Temp:  [97.7 F (36.5 C)-99.1 F (37.3 C)] 97.7 F (36.5 C) (09/12 0434) Pulse Rate:  [59-100] 82 (09/12 0434) Resp:  [17-20] 20 (09/12 0434) BP: (114-165)/(62-86) 114/66 (09/12 0434) SpO2:  [92 %-94 %] 92 % (09/12 0434) Estimated body mass index is 20.28 kg/m as calculated from the following:   Height as of this encounter: '5\' 2"'$  (1.575 m).   Weight as of this encounter: 50.3 kg.   Intake/Output from previous day: 09/11 0701 - 09/12 0700 In: -  Out: 550 [Urine:550] Intake/Output this shift: No intake/output data recorded.  Physical exam the patient is alert and pleasant.  She is oriented x1.  She is conversant but confused.  She is moving all 4 extremities well.  Lab Results: No results for input(s): WBC, HGB, HCT, PLT in the last 72 hours. BMET No results for input(s): NA, K, CL, CO2, GLUCOSE, BUN, CREATININE, CALCIUM in the last 72 hours.  Studies/Results: No results found.  Assessment/Plan: Hospital day #10: The patient is waiting for and is ready for skilled nursing facility placement.  LOS: 10 days     Ophelia Charter 12/11/2020, 8:15 AM     Patient ID: Kim Blankenship, female   DOB: [birth year], 75 y.o.   MRN: [id number]

## 2020-12-11 NOTE — Progress Notes (Signed)
Occupational Therapy Treatment Patient Details Name: Kim Blankenship MRN: [id number] DOB: 07-Mar-1946 Today's Date: 12/11/2020   History of present illness This 75 y.o. female admitted 12/01/20 after being found on floor by her female friend/house mate. CT showed acute Rt parafalcine SDH and scattered SAH overlying the Rt cerebral hemisphere. There was also question if she had a focal seizure. PMH includes: A-Fib, recent stroke, COPD, CA, HTN, PRES, s/p Rt THA.   OT comments  Kim Blankenship is progressing incrementally. She was alert and oriented this session and able to follow two step commands with minimal verbal cues. Pt had poor balance while sitting EOB with several LOB while completing functional LB tasks, but she was able to self correct. She was min A overall for functional ambulation with verbal cues throughout and she remains a high fall risk. Pt continued to benefit from continued OT acutely. D/c plan updated to SNF.    Recommendations for follow up therapy are one component of a multi-disciplinary discharge planning process, led by the attending physician.  Recommendations may be updated based on patient status, additional functional criteria and insurance authorization.    Follow Up Recommendations  Supervision/Assistance - 24 hour;SNF    Equipment Recommendations  3 in 1 bedside commode    Recommendations for Other Services      Precautions / Restrictions Precautions Precautions: Fall Restrictions Weight Bearing Restrictions: No       Mobility Bed Mobility Overal bed mobility: Needs Assistance Bed Mobility: Supine to Sit;Sit to Supine     Supine to sit: Min assist Sit to supine: Min guard   General bed mobility comments: min A for trunk elevation dur to back pain    Transfers Overall transfer level: Needs assistance Equipment used: None Transfers: Sit to/from Stand Sit to Stand: Min assist         General transfer comment: min A to steady once standing     Balance Overall balance assessment: Needs assistance Sitting-balance support: No upper extremity supported Sitting balance-Leahy Scale: Fair Sitting balance - Comments: LOB while sitting EOb to don socks   Standing balance support: During functional activity;Single extremity supported Standing balance-Leahy Scale: Poor Standing balance comment: min guard- min A for static and dynamic standing                           ADL either performed or assessed with clinical judgement   ADL Overall ADL's : Needs assistance/impaired                     Lower Body Dressing: Min guard;Sitting/lateral leans Lower Body Dressing Details (indicate cue type and reason): pt required significantly incrased time to put bilat socks on with slight LOB but able to self correct             Functional mobility during ADLs: Minimal assistance General ADL Comments: min A for functional mobility this session with limited step length, required verbal cue fro safety, and min A for balance     Vision       Perception     Praxis      Cognition Arousal/Alertness: Awake/alert Behavior During Therapy: WFL for tasks assessed/performed Overall Cognitive Status: Impaired/Different from baseline Area of Impairment: Attention;Following commands;Safety/judgement                   Current Attention Level: Focused Memory: Decreased short-term memory Following Commands: Follows multi-step commands inconsistently Safety/Judgement: Decreased awareness of safety;Decreased awareness  of deficits Awareness: Intellectual Problem Solving: Slow processing;Difficulty sequencing;Requires verbal cues General Comments: Pt Ox4 this session, she initally said it was "[place]" but quickly changed it to September. Pt able to follow two step commands this session without verbal cue        Exercises     Shoulder Instructions       General Comments VSS on 2L nasal canula    Pertinent Vitals/  Pain       Pain Assessment: Faces Faces Pain Scale: Hurts little more Pain Location: back with position changes Pain Descriptors / Indicators: Headache;Grimacing;Guarding Pain Intervention(s): Limited activity within patient's tolerance;Monitored during session  Home Living                                          Prior Functioning/Environment              Frequency  Min 2X/week        Progress Toward Goals  OT Goals(current goals can now be found in the care plan section)  Progress towards OT goals: Progressing toward goals  Acute Rehab OT Goals Patient Stated Goal: get back to bed OT Goal Formulation: With patient Time For Goal Achievement: 12/18/20 Potential to Achieve Goals: Good ADL Goals Pt Will Perform Grooming: with supervision;standing Pt Will Perform Upper Body Bathing: with supervision;sitting;standing Pt Will Perform Lower Body Bathing: with supervision;sit to/from stand Pt Will Perform Upper Body Dressing: with supervision;sitting;standing Pt Will Perform Lower Body Dressing: with supervision;sit to/from stand Pt Will Transfer to Toilet: with supervision;ambulating;regular height toilet;grab bars Pt Will Perform Toileting - Clothing Manipulation and hygiene: with supervision;sit to/from stand Additional ADL Goal #1: PT will independently verbalize 3 strateiges to reduce risk of falls Additional ADL Goal #2: Pt will verbalize 3 strategies for energy conservation  Plan Discharge plan needs to be updated    Co-evaluation                 AM-PAC OT "6 Clicks" Daily Activity     Outcome Measure   Help from another person eating meals?: None Help from another person taking care of personal grooming?: A Little Help from another person toileting, which includes using toliet, bedpan, or urinal?: A Little Help from another person bathing (including washing, rinsing, drying)?: A Little Help from another person to put on and taking off  regular upper body clothing?: A Little Help from another person to put on and taking off regular lower body clothing?: A Little 6 Click Score: 19    End of Session Equipment Utilized During Treatment: Oxygen  OT Visit Diagnosis: Unsteadiness on feet (R26.81);Dizziness and giddiness (R42)   Activity Tolerance Patient tolerated treatment well   Patient Left in bed;with call bell/phone within reach;with bed alarm set   Nurse Communication Mobility status (Restraints untied upon arrival, left untied at the end of the session)        Time: [id number] OT Time Calculation (min): 12 min  Charges: OT General Charges $OT Visit: 1 Visit OT Treatments $Therapeutic Activity: 8-22 mins   Mahdiya Mossberg A Mindy Behnken 12/11/2020, 5:18 PM

## 2020-12-11 NOTE — TOC Progression Note (Signed)
Transition of Care [facility]) - Progression Note    Patient Details  Name: Kim Blankenship MRN: [id number] Date of Birth: 01-Feb-1946  [facility]) CM/SW Contact  [name], RN Phone Number: 12/11/2020, 2:29 PM  Clinical Narrative:    Patient seen by PT today and has regressed with her mobility. CM has faxed her out with pts brother [name]) permission in the [facility] area. CM has provided him bed offers to review. CM asked MOA to start insurance authorization. Pt's PASAR under review and will need this number prior to d/c to SNF.  TOC following.   Expected Discharge Plan: Long Term Nursing Home Barriers to Discharge: Continued Medical Work up  Expected Discharge Plan and Services Expected Discharge Plan: [place]   Discharge Planning Services: CM Consult Post Acute Care Choice: [place] Living arrangements for the past 2 months: Apartment                                       Social Determinants of Health (SDOH) Interventions    Readmission Risk Interventions No flowsheet data found.

## 2020-12-11 NOTE — Plan of Care (Signed)

## 2020-12-12 DIAGNOSIS — I639 Cerebral infarction, unspecified: Secondary | ICD-10-CM

## 2020-12-12 LAB — HEPATIC FUNCTION PANEL
ALT: 20 U/L (ref 0–44)
AST: 25 U/L (ref 15–41)
Albumin: 3.1 g/dL — ABNORMAL LOW (ref 3.5–5.0)
Alkaline Phosphatase: 62 U/L (ref 38–126)
Bilirubin, Direct: 0.1 mg/dL (ref 0.0–0.2)
Indirect Bilirubin: 0.8 mg/dL (ref 0.3–0.9)
Total Bilirubin: 0.9 mg/dL (ref 0.3–1.2)
Total Protein: 6.3 g/dL — ABNORMAL LOW (ref 6.5–8.1)

## 2020-12-12 LAB — CBC WITH DIFFERENTIAL/PLATELET
Abs Immature Granulocytes: 0.04 10*3/uL (ref 0.00–0.07)
Basophils Absolute: 0.1 10*3/uL (ref 0.0–0.1)
Basophils Relative: 1 %
Eosinophils Absolute: 0.1 10*3/uL (ref 0.0–0.5)
Eosinophils Relative: 2 %
HCT: 34.8 % — ABNORMAL LOW (ref 36.0–46.0)
Hemoglobin: 11.9 g/dL — ABNORMAL LOW (ref 12.0–15.0)
Immature Granulocytes: 0 %
Lymphocytes Relative: 14 %
Lymphs Abs: 1.3 10*3/uL (ref 0.7–4.0)
MCH: 31.1 pg (ref 26.0–34.0)
MCHC: 34.2 g/dL (ref 30.0–36.0)
MCV: 90.9 fL (ref 80.0–100.0)
Monocytes Absolute: 0.9 10*3/uL (ref 0.1–1.0)
Monocytes Relative: 10 %
Neutro Abs: 6.7 10*3/uL (ref 1.7–7.7)
Neutrophils Relative %: 73 %
Platelets: 385 10*3/uL (ref 150–400)
RBC: 3.83 MIL/uL — ABNORMAL LOW (ref 3.87–5.11)
RDW: 13.2 % (ref 11.5–15.5)
WBC: 9.1 10*3/uL (ref 4.0–10.5)
nRBC: 0 % (ref 0.0–0.2)

## 2020-12-12 LAB — BASIC METABOLIC PANEL
Anion gap: 11 (ref 5–15)
BUN: 10 mg/dL (ref 8–23)
CO2: 26 mmol/L (ref 22–32)
Calcium: 9.3 mg/dL (ref 8.9–10.3)
Chloride: 98 mmol/L (ref 98–111)
Creatinine, Ser: 0.75 mg/dL (ref 0.44–1.00)
GFR, Estimated: >60 mL/min (ref >60)
Glucose, Bld: 144 mg/dL — ABNORMAL HIGH (ref 70–99)
Potassium: 4 mmol/L (ref 3.5–5.1)
Sodium: 135 mmol/L (ref 135–145)

## 2020-12-12 LAB — SARS CORONAVIRUS 2 (TAT 6-24 HRS): SARS Coronavirus 2: NEGATIVE

## 2020-12-12 LAB — VITAMIN B12: Vitamin B-12: 826 pg/mL (ref 180–914)

## 2020-12-12 LAB — AMMONIA: Ammonia: 19 umol/L (ref 9–35)

## 2020-12-12 MED ORDER — MOMETASONE FURO-FORMOTEROL FUM 200-5 MCG/ACT IN AERO
2.0000 | INHALATION_SPRAY | Freq: Two times a day (BID) | RESPIRATORY_TRACT | Status: DC
  Administered 2020-12-12 – 2020-12-13 (×2): 2 via RESPIRATORY_TRACT
  Filled 2020-12-12: qty 8.8

## 2020-12-12 NOTE — Progress Notes (Signed)
Subjective: The patient is alert and pleasant.  She knows that she does not know where she is.  Objective: Vital signs in last 24 hours: Temp:  [97.7 F (36.5 C)-99 F (37.2 C)] 98.9 F (37.2 C) (09/13 0749) Pulse Rate:  [55-107] 96 (09/13 0749) Resp:  [14-20] 16 (09/13 0749) BP: (117-158)/(71-83) 128/83 (09/13 0749) SpO2:  [90 %-100 %] 90 % (09/13 0749) Estimated body mass index is 20.28 kg/m as calculated from the following:   Height as of this encounter: '5\' 2"'$  (1.575 m).   Weight as of this encounter: 50.3 kg.   Intake/Output from previous day: No intake/output data recorded. Intake/Output this shift: No intake/output data recorded.  Physical exam patient is alert and pleasant.  She is oriented x1.  She is moving all 4 extremities well.  Her speech is clear.  Lab Results: Recent Labs    12/12/20 0335  WBC 9.1  HGB 11.9*  HCT 34.8*  PLT 385   BMET No results for input(s): NA, K, CL, CO2, GLUCOSE, BUN, CREATININE, CALCIUM in the last 72 hours.  Studies/Results: DG Chest Port 1 View  Result Date: 12/11/2020 CLINICAL DATA:  Altered mental status. EXAM: PORTABLE CHEST 1 VIEW COMPARISON:  Chest x-ray 09/30/2020. FINDINGS: There is no focal lung consolidation, pleural effusion or pneumothorax. There is a nodular density projecting over the right lower lung which may represent a nipple shadow. The cardiomediastinal silhouette is within normal limits. There are healed left-sided rib fractures. No acute fractures are seen. IMPRESSION: 1. No evidence for pneumonia or edema. 2. Nodular density projecting over the right lower lung favored is a nipple shadow. This can be confirmed with repeat study with nipple markers to exclude pulmonary nodule. Electronically Signed   By: Ronney Asters M.D.   On: 12/11/2020 20:37    Assessment/Plan: Traumatic brain injury: We are awaiting skilled nursing facility placement.  LOS: 11 days     Ophelia Charter 12/12/2020, 7:57  AM     Patient ID: Kim Blankenship, female   DOB: Aug 29, 1945, 75 y.o.   MRN: [id number]

## 2020-12-12 NOTE — Progress Notes (Signed)
PROGRESS NOTE    Kim Blankenship  [id number] DOB: 05-13-1945 DOA: 12/01/2020 PCP: Jolinda Croak, MD   Brief Narrative: 75 year old past medical history significant for PAF not on anticoagulation due to frequent fall, recent stroke on dual antiplatelet, hypertension, COPD, seizure disorder, depression patient presented after mechanical fall, head injury and subdural hematoma and SAH. Patient has been under the care of neurosurgery over the last 10 days, SDH and SAH stabilized, however increasingly patient has had symptoms of frequent confusion, agitation and sundowning.  Assessment & Plan:   Active Problems:   Subarachnoid hemorrhage (HCC)   1-Delirium: Agitation.  ? History of Dementia.  Restarted on Seroquel  On haldol PRN.  Work Up for infection: Chest x ray negative for PNA, UA pending.  Electrolytes normal, LFT normal.  B 12 normal.   2-Fall, Sub-dural  hematoma, SAH;  Per Neurosurgery.   3-History of seizure;  Continue with Keppra.   4-PAF; not on anticoagulation due to frequent fall.   5-COPD; [name].  Albuterol PRN.    Nutrition Problem: Inadequate oral intake Etiology: decreased appetite, lethargy/confusion    Signs/Symptoms: meal completion < 50%    Interventions: Ensure Enlive (each supplement provides 350kcal and 20 grams of protein), MVI  Estimated body mass index is 20.28 kg/m as calculated from the following:   Height as of this encounter: '5\' 2"'$  (1.575 m).   Weight as of this encounter: 50.3 kg.   DVT prophylaxis: Heparin Code Status: Full Code Family Communication: No family at bedside.  Disposition Plan:  Status is: Inpatient  Remains inpatient appropriate because:Inpatient level of care appropriate due to severity of illness  Dispo: The patient is from: Home              Anticipated d/c is to: SNF              Patient currently is not medically stable to d/c.   Difficult to place patient  No          Procedures:    Antimicrobials:  None  Subjective: She keep eyes close, she knows she is in the hospital, doesn't know why.   Objective: Vitals:   12/12/20 0417 12/12/20 0749 12/12/20 1138 12/12/20 1650  BP: (!) 147/79 128/83 134/68 (!) 145/82  Pulse: (!) 55 96 90 100  Resp: '20 16 18 19  '$ Temp: 98 F (36.7 C) 98.9 F (37.2 C) 97.7 F (36.5 C) 98.8 F (37.1 C)  TempSrc: Oral Axillary Axillary Oral  SpO2: 95% 90% 98% 92%  Weight:      Height:       No intake or output data in the 24 hours ending 12/12/20 1744 Filed Weights   12/01/20 1135  Weight: 50.3 kg    Examination:  General exam: Appears calm and comfortable  Respiratory system: Clear to auscultation. Respiratory effort normal. Cardiovascular system: S1 & S2 heard, RRR. No JVD, murmurs, rubs, gallops or clicks. No pedal edema. Gastrointestinal system: Abdomen is nondistended, soft and nontender. No organomegaly or masses felt. Normal bowel sounds heard. Central nervous system: Keep eyes close, follows some command, moves extremities.  Extremities: Symmetric 5 x 5 power.    Data Reviewed: I have personally reviewed following labs and imaging studies  CBC: Recent Labs  Lab 12/12/20 0335  WBC 9.1  NEUTROABS 6.7  HGB 11.9*  HCT 34.8*  MCV 90.9  PLT 0000000   Basic Metabolic Panel: Recent Labs  Lab 12/12/20 0634  NA 135  K 4.0  CL 98  CO2 26  GLUCOSE 144*  BUN 10  CREATININE 0.75  CALCIUM 9.3   GFR: Estimated Creatinine Clearance: 49.5 mL/min (by C-G formula based on SCr of 0.75 mg/dL). Liver Function Tests: Recent Labs  Lab 12/12/20 0634  AST 25  ALT 20  ALKPHOS 62  BILITOT 0.9  PROT 6.3*  ALBUMIN 3.1*   No results for input(s): LIPASE, AMYLASE in the last 168 hours. Recent Labs  Lab 12/12/20 0634  AMMONIA 19   Coagulation Profile: No results for input(s): INR, PROTIME in the last 168 hours. Cardiac Enzymes: No results for input(s): CKTOTAL, CKMB, CKMBINDEX,  TROPONINI in the last 168 hours. BNP (last 3 results) No results for input(s): PROBNP in the last 8760 hours. HbA1C: No results for input(s): HGBA1C in the last 72 hours. CBG: No results for input(s): GLUCAP in the last 168 hours. Lipid Profile: No results for input(s): CHOL, HDL, LDLCALC, TRIG, CHOLHDL, LDLDIRECT in the last 72 hours. Thyroid Function Tests: No results for input(s): TSH, T4TOTAL, FREET4, T3FREE, THYROIDAB in the last 72 hours. Anemia Panel: Recent Labs    12/12/20 0634  VITAMINB12 826   Sepsis Labs: No results for input(s): PROCALCITON, LATICACIDVEN in the last 168 hours.  Recent Results (from the past 240 hour(s))  SARS CORONAVIRUS 2 (TAT 6-24 HRS) Nasopharyngeal Nasopharyngeal Swab     Status: None   Collection Time: 12/04/20 10:48 PM   Specimen: Nasopharyngeal Swab  Result Value Ref Range Status   SARS Coronavirus 2 NEGATIVE NEGATIVE Final    Comment: (NOTE) SARS-CoV-2 target nucleic acids are NOT DETECTED.  The SARS-CoV-2 RNA is generally detectable in upper and lower respiratory specimens during the acute phase of infection. Negative results do not preclude SARS-CoV-2 infection, do not rule out co-infections with other pathogens, and should not be used as the sole basis for treatment or other patient management decisions. Negative results must be combined with clinical observations, patient history, and epidemiological information. The expected result is Negative.  Fact Sheet for Patients: SugarRoll.be  Fact Sheet for Healthcare Providers: https://www.woods-mathews.com/  This test is not yet approved or cleared by the [place] FDA and  has been authorized for detection and/or diagnosis of SARS-CoV-2 by FDA under an Emergency Use Authorization (EUA). This EUA will remain  in effect (meaning this test can be used) for the duration of the COVID-19 declaration under Se ction 564(b)(1) of the Act, 21  U.S.C. section 360bbb-3(b)(1), unless the authorization is terminated or revoked sooner.  Performed at [facility], [place] [address] [place], [place] [address]   SARS CORONAVIRUS 2 (TAT 6-24 HRS)     Status: None   Collection Time: 12/08/20  1:26 PM  Result Value Ref Range Status   SARS Coronavirus 2 NEGATIVE NEGATIVE Final    Comment: (NOTE) SARS-CoV-2 target nucleic acids are NOT DETECTED.  The SARS-CoV-2 RNA is generally detectable in upper and lower respiratory specimens during the acute phase of infection. Negative results do not preclude SARS-CoV-2 infection, do not rule out co-infections with other pathogens, and should not be used as the sole basis for treatment or other patient management decisions. Negative results must be combined with clinical observations, patient history, and epidemiological information. The expected result is Negative.  Fact Sheet for Patients: SugarRoll.be  Fact Sheet for Healthcare Providers: https://www.woods-mathews.com/  This test is not yet approved or cleared by the [place] FDA and  has been authorized for detection and/or diagnosis of SARS-CoV-2 by FDA under an Emergency  Use Authorization (EUA). This EUA will remain  in effect (meaning this test can be used) for the duration of the COVID-19 declaration under Se ction 564(b)(1) of the Act, 21 U.S.C. section 360bbb-3(b)(1), unless the authorization is terminated or revoked sooner.  Performed at [facility], [place] [address], [place], [place] [address]   Resp Panel by RT-PCR (Flu A&B, Covid) Nasopharyngeal Swab     Status: None   Collection Time: 12/10/20  2:27 PM   Specimen: Nasopharyngeal Swab; Nasopharyngeal(NP) swabs in vial transport medium  Result Value Ref Range Status   SARS Coronavirus 2 by RT PCR NEGATIVE NEGATIVE Final    Comment: (NOTE) SARS-CoV-2 target nucleic acids are NOT DETECTED.  The SARS-CoV-2 RNA is  generally detectable in upper respiratory specimens during the acute phase of infection. The lowest concentration of SARS-CoV-2 viral copies this assay can detect is 138 copies/mL. A negative result does not preclude SARS-Cov-2 infection and should not be used as the sole basis for treatment or other patient management decisions. A negative result may occur with  improper specimen collection/handling, submission of specimen other than nasopharyngeal swab, presence of viral mutation(s) within the areas targeted by this assay, and inadequate number of viral copies(<138 copies/mL). A negative result must be combined with clinical observations, patient history, and epidemiological information. The expected result is Negative.  Fact Sheet for Patients:  EntrepreneurPulse.com.au  Fact Sheet for Healthcare Providers:  IncredibleEmployment.be  This test is no t yet approved or cleared by the [place] FDA and  has been authorized for detection and/or diagnosis of SARS-CoV-2 by FDA under an Emergency Use Authorization (EUA). This EUA will remain  in effect (meaning this test can be used) for the duration of the COVID-19 declaration under Section 564(b)(1) of the Act, 21 U.S.C.section 360bbb-3(b)(1), unless the authorization is terminated  or revoked sooner.       Influenza A by PCR NEGATIVE NEGATIVE Final   Influenza B by PCR NEGATIVE NEGATIVE Final    Comment: (NOTE) The Xpert Xpress SARS-CoV-2/FLU/RSV plus assay is intended as an aid in the diagnosis of influenza from Nasopharyngeal swab specimens and should not be used as a sole basis for treatment. Nasal washings and aspirates are unacceptable for Xpert Xpress SARS-CoV-2/FLU/RSV testing.  Fact Sheet for Patients: EntrepreneurPulse.com.au  Fact Sheet for Healthcare Providers: IncredibleEmployment.be  This test is not yet approved or cleared by the [place] FDA and has been authorized for detection and/or diagnosis of SARS-CoV-2 by FDA under an Emergency Use Authorization (EUA). This EUA will remain in effect (meaning this test can be used) for the duration of the COVID-19 declaration under Section 564(b)(1) of the Act, 21 U.S.C. section 360bbb-3(b)(1), unless the authorization is terminated or revoked.  Performed at [facility], [place] [address] [place],  [address]          Radiology Studies: DG Chest Port 1 View  Result Date: 12/11/2020 CLINICAL DATA:  Altered mental status. EXAM: PORTABLE CHEST 1 VIEW COMPARISON:  Chest x-ray 09/30/2020. FINDINGS: There is no focal lung consolidation, pleural effusion or pneumothorax. There is a nodular density projecting over the right lower lung which may represent a nipple shadow. The cardiomediastinal silhouette is within normal limits. There are healed left-sided rib fractures. No acute fractures are seen. IMPRESSION: 1. No evidence for pneumonia or edema. 2. Nodular density projecting over the right lower lung favored is a nipple shadow. This can be confirmed with repeat study with nipple markers to exclude pulmonary  nodule. Electronically Signed   By: Ronney Asters M.D.   On: 12/11/2020 20:37        Scheduled Meds:  atorvastatin  40 mg Oral QHS   busPIRone  10 mg Oral BID   docusate sodium  100 mg Oral BID   feeding supplement  237 mL Oral TID BM   ferrous sulfate  325 mg Oral BID WC   folic acid  1 mg Oral Daily   heparin injection (subcutaneous)  5,000 Units Subcutaneous Q8H   levETIRAcetam  500 mg Oral BID   mometasone-formoterol  2 puff Inhalation BID   multivitamin with minerals  1 tablet Oral Daily   OLANZapine  2.5 mg Oral QHS   pantoprazole  40 mg Oral Daily   QUEtiapine  200 mg Oral QHS   QUEtiapine  50 mg Oral Daily   spironolactone  25 mg Oral Daily   thiamine  100 mg Oral Daily   Continuous Infusions:  sodium chloride Stopped (12/02/20 1550)      LOS: 11 days    Time spent: 35 minutes.     Elmarie Shiley, MD [facility]   If 7PM-7AM, please contact night-coverage [email or web address]  12/12/2020, 5:44 PM {

## 2020-12-12 NOTE — TOC Progression Note (Signed)
Transition of Care [facility]) - Progression Note    Patient Details  Name: Gunda Hosseini MRN: [id number] Date of Birth: 1945/05/03  Transition [facility]) CM/SW Contact  [name], RN Phone Number: 12/12/2020, 1:04 PM  Clinical Narrative:    Since pt is doing more poorly mobility wise rec has changed to SNF rehab. CM provided her brother bed offers and he selected [facility]. They wont have a bed in the next couple days. CM updated her brother [name]) and he chose [facility].  Pt has insurance auth. PASAR number done.  Medical MD states she is not medically ready today but plan is for tomorrow d/c.  TOC following.   Expected Discharge Plan: Long Term Nursing Home Barriers to Discharge: Continued Medical Work up  Expected Discharge Plan and Services Expected Discharge Plan: [place]   Discharge Planning Services: CM Consult Post Acute Care Choice: [place] Living arrangements for the past 2 months: Apartment                                       Social Determinants of Health (SDOH) Interventions    Readmission Risk Interventions No flowsheet data found.

## 2020-12-13 DIAGNOSIS — I609 Nontraumatic subarachnoid hemorrhage, unspecified: Secondary | ICD-10-CM

## 2020-12-13 LAB — URINALYSIS, ROUTINE W REFLEX MICROSCOPIC
Bilirubin Urine: NEGATIVE
Glucose, UA: NEGATIVE mg/dL
Hgb urine dipstick: NEGATIVE
Ketones, ur: 5 mg/dL — AB
Nitrite: NEGATIVE
Protein, ur: NEGATIVE mg/dL
Specific Gravity, Urine: 1.016 (ref 1.005–1.030)
WBC, UA: >50 WBC/hpf — ABNORMAL HIGH (ref 0–5)
pH: 6 (ref 5.0–8.0)

## 2020-12-13 MED ORDER — BENZONATATE 200 MG PO CAPS: 200.0000 mg | ORAL_CAPSULE | Freq: Three times a day (TID) | ORAL | 0 refills | Status: AC | PRN

## 2020-12-13 MED ORDER — HALOPERIDOL 1 MG PO TABS: 1.0000 mg | ORAL_TABLET | Freq: Four times a day (QID) | ORAL | 0 refills | Status: AC | PRN

## 2020-12-13 MED ORDER — LEVETIRACETAM 500 MG PO TABS: 500.0000 mg | ORAL_TABLET | Freq: Two times a day (BID) | ORAL | Status: AC

## 2020-12-13 MED ORDER — QUETIAPINE FUMARATE 50 MG PO TABS: 50.0000 mg | ORAL_TABLET | Freq: Every day | ORAL | Status: AC

## 2020-12-13 NOTE — TOC Transition Note (Signed)
Transition of Care [facility]) - CM/SW Discharge Note   Patient Details  Name: [name] MRN: [id number] Date of Birth: [birth year]  Transition of Care [facility]) CM/SW Contact:  [name], RN Phone Number: 12/13/2020, 11:55 AM   Clinical Narrative:    Patient is discharging to [facility] today. Pt will transport via [facility]. Bedside RN updated and d/c packet at the desk.   Room: 117A Number for report: [id number]   Final next level of care: Skilled Nursing Facility Barriers to Discharge: No Barriers Identified   Patient Goals and CMS Choice   CMS Medicare.gov Compare Post Acute Care list provided to:: Patient Represenative (must comment) Choice offered to / list presented to : Sibling  Discharge Placement PASRR number recieved: 12/12/20            Patient chooses bed at: [facility] Patient to be transferred to facility by: [place] Name of family member notified: Gerald--brother Patient and family notified of of transfer: 12/13/20  Discharge Plan and Services   Discharge Planning Services: CM Consult Post Acute Care Choice: [place]                               Social Determinants of Health (SDOH) Interventions     Readmission Risk Interventions No flowsheet data found.

## 2020-12-13 NOTE — Progress Notes (Signed)
Physical Therapy Treatment Patient Details Name: Kim Blankenship MRN: [id number] DOB: 02-18-1946 Today's Date: 12/13/2020   History of Present Illness This 75 y.o. female admitted 12/01/20 after being found on floor by her female friend/house mate. CT showed acute Rt parafalcine SDH and scattered SAH overlying the Rt cerebral hemisphere. There was also question if she had a focal seizure. PMH includes: A-Fib, recent stroke, COPD, CA, HTN, PRES, s/p Rt THA.    PT Comments    Pt received in supine, A&O x1 and with good participation and tolerance for short distance gait and standing balance tasks in room today. Pt moderately fatigued (5/10 modified RPE) after performing platform step-ups in room x5 and gait trial ~38f with RW (portion also with no AD). Pt needing up to minA and max cueing for safety/sequencing when performing functional mobility tasks. Pt continues to benefit from PT services to progress toward functional mobility goals.   Recommendations for follow up therapy are one component of a multi-disciplinary discharge planning process, led by the attending physician.  Recommendations may be updated based on patient status, additional functional criteria and insurance authorization.  Follow Up Recommendations  SNF;Supervision/Assistance - 24 hour;Supervision for mobility/OOB     Equipment Recommendations  Rolling walker with 5" wheels    Recommendations for Other Services       Precautions / Restrictions Precautions Precautions: Fall Precaution Comments: SBP <160; chronic dizziness Restrictions Weight Bearing Restrictions: No     Mobility  Bed Mobility Overal bed mobility: Needs Assistance Bed Mobility: Supine to Sit;Sit to Supine     Supine to sit: Min guard Sit to supine: Min assist   General bed mobility comments: minA to guide BLE into bed and max cues for bridging to scoot to middle of bed    Transfers Overall transfer level: Needs assistance Equipment used:  None;Rolling walker (2 wheeled) Transfers: Sit to/from Stand Sit to Stand: Min assist;Supervision         General transfer comment: min A to steady once standing without AD, but able to stand from toilet with wall rail/RW support and Supervision/safety cues; needs at least min guard for standing at sink and while adjusting clothing for toileting  Ambulation/Gait Ambulation/Gait assistance: Min assist;Min guard Gait Distance (Feet): 40 Feet (120fto toilet with RW, then 4055fn room with RW and no AD (~60f25f and ~10ft35fAD)) Assistive device: Rolling walker (2 wheeled);None Gait Pattern/deviations: Step-through pattern;Trunk flexed;Narrow base of support;Drifts right/left;Decreased stride length Gait velocity: decreased Gait velocity interpretation: <1.8 ft/sec, indicate of risk for recurrent falls General Gait Details: pt needing minA with no AD due to lateral instability and decreased safety and difficulty navigating in narrow spaces in room, and min guard with RW use but max cues for directional navigation and safety   Stairs Stairs: Yes Stairs assistance: Min assist Stair Management: Two rails;Step to pattern;Forwards Number of Stairs: 5 General stair comments: pt ascended/descended single 7" step x5 reps with greatly increased time and cues for safety/sequencing, pt frequently forgetting task while in process of descending and needs reorientation to situation/task. minA for stability when stepping up and down with BUE support   Wheelchair Mobility    Modified Rankin (Stroke Patients Only) Modified Rankin (Stroke Patients Only) Pre-Morbid Rankin Score: Moderate disability Modified Rankin: Moderately severe disability     Balance Overall balance assessment: Needs assistance Sitting-balance support: No upper extremity supported Sitting balance-Leahy Scale: Fair Sitting balance - Comments: min guard to Supervision for static/dynamic seated tasks   Standing  balance  support: During functional activity;Single extremity supported Standing balance-Leahy Scale: Poor Standing balance comment: min A for static and dynamic standing with no AD, min guard when navigating with RW          Cognition Arousal/Alertness: Awake/alert Behavior During Therapy: Flat affect;Impulsive Overall Cognitive Status: Impaired/Different from baseline Area of Impairment: Attention;Following commands;Safety/judgement;Memory;Problem solving           Orientation Level: Disoriented to;Place;Time;Situation Current Attention Level: Focused Memory: Decreased short-term memory Following Commands: Follows multi-step commands inconsistently;Follows one step commands consistently Safety/Judgement: Decreased awareness of safety;Decreased awareness of deficits Awareness: Intellectual Problem Solving: Slow processing;Difficulty sequencing;Requires verbal cues General Comments: Pt Ox1 this session, not able to state current month even with hints, states "hotel" for current location, not oriented to situation/reason for admission; cooperative with encouragement and calm demeanor, poor carryover of instruction within session ("let's walk to the platform step" and pt walks past step)      Exercises      General Comments General comments (skin integrity, edema, etc.): SpO2 93-94% on RA, HR reading 58 bpm but poor pleth signal      Pertinent Vitals/Pain Pain Assessment: No/denies pain Faces Pain Scale: No hurt Pain Intervention(s): Monitored during session;Repositioned           PT Goals (current goals can now be found in the care plan section) Acute Rehab PT Goals Patient Stated Goal: to go to rehab and get stronger PT Goal Formulation: Patient unable to participate in goal setting Time For Goal Achievement: 12/16/20 Potential to Achieve Goals: Fair Progress towards PT goals: Progressing toward goals    Frequency    Min 3X/week      PT Plan Current plan remains  appropriate       AM-PAC PT "6 Clicks" Mobility   Outcome Measure  Help needed turning from your back to your side while in a flat bed without using bedrails?: A Little Help needed moving from lying on your back to sitting on the side of a flat bed without using bedrails?: A Little Help needed moving to and from a bed to a chair (including a wheelchair)?: A Little Help needed standing up from a chair using your arms (e.g., wheelchair or bedside chair)?: A Little Help needed to walk in hospital room?: A Lot (max cues) Help needed climbing 3-5 steps with a railing? : A Lot (+1 physical assist but only 1 step at a time performed) 6 Click Score: 16    End of Session Equipment Utilized During Treatment: Gait belt Activity Tolerance: Patient tolerated treatment well Patient left: in bed;with call bell/phone within reach;with bed alarm set Nurse Communication: Mobility status PT Visit Diagnosis: Unsteadiness on feet (R26.81);Muscle weakness (generalized) (M62.81);History of falling (Z91.81)     Time: 1040-1110 PT Time Calculation (min) (ACUTE ONLY): 30 min  Charges:  $Gait Training: 8-22 mins $Therapeutic Activity: 8-22 mins                     Fiona Coto P., PTA Acute Rehabilitation Services Pager: [phone] Office: [place] 12/13/2020, 12:06 PM

## 2020-12-13 NOTE — Progress Notes (Signed)
Called and gave report to [place] at [place]

## 2020-12-13 NOTE — Discharge Summary (Signed)
Physician Discharge Summary     Providing Compassionate, Quality Care - Together   Patient ID: Kim Blankenship MRN: [id number] DOB/AGE: 1946/02/10 75 y.o.  Admit date: 12/01/2020 Discharge date: 12/13/2020  Admission Diagnoses: Subarachnoid hemorrhage  Discharge Diagnoses:  Active Problems:   Subarachnoid hemorrhage [facility])   Discharged Condition: good  Hospital Course:  Patient was brought in to the [facility] emergency department via EMS after being found down by her son. She was very confused initially, but improved somewhat in the ED. The CT head that was performed in the ED revealed an acute right parafalcine subdural hematoma and scattered subarachnoid hemorrhage overlying the right cerebral hemisphere. Patient was observed in the ICU and no surgical intervention was necessary. She has had episodes of confusion and agitation that responded well to an increase in Seroquel. She is no longer requiring oral pain medication for her head. She has worked with therapies, who feel the patient would benefit from further rehabilitation at a skilled nursing facility. From a Neurosurgical perspective, it is fine to restart her Plavix. If it is felt the patient is still at an increased risk for falls, I would highly recommend weighing the risks versus the benefits of keeping her on this medication. Patient should follow up with her cardiologist in regards to her anticoagulation.   Consults: rehabilitation medicine and Hospitalists  Significant Diagnostic Studies: labs:  Results for orders placed or performed during the hospital encounter of 12/01/20 (from the past 24 hour(s))  SARS CORONAVIRUS 2 (TAT 6-24 HRS) Nasopharyngeal Nasopharyngeal Swab     Status: None   Collection Time: 12/12/20  3:04 PM   Specimen: Nasopharyngeal Swab  Result Value Ref Range   SARS Coronavirus 2 NEGATIVE NEGATIVE     CT HEAD WO CONTRAST (5MM)  Result Date: 12/02/2020 CLINICAL DATA:  Subarachnoid hemorrhage  follow-up EXAM: CT HEAD WITHOUT CONTRAST TECHNIQUE: Contiguous axial images were obtained from the base of the skull through the vertex without intravenous contrast. COMPARISON:  CT from yesterday FINDINGS: Brain: Subarachnoid hemorrhage along the right sylvian fissure has diffused. Intraventricular clot is now seen dependently in the right lateral ventricle. A subdural hematoma along the falx has also diffused, maximal thickness is 5 mm. No infarct, hydrocephalus, or masslike finding Vascular: No hyperdense vessel or unexpected calcification. Skull: No acute finding. 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 Sinuses/Orbits: No visible injury IMPRESSION: 1. Diffused subarachnoid hemorrhage with small volume layering intraventricular clot. No hydrocephalus. 2. More diffuse right parafalcine subdural hematoma but decreased maximal thickness now measuring 5 mm. Electronically Signed   By: Monte Fantasia M.D.   On: 12/02/2020 05:15   CT HEAD WO CONTRAST (5MM)  Result Date: 12/01/2020 CLINICAL DATA:  Altered mental status EXAM: CT HEAD WITHOUT CONTRAST TECHNIQUE: Contiguous axial images were obtained from the base of the skull through the vertex without intravenous contrast. COMPARISON:  CT head 10/29/2020, brain MRI 10/19/2020 FINDINGS: Brain: There is an acute right parafalcine subdural hematomar measuring up to 9 mm in the coronal plane. There is mild mass effect on the underlying brain parenchyma. There is scattered subarachnoid hemorrhage predominantly in the right sylvian fissure. There is no intraventricular hemorrhage. There is a prior infarct in the right caudate head. Additional foci of hypodensity in the subcortical and periventricular white matter likely reflect sequela of chronic white matter microangiopathy. There is no mass lesion. There is no midline shift. The ventricles are not enlarged. Vascular: No hyperdense vessel or unexpected calcification. Skull: There is a right  parieto-occipital scalp hematoma without underlying calvarial fracture. Sinuses/Orbits: The paranasal sinuses are clear. Bilateral lens implants are in place. The globes and orbits are otherwise unremarkable. Other: None. IMPRESSION: 1. Acute right parafalcine subdural hematoma and scattered subarachnoid hemorrhage overlying the right cerebral hemisphere. 2. No intraventricular extension or midline shift. 3. Right parieto-occipital scalp hematoma without underlying calvarial fracture. Critical Value/emergent results were called by telephone at the time of interpretation on 12/01/2020 at 12:25 pm to provider MATTHEW TRIFAN , who verbally acknowledged these results. Electronically Signed   By: Valetta Mole M.D.   On: 12/01/2020 12:35   EEG adult  Result Date: 12/01/2020 Lora Havens, MD     12/01/2020  2:47 PM Patient Name: Kim Blankenship MRN: [id number] Epilepsy Attending: Lora Havens Referring Physician/Provider: Dr Su Monks Date: 12/01/2020 Duration: 25.39 mins Patient history: 75yo F with right SDH and BL lower extremity twitching. EEG to evaluate for seizure. Level of alertness: Awake AEDs during EEG study: LEV Technical aspects: This EEG study was done with scalp electrodes positioned according to the 10-20 International system of electrode placement. Electrical activity was acquired at a sampling rate of '500Hz'$  and reviewed with a high frequency filter of '70Hz'$  and a low frequency filter of '1Hz'$ . EEG data were recorded continuously and digitally stored. Description: The posterior dominant rhythm consists of 9 Hz activity of moderate voltage (25-35 uV) seen predominantly in posterior head regions, symmetric and reactive to eye opening and eye closing.  EEG showed intermittent generalized, maximal bifrontal 3 to 5 Hz theta-delta slowing.  Sharp transients were noted in right frontal region. Patient was noted to have bilateral lower extremity stiffening/jerking intermittently throughout the study.  Concomitant EEG before, during and after the event did not show any EEG to suggest seizure. Hyperventilation and photic stimulation were not performed.   ABNORMALITY -Intermittent slow, generalized and maximal bifrontal IMPRESSION: This study is suggestive of nonspecific cortical dysfunction in bifrontal region as well as mild diffuse encephalopathy, nonspecific etiology.  Patient was noted to have bilateral lower extremity stiffening/jerking intermittently throughout the study without concomitant EEG change.  These episodes were most likely not epileptic.  No seizures or definite epileptiform discharges were seen throughout the recording. If suspicion for ictal-interictal activity remains a concern, a prolonged study can be considered. Priyanka Barbra Sarks     Treatments: therapies: PT, OT, and ST  Discharge Exam: Blood pressure 127/77, pulse 97, temperature 98.7 F (37.1 C), temperature source Oral, resp. rate 20, height '5\' 2"'$  (1.575 m), weight 50.3 kg, SpO2 93 %.  Oriented to person, disoriented to place, time, and situation PERRLA Speech clear, fluent CN II-XII grossly intact MAE, Strength and sensation intact Unsteady gait  Disposition: Discharge disposition: 03-Skilled Nursing Facility        Allergies as of 12/13/2020       Reactions   Levofloxacin    Other reaction(s): Other Leg pain   Tape Other (See Comments)   THE PATIENT'S SKIN IS THIN AND TEARS VERY EASILY (she "picks" at it; please use coban wrap)        Medication List     STOP taking these medications    chlorpheniramine-HYDROcodone 10-8 MG/5ML Suer Commonly known as: TUSSIONEX   predniSONE 10 MG (21) Tbpk tablet Commonly known as: STERAPRED UNI-PAK 21 TAB   thiamine 100 MG tablet       TAKE these medications    albuterol 108 (90 Base) MCG/ACT inhaler Commonly known as: VENTOLIN HFA Inhale 2 puffs into the lungs  every 4 (four) hours as needed for wheezing or shortness of breath.   amLODipine 2.5  MG tablet Commonly known as: NORVASC Take 2.5 mg by mouth daily.   aspirin EC 81 MG tablet Take 1 tablet (81 mg total) by mouth daily.   atorvastatin 40 MG tablet Commonly known as: LIPITOR Take 1 tablet (40 mg total) by mouth at bedtime.   azithromycin 500 MG tablet Commonly known as: ZITHROMAX Take 500 mg by mouth daily.   benzonatate 200 MG capsule Commonly known as: TESSALON Take 1 capsule (200 mg total) by mouth 3 (three) times daily as needed for cough.   calcium carbonate 500 MG chewable tablet Commonly known as: TUMS - dosed in mg elemental calcium Chew 1 tablet by mouth daily as needed for indigestion or heartburn.   carvedilol 6.25 MG tablet Commonly known as: Coreg Take 1 tablet (6.25 mg total) by mouth 2 (two) times daily with a meal.   clopidogrel 75 MG tablet Commonly known as: PLAVIX Take 75 mg by mouth daily.   Dulera 200-5 MCG/ACT Aero Generic drug: mometasone-formoterol Inhale 2 puffs into the lungs in the morning and at bedtime.   ferrous sulfate 325 (65 FE) MG tablet Take 1 tablet (325 mg total) by mouth 2 (two) times daily with a meal.   folic acid 1 MG tablet Commonly known as: FOLVITE Take 1 tablet (1 mg total) by mouth daily.   haloperidol 1 MG tablet Commonly known as: HALDOL Take 1 tablet (1 mg total) by mouth every 6 (six) hours as needed for agitation.   levETIRAcetam 500 MG tablet Commonly known as: KEPPRA Take 1 tablet (500 mg total) by mouth 2 (two) times daily.   meclizine 25 MG tablet Commonly known as: ANTIVERT Take 1 tablet (25 mg total) by mouth 3 (three) times daily as needed for dizziness.   metoprolol tartrate 50 MG tablet Commonly known as: LOPRESSOR Take 50 mg by mouth 2 (two) times daily.   multivitamin with minerals Tabs tablet Take 1 tablet by mouth daily.   pantoprazole 40 MG tablet Commonly known as: PROTONIX Take 1 tablet (40 mg total) by mouth daily.   QUEtiapine 100 MG tablet Commonly known as:  SEROQUEL Take 2 tablets (200 mg total) by mouth at bedtime. What changed: Another medication with the same name was added. Make sure you understand how and when to take each.   QUEtiapine 50 MG tablet Commonly known as: SEROQUEL Take 1 tablet (50 mg total) by mouth daily. What changed: You were already taking a medication with the same name, and this prescription was added. Make sure you understand how and when to take each.   spironolactone 25 MG tablet Commonly known as: ALDACTONE Take 25 mg by mouth daily.        Follow-up Information     Newman Pies, MD Follow up.   Specialty: Neurosurgery Why: As needed Contact information: [address] [place] [address] [phone]                 Signed: Viona Gilmore, DNP, AGNP-C Nurse Practitioner  [facility] [place] [address], [address], [place], [place] [address] P: [phone]    F: [phone]  12/13/2020, 11:04 AM

## 2020-12-13 NOTE — Progress Notes (Signed)
PROGRESS NOTE    Kim Blankenship  [id number] DOB: 1946-03-21 DOA: 12/01/2020 PCP: Jolinda Croak, MD   Brief Narrative:  This 75 year old female with past medical history significant for PAF not on anticoagulation due to frequent falls, recent stroke on dual antiplatelets, hypertension, COPD, seizure disorder, depression  presented after mechanical fall, with head injury and subdural hematoma and SAH. Patient has been under the care of neurosurgery over the last 10 days, SDH and SAH stabilized, however patient has developed symptoms of frequent confusion, agitation and sundowning.  We were consulted for medical management.   Assessment & Plan:   Active Problems:   Subarachnoid hemorrhage (HCC)  Delirium: Agitation. > Resolved. Patient does have a history of dementia. Resumed patient on Seroquel Continue as needed Haldol. Work Up for infection unremarkable.  Chest x ray negative for PNA,  UA - Electrolytes normal, LFT normal.  B 12 normal.    Fall , Sub-dural  hematoma, SAH;  Per Neurosurgery.  Patient is being discharged to skilled nursing facility for rehab.   History of seizures. Continue with Keppra.    Paroxysmal A. fib : not on anticoagulation due to frequent fall.    COPD  :resume Dulera.  Albuterol PRN.      Nutrition Problem: Inadequate oral intake Etiology: decreased appetite, lethargy/confusion   We are signing off.  Please reconsult if needed.   Patient is medically clear to be discharged to skilled nursing facility.   DVT prophylaxis:  SCDS. Code Status: Full code. Family Communication: No family at bed side. Disposition Plan:   Status is: Inpatient  Remains inpatient appropriate because:Inpatient level of care appropriate due to severity of illness  Dispo: The patient is from: Home              Anticipated d/c is to: SNF              Patient currently is medically stable to d/c.   Difficult to place patient No    Subjective: Patient  was seen and examined at bedside.  She reports feeling better,  denies any overnight events. Patient is being discharged to skilled nursing facility for rehab.  Objective: Vitals:   12/12/20 2313 12/13/20 0344 12/13/20 0720 12/13/20 1146  BP: (!) 157/82 (!) 155/90 127/77 (!) 147/89  Pulse: (!) 56 (!) 108 97 (!) 103  Resp: (!) 22 (!) '22 20 20  '$ Temp: 98.9 F (37.2 C) 98.2 F (36.8 C) 98.7 F (37.1 C) 99.3 F (37.4 C)  TempSrc: Oral Oral Oral Oral  SpO2: 93% 97% 93% 93%  Weight:      Height:       No intake or output data in the 24 hours ending 12/13/20 1247 Filed Weights   12/01/20 1135  Weight: 50.3 kg    Examination:  General exam:  Appears alert, comfortable, not in any acute distress. Respiratory system: Clear to auscultation bilaterally, no wheezing. Cardiovascular system: S1-S2 heard, regular rhythm, no murmur. Gastrointestinal system: Abdomen is soft, nontender, nondistended, BS + Central nervous system: Alert and oriented X 3. No focal neurological deficits. Extremities: No edema, no cyanosis, no clubbing. Skin: No rashes, lesions or ulcers Psychiatry: Judgement and insight appear normal. Mood & affect appropriate.     Data Reviewed: I have personally reviewed following labs and imaging studies  CBC: Recent Labs  Lab 12/12/20 0335  WBC 9.1  NEUTROABS 6.7  HGB 11.9*  HCT 34.8*  MCV 90.9  PLT 0000000   Basic Metabolic Panel:  Recent Labs  Lab 12/12/20 0634  NA 135  K 4.0  CL 98  CO2 26  GLUCOSE 144*  BUN 10  CREATININE 0.75  CALCIUM 9.3   GFR: Estimated Creatinine Clearance: 49.5 mL/min (by C-G formula based on SCr of 0.75 mg/dL). Liver Function Tests: Recent Labs  Lab 12/12/20 0634  AST 25  ALT 20  ALKPHOS 62  BILITOT 0.9  PROT 6.3*  ALBUMIN 3.1*   No results for input(s): LIPASE, AMYLASE in the last 168 hours. Recent Labs  Lab 12/12/20 0634  AMMONIA 19   Coagulation Profile: No results for input(s): INR, PROTIME in the last 168  hours. Cardiac Enzymes: No results for input(s): CKTOTAL, CKMB, CKMBINDEX, TROPONINI in the last 168 hours. BNP (last 3 results) No results for input(s): PROBNP in the last 8760 hours. HbA1C: No results for input(s): HGBA1C in the last 72 hours. CBG: No results for input(s): GLUCAP in the last 168 hours. Lipid Profile: No results for input(s): CHOL, HDL, LDLCALC, TRIG, CHOLHDL, LDLDIRECT in the last 72 hours. Thyroid Function Tests: No results for input(s): TSH, T4TOTAL, FREET4, T3FREE, THYROIDAB in the last 72 hours. Anemia Panel: Recent Labs    12/12/20 0634  VITAMINB12 826   Sepsis Labs: No results for input(s): PROCALCITON, LATICACIDVEN in the last 168 hours.  Recent Results (from the past 240 hour(s))  SARS CORONAVIRUS 2 (TAT 6-24 HRS) Nasopharyngeal Nasopharyngeal Swab     Status: None   Collection Time: 12/04/20 10:48 PM   Specimen: Nasopharyngeal Swab  Result Value Ref Range Status   SARS Coronavirus 2 NEGATIVE NEGATIVE Final    Comment: (NOTE) SARS-CoV-2 target nucleic acids are NOT DETECTED.  The SARS-CoV-2 RNA is generally detectable in upper and lower respiratory specimens during the acute phase of infection. Negative results do not preclude SARS-CoV-2 infection, do not rule out co-infections with other pathogens, and should not be used as the sole basis for treatment or other patient management decisions. Negative results must be combined with clinical observations, patient history, and epidemiological information. The expected result is Negative.  Fact Sheet for Patients: SugarRoll.be  Fact Sheet for Healthcare Providers: https://www.woods-mathews.com/  This test is not yet approved or cleared by the [place] FDA and  has been authorized for detection and/or diagnosis of SARS-CoV-2 by FDA under an Emergency Use Authorization (EUA). This EUA will remain  in effect (meaning this test can be used) for the  duration of the COVID-19 declaration under Se ction 564(b)(1) of the Act, 21 U.S.C. section 360bbb-3(b)(1), unless the authorization is terminated or revoked sooner.  Performed at [facility], [place] [address] [place], [place] [address]   SARS CORONAVIRUS 2 (TAT 6-24 HRS)     Status: None   Collection Time: 12/08/20  1:26 PM  Result Value Ref Range Status   SARS Coronavirus 2 NEGATIVE NEGATIVE Final    Comment: (NOTE) SARS-CoV-2 target nucleic acids are NOT DETECTED.  The SARS-CoV-2 RNA is generally detectable in upper and lower respiratory specimens during the acute phase of infection. Negative results do not preclude SARS-CoV-2 infection, do not rule out co-infections with other pathogens, and should not be used as the sole basis for treatment or other patient management decisions. Negative results must be combined with clinical observations, patient history, and epidemiological information. The expected result is Negative.  Fact Sheet for Patients: SugarRoll.be  Fact Sheet for Healthcare Providers: https://www.woods-mathews.com/  This test is not yet approved or cleared by the [place] FDA and  has  been authorized for detection and/or diagnosis of SARS-CoV-2 by FDA under an Emergency Use Authorization (EUA). This EUA will remain  in effect (meaning this test can be used) for the duration of the COVID-19 declaration under Se ction 564(b)(1) of the Act, 21 U.S.C. section 360bbb-3(b)(1), unless the authorization is terminated or revoked sooner.  Performed at [facility], [place] [address], [place], [place] [address]   Resp Panel by RT-PCR (Flu A&B, Covid) Nasopharyngeal Swab     Status: None   Collection Time: 12/10/20  2:27 PM   Specimen: Nasopharyngeal Swab; Nasopharyngeal(NP) swabs in vial transport medium  Result Value Ref Range Status   SARS Coronavirus 2 by RT PCR NEGATIVE NEGATIVE Final    Comment:  (NOTE) SARS-CoV-2 target nucleic acids are NOT DETECTED.  The SARS-CoV-2 RNA is generally detectable in upper respiratory specimens during the acute phase of infection. The lowest concentration of SARS-CoV-2 viral copies this assay can detect is 138 copies/mL. A negative result does not preclude SARS-Cov-2 infection and should not be used as the sole basis for treatment or other patient management decisions. A negative result may occur with  improper specimen collection/handling, submission of specimen other than nasopharyngeal swab, presence of viral mutation(s) within the areas targeted by this assay, and inadequate number of viral copies(<138 copies/mL). A negative result must be combined with clinical observations, patient history, and epidemiological information. The expected result is Negative.  Fact Sheet for Patients:  EntrepreneurPulse.com.au  Fact Sheet for Healthcare Providers:  IncredibleEmployment.be  This test is no t yet approved or cleared by the [place] FDA and  has been authorized for detection and/or diagnosis of SARS-CoV-2 by FDA under an Emergency Use Authorization (EUA). This EUA will remain  in effect (meaning this test can be used) for the duration of the COVID-19 declaration under Section 564(b)(1) of the Act, 21 U.S.C.section 360bbb-3(b)(1), unless the authorization is terminated  or revoked sooner.       Influenza A by PCR NEGATIVE NEGATIVE Final   Influenza B by PCR NEGATIVE NEGATIVE Final    Comment: (NOTE) The Xpert Xpress SARS-CoV-2/FLU/RSV plus assay is intended as an aid in the diagnosis of influenza from Nasopharyngeal swab specimens and should not be used as a sole basis for treatment. Nasal washings and aspirates are unacceptable for Xpert Xpress SARS-CoV-2/FLU/RSV testing.  Fact Sheet for Patients: EntrepreneurPulse.com.au  Fact Sheet for Healthcare  Providers: IncredibleEmployment.be  This test is not yet approved or cleared by the [place] FDA and has been authorized for detection and/or diagnosis of SARS-CoV-2 by FDA under an Emergency Use Authorization (EUA). This EUA will remain in effect (meaning this test can be used) for the duration of the COVID-19 declaration under Section 564(b)(1) of the Act, 21 U.S.C. section 360bbb-3(b)(1), unless the authorization is terminated or revoked.  Performed at [facility], [place] [address] [place], [place] [address]   SARS CORONAVIRUS 2 (TAT 6-24 HRS) Nasopharyngeal Nasopharyngeal Swab     Status: None   Collection Time: 12/12/20  3:04 PM   Specimen: Nasopharyngeal Swab  Result Value Ref Range Status   SARS Coronavirus 2 NEGATIVE NEGATIVE Final    Comment: (NOTE) SARS-CoV-2 target nucleic acids are NOT DETECTED.  The SARS-CoV-2 RNA is generally detectable in upper and lower respiratory specimens during the acute phase of infection. Negative results do not preclude SARS-CoV-2 infection, do not rule out co-infections with other pathogens, and should not be used as the sole basis for treatment or other patient  management decisions. Negative results must be combined with clinical observations, patient history, and epidemiological information. The expected result is Negative.  Fact Sheet for Patients: SugarRoll.be  Fact Sheet for Healthcare Providers: https://www.woods-mathews.com/  This test is not yet approved or cleared by the [place] FDA and  has been authorized for detection and/or diagnosis of SARS-CoV-2 by FDA under an Emergency Use Authorization (EUA). This EUA will remain  in effect (meaning this test can be used) for the duration of the COVID-19 declaration under Se ction 564(b)(1) of the Act, 21 U.S.C. section 360bbb-3(b)(1), unless the authorization is terminated or revoked sooner.  Performed at  [facility], [place] [address] [place], [place] [address]    Radiology Studies: DG Chest Port 1 View  Result Date: 12/11/2020 CLINICAL DATA:  Altered mental status. EXAM: PORTABLE CHEST 1 VIEW COMPARISON:  Chest x-ray 09/30/2020. FINDINGS: There is no focal lung consolidation, pleural effusion or pneumothorax. There is a nodular density projecting over the right lower lung which may represent a nipple shadow. The cardiomediastinal silhouette is within normal limits. There are healed left-sided rib fractures. No acute fractures are seen. IMPRESSION: 1. No evidence for pneumonia or edema. 2. Nodular density projecting over the right lower lung favored is a nipple shadow. This can be confirmed with repeat study with nipple markers to exclude pulmonary nodule. Electronically Signed   By: Ronney Asters M.D.   On: 12/11/2020 20:37    Scheduled Meds:  atorvastatin  40 mg Oral QHS   busPIRone  10 mg Oral BID   docusate sodium  100 mg Oral BID   feeding supplement  237 mL Oral TID BM   ferrous sulfate  325 mg Oral BID WC   folic acid  1 mg Oral Daily   heparin injection (subcutaneous)  5,000 Units Subcutaneous Q8H   levETIRAcetam  500 mg Oral BID   mometasone-formoterol  2 puff Inhalation BID   multivitamin with minerals  1 tablet Oral Daily   OLANZapine  2.5 mg Oral QHS   pantoprazole  40 mg Oral Daily   QUEtiapine  200 mg Oral QHS   QUEtiapine  50 mg Oral Daily   spironolactone  25 mg Oral Daily   thiamine  100 mg Oral Daily   Continuous Infusions:  sodium chloride Stopped (12/02/20 1550)     LOS: 12 days    Time spent: 25 mins    Arnesia Vincelette, MD [facility]   If 7PM-7AM, please contact night-coverage

## 2020-12-13 NOTE — Plan of Care (Signed)

## 2020-12-15 NOTE — Telephone Encounter (Signed)
Checked to see if pt's appt had been able to be rescheduled and I saw that it looked like it was not able to.  Pt was recently admitted to the hospital and was discharged home to SNF.  Pt's appt does need to be rescheduled whenever able to.

## 2021-01-19 NOTE — Telephone Encounter (Signed)
Did attempt to follow up with patient, phone rang then went to a busy tone.

## 2022-12-31 DEATH — deceased

## 2023-02-16 IMAGING — DX DG CHEST 2V
2 series · 2 of 2 positions shown · non-contrast
Comparison: 04/25/2020

CLINICAL DATA: Shortness of breath

EXAM:
CHEST - 2 VIEW

[chest pa]
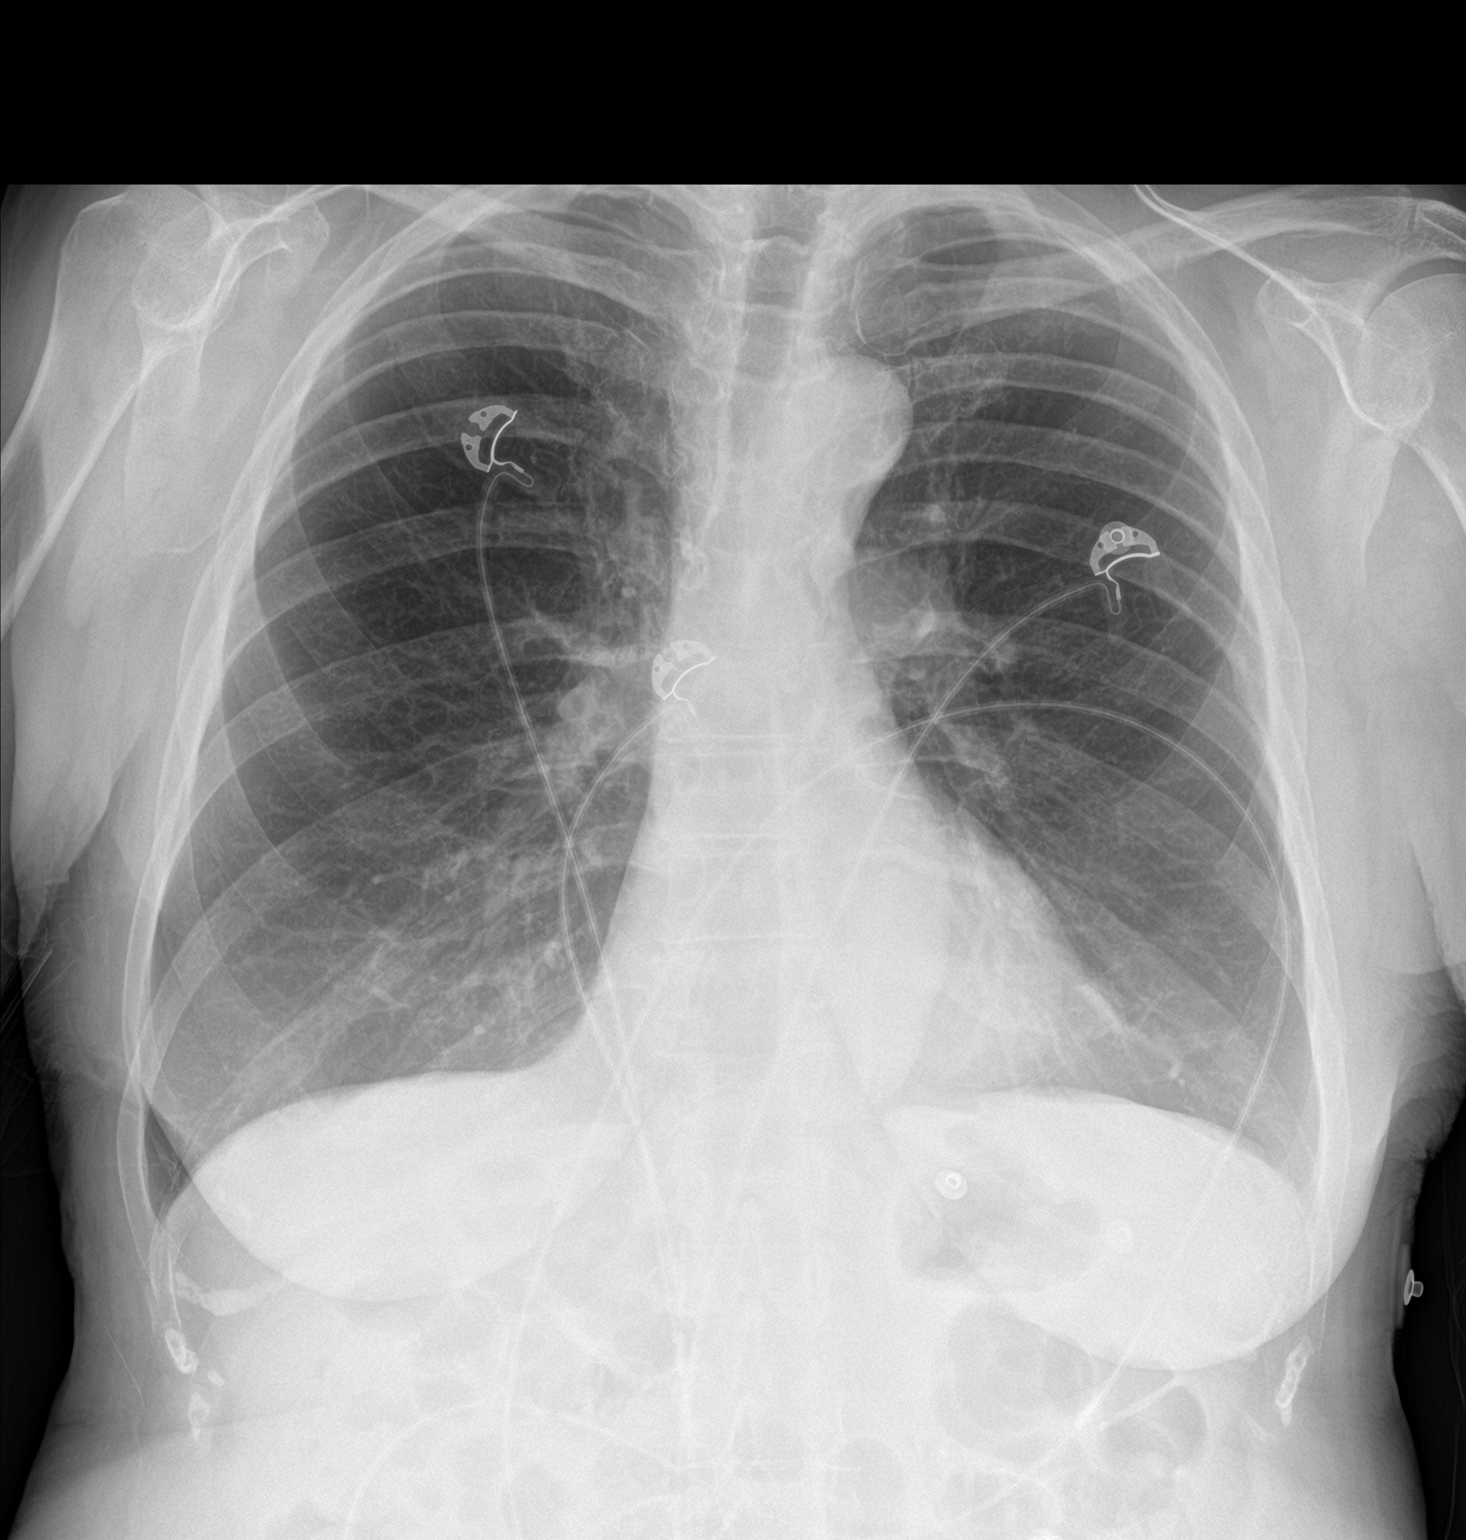

[chest lat]
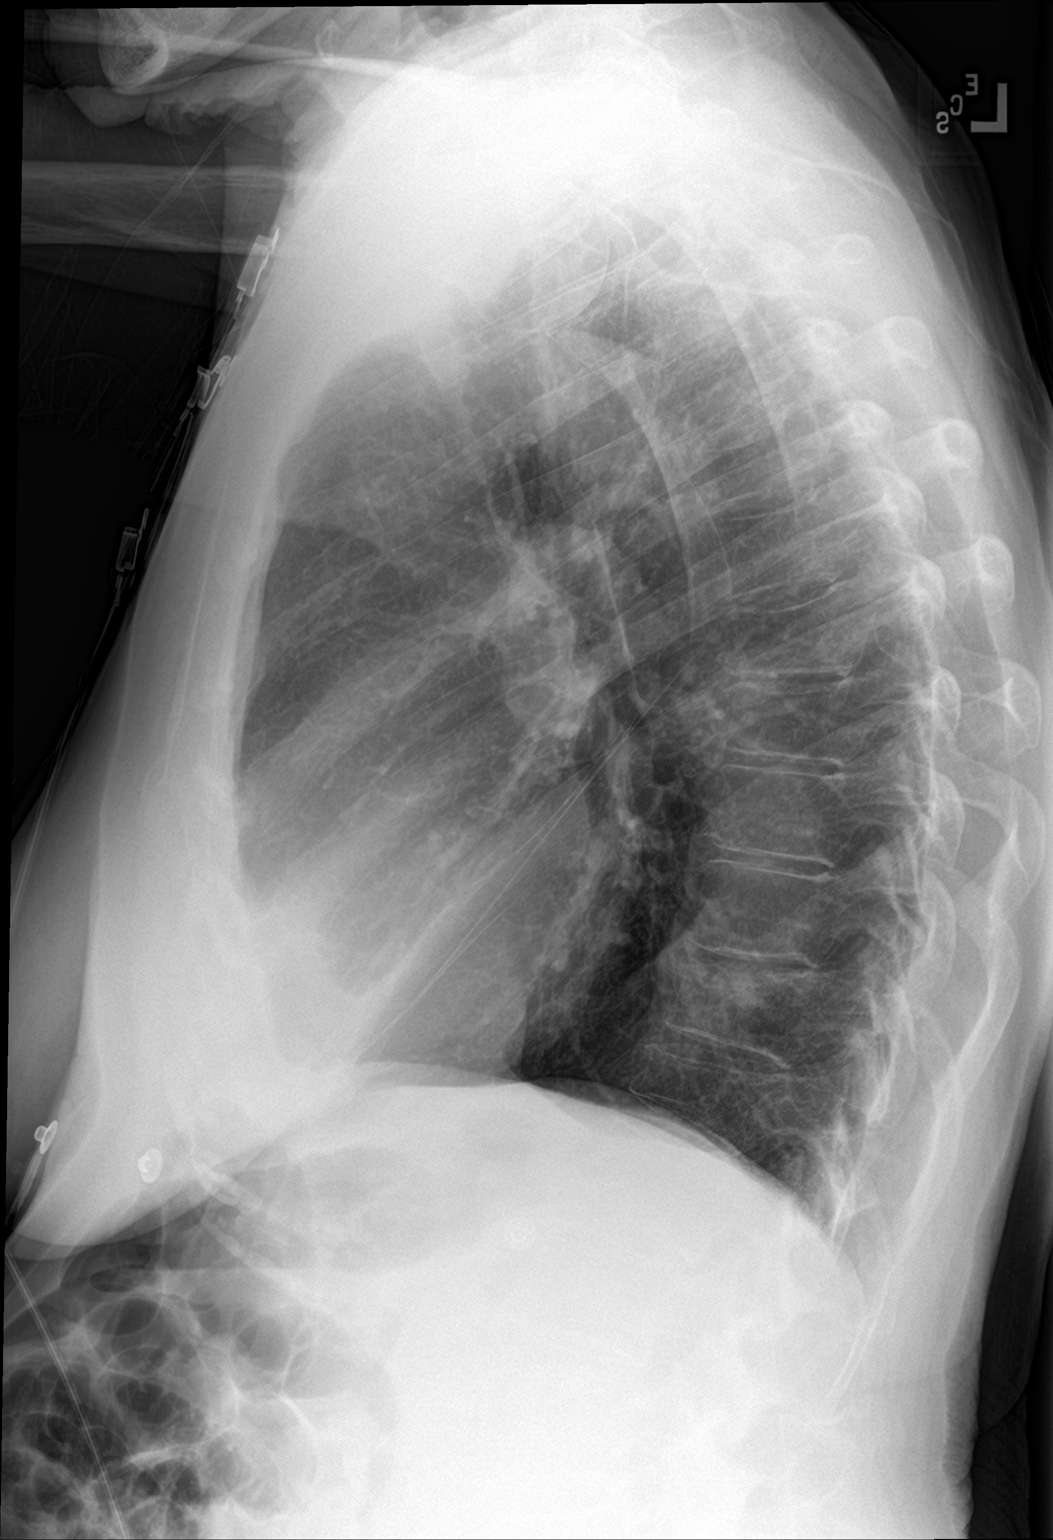

[2 of 2 positions shown; findings below may reference images not displayed]

FINDINGS: The heart size and mediastinal contours are within normal limits.
Both lungs are clear. The visualized skeletal structures are
unremarkable.
IMPRESSION: No active cardiopulmonary disease.

## 2023-03-20 IMAGING — CR DG CHEST 2V
2 series · 2 of 2 positions shown · non-contrast
Comparison: 08/29/2020

CLINICAL DATA: SHORTNESS OF BREATH. HEADACHE PAIN 5 DAYS HISTORY OF
COPD.

EXAM:
CHEST - 2 VIEW

[chest pa]
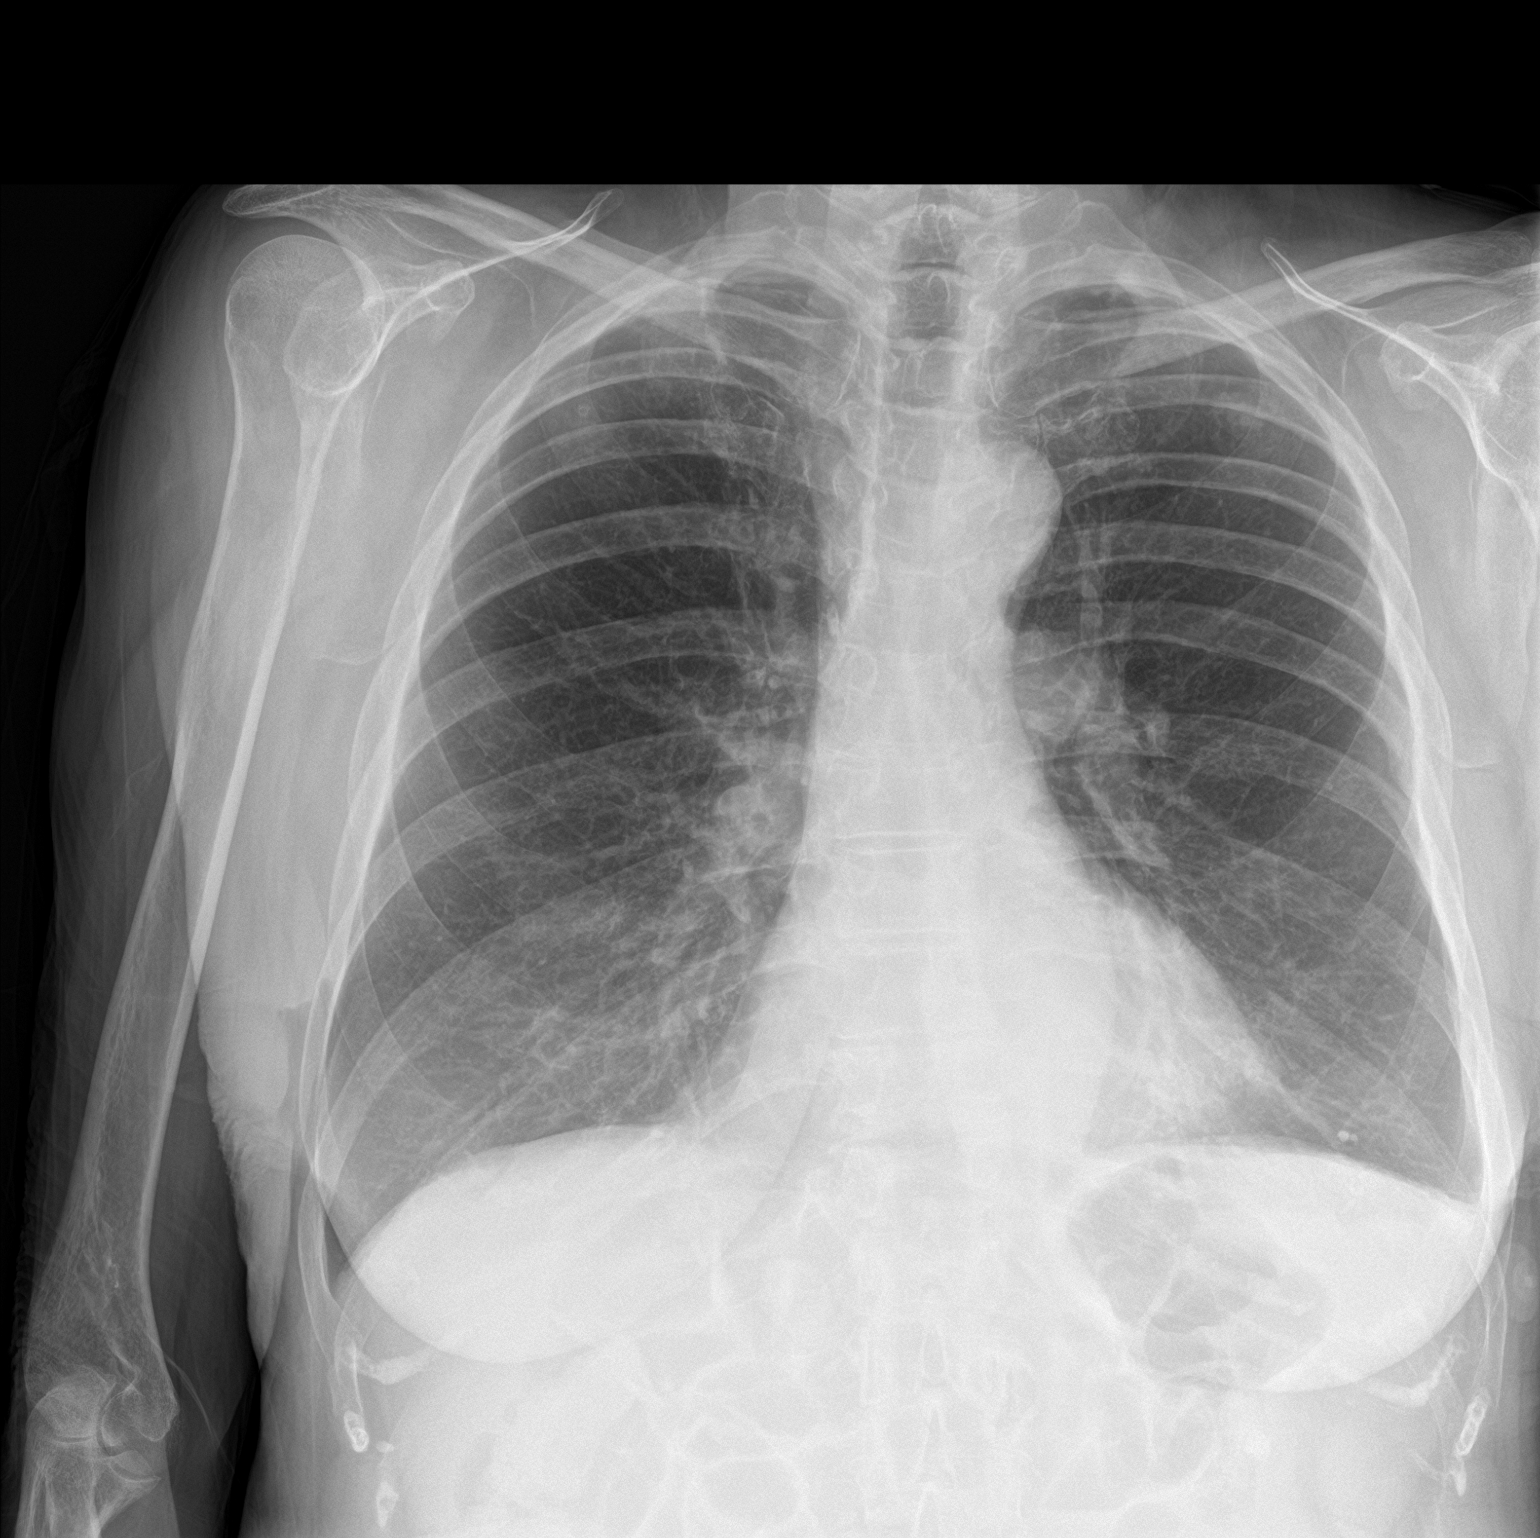

[chest lat]
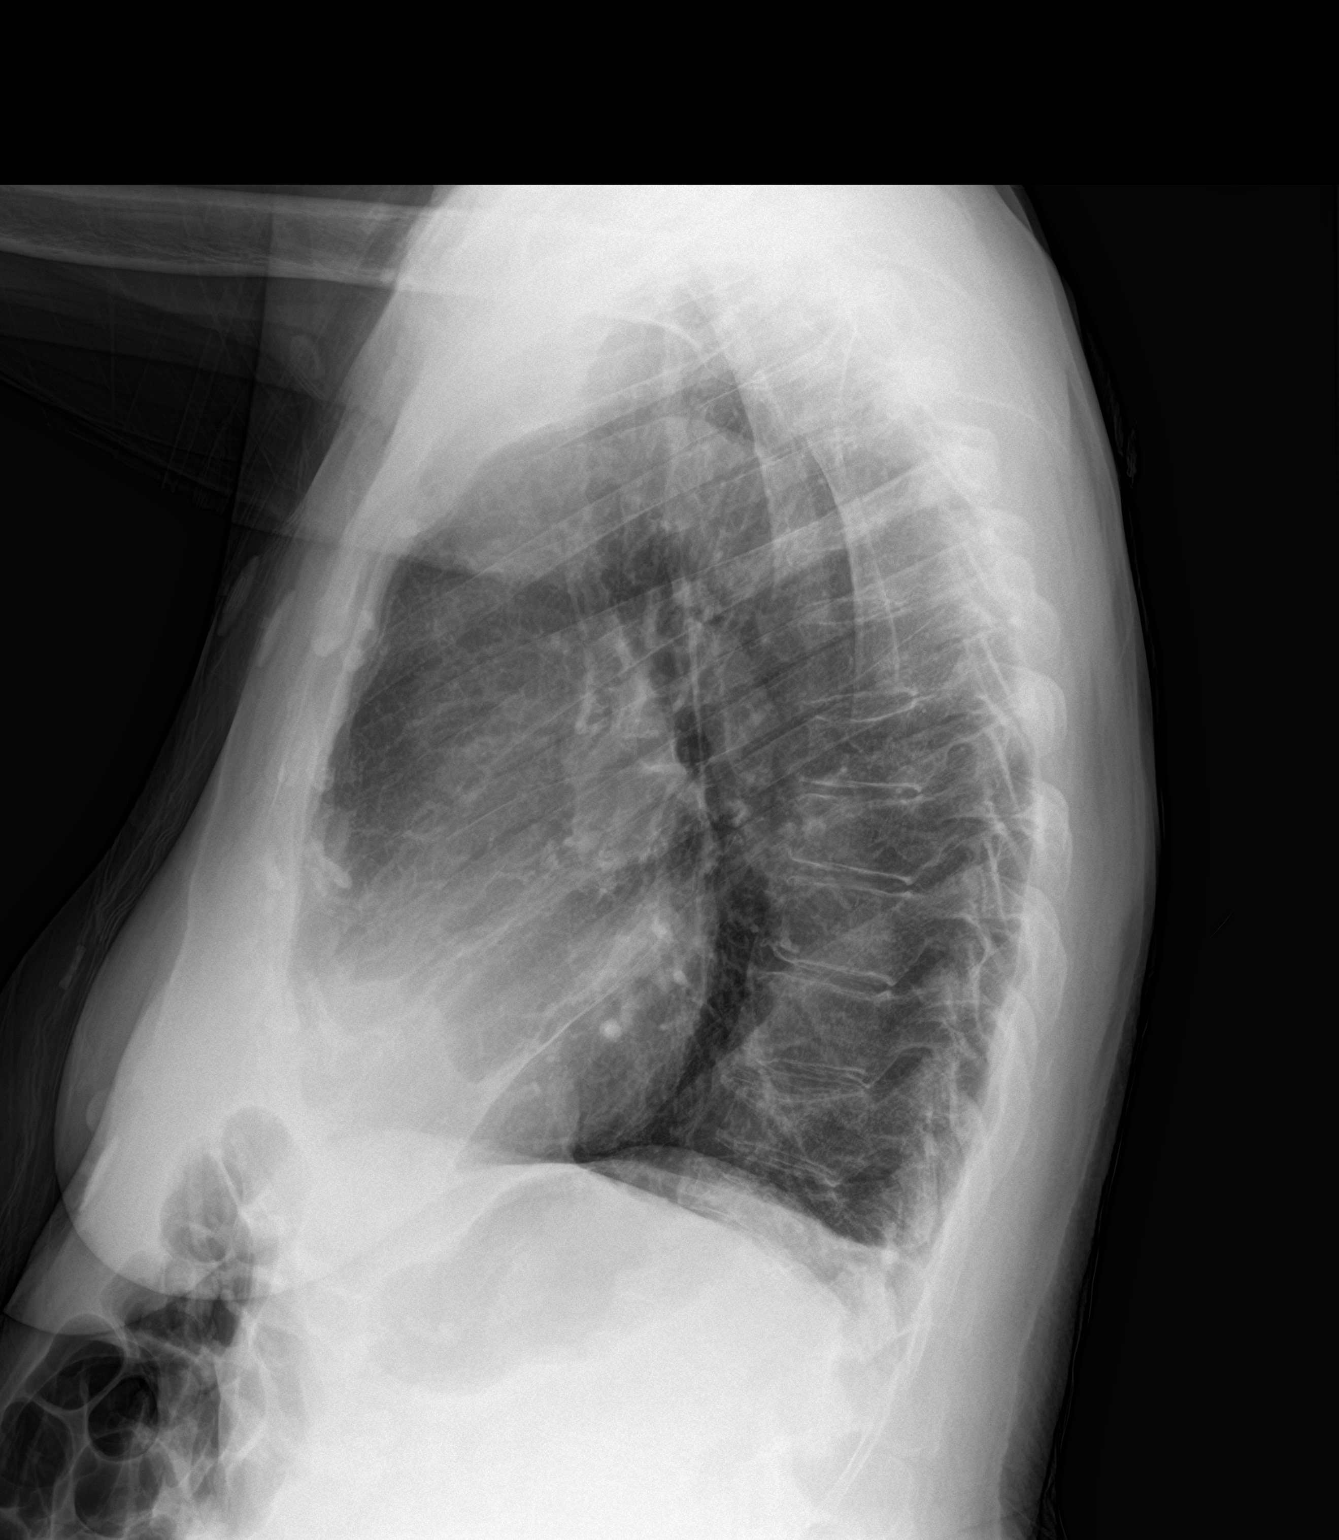

[2 of 2 positions shown; findings below may reference images not displayed]

FINDINGS: Lungs are mildly hyperinflated. Heart size is normal. There is a
small LEFT pleural effusion. No focal consolidations or pulmonary
edema. Numerous chronic thoracic wedge compression fractures.
IMPRESSION: Small LEFT effusion.
